# Patient Record
Sex: Female | Born: 1950
Health system: Southern US, Community
[De-identification: ages and names within clinical notes are randomized; demographics above are authoritative.]

## PROBLEM LIST (undated history)

## (undated) ENCOUNTER — Emergency Department (HOSPITAL_COMMUNITY): Discharge status: Absent without leave | Payer: Medicare Other

## (undated) DIAGNOSIS — F32A Depression, unspecified: Secondary | ICD-10-CM

## (undated) DIAGNOSIS — E119 Type 2 diabetes mellitus without complications: Secondary | ICD-10-CM

## (undated) DIAGNOSIS — F329 Major depressive disorder, single episode, unspecified: Secondary | ICD-10-CM

## (undated) DIAGNOSIS — R7989 Other specified abnormal findings of blood chemistry: Secondary | ICD-10-CM

## (undated) DIAGNOSIS — K589 Irritable bowel syndrome without diarrhea: Secondary | ICD-10-CM

## (undated) DIAGNOSIS — E785 Hyperlipidemia, unspecified: Secondary | ICD-10-CM

## (undated) DIAGNOSIS — O24919 Unspecified diabetes mellitus in pregnancy, unspecified trimester: Secondary | ICD-10-CM

## (undated) DIAGNOSIS — F319 Bipolar disorder, unspecified: Secondary | ICD-10-CM

## (undated) DIAGNOSIS — R945 Abnormal results of liver function studies: Secondary | ICD-10-CM

## (undated) DIAGNOSIS — I10 Essential (primary) hypertension: Secondary | ICD-10-CM

## (undated) DIAGNOSIS — F419 Anxiety disorder, unspecified: Secondary | ICD-10-CM

## (undated) DIAGNOSIS — D649 Anemia, unspecified: Secondary | ICD-10-CM

## (undated) DIAGNOSIS — K219 Gastro-esophageal reflux disease without esophagitis: Secondary | ICD-10-CM

## (undated) HISTORY — DX: Unspecified diabetes mellitus in pregnancy, unspecified trimester: O24.919

## (undated) HISTORY — DX: Hyperlipidemia, unspecified: E78.5

## (undated) HISTORY — DX: Gastro-esophageal reflux disease without esophagitis: K21.9

## (undated) HISTORY — DX: Bipolar disorder, unspecified: F31.9

## (undated) HISTORY — DX: Major depressive disorder, single episode, unspecified: F32.9

## (undated) HISTORY — DX: Anxiety disorder, unspecified: F41.9

## (undated) HISTORY — DX: Abnormal results of liver function studies: R94.5

## (undated) HISTORY — DX: Anemia, unspecified: D64.9

## (undated) HISTORY — PX: CHOLECYSTECTOMY: SHX55

## (undated) HISTORY — DX: Other specified abnormal findings of blood chemistry: R79.89

## (undated) HISTORY — DX: Irritable bowel syndrome, unspecified: K58.9

## (undated) HISTORY — DX: Depression, unspecified: F32.A

## (undated) HISTORY — DX: Essential (primary) hypertension: I10

---

## 1999-10-28 ENCOUNTER — Ambulatory Visit (HOSPITAL_COMMUNITY): Admission: RE | Admit: 1999-10-28 | Discharge: 1999-10-28 | Payer: Self-pay | Admitting: *Deleted

## 1999-12-05 ENCOUNTER — Encounter: Payer: Self-pay | Admitting: Family Medicine

## 1999-12-05 ENCOUNTER — Ambulatory Visit (HOSPITAL_COMMUNITY): Admission: RE | Admit: 1999-12-05 | Discharge: 1999-12-05 | Payer: Self-pay | Admitting: Family Medicine

## 2001-08-05 ENCOUNTER — Encounter: Admission: RE | Admit: 2001-08-05 | Discharge: 2001-11-03 | Payer: Self-pay | Admitting: Family Medicine

## 2001-09-06 ENCOUNTER — Encounter: Payer: Self-pay | Admitting: Family Medicine

## 2001-09-06 ENCOUNTER — Ambulatory Visit (HOSPITAL_COMMUNITY): Admission: RE | Admit: 2001-09-06 | Discharge: 2001-09-06 | Payer: Self-pay | Admitting: Family Medicine

## 2001-09-13 ENCOUNTER — Ambulatory Visit (HOSPITAL_COMMUNITY): Admission: RE | Admit: 2001-09-13 | Discharge: 2001-09-13 | Payer: Self-pay | Admitting: Internal Medicine

## 2001-09-13 ENCOUNTER — Encounter: Payer: Self-pay | Admitting: Internal Medicine

## 2001-09-23 ENCOUNTER — Encounter: Payer: Self-pay | Admitting: Internal Medicine

## 2001-09-23 ENCOUNTER — Ambulatory Visit (HOSPITAL_COMMUNITY): Admission: RE | Admit: 2001-09-23 | Discharge: 2001-09-23 | Payer: Self-pay | Admitting: Internal Medicine

## 2001-11-14 ENCOUNTER — Encounter: Payer: Self-pay | Admitting: Surgery

## 2001-11-18 ENCOUNTER — Encounter (INDEPENDENT_AMBULATORY_CARE_PROVIDER_SITE_OTHER): Payer: Self-pay | Admitting: *Deleted

## 2001-11-18 ENCOUNTER — Ambulatory Visit (HOSPITAL_COMMUNITY): Admission: RE | Admit: 2001-11-18 | Discharge: 2001-11-19 | Payer: Self-pay | Admitting: Surgery

## 2001-11-18 ENCOUNTER — Encounter: Payer: Self-pay | Admitting: Surgery

## 2002-03-14 ENCOUNTER — Ambulatory Visit (HOSPITAL_COMMUNITY): Admission: RE | Admit: 2002-03-14 | Discharge: 2002-03-14 | Payer: Self-pay | Admitting: *Deleted

## 2002-03-29 ENCOUNTER — Encounter: Payer: Self-pay | Admitting: Cardiology

## 2002-03-29 ENCOUNTER — Encounter: Admission: RE | Admit: 2002-03-29 | Discharge: 2002-03-29 | Payer: Self-pay | Admitting: Cardiology

## 2002-04-18 ENCOUNTER — Ambulatory Visit (HOSPITAL_BASED_OUTPATIENT_CLINIC_OR_DEPARTMENT_OTHER): Admission: RE | Admit: 2002-04-18 | Discharge: 2002-04-18 | Payer: Self-pay | Admitting: *Deleted

## 2002-04-19 ENCOUNTER — Ambulatory Visit (HOSPITAL_COMMUNITY): Admission: RE | Admit: 2002-04-19 | Discharge: 2002-04-19 | Payer: Self-pay | Admitting: *Deleted

## 2002-12-20 ENCOUNTER — Other Ambulatory Visit: Admission: RE | Admit: 2002-12-20 | Discharge: 2002-12-20 | Payer: Self-pay | Admitting: Obstetrics and Gynecology

## 2003-02-16 ENCOUNTER — Encounter: Payer: Self-pay | Admitting: Internal Medicine

## 2003-04-06 ENCOUNTER — Encounter (INDEPENDENT_AMBULATORY_CARE_PROVIDER_SITE_OTHER): Payer: Self-pay | Admitting: Specialist

## 2003-04-06 ENCOUNTER — Ambulatory Visit (HOSPITAL_COMMUNITY): Admission: RE | Admit: 2003-04-06 | Discharge: 2003-04-06 | Payer: Self-pay | Admitting: Internal Medicine

## 2003-04-06 ENCOUNTER — Encounter: Payer: Self-pay | Admitting: Internal Medicine

## 2003-08-27 ENCOUNTER — Encounter: Payer: Self-pay | Admitting: Emergency Medicine

## 2003-08-27 ENCOUNTER — Inpatient Hospital Stay (HOSPITAL_COMMUNITY): Admission: EM | Admit: 2003-08-27 | Discharge: 2003-08-30 | Payer: Self-pay | Admitting: Emergency Medicine

## 2003-08-27 ENCOUNTER — Encounter: Payer: Self-pay | Admitting: Internal Medicine

## 2003-11-17 HISTORY — PX: GASTRIC BYPASS: SHX52

## 2004-05-03 ENCOUNTER — Ambulatory Visit (HOSPITAL_COMMUNITY): Admission: RE | Admit: 2004-05-03 | Discharge: 2004-05-03 | Payer: Self-pay | Admitting: Hematology & Oncology

## 2004-06-04 ENCOUNTER — Ambulatory Visit (HOSPITAL_COMMUNITY): Admission: RE | Admit: 2004-06-04 | Discharge: 2004-06-04 | Payer: Self-pay | Admitting: Neurology

## 2004-10-23 ENCOUNTER — Ambulatory Visit (HOSPITAL_COMMUNITY): Admission: RE | Admit: 2004-10-23 | Discharge: 2004-10-23 | Payer: Self-pay | Admitting: Family Medicine

## 2004-12-16 ENCOUNTER — Other Ambulatory Visit: Admission: RE | Admit: 2004-12-16 | Discharge: 2004-12-16 | Payer: Self-pay | Admitting: *Deleted

## 2005-11-27 ENCOUNTER — Ambulatory Visit: Payer: Self-pay | Admitting: Cardiology

## 2006-01-01 ENCOUNTER — Ambulatory Visit (HOSPITAL_COMMUNITY): Admission: RE | Admit: 2006-01-01 | Discharge: 2006-01-01 | Payer: Self-pay | Admitting: Neurology

## 2006-03-16 ENCOUNTER — Ambulatory Visit: Payer: Self-pay | Admitting: Hematology & Oncology

## 2006-04-09 LAB — CBC & DIFF AND RETIC
BASO%: 0.8 % (ref 0.0–2.0)
Basophils Absolute: 0.1 10*3/uL (ref 0.0–0.1)
EOS%: 2.1 % (ref 0.0–7.0)
Eosinophils Absolute: 0.2 10*3/uL (ref 0.0–0.5)
HCT: 33.6 % — ABNORMAL LOW (ref 34.8–46.6)
HGB: 10.8 g/dL — ABNORMAL LOW (ref 11.6–15.9)
IRF: 0.38 — ABNORMAL HIGH (ref 0.130–0.330)
LYMPH%: 22.2 % (ref 14.0–48.0)
MCH: 25.7 pg — ABNORMAL LOW (ref 26.0–34.0)
MCHC: 32.2 g/dL (ref 32.0–36.0)
MCV: 79.9 fL — ABNORMAL LOW (ref 81.0–101.0)
MONO#: 0.5 10*3/uL (ref 0.1–0.9)
MONO%: 4.8 % (ref 0.0–13.0)
NEUT#: 7 10*3/uL — ABNORMAL HIGH (ref 1.5–6.5)
NEUT%: 70.1 % (ref 39.6–76.8)
Platelets: 454 10*3/uL — ABNORMAL HIGH (ref 145–400)
RBC: 4.21 10*6/uL (ref 3.70–5.32)
RDW: 23.5 % — ABNORMAL HIGH (ref 11.3–14.5)
RETIC #: 73.3 10*3/uL (ref 19.7–115.1)
Retic %: 1.7 % (ref 0.4–2.3)
WBC: 10 10*3/uL (ref 3.9–10.0)
lymph#: 2.2 10*3/uL (ref 0.9–3.3)

## 2006-04-09 LAB — CHCC SMEAR

## 2006-04-12 LAB — TRANSFERRIN RECEPTOR, SOLUABLE: Transferrin Receptor, Soluble: 9.3 mg/L — ABNORMAL HIGH (ref 1.9–4.4)

## 2006-04-12 LAB — FERRITIN: Ferritin: 259 ng/mL (ref 10–291)

## 2006-05-13 ENCOUNTER — Ambulatory Visit: Payer: Self-pay | Admitting: Hematology & Oncology

## 2006-05-17 ENCOUNTER — Ambulatory Visit (HOSPITAL_COMMUNITY): Admission: RE | Admit: 2006-05-17 | Discharge: 2006-05-17 | Payer: Self-pay | Admitting: Neurology

## 2006-05-21 LAB — CBC WITH DIFFERENTIAL/PLATELET
BASO%: 0.5 % (ref 0.0–2.0)
Basophils Absolute: 0.1 10*3/uL (ref 0.0–0.1)
EOS%: 2.3 % (ref 0.0–7.0)
Eosinophils Absolute: 0.2 10*3/uL (ref 0.0–0.5)
HCT: 35.5 % (ref 34.8–46.6)
HGB: 11.7 g/dL (ref 11.6–15.9)
LYMPH%: 17.3 % (ref 14.0–48.0)
MCH: 27.3 pg (ref 26.0–34.0)
MCHC: 32.9 g/dL (ref 32.0–36.0)
MCV: 82.8 fL (ref 81.0–101.0)
MONO#: 0.6 10*3/uL (ref 0.1–0.9)
MONO%: 5.3 % (ref 0.0–13.0)
NEUT#: 7.9 10*3/uL — ABNORMAL HIGH (ref 1.5–6.5)
NEUT%: 74.6 % (ref 39.6–76.8)
Platelets: 352 10*3/uL (ref 145–400)
RBC: 4.29 10*6/uL (ref 3.70–5.32)
RDW: 19.3 % — ABNORMAL HIGH (ref 11.3–14.5)
WBC: 10.6 10*3/uL — ABNORMAL HIGH (ref 3.9–10.0)
lymph#: 1.8 10*3/uL (ref 0.9–3.3)

## 2006-05-21 LAB — CHCC SMEAR

## 2006-05-24 LAB — FERRITIN: Ferritin: 166 ng/mL (ref 10–291)

## 2006-05-24 LAB — TRANSFERRIN RECEPTOR, SOLUABLE: Transferrin Receptor, Soluble: 4.8 mg/L — ABNORMAL HIGH (ref 1.9–4.4)

## 2006-06-21 LAB — CBC WITH DIFFERENTIAL/PLATELET
BASO%: 0.4 % (ref 0.0–2.0)
Basophils Absolute: 0 10*3/uL (ref 0.0–0.1)
EOS%: 3.6 % (ref 0.0–7.0)
Eosinophils Absolute: 0.3 10*3/uL (ref 0.0–0.5)
HCT: 35.7 % (ref 34.8–46.6)
HGB: 11.8 g/dL (ref 11.6–15.9)
LYMPH%: 24.1 % (ref 14.0–48.0)
MCH: 27.9 pg (ref 26.0–34.0)
MCHC: 33.1 g/dL (ref 32.0–36.0)
MCV: 84.4 fL (ref 81.0–101.0)
MONO#: 0.5 10*3/uL (ref 0.1–0.9)
MONO%: 5.6 % (ref 0.0–13.0)
NEUT#: 6.4 10*3/uL (ref 1.5–6.5)
NEUT%: 66.3 % (ref 39.6–76.8)
Platelets: 353 10*3/uL (ref 145–400)
RBC: 4.23 10*6/uL (ref 3.70–5.32)
RDW: 14.6 % — ABNORMAL HIGH (ref 11.3–14.5)
WBC: 9.6 10*3/uL (ref 3.9–10.0)
lymph#: 2.3 10*3/uL (ref 0.9–3.3)

## 2006-06-21 LAB — CHCC SMEAR

## 2006-06-23 LAB — FERRITIN: Ferritin: 89 ng/mL (ref 10–291)

## 2006-06-23 LAB — TRANSFERRIN RECEPTOR, SOLUABLE: Transferrin Receptor, Soluble: 5.1 mg/L — ABNORMAL HIGH (ref 1.9–4.4)

## 2006-06-30 LAB — UIFE/LIGHT CHAINS/TP QN, 24-HR UR
Albumin, U: DETECTED
Free Kappa Lt Chains,Ur: 2.7 mg/dL — ABNORMAL HIGH (ref 0.04–1.51)
Free Kappa/Lambda Ratio: 10.8 ratio — ABNORMAL HIGH (ref 0.46–4.00)
Free Lambda Excretion/Day: 6.25 mg/d
Free Lambda Lt Chains,Ur: 0.25 mg/dL (ref 0.08–1.01)
Free Lt Chn Excr Rate: 67.5 mg/d
Time: 24 hours
Total Protein, Urine-Ur/day: 80 mg/d (ref 10–140)
Total Protein, Urine: 3.2 mg/dL
Volume, Urine: 2500 mL

## 2006-06-30 LAB — PORPHYRINS, FRACTIONATED URINE (TIMED COLLECTION)
Coproporphyrin, U: 3 (ref 0–22)
Creatinine, Urine mg/day-PORPF: 1150 mg/d (ref 500–1400)
Creatinine, Urine-mg/dL-PORPF: 46 mg/dL
Heptacarboxyl Porphyrin, 24H Ur: 0 (ref 0–2)
Porphyrins Interpretation: NORMAL
Time-PORPF: 24 hr
Total Volume, Urine: 2500 mL
Uroporphyrin, Urine: 1 (ref 0–4)

## 2006-07-20 ENCOUNTER — Ambulatory Visit: Payer: Self-pay | Admitting: Hematology & Oncology

## 2006-07-30 ENCOUNTER — Ambulatory Visit (HOSPITAL_COMMUNITY): Admission: RE | Admit: 2006-07-30 | Discharge: 2006-07-30 | Payer: Self-pay | Admitting: Hematology & Oncology

## 2006-07-30 ENCOUNTER — Ambulatory Visit: Payer: Self-pay | Admitting: Hematology & Oncology

## 2006-07-30 ENCOUNTER — Encounter (INDEPENDENT_AMBULATORY_CARE_PROVIDER_SITE_OTHER): Payer: Self-pay | Admitting: Specialist

## 2006-08-30 ENCOUNTER — Emergency Department (HOSPITAL_COMMUNITY): Admission: EM | Admit: 2006-08-30 | Discharge: 2006-08-30 | Payer: Self-pay | Admitting: Emergency Medicine

## 2006-09-16 ENCOUNTER — Ambulatory Visit: Payer: Self-pay | Admitting: Hematology & Oncology

## 2006-09-24 LAB — CBC & DIFF AND RETIC
BASO%: 0.4 % (ref 0.0–2.0)
Basophils Absolute: 0 10*3/uL (ref 0.0–0.1)
EOS%: 2.5 % (ref 0.0–7.0)
Eosinophils Absolute: 0.2 10*3/uL (ref 0.0–0.5)
HCT: 36.1 % (ref 34.8–46.6)
HGB: 11.9 g/dL (ref 11.6–15.9)
IRF: 0.32 (ref 0.130–0.330)
LYMPH%: 14.1 % (ref 14.0–48.0)
MCH: 28.6 pg (ref 26.0–34.0)
MCHC: 33 g/dL (ref 32.0–36.0)
MCV: 87 fL (ref 81.0–101.0)
MONO#: 0.5 10*3/uL (ref 0.1–0.9)
MONO%: 5.6 % (ref 0.0–13.0)
NEUT#: 6.4 10*3/uL (ref 1.5–6.5)
NEUT%: 77.4 % — ABNORMAL HIGH (ref 39.6–76.8)
Platelets: 292 10*3/uL (ref 145–400)
RBC: 4.15 10*6/uL (ref 3.70–5.32)
RDW: 12.8 % (ref 11.3–14.5)
RETIC #: 39.4 10*3/uL (ref 19.7–115.1)
Retic %: 1 % (ref 0.4–2.3)
WBC: 8.2 10*3/uL (ref 3.9–10.0)
lymph#: 1.2 10*3/uL (ref 0.9–3.3)

## 2006-09-24 LAB — CHCC SMEAR

## 2006-09-27 LAB — FERRITIN: Ferritin: 27 ng/mL (ref 10–291)

## 2006-09-27 LAB — TRANSFERRIN RECEPTOR, SOLUABLE: Transferrin Receptor, Soluble: 5.6 mg/L — ABNORMAL HIGH (ref 1.9–4.4)

## 2006-11-15 ENCOUNTER — Ambulatory Visit: Payer: Self-pay | Admitting: Hematology & Oncology

## 2007-04-21 ENCOUNTER — Ambulatory Visit: Payer: Self-pay | Admitting: Hematology & Oncology

## 2007-05-30 ENCOUNTER — Emergency Department (HOSPITAL_COMMUNITY): Admission: EM | Admit: 2007-05-30 | Discharge: 2007-05-30 | Payer: Self-pay | Admitting: Emergency Medicine

## 2007-06-16 ENCOUNTER — Encounter: Admission: RE | Admit: 2007-06-16 | Discharge: 2007-06-16 | Payer: Self-pay | Admitting: Family Medicine

## 2007-08-03 ENCOUNTER — Ambulatory Visit: Payer: Self-pay | Admitting: Hematology & Oncology

## 2007-09-21 ENCOUNTER — Ambulatory Visit: Payer: Self-pay | Admitting: Hematology & Oncology

## 2007-09-26 LAB — CBC WITH DIFFERENTIAL/PLATELET
BASO%: 0.2 % (ref 0.0–2.0)
Basophils Absolute: 0 10*3/uL (ref 0.0–0.1)
EOS%: 1.3 % (ref 0.0–7.0)
Eosinophils Absolute: 0.1 10*3/uL (ref 0.0–0.5)
HCT: 24.8 % — ABNORMAL LOW (ref 34.8–46.6)
HGB: 7.8 g/dL — ABNORMAL LOW (ref 11.6–15.9)
LYMPH%: 16.4 % (ref 14.0–48.0)
MCH: 20.1 pg — ABNORMAL LOW (ref 26.0–34.0)
MCHC: 31.2 g/dL — ABNORMAL LOW (ref 32.0–36.0)
MCV: 64.4 fL — ABNORMAL LOW (ref 81.0–101.0)
MONO#: 0.5 10*3/uL (ref 0.1–0.9)
MONO%: 5.4 % (ref 0.0–13.0)
NEUT#: 7.6 10*3/uL — ABNORMAL HIGH (ref 1.5–6.5)
NEUT%: 76.7 % (ref 39.6–76.8)
Platelets: 511 10*3/uL — ABNORMAL HIGH (ref 145–400)
RBC: 3.86 10*6/uL (ref 3.70–5.32)
RDW: 17.3 % — ABNORMAL HIGH (ref 11.3–14.5)
WBC: 9.9 10*3/uL (ref 3.9–10.0)
lymph#: 1.6 10*3/uL (ref 0.9–3.3)

## 2007-09-26 LAB — CHCC SMEAR

## 2007-09-28 LAB — TRANSFERRIN RECEPTOR, SOLUABLE: Transferrin Receptor, Soluble: 111.5 nmol/L

## 2007-09-28 LAB — FERRITIN: Ferritin: 1 ng/mL — ABNORMAL LOW (ref 10–291)

## 2007-10-05 LAB — KAPPA/LAMBDA LIGHT CHAINS
Kappa free light chain: 0.91 mg/dL (ref 0.33–1.94)
Kappa:Lambda Ratio: 0.59 (ref 0.26–1.65)
Lambda Free Lght Chn: 1.54 mg/dL (ref 0.57–2.63)

## 2007-10-17 LAB — VON WILLEBRAND PANEL
Factor-VIII Activity: 127 % (ref 75–150)
Ristocetin-Cofactor: 137 % (ref 50–150)
Von Willebrand Ag: 131 % normal (ref 61–164)

## 2007-11-08 ENCOUNTER — Ambulatory Visit: Payer: Self-pay | Admitting: Hematology & Oncology

## 2008-02-23 ENCOUNTER — Ambulatory Visit (HOSPITAL_COMMUNITY): Admission: RE | Admit: 2008-02-23 | Discharge: 2008-02-23 | Payer: Self-pay | Admitting: Family Medicine

## 2008-04-16 ENCOUNTER — Emergency Department (HOSPITAL_COMMUNITY): Admission: EM | Admit: 2008-04-16 | Discharge: 2008-04-16 | Payer: Self-pay | Admitting: Emergency Medicine

## 2008-08-02 ENCOUNTER — Ambulatory Visit: Payer: Self-pay | Admitting: Hematology & Oncology

## 2008-08-03 LAB — CBC WITH DIFFERENTIAL (CANCER CENTER ONLY)
BASO#: 0.1 10*3/uL (ref 0.0–0.2)
BASO%: 0.8 % (ref 0.0–2.0)
EOS%: 2.3 % (ref 0.0–7.0)
Eosinophils Absolute: 0.2 10*3/uL (ref 0.0–0.5)
HCT: 40.9 % (ref 34.8–46.6)
HGB: 13.6 g/dL (ref 11.6–15.9)
LYMPH#: 1.7 10*3/uL (ref 0.9–3.3)
LYMPH%: 15.7 % (ref 14.0–48.0)
MCH: 28.6 pg (ref 26.0–34.0)
MCHC: 33.1 g/dL (ref 32.0–36.0)
MCV: 86 fL (ref 81–101)
MONO#: 0.6 10*3/uL (ref 0.1–0.9)
MONO%: 5.5 % (ref 0.0–13.0)
NEUT#: 8 10*3/uL — ABNORMAL HIGH (ref 1.5–6.5)
NEUT%: 75.7 % (ref 39.6–80.0)
Platelets: 301 10*3/uL (ref 145–400)
RBC: 4.74 10*6/uL (ref 3.70–5.32)
RDW: 12.6 % (ref 10.5–14.6)
WBC: 10.6 10*3/uL — ABNORMAL HIGH (ref 3.9–10.0)

## 2008-08-03 LAB — CHCC SATELLITE - SMEAR

## 2008-08-07 LAB — FERRITIN: Ferritin: 118 ng/mL (ref 10–291)

## 2008-08-07 LAB — RETICULOCYTES (CHCC)
ABS Retic: 55.1 10*3/uL (ref 19.0–186.0)
RBC.: 4.59 MIL/uL (ref 3.87–5.11)
Retic Ct Pct: 1.2 % (ref 0.4–3.1)

## 2008-08-07 LAB — TRANSFERRIN RECEPTOR, SOLUABLE: Transferrin Receptor, Soluble: 34 nmol/L

## 2008-09-26 ENCOUNTER — Ambulatory Visit: Payer: Self-pay

## 2008-09-26 ENCOUNTER — Ambulatory Visit: Payer: Self-pay | Admitting: Cardiology

## 2009-04-01 ENCOUNTER — Emergency Department (HOSPITAL_COMMUNITY): Admission: EM | Admit: 2009-04-01 | Discharge: 2009-04-01 | Payer: Self-pay | Admitting: Emergency Medicine

## 2009-08-13 ENCOUNTER — Telehealth: Payer: Self-pay | Admitting: Internal Medicine

## 2009-08-15 DIAGNOSIS — E785 Hyperlipidemia, unspecified: Secondary | ICD-10-CM | POA: Insufficient documentation

## 2009-08-15 DIAGNOSIS — K5900 Constipation, unspecified: Secondary | ICD-10-CM | POA: Insufficient documentation

## 2009-08-15 DIAGNOSIS — E1169 Type 2 diabetes mellitus with other specified complication: Secondary | ICD-10-CM | POA: Insufficient documentation

## 2009-08-15 DIAGNOSIS — K589 Irritable bowel syndrome without diarrhea: Secondary | ICD-10-CM | POA: Insufficient documentation

## 2009-08-15 DIAGNOSIS — K219 Gastro-esophageal reflux disease without esophagitis: Secondary | ICD-10-CM | POA: Insufficient documentation

## 2009-08-15 DIAGNOSIS — I10 Essential (primary) hypertension: Secondary | ICD-10-CM | POA: Insufficient documentation

## 2009-08-26 ENCOUNTER — Ambulatory Visit: Payer: Self-pay | Admitting: Internal Medicine

## 2009-08-26 DIAGNOSIS — F3170 Bipolar disorder, currently in remission, most recent episode unspecified: Secondary | ICD-10-CM | POA: Insufficient documentation

## 2009-08-26 DIAGNOSIS — F411 Generalized anxiety disorder: Secondary | ICD-10-CM | POA: Insufficient documentation

## 2009-08-26 DIAGNOSIS — G43909 Migraine, unspecified, not intractable, without status migrainosus: Secondary | ICD-10-CM | POA: Insufficient documentation

## 2009-08-30 ENCOUNTER — Encounter: Payer: Self-pay | Admitting: Internal Medicine

## 2009-08-30 ENCOUNTER — Ambulatory Visit: Payer: Self-pay | Admitting: Internal Medicine

## 2009-09-05 ENCOUNTER — Encounter: Payer: Self-pay | Admitting: Internal Medicine

## 2010-03-31 ENCOUNTER — Emergency Department (HOSPITAL_COMMUNITY): Admission: EM | Admit: 2010-03-31 | Discharge: 2010-03-31 | Payer: Self-pay | Admitting: Emergency Medicine

## 2010-09-11 ENCOUNTER — Encounter: Admission: RE | Admit: 2010-09-11 | Discharge: 2010-09-11 | Payer: Self-pay | Admitting: Family Medicine

## 2010-12-07 ENCOUNTER — Encounter: Payer: Self-pay | Admitting: Family Medicine

## 2010-12-07 ENCOUNTER — Encounter: Payer: Self-pay | Admitting: Neurology

## 2010-12-18 NOTE — Miscellaneous (Signed)
Summary: Reglan and Nexium Rx  Clinical Lists Changes  Medications: Added new medication of NEXIUM 40 MG  CPDR (ESOMEPRAZOLE MAGNESIUM) 1 capsule each day 30 minutes before meal - Signed Added new medication of REGLAN 5 MG  TABS (METOCLOPRAMIDE HCL) take 1 tablet before each meal - Signed Rx of NEXIUM 40 MG  CPDR (ESOMEPRAZOLE MAGNESIUM) 1 capsule each day 30 minutes before meal;  #30 x 5;  Signed;  Entered by: Burman Foster RN;  Authorized by: Hart Carwin MD;  Method used: Electron[facility] to [facility] [phone]*, [address] [place], [place], [place]  [address], Ph: [phone] or [phone], Fax: [phone] Rx of REGLAN 5 MG  TABS (METOCLOPRAMIDE HCL) take 1 tablet before each meal;  #90 x 1;  Signed;  Entered by: Burman Foster RN;  Authorized by: Hart Carwin MD;  Method used: Electronically to [facility] [phone]*, [address], [place], [place], [place]  [address], Ph: [phone] or [phone], Fax: [phone]    Prescriptions: REGLAN 5 MG  TABS (METOCLOPRAMIDE HCL) take 1 tablet before each meal  #90 x 1   Entered by:   Burman Foster RN   Authorized by:   Hart Carwin MD   Signed by:   Burman Foster RN on 08/30/2009   Method used:   Electronically to        [facility] [phone]* (retail)       [address]       [place], [place]  [address]       Ph: [phone] or [phone]       Fax: [phone]   RxID:   [id number] NEXIUM 40 MG  CPDR (ESOMEPRAZOLE MAGNESIUM) 1 capsule each day 30 minutes before meal  #30 x 5   Entered by:   Burman Foster RN   Authorized by:   Hart Carwin MD   Signed by:   Burman Foster RN on 08/30/2009   Method used:   Electronically to        [facility] [phone]* (retail)       [address]       [place], [place]  [address]       Ph: [phone] or [phone]       Fax: [phone]   RxID:   [id number]

## 2010-12-18 NOTE — Progress Notes (Signed)
Summary: TRIAGE  Phone Note Call from Patient Call back at Home Phone [phone]   Caller: Patient Call For: Dr. Juanda Chance Reason for Call: Talk to Nurse Summary of Call: pt was last seen by Dr. Juanda Chance in 2004 per MedMgr... pt reporting dysphagia and regurgitation... would like to be seen asap Initial call taken by: Vallarie Mare,  August 13, 2009 3:21 PM  Follow-up for Phone Call        Pt. c/o immediate regurgitation after some meals, at least 4X weekly, also bloating and increased belching. Pt. takes MOM daily for constipation. Denies dysphagia, pain, bleeding, black stools. Had a Gastric Bypass in 2005.  Pt. will see Dr.Cristle Jared on 08-21-09 at 9am. Pt. instructed to call back as needed.  Follow-up by: Laureen Ochs LPN,  August 13, 2009 3:39 PM

## 2010-12-18 NOTE — Assessment & Plan Note (Signed)
Summary: REGURGITATION AFTER MEALS, CONSTIPATION         Erin Donaldson   History of Present Illness Visit Type: Initial Consult Primary GI MD: Erin Sar MD Primary Provider: Rudi Heap, MD Chief Complaint: Patient complains of mid abdominal pain that started about a year ago that comes and goes. Nausea and vomiting started around the same time patient states that it happens when she eats too much, if she eats more than a cup and a half of food.  History of Present Illness:   The patient is a 60 year old white female whom we have seen in the past for post-cholecystectomy diarrhea. We saw her prior to that for symptoms of upper abdominal pain and iron-deficiency anemia. An endoscopy was done in 2002 for evaluation of the anemia. That showed bile reflux. Patient also had a colonoscopy in May 2004 for evaluation of her diarrhea. The colonoscopy was normal to the cecum. Random biopsies were completed and benign colonic mucosa. There was no significant inflammation or other abnormalifies identified.she had a gastric stapling procedure in 2005 and did well oozing about 130 pounds. She has gained about 20 pounds back. Recently she was put on psychotropic medications for bipolar disorder. She has developed regurgitation of the food. Prilosec didn't seem to help   GI Review of Systems    Reports abdominal pain, bloating, nausea, and  vomiting.     Location of  Abdominal pain: generalized.     Reports change in bowel habits and  constipation.    Preventive Screening-Counseling & Management  Alcohol-Tobacco     Smoking Status: never      Drug Use:  no.      Current Medications (verified): 1)  Exelon 4.6 Mg/24hr Pt24 (Rivastigmine) .... Change Patch Daily 2)  Emsam 9 Mg/24hr Pt24 (Selegiline) .... Change Daily 3)  Lamotrigine 200 Mg Tabs (Lamotrigine) .... Take One By Mouth Once Daily 4)  Benztropine Mesylate 1 Mg Tabs (Benztropine Mesylate) .... Take One By Mouth Two Times A Day 5)  Topamax 50 Mg  Tabs (Topiramate) .... Take One By Mouth Two Times A Day 6)  Furosemide 20 Mg Tabs (Furosemide) .... Take One By Mouth Once Daily 7)  Hydroxyzine Hcl 10 Mg Tabs (Hydroxyzine Hcl) .... Take 1-2 By Mouth Three Times A Day 8)  Chlordiazepoxide Hcl 10 Mg Caps (Chlordiazepoxide Hcl) .... Take One By Mouth Three Times A Day 9)  Seroquel 200 Mg Tabs (Quetiapine Fumarate) .... Taje 2 Tabs By Mouth At Bedtime 10)  Gabapentin 300 Mg Caps (Gabapentin) .... Take 3 Caps By Mouth Three Times A Day 11)  Namenda 10 Mg Tabs (Memantine Hcl) .... Take One By Mouth Two Times A Day 12)  Maxalt-Mlt 10 Mg Tbdp (Rizatriptan Benzoate) .... Take One By Mouth Then Take One 30 Min Later If Headaches Are No Better 13)  Lithium Carbonate 150 Mg Caps (Lithium Carbonate) .... Take One By Mouth Once Daily  Allergies (verified): No Known Drug Allergies  Past History:  Past Medical History: Current Problems:  MIGRAINE HEADACHE (ICD-346.90) BIPOLAR AFFECTIVE DISORDER (ICD-296.80) ANXIETY (ICD-300.00) HYPERLIPIDEMIA (ICD-272.4) HYPERTENSION (ICD-401.9) GERD (ICD-530.81) DIABETES MELLITUS, TYPE II (ICD-250.00) IRRITABLE BOWEL SYNDROME (ICD-564.1) CONSTIPATION (ICD-564.00)  Past Surgical History: Cholecystectomy Gastric Bypass 2005  Family History: Family History of Kidney Disease: Father Family History of Heart Disease: Father, Mother, Maternal Grandfather, Brother, maternal auns and uncles, paternal aunts and uncles. Family History of Breast Cancer: Mother lymphnode under arm No FH of Colon Cancer: Family History of Diabetes: father, maternal aunts and uncles,  paternal aunts and uncles Family istory of Bladder Cancer: Mother Died  Social History: Alcohol Use - yes-rare Illicit Drug Use - no Daily Caffeine Use Patient has never smoked.  Smoking Status:  never  Review of Systems       The patient complains of anxiety-new, confusion, depression-new, headaches-new, and sleeping problems.  The patient denies  allergy/sinus, anemia, arthritis/joint pain, back pain, blood in urine, breast changes/lumps, change in vision, cough, coughing up blood, fainting, fatigue, fever, hearing problems, heart murmur, heart rhythm changes, itching, menstrual pain, muscle pains/cramps, night sweats, nosebleeds, pregnancy symptoms, shortness of breath, skin rash, sore throat, swelling of feet/legs, swollen lymph glands, thirst - excessive , urination - excessive , urination changes/pain, urine leakage, vision changes, and voice change.         Pertinent positive and negative review of systems were noted in the above HPI. All other ROS was otherwise negative.   Vital Signs:  Patient profile:   60 year old female Height:      64 inches Weight:      208 pounds BMI:     35.83 Pulse rate:   104 / minute Pulse rhythm:   regular BP sitting:   120 / 70  (left arm) Cuff size:   regular  Vitals Entered By: Erin Donaldson CMA Duncan Dull) (August 26, 2009 2:11 PM)  Physical Exam  General:  obese alert and oriented Eyes:  PERRLA, no icterus. Mouth:  dry small stone with cheilosis Neck:  Supple; no masses or thyromegaly. Lungs:  Clear throughout to auscultation. Heart:  Regular rate and rhythm; no murmurs, rubs,  or bruits. Abdomen:  large protuberant abdomen tender in epigastrium. Well-healed surgical scar. We'll abdomen unremarkable. No ascites. Bowel sounds normal active Neurologic:  Alert and  oriented x4;  grossly normal neurologically. Skin:  extensive ecchymosis of the forearms Psych:  Alert and cooperative. Normal mood and affect.   Impression & Recommendations:  Problem # 1:  GERD (ICD-530.81) status post gastric stapling procedure 5 years ago now with regurgitation of the food throughout gastroparesis. Rule out gastric outlet obstruction. Psychotropic medication may affect gastric emptying in an adverse way. We will start Nexium 40 mg daily and schedule patient for upper endoscopy. I would consider Reglan in the  future  Problem # 2:  BIPOLAR AFFECTIVE DISORDER (ICD-296.80) patient is some Namenda 10 mg twice a day headache medications, Exelon and Emsam  Patient Instructions: 1)  Nexium 40 mg daily 2)  Upper endoscopy 3)  Small frequent feedings and antireflux measures 4)  Consider adding Reglan 5)  Copy sent to : Dr Vernon Prey  Appended Document: REGURGITATION AFTER MEALS, CONSTIPATION         [facility]    Clinical Lists Changes  Orders: Added new Test order of EGD (EGD) - Signed

## 2010-12-18 NOTE — Procedures (Signed)
Summary: Upper Endoscopy  Patient: [name] Note: All result statuses are Final unless otherwise noted.  Tests: (1) Upper Endoscopy (EGD)   EGD Upper Endoscopy       DONE     [facility]     [address]     [place], [place]  [address]           OPERATIVE PROCEDURE REPORT           PATIENT:  Erin Donaldson, Erin Donaldson  MR#:  [id number]     BIRTHDATE:  04-28-1951  GENDER:  female           ENDOSCOPIST:  Hedwig Morton. Juanda Chance, MD     ASSISTANT:           PROCEDURE DATE:  08/30/2009     PRE-PROCEDURE PREPERATION:     PRE-PROCEDURE PHYSICAL:  Rudi Heap, MD     PROCEDURE:  EGD with biopsy     ASA CLASS:  Class II     INDICATIONS:  1) nausea and vomiting  2) abdominal pain s/p     gastric bypass 5 years ago, lost 136lbs, now unable to keep food     down, pt has been on several psychotropic medications for bipolar     disorder           MEDICATIONS:  Versed 10 mg, Fentanyl 100 mcg     TOPICAL ANESTHETIC:  Exactacain Spray           DESCRIPTION OF PROCEDURE:   After the risks benefits and     alternatives of the procedure were thoroughly explained, informed     consent was obtained.  The LB GIF-H180 [id number] endoscope was     introduced through the mouth and advanced to the proximal jejunum,     without limitations.  The instrument was slowly withdrawn as the     mucosa was fully examined.     <<PROCEDUREIMAGES>>           Post-operative change was noted (see image9, image7, and image4).     gasrojejunal bypass, gastric remnant 38 - 46 cm, 8 cm long, widely     open gastrojejunal anastomosis  An ulcer was found at the     anastomosis. shallow 5 mm linear ulcer at the anastomosis With     standard forceps, a biopsy was obtained and sent to pathology (see     image7, image8, and image9).  Otherwise the examination was     normal. jejunal biopsies to r/o villous atrophy With standard     forceps, a biopsy was obtained and sent to pathology (see image11,     image6, image5, image2,  and image1).    Retroflexed views revealed     no abnormalities.    The scope was then withdrawn from the patient     and the procedure terminated.           COMPLICATIONS:  None           IMPRESSION:  1) Post-operative change     2) Ulcer at the anastomosis     3) Otherwise normal examination     s/p gasrojejunal bypass, 8 cm gastric remnant, wide open     anastomosis     shallow anastomotic ulcer     RECOMMENDATIONS:  1) await pathology results     Nexiem 40 mg po qd     Reglan 5 mg po ac, #90, 1 refill  REPEAT EXAM:  In 0 year(s) for.     DISCHARGE INSTRUCTIONS:           ______________________________     Hedwig Morton. Juanda Chance, MD           CPT CODES:           DIAGNOSIS CODES:           CC:           n.     eSIGNED:   Daksha Koone M. Noela Brothers at 08/30/2009 10:55 AM           Page 2 of 3   Erin Donaldson, Erin Donaldson, [id number]  Note: An exclamation mark (!) indicates a result that was not dispersed into the flowsheet. Document Creation Date: 08/30/2009 10:55 AM _______________________________________________________________________  (1) Order result status: Final Collection or observation date-time: 08/30/2009 10:41 Requested date-time:  Receipt date-time:  Reported date-time:  Referring Physician:   Ordering Physician: Lina Sar [phone]) Specimen Source:  Source: Launa Grill Order Number: [id number] Lab site:

## 2010-12-18 NOTE — Op Note (Signed)
Summary: EGD                         [facility]  Patient:    Erin Donaldson, Erin Donaldson Visit Number: [id number] MRN: [id number]          Service Type: END Location: ENDO Attending Physician:  Mervin Hack Proc. Date: 09/13/01 Admit Date:  09/13/2001   CC:         Monica Becton, M.D.   Procedure Report  PROCEDURE PERFORMED:  Upper endoscopy.  ENDOSCOPIST:  Hedwig Morton. Juanda Chance, M.D. [facility]  INDICATIONS:  This 60 year old female has a history of iron deficiency anemia mostly attributed to menorrhagia.  She has been on ibuprofen and BCs for headaches and has complained of epigastric pain.  Her ultrasound was positive for a solitary stone.  Her epigastric tenderness has persisted on physical exam on 08/30/01.  She is scheduled for upper endoscopy for evaluation of epigastric pain to rule out end-stage related gastropathy.  ENDOSCOPE:  Olympus video endoscope.  SEDATION:  Versed 10 mg IV and IV Demerol 75 mg.  FINDINGS:  The Olympus video endoscope was passed into the esophagus.  The patient was monitored and oxygen saturation were normal.  The proximal and distal esophageal mucosa was unremarkable.  There was normal columnar junction, no evidence of esophagitis, and no hiatal hernia.  The stomach was insufflated with air and showed normal rugal folds.  There was a small amount of bile along the greater curvature of the stomach.  There was minimal erythema in the prepyloric antrum.  Biopsies were taken for CLOtest. The pyloric outlet was normal.  Retroflexion of the endoscope revealed normal sinus and cardia.  The descending duodenum is normal.  IMPRESSION: 1. Bile reflux. 2. Status post CLOtest.  PLAN:  The upper endoscopy findings do not account for patients abdominal pain and dyspepsia, which may be related to poorly functioning gallbladder. We will discuss with patient the possibility of doing a cholecystectomy.  We may need to do a hepatobiliary scan prior to  cholecystectomy to determine whether her gallbladder is functioning. Attending Physician:  Mervin Hack DD:  09/13/01 TD:  09/14/01 Job: [id number]  unable to locate clo test results at this time. Hortense Ramal CMA Duncan Dull)  August 15, 2009 4:58 PM

## 2010-12-18 NOTE — Op Note (Signed)
Summary: COLON    NAME:  Erin Donaldson, Erin Donaldson                         ACCOUNT NO.:  [id number]   MEDICAL RECORD NO.:  [id number]                   PATIENT TYPE:  AMB   LOCATION:  ENDO                                 [facility]   PHYSICIAN:  Lina Sar, M.D. [facility]               DATE OF BIRTH:  1951/10/26   DATE OF PROCEDURE:  DATE OF DISCHARGE:                                 OPERATIVE REPORT   NAME OF PROCEDURE:  Colonoscopy.   INDICATIONS:  This 60 year old white female has a history of post  cholecystectomy diarrhea.  She has been obese and has not lost any weight.  Her stool was hemoccult-negative on physical examination.  She has been also  complaining of diffuse abdominal pain.  The patient has been on Questran 4  mg daily with some improvement of her symptoms.  The diarrhea is persistent  intermittently.  She is undergoing colonoscopy to rule out microscopic  colitis or inflammatory bowel disease.   ENDOSCOPE:  Olympus single channel video endoscope.   SEDATION:  Versed 12.5 mg IV, Demerol 100 mg IV.   FINDINGS:  Olympus single channel video endoscope passed under direct vision  into rectum to sigmoid colon.  The patient was monitored by pulse oximeter.  Her oxygen saturations were normal.  Rectal tone and rectal ampullae were  normal.  There was no perianal disease.  Sigmoid colon mucosa showed normal  submucosal blood vessels.  There were no erosions and nothing to suggest  colitis.  Colonoscope passed easily through the descending colon, splenic  flexure, transverse colon, hepatic flexure to ascending colon to the cecum.  Cecal pouch, ileocecal valve were viewed thoroughly and showed normal valve  and appendiceal opening.  Multiple biopsies were taken from the right colon,  transverse, and left colon as the endoscope was retracted.  Colon was  decompressed.  There were no diverticula or polyps.   IMPRESSION:  Normal colonoscopy to the cecum status post random biopsies  to  rule out microscopic colitis.   PLAN:  Await results of the biopsies.  If they are normal then we will  continue to treat for irritable bowel syndrome and post cholecystectomy  diarrhea with Questran 4 mg daily and Lomotil one at bedtime.  Lomotil could  be increased to two tablets daily depending on severity of her symptoms.                                               Lina Sar, M.D. [facility]    DB/MEDQ  D:  04/06/2003  T:  04/06/2003  Job:  [id number]   cc:   Ernestina Penna, M.D.  [address] [place]  [place] [address]  Fax: [phone]   FINAL DIAGNOSIS    ***MICROSCOPIC EXAMINATION  AND DIAGNOSIS***    COLON, BIOPSY: BENIGN COLONIC MUCOSA. NO SIGNIFICANT   INFLAMMATION OR OTHER ABNORMALITIES IDENTIFIED.    COMMENT   There is colonic mucosa with normal crypt architecture and no   objective increase in inflammation. No active inflammation,   microscopic colitis, collagenous colitis or significant chronic   changes identified. No hyperplastic or adenomatous changes are   seen, and there is no evidence of malignancy.    kv   Date Reported: 04/09/2003 Beulah Gandy. Luisa Hart, MD   *** Electronically Signed Out By JDP ***    Clinical information   R/o microscopic colitis. History of diarrhea. Normal colon.   (ac)    specimen(s) obtained   Colon, biopsy, random    Gross Description   Received in formalin are tan, soft tissue fragments that are   submitted in toto. Number: Multiple   Size: 0.2 to 0.3 cm (GP:caf 04/06/03)    cf/

## 2010-12-18 NOTE — Assessment & Plan Note (Signed)
Summary: Gastroenterology  Erin Donaldson Erin Donaldson Donaldson MR#:  [id number] Page #  [facility]   GASTROENTEROLOGY OFFICE NOTE  NAME:  Erin Donaldson, Erin Donaldson Donaldson  OFFICE NO:  [id number]  DATE:  02/16/03  DOB:  22-Dec-2050  HISTORY OF PRESENT ILLNESS:  The patient is a 60 year old white female who is here for evaluation of diarrhea, which started after a laparoscopic cholecystectomy in January 2003.  We saw her prior to that for symptoms of upper abdominal pain and iron-deficiency anemia.  She had a positive ultrasound of the gallbladder showing cholelithiasis.  Following cholecystectomy she developed diarrhea having 4 to 6 rude bowel movements a day.  She was started on Questran for some time with some improvement.  Overall the diarrhea is somewhat better than initially after the cholecystectomy, but she still has urgent bowel movements which occur during the day but usually not at night.  She has also described passing stools with undigested food.  Despite that she has remained overweight with no evidence of failure to thrive.    PHYSICAL EXAMINATION:  Blood pressure 140/76, pulse 60, and weight 260 pounds.  She was overweight in no distress.  Skin showed no palmar erythema or Dupuytren's contractions.  Lungs were clear to auscultation.  COR with normal S1 and normal S2.  She has large breasts.  Protuberant abdomen, which was tender mostly in the right upper quadrant but also diffusely.   Bowel sounds were active.  Post laparoscopic cholecystectomy scars were well healed.  Exam was limited because of the large size of the abdomen.  Rectal exam showed normal tone.  Stool was soft and Hemoccult negative.       IMPRESSION:  A 60 year old white female with post cholecystectomy diarrhea and irritable bowel syndrome.  Etiology of diarrhea is related to choleretic causes.  Possibly it could also be contributed by irritable bowel syndrome.  Because of her weight status I suspect she has some dietary indiscretion.  She is a diabetic,  and although she does not have any signs of peripheral neuropathy, a possibility of visceral autonomic neuropathy should be considered.  Although she has no hematochezia, inflammatory bowel disease also is a possibility.    PLAN:   1.  Begin Lomotil one at bedtime.        2.  High-fiber diet.   3.  Try to lose weight.   4.  Colonoscopy scheduled.  This will have to be done in the hospital under heavy sedation since the patient has been on high doses of psychotropic agents for some time and she has complete recall of the last endoscopy, which was done on September 13, 2001.  She in the meantime will continue on her other medications, which include Glucophage, Avandia, Lipitor, Lexapro, Actos, and trazodone.  She is also on Lasix 40 mg a day.              Hedwig Morton. Juanda Chance, M.D.  [id number] cc:  Dr. Vernon Prey D:  02/16/03; T:  ; Job [id number]

## 2010-12-18 NOTE — Letter (Signed)
Summary: Patient [name] Biopsy Results  [facility]  [address] [place], [place] [address]   Phone: [phone]  Fax: [phone]        September 05, 2009 MRN: [id number]    Erin Donaldson [address] [place], [place]  [address]    Dear Ms. Vantuyl,  I am pleased to inform you that the biopsies taken during your recent endoscopic examination did not show any evidence of cancer upon pathologic examination.The small ulcer at the anastomosis is benogn.  Additional information/recommendations:  __No further action is needed at this time.  Please follow-up with      your primary care physician for your other healthcare needs.  __ Please call [phone] to schedule a return visit to review      your condition.  _x_ Continue with the treatment plan as outlined on the day of your      exam.  __.   Please call us if you are having persistent problems or have questions about your condition that have not been fully answered at this time.  Sincerely,  Hart Carwin MD  This letter has been electronically signed by your physician.  Appended Document: Patient Notice-Endo Biopsy Results  Letter mailed 10.28.10

## 2011-01-16 ENCOUNTER — Emergency Department (HOSPITAL_COMMUNITY): Payer: Medicare Other

## 2011-01-16 ENCOUNTER — Emergency Department (HOSPITAL_COMMUNITY)
Admission: EM | Admit: 2011-01-16 | Discharge: 2011-01-16 | Discharge status: Against Medical Advice | Payer: Medicare Other | Attending: Emergency Medicine | Admitting: Emergency Medicine

## 2011-01-16 DIAGNOSIS — G309 Alzheimer's disease, unspecified: Secondary | ICD-10-CM | POA: Insufficient documentation

## 2011-01-16 DIAGNOSIS — R4789 Other speech disturbances: Secondary | ICD-10-CM | POA: Insufficient documentation

## 2011-01-16 DIAGNOSIS — R5381 Other malaise: Secondary | ICD-10-CM | POA: Insufficient documentation

## 2011-01-16 DIAGNOSIS — R5383 Other fatigue: Secondary | ICD-10-CM | POA: Insufficient documentation

## 2011-01-16 DIAGNOSIS — R29898 Other symptoms and signs involving the musculoskeletal system: Secondary | ICD-10-CM | POA: Insufficient documentation

## 2011-01-16 DIAGNOSIS — F341 Dysthymic disorder: Secondary | ICD-10-CM | POA: Insufficient documentation

## 2011-01-16 DIAGNOSIS — F29 Unspecified psychosis not due to a substance or known physiological condition: Secondary | ICD-10-CM | POA: Insufficient documentation

## 2011-01-16 DIAGNOSIS — Z79899 Other long term (current) drug therapy: Secondary | ICD-10-CM | POA: Insufficient documentation

## 2011-01-16 DIAGNOSIS — E119 Type 2 diabetes mellitus without complications: Secondary | ICD-10-CM | POA: Insufficient documentation

## 2011-01-16 DIAGNOSIS — I6350 Cerebral infarction due to unspecified occlusion or stenosis of unspecified cerebral artery: Secondary | ICD-10-CM | POA: Insufficient documentation

## 2011-01-16 DIAGNOSIS — F0280 Dementia in other diseases classified elsewhere without behavioral disturbance: Secondary | ICD-10-CM | POA: Insufficient documentation

## 2011-01-16 LAB — DIFFERENTIAL
Basophils Absolute: 0.1 10*3/uL (ref 0.0–0.1)
Basophils Relative: 1 % (ref 0–1)
Eosinophils Absolute: 0.3 10*3/uL (ref 0.0–0.7)
Eosinophils Relative: 4 % (ref 0–5)
Lymphocytes Relative: 19 % (ref 12–46)
Lymphs Abs: 1.5 10*3/uL (ref 0.7–4.0)
Monocytes Absolute: 0.8 10*3/uL (ref 0.1–1.0)
Monocytes Relative: 10 % (ref 3–12)
Neutro Abs: 5.2 10*3/uL (ref 1.7–7.7)
Neutrophils Relative %: 66 % (ref 43–77)

## 2011-01-16 LAB — POCT I-STAT, CHEM 8
BUN: 24 mg/dL — ABNORMAL HIGH (ref 6–23)
Calcium, Ion: 1.01 mmol/L — ABNORMAL LOW (ref 1.12–1.32)
Chloride: 107 mEq/L (ref 96–112)
Creatinine, Ser: 1.3 mg/dL — ABNORMAL HIGH (ref 0.4–1.2)
Glucose, Bld: 89 mg/dL (ref 70–99)
HCT: 33 % — ABNORMAL LOW (ref 36.0–46.0)
Hemoglobin: 11.2 g/dL — ABNORMAL LOW (ref 12.0–15.0)
Potassium: 4.2 mEq/L (ref 3.5–5.1)
Sodium: 136 mEq/L (ref 135–145)
TCO2: 25 mmol/L (ref 0–100)

## 2011-01-16 LAB — CBC
HCT: 36 % (ref 36.0–46.0)
Hemoglobin: 11.4 g/dL — ABNORMAL LOW (ref 12.0–15.0)
MCH: 27.1 pg (ref 26.0–34.0)
MCHC: 31.7 g/dL (ref 30.0–36.0)
MCV: 85.7 fL (ref 78.0–100.0)
Platelets: ADEQUATE 10*3/uL (ref 150–400)
RBC: 4.2 MIL/uL (ref 3.87–5.11)
RDW: 14.3 % (ref 11.5–15.5)
WBC: 7.9 10*3/uL (ref 4.0–10.5)

## 2011-01-16 LAB — GLUCOSE, CAPILLARY: Glucose-Capillary: 91 mg/dL (ref 70–99)

## 2011-01-16 LAB — APTT: aPTT: 23 seconds — ABNORMAL LOW (ref 24–37)

## 2011-01-16 LAB — PROTIME-INR
INR: 0.97 (ref 0.00–1.49)
Prothrombin Time: 13.1 seconds (ref 11.6–15.2)

## 2011-01-21 LAB — GLUCOSE, CAPILLARY: Glucose-Capillary: 104 mg/dL — ABNORMAL HIGH (ref 70–99)

## 2011-01-22 ENCOUNTER — Other Ambulatory Visit: Payer: Self-pay | Admitting: Family Medicine

## 2011-01-22 DIAGNOSIS — R4182 Altered mental status, unspecified: Secondary | ICD-10-CM

## 2011-01-27 ENCOUNTER — Other Ambulatory Visit (HOSPITAL_COMMUNITY): Payer: Medicare Other

## 2011-02-02 LAB — CBC
HCT: 41.8 % (ref 36.0–46.0)
Hemoglobin: 13.8 g/dL (ref 12.0–15.0)
MCHC: 33.1 g/dL (ref 30.0–36.0)
MCV: 83.9 fL (ref 78.0–100.0)
Platelets: 298 10*3/uL (ref 150–400)
RBC: 4.98 MIL/uL (ref 3.87–5.11)
RDW: 13.8 % (ref 11.5–15.5)
WBC: 11.5 10*3/uL — ABNORMAL HIGH (ref 4.0–10.5)

## 2011-02-02 LAB — DIFFERENTIAL
Basophils Absolute: 0.1 10*3/uL (ref 0.0–0.1)
Basophils Relative: 1 % (ref 0–1)
Eosinophils Absolute: 0.3 10*3/uL (ref 0.0–0.7)
Eosinophils Relative: 3 % (ref 0–5)
Lymphocytes Relative: 18 % (ref 12–46)
Lymphs Abs: 2.1 10*3/uL (ref 0.7–4.0)
Monocytes Absolute: 0.8 10*3/uL (ref 0.1–1.0)
Monocytes Relative: 7 % (ref 3–12)
Neutro Abs: 8.3 10*3/uL — ABNORMAL HIGH (ref 1.7–7.7)
Neutrophils Relative %: 72 % (ref 43–77)

## 2011-02-02 LAB — URINALYSIS, ROUTINE W REFLEX MICROSCOPIC
Glucose, UA: NEGATIVE mg/dL
Hgb urine dipstick: NEGATIVE
Ketones, ur: NEGATIVE mg/dL
Nitrite: NEGATIVE
Protein, ur: 30 mg/dL — AB
Specific Gravity, Urine: 1.019 (ref 1.005–1.030)
Urobilinogen, UA: 1 mg/dL (ref 0.0–1.0)
pH: 5.5 (ref 5.0–8.0)

## 2011-02-02 LAB — POCT I-STAT, CHEM 8
BUN: 15 mg/dL (ref 6–23)
Calcium, Ion: 1.17 mmol/L (ref 1.12–1.32)
Chloride: 109 mEq/L (ref 96–112)
Creatinine, Ser: 1.4 mg/dL — ABNORMAL HIGH (ref 0.4–1.2)
Glucose, Bld: 122 mg/dL — ABNORMAL HIGH (ref 70–99)
HCT: 43 % (ref 36.0–46.0)
Hemoglobin: 14.6 g/dL (ref 12.0–15.0)
Potassium: 3.6 mEq/L (ref 3.5–5.1)
Sodium: 140 mEq/L (ref 135–145)
TCO2: 26 mmol/L (ref 0–100)

## 2011-02-02 LAB — URINE MICROSCOPIC-ADD ON

## 2011-02-02 LAB — GLUCOSE, CAPILLARY: Glucose-Capillary: 149 mg/dL — ABNORMAL HIGH (ref 70–99)

## 2011-02-02 LAB — LITHIUM LEVEL: Lithium Lvl: 0.27 mEq/L — ABNORMAL LOW (ref 0.80–1.40)

## 2011-02-03 ENCOUNTER — Inpatient Hospital Stay (HOSPITAL_COMMUNITY): Admission: RE | Admit: 2011-02-03 | Payer: Medicare Other | Source: Ambulatory Visit

## 2011-02-04 ENCOUNTER — Other Ambulatory Visit: Payer: Self-pay | Admitting: Family Medicine

## 2011-02-04 DIAGNOSIS — N6321 Unspecified lump in the left breast, upper outer quadrant: Secondary | ICD-10-CM

## 2011-02-06 ENCOUNTER — Inpatient Hospital Stay (HOSPITAL_COMMUNITY): Admission: RE | Admit: 2011-02-06 | Payer: Medicare Other | Source: Ambulatory Visit

## 2011-02-19 ENCOUNTER — Inpatient Hospital Stay: Admission: RE | Admit: 2011-02-19 | Payer: Medicare Other | Source: Ambulatory Visit

## 2011-02-24 LAB — CHOLESTEROL, TOTAL: Cholesterol: 148 mg/dL (ref 0–200)

## 2011-02-24 LAB — URINALYSIS, ROUTINE W REFLEX MICROSCOPIC
Bilirubin Urine: NEGATIVE
Glucose, UA: NEGATIVE mg/dL
Hgb urine dipstick: NEGATIVE
Ketones, ur: NEGATIVE mg/dL
Nitrite: NEGATIVE
Protein, ur: NEGATIVE mg/dL
Specific Gravity, Urine: 1.011 (ref 1.005–1.030)
Urobilinogen, UA: 0.2 mg/dL (ref 0.0–1.0)
pH: 7.5 (ref 5.0–8.0)

## 2011-02-24 LAB — POCT I-STAT, CHEM 8
BUN: 6 mg/dL (ref 6–23)
Calcium, Ion: 1.14 mmol/L (ref 1.12–1.32)
Chloride: 109 mEq/L (ref 96–112)
Creatinine, Ser: 1.4 mg/dL — ABNORMAL HIGH (ref 0.4–1.2)
Glucose, Bld: 133 mg/dL — ABNORMAL HIGH (ref 70–99)
HCT: 43 % (ref 36.0–46.0)
Hemoglobin: 14.6 g/dL (ref 12.0–15.0)
Potassium: 3.2 mEq/L — ABNORMAL LOW (ref 3.5–5.1)
Sodium: 140 mEq/L (ref 135–145)
TCO2: 21 mmol/L (ref 0–100)

## 2011-02-24 LAB — DIFFERENTIAL
Basophils Absolute: 0 10*3/uL (ref 0.0–0.1)
Basophils Relative: 0 % (ref 0–1)
Eosinophils Absolute: 0.3 10*3/uL (ref 0.0–0.7)
Eosinophils Relative: 2 % (ref 0–5)
Lymphocytes Relative: 18 % (ref 12–46)
Lymphs Abs: 2.1 10*3/uL (ref 0.7–4.0)
Monocytes Absolute: 0.7 10*3/uL (ref 0.1–1.0)
Monocytes Relative: 5 % (ref 3–12)
Neutro Abs: 8.9 10*3/uL — ABNORMAL HIGH (ref 1.7–7.7)
Neutrophils Relative %: 74 % (ref 43–77)

## 2011-02-24 LAB — CBC
HCT: 41.5 % (ref 36.0–46.0)
Hemoglobin: 13.8 g/dL (ref 12.0–15.0)
MCHC: 33.4 g/dL (ref 30.0–36.0)
MCV: 88.4 fL (ref 78.0–100.0)
Platelets: 299 10*3/uL (ref 150–400)
RBC: 4.69 MIL/uL (ref 3.87–5.11)
RDW: 14.3 % (ref 11.5–15.5)
WBC: 12 10*3/uL — ABNORMAL HIGH (ref 4.0–10.5)

## 2011-02-24 LAB — HEPATIC FUNCTION PANEL
ALT: 16 U/L (ref 0–35)
AST: 20 U/L (ref 0–37)
Albumin: 4 g/dL (ref 3.5–5.2)
Alkaline Phosphatase: 143 U/L — ABNORMAL HIGH (ref 39–117)
Bilirubin, Direct: <0.1 mg/dL (ref 0.0–0.3)
Total Bilirubin: 0.4 mg/dL (ref 0.3–1.2)
Total Protein: 6.7 g/dL (ref 6.0–8.3)

## 2011-02-24 LAB — RAPID URINE DRUG SCREEN, HOSP PERFORMED
Amphetamines: NOT DETECTED
Barbiturates: NOT DETECTED
Benzodiazepines: POSITIVE — AB
Cocaine: NOT DETECTED
Opiates: POSITIVE — AB
Tetrahydrocannabinol: NOT DETECTED

## 2011-02-24 LAB — T3: T3, Total: 61.1 ng/dl — ABNORMAL LOW (ref 80.0–204.0)

## 2011-02-24 LAB — TSH: TSH: 0.313 u[IU]/mL — ABNORMAL LOW (ref 0.350–4.500)

## 2011-02-24 LAB — LITHIUM LEVEL: Lithium Lvl: 1.04 mEq/L (ref 0.80–1.40)

## 2011-02-24 LAB — BRAIN NATRIURETIC PEPTIDE: Pro B Natriuretic peptide (BNP): <30 pg/mL (ref 0.0–100.0)

## 2011-02-24 LAB — T4: T4, Total: 4.2 ug/dL — ABNORMAL LOW (ref 5.0–12.5)

## 2011-03-02 ENCOUNTER — Ambulatory Visit
Admission: RE | Admit: 2011-03-02 | Discharge: 2011-03-02 | Disposition: A | Discharge status: Home / Self Care | Payer: Medicare Other | Source: Ambulatory Visit | Attending: Family Medicine | Admitting: Family Medicine

## 2011-03-02 ENCOUNTER — Other Ambulatory Visit: Payer: Self-pay | Admitting: Family Medicine

## 2011-03-02 DIAGNOSIS — N6321 Unspecified lump in the left breast, upper outer quadrant: Secondary | ICD-10-CM

## 2011-03-30 ENCOUNTER — Ambulatory Visit: Payer: Medicare Other | Attending: Physical Therapy | Admitting: Physical Therapy

## 2011-03-31 NOTE — Assessment & Plan Note (Signed)
[facility]                            CARDIOLOGY OFFICE NOTE   NAME:Erin Donaldson, [name]                      MRN:          [id number]  DATE:09/26/2008                            DOB:          05-20-1951    Erin Donaldson comes in today because of syncope.  Her husband is with  her.  He provides a lot of history.   I saw her several years ago for preoperative clearance for gastric  bypass.  She initially lost 136 pounds.  She was able to get off of all  her lipid-lowering drugs as well as her diabetic drugs.   The last time I saw her was on November 27, 2005.   She has had a previous cardiac catheterization, which was normal in  2003.  Her last 2-D echocardiogram was in 2003 was essentially normal.   She has been diagnosed with Alzheimer.  She is followed by Dr. Jeanie Sewer  in [facility].   She has been having basically what sounds like drop attacks.  There is  no premonition, no warning symptoms whatsoever.  She specifically denies  any palpitations, shortness of breath, chest pain, diaphoresis, nausea,  abdominal pain, etc.  These have been witnessed by her husband on  several occasions.   She usually comes to within a couple of minutes.  She is usually clear,  when she comes to.  One time EMS picked her up and she was not awake or  alert, until she got to [facility].  Apparently, at that time,  her blood sugar was checked and was normal.  Her blood pressure is  always running 120-130 systolic, even when she is still out.  Her heart  rate has not been measured by her husband.   CURRENT MEDICATIONS:  1. Multivitamin daily.  2. Citracal with mag daily.  3. Prilosec 20 mg p.o. b.i.d.  4. Gabapentin 900 mg q.i.d.  5. Benztropine 1 mg b.i.d.  6. Topamax 100 mg b.i.d.  7. Vitamin B12.  8. Emasam patch.  9. Lamictal 200 mg a day.  10.Seroquel 200 mg nightly.  11.Osteo Bi-Flex daily.  12.Galantamine 8 mg a day.   PHYSICAL EXAMINATION:   VITAL SIGNS:  Her blood pressure is 122/85, her  pulse is 100 and regular.  EKG shows a heart rate of 94, it was  rightward axis, low voltage which is really shows no major changes than  before.  The low-voltage may be new.  Her weight is up to 203 from 164.  HEENT:  Essentially normal.  Her speech is understandable, but she is a  little bit dysarthric.  NECK:  Carotid upstrokes were equal bilateral without bruits.  No JVD.  Thyroid is not enlarged.  Trachea is midline.  LUNGS:  Clear to auscultation and percussion.  HEART:  Regular rate and rhythm.  No murmur, rub, or gallop.  PMI is  nondisplaced.  ABDOMEN:  Soft.  Good bowel sounds.  No midline bruits.  EXTREMITIES:  No cyanosis, clubbing, or edema.  SKIN:  Multiple areas of ecchymoses and scratches of her forearms.  This  is being followed by Dr. Myna Hidalgo and actually got better with some  doxycycline per her husband.   I had a long talk with Erin Donaldson and her husband.  It does not sound  like these are cardiac, though Dr. Jeanie Sewer would like [place] to check a  cardiac monitor to rule out any arrhythmia.  I will also check a 2-D  echocardiogram at this low voltage.  I suspect a cardiac evaluation and  will be negative.  I suspect most of this is neurological.     Maisie Fus C. Daleen Squibb, MD, [facility]  Electronically Signed    TCW/MedQ  DD: 09/26/2008  DT: 09/27/2008  Job #: [id number]   cc:   [place] Dr. Jeanie Sewer, [facility]

## 2011-04-03 NOTE — Cardiovascular Report (Signed)
[place]  Patient:    Erin Donaldson, Erin Donaldson Visit Number: [id number] MRN: [id number]          Service Type: CAT Location: [facility] 2860 01 Attending Physician:  Daisey Must Dictated by:   Daisey Must, M.D. [facility] Proc. Date: 03/14/02 Admit Date:  03/14/2002 Discharge Date: 03/14/2002   CC:         Ruben Gottron, M.D.  Thomas C. Wall, M.D. [facility]  Cardiac Catheterization Lab   Cardiac Catheterization  PROCEDURES PERFORMED: 1. Left heart catheterization. 2. Coronary angiography. 3. Left ventriculography.  INDICATIONS:  Ms. Carreras is a 60 year old woman with multiple cardiac risk factors, including diabetes mellitus and metabolic syndrome.  She has been experiencing progressive exertional dyspnea and chest tightness.  She was referred for cardiac catheterization by Dr. Daleen Squibb to rule out significant coronary artery disease.  CATHETERIZATION PROCEDURAL NOTE:  A 6-French sheath was placed in the right femoral artery.  Standard Judkins 6-French catheters were utilized.  Contrast was Omnipaque.  There were no complications.  RESULTS:  HEMODYNAMICS: 1. Left ventricular pressure:  128/38. 2. Aortic pressure:  116/76. There is no aortic valve gradient.  LEFT VENTRICULOGRAM:  Wall motion is normal.  Ejection fraction estimated at greater than or equal to 60%.  There is no mitral regurgitation.  CORONARY ANGIOGRAPHY: (Right dominant) 1. LEFT MAIN CORONARY:  Has a lesion in the ostium, which appears to    be approximately 30% in severity.  In the cranial views the disease    appears to be more significant; however, in the caudal views it does not    appear to be significant.  This may represent an eccentric plaque versus    angulation of the left main arising from the aorta. 2. LEFT ANTERIOR DESCENDING ARTERY:  Gives rise to a small first diagonal    branch, an enormous second diagonal branch.  The LAD is free of significant    disease. 3. LEFT  CIRCUMFLEX ARTERY:  Gives rise to a small OM1, normal sized OM2 and    normal sized OM3.  There is a 20% stenosis in the mid circumflex after    OM2. 4. RIGHT CORONARY ARTERY:  A dominant vessel.  It gives rise to a large    posterior descending artery and a small posterolateral branch.  There is    a 30% stenosis in the mid right coronary artery.  IMPRESSION: 1. Normal left ventricular systolic function, with significantly elevated    left ventricular end-diastolic pressure. 2. No clearly significant coronary artery disease was identified.  There    is a lesion in the ostium of the left main, of questionable significance.    Of note, there was some mild damping of pressure wave form and    ________ with catheter engagement.  PLAN: 1. An adenosine Cardiolite will be obtained to rule out ischemia in the    distribution of the left main coronary. 2. A 2-D echocardiogram will be obtained to further assess the left    ventricular systolic and diastolic function.  In particular, we need to    rule out diastolic dysfunction based on the significantly elevated left    ventricular end-diastolic pressure. Dictated by:   Daisey Must, M.D. [facility] Attending Physician:  Daisey Must DD:  03/14/02 TD:  03/14/02 Job: [id number]

## 2011-04-03 NOTE — H&P (Signed)
NAME:  Erin Donaldson, Erin Donaldson                         ACCOUNT NO.:  [id number]   MEDICAL RECORD NO.:  [id number]                   PATIENT TYPE:  INP   LOCATION:  0155                                 FACILITY:  [facility]   PHYSICIAN:  Sherin Quarry, MD                   DATE OF BIRTH:  02/23/1951   DATE OF ADMISSION:  08/27/2003  DATE OF DISCHARGE:                                HISTORY & PHYSICAL   PRIMARY CARE PHYSICIAN:  Ernestina Penna, M.D.   REASON FOR ADMISSION:  Erin Donaldson is a 60 year old lady who presents  to [facility] Emergency Room with a three day histor of cough associated  with intermittent chills and fever. The cough was initially productive of  greenish phlegm but has been nonproductive in the last 24 hours. Tonight,  she has been unable to sleep secondary to coughing and progressive dyspnea.  She also noted associated headache, anorexia and nausea. There has been mild  diarrhea.   On presentation to the emergency room, the patient was noted to be in  definite respiratory distress with her respiratory rate initially being  about 40. She was quite hypoxic with an initial blood gas with a PO2 of  41.0, pCO2 of 48.0, pH of 7.45. The chest x-ray revealed a dense right lower  lobe lobar pneumonia. She is admitted for treatment of this problem. She  indicates that she does not smoke.   ALLERGIES:  No known drug allergies.   CURRENT MEDICATIONS:  1. Lipitor 10 mg daily.  2. Lexapro 10 mg daily.  3. Avandia 4 mg b.i.d.  4. Glucophage 1 g b.i.d.  5. Lasix - uncertain dose.  6. Ambien p.r.n.  7. Trazodone 100 mg daily.   ILLNESSES:  1. The patient has a five year history of diabetes. She does not check her     blood sugars at home and does not really know too much about how well her     diabetes is doing. She has no history of cataracts, retinopathy or     nephropathy as far as she knows.  2. Hyperlipidemia. The patient is treated with Lipitor. She does not recall    how recently her lipid profile has been checked.  3. Depression. The patient is currently receiving treatment for chronic     depression.   OPERATIONS:  1. The patient had a cholecystectomy in January of 2003.  2. She indicates that she was having intermittent chest pains and therefore,     last year Dr. Daleen Squibb did a cardiac catheterization which showed only a 30%     lesion of a single vessel and a normal ejection fraction, thus, the     catheterization was essentially normal.  3. She has also had a colonoscopy which was normal.   FAMILY HISTORY:  Her mother is status post CABG. Her mother had multiple  stent procedures. She  has also had breast cancer and bladder cancer. Her  father is status post CABG x5. He has also had diabetes and renal failure.  She has two brothers who have had cardiac catheterizations and have severe  heart problems.   SOCIAL HISTORY:  The patient lives with her husband. She has never smoked.  She denies use of alcohol or drug abuse. She is very anxious about being in  the hospital.   REVIEW OF SYMPTOMS:  HEENT:  The head - See above. Ear, nose and throat -  She denies earaches, sinus pain or sore throat. CHEST:  See above.  CARDIOVASCULAR:  She denies orthopnea, PND or ankle edema. There has been no  exertional chest pain. GI:  She has been anorectic. There has been no  vomiting and no abdominal pain, no diarrhea. GU:  She denies dysuria,  urinary frequency, hesitancy or nocturia. RHEUMATOLOGIC:  Denies back pain  or joint pain. HEMATOLOGIC:  Denies easy bleeding or bruising. NEUROLOGIC:  Denies history of seizure or stroke.   PHYSICAL EXAMINATION:  VITAL SIGNS:  On physical exam her blood pressure is  162/80, pulse is 91, respirations are initially 40, now with oxygen her  respirations are probably about 24.  GENERAL APPEARANCE:  She is an obese lady who was obviously very anxious and  is in mild distress.  HEENT:  Within normal limits.  LUNGS:   Examination of the chest reveals diminished breath sounds at the  bases; a few course rhonchi are audible on the right. There is wheezing  audible with coughing.  CARDIOVASCULAR:  Exam reveals normal S1and S2; there are no rubs, murmurs or  gallops.  ABDOMEN:  The abdomen is benign, interval bowel sounds without masses,  tenderness or organomegaly.  NEUROLOGIC:  Testing and examination of the extremities are normal.   LABORATORY AND ACCESSORY DATA:  Chest x-ray as previously noted reveals a  lobar pneumonia. Her electrolytes are notable for a potassium of 2.5.   IMPRESSION:  1. Lobar pneumonia.  2. Marked hypoxia secondary to #1.  3. Hypokalemia.  4. Diabetes.  5. Hyperlipidemia.  6. Status post cholecystectomy.  7. History of normal cardiac catheterization.  8. Strong family history of heart disease.  9. Depression.   PLAN:  The patient will be admitted to the hospital for treatment. I  emphasized to her that this was extremely important. We will administer  oxygen to correct her hypoxia. She will be given nebulizer treatments and  will be begun on Zithromax 500 mg daily and Rocephin 2 g IV daily. Her blood  sugars will be carefully monitored. We will check her renal function, liver  profile and lipid profile closely. Ultimately, she will probably need ten to  fourteen days of antibiotic therapy, but, we will switch to an oral regimen  as soon as there is definite improvement in overall status and in her  hypoxia.                                              Sherin Quarry, MD   SY/MEDQ  D:  08/27/2003  T:  08/27/2003  Job:  [id number]

## 2011-04-03 NOTE — Procedures (Signed)
[facility]  Patient:    Erin Donaldson, Erin Donaldson Visit Number: [id number] MRN: [id number]          Service Type: END Location: ENDO Attending Physician:  Mervin Hack Proc. Date: 09/13/01 Admit Date:  09/13/2001   CC:         Monica Becton, M.D.   Procedure Report  PROCEDURE PERFORMED:  Upper endoscopy.  ENDOSCOPIST:  Hedwig Morton. Juanda Chance, M.D. [facility]  INDICATIONS:  This 60 year old female has a history of iron deficiency anemia mostly attributed to menorrhagia.  She has been on ibuprofen and BCs for headaches and has complained of epigastric pain.  Her ultrasound was positive for a solitary stone.  Her epigastric tenderness has persisted on physical exam on 08/30/01.  She is scheduled for upper endoscopy for evaluation of epigastric pain to rule out end-stage related gastropathy.  ENDOSCOPE:  Olympus video endoscope.  SEDATION:  Versed 10 mg IV and IV Demerol 75 mg.  FINDINGS:  The Olympus video endoscope was passed into the esophagus.  The patient was monitored and oxygen saturation were normal.  The proximal and distal esophageal mucosa was unremarkable.  There was normal columnar junction, no evidence of esophagitis, and no hiatal hernia.  The stomach was insufflated with air and showed normal rugal folds.  There was a small amount of bile along the greater curvature of the stomach.  There was minimal erythema in the prepyloric antrum.  Biopsies were taken for CLOtest. The pyloric outlet was normal.  Retroflexion of the endoscope revealed normal sinus and cardia.  The descending duodenum is normal.  IMPRESSION: 1. Bile reflux. 2. Status post CLOtest.  PLAN:  The upper endoscopy findings do not account for patients abdominal pain and dyspepsia, which may be related to poorly functioning gallbladder. We will discuss with patient the possibility of doing a cholecystectomy.  We may need to do a hepatobiliary scan prior to cholecystectomy to  determine whether her gallbladder is functioning. Attending Physician:  Mervin Hack DD:  09/13/01 TD:  09/14/01 Job: [id number]

## 2011-04-03 NOTE — Op Note (Signed)
   NAME:  Erin Donaldson, Erin Donaldson                         ACCOUNT NO.:  [id number]   MEDICAL RECORD NO.:  [id number]                   PATIENT TYPE:  AMB   LOCATION:  ENDO                                 FACILITY:  [facility]   PHYSICIAN:  Lina Sar, M.D. [facility]               DATE OF BIRTH:  February 11, 1951   DATE OF PROCEDURE:  DATE OF DISCHARGE:                                 OPERATIVE REPORT   NAME OF PROCEDURE:  Colonoscopy.   INDICATIONS:  This 60 year old white female has a history of post  cholecystectomy diarrhea.  She has been obese and has not lost any weight.  Her stool was hemoccult-negative on physical examination.  She has been also  complaining of diffuse abdominal pain.  The patient has been on Questran 4  mg daily with some improvement of her symptoms.  The diarrhea is persistent  intermittently.  She is undergoing colonoscopy to rule out microscopic  colitis or inflammatory bowel disease.   ENDOSCOPE:  Olympus single channel video endoscope.   SEDATION:  Versed 12.5 mg IV, Demerol 100 mg IV.   FINDINGS:  Olympus single channel video endoscope passed under direct vision  into rectum to sigmoid colon.  The patient was monitored by pulse oximeter.  Her oxygen saturations were normal.  Rectal tone and rectal ampullae were  normal.  There was no perianal disease.  Sigmoid colon mucosa showed normal  submucosal blood vessels.  There were no erosions and nothing to suggest  colitis.  Colonoscope passed easily through the descending colon, splenic  flexure, transverse colon, hepatic flexure to ascending colon to the cecum.  Cecal pouch, ileocecal valve were viewed thoroughly and showed normal valve  and appendiceal opening.  Multiple biopsies were taken from the right colon,  transverse, and left colon as the endoscope was retracted.  Colon was  decompressed.  There were no diverticula or polyps.   IMPRESSION:  Normal colonoscopy to the cecum status post random biopsies to  rule out  microscopic colitis.   PLAN:  Await results of the biopsies.  If they are normal then we will  continue to treat for irritable bowel syndrome and post cholecystectomy  diarrhea with Questran 4 mg daily and Lomotil one at bedtime.  Lomotil could  be increased to two tablets daily depending on severity of her symptoms.                                               Lina Sar, M.D. [facility]    DB/MEDQ  D:  04/06/2003  T:  04/06/2003  Job:  [id number]   cc:   Ernestina Penna, M.D.  [address] [place]  [place] [address]  Fax: [phone]

## 2011-04-03 NOTE — Discharge Summary (Signed)
[name], Donaldson                         ACCOUNT NO.:  [id number]   MEDICAL RECORD NO.:  [id number]                   PATIENT TYPE:  INP   LOCATION:  0155                                 FACILITY:  [facility]   PHYSICIAN:  Jackie Plum, M.D.             DATE OF BIRTH:  12-05-1950   DATE OF ADMISSION:  08/27/2003  DATE OF DISCHARGE:  08/30/2003                                 DISCHARGE SUMMARY   DISCHARGE DIAGNOSES:  1. Multilobar pneumonia with hypoxia improved.  2. Recurrent hypokalemia.  3. Diabetes mellitus.  4. Dyslipidemia.  5. Depression.   DISCHARGE MEDICATIONS:  The patient is going to resume all her preadmission  medications which include:  1. Lipitor 10 daily.  2. Lexapro 10 daily.  3. Avandia 4 b.i.d.  4. Glucophage b.i.d.  5. Lasix.  6. Ambien.  7. Trazodone.   In addition the following new medications have been prescribed for the  patient:  1. Combivent inhaler, 2 puffs q.i.d.  2. Ceftin 500 mg p.o. t.i.d.  3. Humibid LA 600 mg 2 tabs b.i.d.  4. K-Dur 14 mEq daily.  5. Oxygen 1-2 liters per minute by nasal cannula with activity.   ACTIVITY:  As tolerated.   DIET:  Low fat diet.   SPECIAL INSTRUCTIONS:  The patient had been consulted about the use of  oxygen, that oxygen is inflammable and therefore should not smoke cigarettes  around the oxygen tank and neither should anyone be allowed to smoke  cigarettes around the oxygen tank. She is to report to M.D. if there are any  problems including fever, chills, shortness of breath. Followup will be with  [name] on Tuesday, September 04, 2003, at 2:00 p.m.   DISCHARGE LABORATORY DATA:  WBC count 4.5, hemoglobin 10.8, hematocrit 32.2,  MCV 85.5, platelet count 246. Potassium 3.8, sodium 135, chloride 96, CO2  34, glucose 147. BUN 1, creatinine 0.6, calcium 8.6. Cholesterol 83, HDL 28,  LDL 32.   CONSULTANTS:  Not applicable.   PROCEDURE:  Not applicable.   CONDITION ON DISCHARGE:  Improved and  stable.   REASON FOR HOSPITALIZATION:  MULTILOBAR PNEUMONIA.  The patient presented  with cough productive of greenish sputum, intermittent chills and fever of  three days duration. She had some progressive dyspnea. At the ED, she was  found to be in respiratory distress with initial respiratory rate of about  40 per minute. Her ABG initially was notable for PO2 of 41 with pCO2 of 48,  and pH of 7.5 in the ED. X-ray revealed dense right lower lobe pneumonia and  she was subsequently admitted to the hospital service for further  management. Please see the H&P dictated by Dr. Sherin Quarry dictated  August 27, 2003 for further information regarding the patient's full  presentation including history and physical.   HOSPITAL COURSE:  HYPOXIC RESPIRATORY FAILURE SECONDARY TO LOBAR PNEUMONIA.  The patient was admitted to a  step down bed. She was started on oxygen  therapy with antibiotics i.e. Zithromax and Rocephin.  She received  nebulized treatment with albuterol and Pulmicort. Her medications were  continued except for her antidiabetic medications--these were subsequently  restarted when patient started getting better and had __________ increased  optimized. She had borderline tachycardia; however, when she mentioned that  she was on Lopressor and this medication started the tachycardia resolved  spontaneously. Blood cultures were drawn on admission; however, at the time  of discharge there was no report of any growth and this will need to be  followed up on her visit to her primary care doctor.   The patient had episodes of recurrent hypokalemia and had to receive  supplemental K-Dur in hospital. She was being discharged home on a standing  order of K-Dur and she would need recheck of her potassium once she sees a  doctor as mentioned above.   The patient really wanted to go home. She felt very much __________ in the  hospital and a bit anxious to be in the hospital. After further  discussions  over the last two days, we agree that it is okay for her to go home today  with oxygen 1-2 liters per minute by nasal cannula with activity. In fact  her O2 saturation on 1.5 liters this morning was 98%, on room air 95.4% at  rest and with activity her saturations dropped down to 82% in ICU. The  patient's husband has been very supportive during her hospitalization and  was at bedside every day during my evaluation of this patient. I have spent  time with them talking about the use of oxygen at home and the need to  prevent any cigarette smoking or any excessive heat around the tank, and  then just related to the tank __________ for these activities,  and they  expressed understanding.                                               Jackie Plum, M.D.    GO/MEDQ  D:  08/30/2003  T:  08/30/2003  Job:  [id number]   cc:   Ernestina Penna, M.D.  [address] [place]  [place] [address]  Fax: [phone]

## 2011-04-03 NOTE — Op Note (Signed)
NAMEROXIE, KREEGER                           ACCOUNT NO.:  [id number]   MEDICAL RECORD NO.:  [id number]                   PATIENT TYPE:   LOCATION:                                       FACILITY:   PHYSICIAN:  Sandria Bales. Ezzard Standing, M.D.               DATE OF BIRTH:   DATE OF PROCEDURE:  11/18/2001  DATE OF DISCHARGE:                                 OPERATIVE REPORT   REDICTATION:  Second dictation.   PREOPERATIVE DIAGNOSIS:  Chronic cholecystitis with cholelithiasis.   POSTOPERATIVE DIAGNOSIS:  Chronic cholecystitis with cholelithiasis.   PROCEDURE:  Laparoscopic cholecystectomy with interoperative cholangiogram.   SURGEON:  Sandria Bales. Ezzard Standing, M.D.   FIRST ASSISTANT:  Michael __________   ANESTHESIA:  General endotracheal.   ESTIMATED BLOOD LOSS:  Minimal.   INDICATIONS FOR PROCEDURE:  The patient is a 60 year old white female who  has had at least one attack in the past where she thought she was having a  heart attack. An ultrasound showed a 1.9 cm gallstone and this was in 2000.  Repeat ultrasound, as recently as September 23, 2001, showed a gallstone that  was 2.1 cm in size with no other significant symptoms. Obviously, because  the patient has had some symptoms in the past, that she is diabetic, she has  a gallstone greater than 2 cm, and she is obese she would best served by  having elective cholecystectomy than emergency cholecystectomy. I discussed  both the potential indications and complications with the patient.   DESCRIPTION OF PROCEDURE:  The patient was placed in the supine position  with her arms out to her side. She had orogastric tube and PAF stockings in  place. Her abdomen was prepped with Betadine solution and sterilely draped.   An infraumbilical incision was made with sharp dissection and carried down  to the abdominal cavity. A 0-degree laparoscope was inserted through a 12-mm  Hasson trocar, secured with a 0 Vicryl suture. Abdominal exploration with a  laparoscope was unremarkable. Additional trocars were placed, a 10-mm  Ethicon trocar in the subxiphoid location, a 5-mm right mid subcostal trocar  was placed, and then a 5-mm lateral subcostal trocar was placed. The  gallbladder was grasped, located cephalad and dissected. Dissection was  carried out primarily with blunt dissection with a right angle and hook  Bovie coagulation.   The cystic duct gallbladder junction was identified, quickly placed on the  side of the cystic duct.  A Taut catheter was inserted into the abdominal  cavity through a Jelco and Taut catheter had been cutoff at the end and  inserted into the side of the cut cystic duct. The Taut catheter was secured  with an Endo Clip and cholangiogram obtained under direct fluoroscopy with  about 8 cc. This showed free flow of dye down to the common bile duct into  the duodenum with no obstruction mass or lesion and free reflux  up the  hepatic radical. This was felt to be a normal intraoperative cholangiogram.   The Taut catheter was then removed. The cystic duct triply endoclipped and  divided. The gallbladder was then divided and the cystic artery was  identified, triply endoclipped and divided. The gallbladder was then sharply  and bluntly dissected from the gallbladder bed. __________ mid division from  the gallbladder from the gallbladder bed. The gallbladder bed was  visualized. There was no bile leak, no bleeding. There was no bile leak or  bleeding from the triangle of Calot. The gallbladder was then delivered  through the umbilicus and sent to pathology. The wounds were each irrigated,  the umbilical trocar closed with a 0 Vicryl suture. Each trocar was removed.  There was no bleeding at trocar site and the skin at each site was closed  with a 5-0 Vicryl suture.   The wounds were then painted with tincture of Benzoin and Steri-Strip and  sterilely dressed. The patient tolerated the procedure well, was transported   to the recovery room with sponge and needle counts correct at the end of the  case.                                               Sandria Bales. Ezzard Standing, M.D.    DHN/MEDQ  D:  06/15/2002  T:  06/21/2002  Job:  [id number]

## 2011-04-15 ENCOUNTER — Ambulatory Visit: Payer: Medicare Other | Admitting: Physical Therapy

## 2011-04-16 ENCOUNTER — Other Ambulatory Visit: Payer: Self-pay | Admitting: Neurology

## 2011-04-16 DIAGNOSIS — R1314 Dysphagia, pharyngoesophageal phase: Secondary | ICD-10-CM

## 2011-04-16 DIAGNOSIS — R269 Unspecified abnormalities of gait and mobility: Secondary | ICD-10-CM

## 2011-04-16 DIAGNOSIS — F068 Other specified mental disorders due to known physiological condition: Secondary | ICD-10-CM

## 2011-04-20 ENCOUNTER — Other Ambulatory Visit: Payer: Medicare Other

## 2011-04-30 ENCOUNTER — Ambulatory Visit
Admission: RE | Admit: 2011-04-30 | Discharge: 2011-04-30 | Disposition: A | Discharge status: Home / Self Care | Payer: Medicare Other | Source: Ambulatory Visit | Attending: Neurology | Admitting: Neurology

## 2011-04-30 DIAGNOSIS — R269 Unspecified abnormalities of gait and mobility: Secondary | ICD-10-CM

## 2011-04-30 DIAGNOSIS — R1314 Dysphagia, pharyngoesophageal phase: Secondary | ICD-10-CM

## 2011-04-30 DIAGNOSIS — F068 Other specified mental disorders due to known physiological condition: Secondary | ICD-10-CM

## 2011-05-27 ENCOUNTER — Other Ambulatory Visit: Payer: Self-pay | Admitting: Internal Medicine

## 2011-05-28 ENCOUNTER — Other Ambulatory Visit: Payer: Self-pay | Admitting: Hematology & Oncology

## 2011-05-28 ENCOUNTER — Encounter (HOSPITAL_BASED_OUTPATIENT_CLINIC_OR_DEPARTMENT_OTHER): Payer: Medicare Other | Admitting: Hematology & Oncology

## 2011-05-28 DIAGNOSIS — D649 Anemia, unspecified: Secondary | ICD-10-CM

## 2011-05-28 DIAGNOSIS — R29818 Other symptoms and signs involving the nervous system: Secondary | ICD-10-CM

## 2011-05-28 LAB — CBC WITH DIFFERENTIAL (CANCER CENTER ONLY)
BASO#: 0 10*3/uL (ref 0.0–0.2)
BASO%: 0.6 % (ref 0.0–2.0)
EOS%: 5.6 % (ref 0.0–7.0)
Eosinophils Absolute: 0.3 10*3/uL (ref 0.0–0.5)
HCT: 31.4 % — ABNORMAL LOW (ref 34.8–46.6)
HGB: 10.2 g/dL — ABNORMAL LOW (ref 11.6–15.9)
LYMPH#: 1.1 10*3/uL (ref 0.9–3.3)
LYMPH%: 22.5 % (ref 14.0–48.0)
MCH: 28.3 pg (ref 26.0–34.0)
MCHC: 32.5 g/dL (ref 32.0–36.0)
MCV: 87 fL (ref 81–101)
MONO#: 0.5 10*3/uL (ref 0.1–0.9)
MONO%: 9.9 % (ref 0.0–13.0)
NEUT#: 2.9 10*3/uL (ref 1.5–6.5)
NEUT%: 61.4 % (ref 39.6–80.0)
Platelets: 253 10*3/uL (ref 145–400)
RBC: 3.6 10*6/uL — ABNORMAL LOW (ref 3.70–5.32)
RDW: 17.2 % — ABNORMAL HIGH (ref 11.1–15.7)
WBC: 4.7 10*3/uL (ref 3.9–10.0)

## 2011-05-28 LAB — CHCC SATELLITE - SMEAR

## 2011-06-01 LAB — PROTEIN ELECTROPHORESIS, SERUM
Albumin ELP: 53.7 % — ABNORMAL LOW (ref 55.8–66.1)
Alpha-1-Globulin: 7.3 % — ABNORMAL HIGH (ref 2.9–4.9)
Alpha-2-Globulin: 12.7 % — ABNORMAL HIGH (ref 7.1–11.8)
Beta 2: 5.1 % (ref 3.2–6.5)
Beta Globulin: 5.4 % (ref 4.7–7.2)
Gamma Globulin: 15.8 % (ref 11.1–18.8)
Total Protein, Serum Electrophoresis: 5.7 g/dL — ABNORMAL LOW (ref 6.0–8.3)

## 2011-06-01 LAB — IRON AND TIBC
%SAT: 24 % (ref 20–55)
Iron: 52 ug/dL (ref 42–145)
TIBC: 216 ug/dL — ABNORMAL LOW (ref 250–470)
UIBC: 164 ug/dL

## 2011-06-01 LAB — FERRITIN: Ferritin: 70 ng/mL (ref 10–291)

## 2011-06-01 LAB — COMPREHENSIVE METABOLIC PANEL
ALT: 114 U/L — ABNORMAL HIGH (ref 0–35)
AST: 332 U/L — ABNORMAL HIGH (ref 0–37)
Albumin: 3.2 g/dL — ABNORMAL LOW (ref 3.5–5.2)
Alkaline Phosphatase: 338 U/L — ABNORMAL HIGH (ref 39–117)
BUN: 15 mg/dL (ref 6–23)
CO2: 27 mEq/L (ref 19–32)
Calcium: 8.8 mg/dL (ref 8.4–10.5)
Chloride: 103 mEq/L (ref 96–112)
Creatinine, Ser: 1.12 mg/dL — ABNORMAL HIGH (ref 0.50–1.10)
Glucose, Bld: 61 mg/dL — ABNORMAL LOW (ref 70–99)
Potassium: 4.4 mEq/L (ref 3.5–5.3)
Sodium: 140 mEq/L (ref 135–145)
Total Bilirubin: 0.4 mg/dL (ref 0.3–1.2)
Total Protein: 5.7 g/dL — ABNORMAL LOW (ref 6.0–8.3)

## 2011-06-01 LAB — ERYTHROPOIETIN: Erythropoietin: 18.8 m[IU]/mL (ref 2.6–34.0)

## 2011-06-01 LAB — RETICULOCYTES (CHCC)
ABS Retic: 40.4 10*3/uL (ref 19.0–186.0)
RBC.: 3.67 MIL/uL — ABNORMAL LOW (ref 3.87–5.11)
Retic Ct Pct: 1.1 % (ref 0.4–2.3)

## 2011-06-01 LAB — VITAMIN B12: Vitamin B-12: 825 pg/mL (ref 211–911)

## 2011-06-01 LAB — LACTATE DEHYDROGENASE: LDH: 357 U/L — ABNORMAL HIGH (ref 94–250)

## 2011-06-03 ENCOUNTER — Other Ambulatory Visit: Payer: Self-pay | Admitting: Hematology & Oncology

## 2011-06-03 DIAGNOSIS — R7989 Other specified abnormal findings of blood chemistry: Secondary | ICD-10-CM

## 2011-06-04 ENCOUNTER — Encounter: Payer: Medicare Other | Admitting: Hematology & Oncology

## 2011-06-04 ENCOUNTER — Ambulatory Visit (HOSPITAL_BASED_OUTPATIENT_CLINIC_OR_DEPARTMENT_OTHER)
Admission: RE | Admit: 2011-06-04 | Discharge: 2011-06-04 | Disposition: A | Discharge status: Home / Self Care | Payer: Medicare Other | Source: Ambulatory Visit | Attending: Hematology & Oncology | Admitting: Hematology & Oncology

## 2011-06-04 DIAGNOSIS — R945 Abnormal results of liver function studies: Secondary | ICD-10-CM | POA: Insufficient documentation

## 2011-06-04 DIAGNOSIS — Z9089 Acquired absence of other organs: Secondary | ICD-10-CM

## 2011-06-04 DIAGNOSIS — Z9884 Bariatric surgery status: Secondary | ICD-10-CM

## 2011-06-04 DIAGNOSIS — D509 Iron deficiency anemia, unspecified: Secondary | ICD-10-CM

## 2011-06-04 DIAGNOSIS — R748 Abnormal levels of other serum enzymes: Secondary | ICD-10-CM

## 2011-06-04 DIAGNOSIS — R7989 Other specified abnormal findings of blood chemistry: Secondary | ICD-10-CM

## 2011-06-24 ENCOUNTER — Emergency Department (HOSPITAL_COMMUNITY): Payer: Medicare Other

## 2011-06-24 ENCOUNTER — Emergency Department (HOSPITAL_COMMUNITY)
Admission: EM | Admit: 2011-06-24 | Discharge: 2011-06-24 | Disposition: A | Discharge status: Home / Self Care | Payer: Medicare Other | Attending: Emergency Medicine | Admitting: Emergency Medicine

## 2011-06-24 DIAGNOSIS — R6884 Jaw pain: Secondary | ICD-10-CM | POA: Insufficient documentation

## 2011-06-24 DIAGNOSIS — S1093XA Contusion of unspecified part of neck, initial encounter: Secondary | ICD-10-CM | POA: Insufficient documentation

## 2011-06-24 DIAGNOSIS — F341 Dysthymic disorder: Secondary | ICD-10-CM | POA: Insufficient documentation

## 2011-06-24 DIAGNOSIS — Y9229 Other specified public building as the place of occurrence of the external cause: Secondary | ICD-10-CM | POA: Insufficient documentation

## 2011-06-24 DIAGNOSIS — Z8673 Personal history of transient ischemic attack (TIA), and cerebral infarction without residual deficits: Secondary | ICD-10-CM | POA: Insufficient documentation

## 2011-06-24 DIAGNOSIS — F0280 Dementia in other diseases classified elsewhere without behavioral disturbance: Secondary | ICD-10-CM | POA: Insufficient documentation

## 2011-06-24 DIAGNOSIS — W1809XA Striking against other object with subsequent fall, initial encounter: Secondary | ICD-10-CM | POA: Insufficient documentation

## 2011-06-24 DIAGNOSIS — S0083XA Contusion of other part of head, initial encounter: Secondary | ICD-10-CM | POA: Insufficient documentation

## 2011-06-24 DIAGNOSIS — M79609 Pain in unspecified limb: Secondary | ICD-10-CM | POA: Insufficient documentation

## 2011-06-24 DIAGNOSIS — G309 Alzheimer's disease, unspecified: Secondary | ICD-10-CM | POA: Insufficient documentation

## 2011-06-24 DIAGNOSIS — M25539 Pain in unspecified wrist: Secondary | ICD-10-CM | POA: Insufficient documentation

## 2011-06-24 DIAGNOSIS — E119 Type 2 diabetes mellitus without complications: Secondary | ICD-10-CM | POA: Insufficient documentation

## 2011-06-24 DIAGNOSIS — S02609A Fracture of mandible, unspecified, initial encounter for closed fracture: Secondary | ICD-10-CM | POA: Insufficient documentation

## 2011-06-24 DIAGNOSIS — S0180XA Unspecified open wound of other part of head, initial encounter: Secondary | ICD-10-CM | POA: Insufficient documentation

## 2011-06-24 DIAGNOSIS — S0003XA Contusion of scalp, initial encounter: Secondary | ICD-10-CM | POA: Insufficient documentation

## 2011-06-24 DIAGNOSIS — S52599A Other fractures of lower end of unspecified radius, initial encounter for closed fracture: Secondary | ICD-10-CM | POA: Insufficient documentation

## 2011-06-24 LAB — GLUCOSE, CAPILLARY: Glucose-Capillary: 79 mg/dL (ref 70–99)

## 2011-07-15 ENCOUNTER — Other Ambulatory Visit: Payer: Self-pay | Admitting: Hematology & Oncology

## 2011-07-15 ENCOUNTER — Encounter (HOSPITAL_BASED_OUTPATIENT_CLINIC_OR_DEPARTMENT_OTHER): Payer: Medicare Other | Admitting: Hematology & Oncology

## 2011-07-15 DIAGNOSIS — R29818 Other symptoms and signs involving the nervous system: Secondary | ICD-10-CM

## 2011-07-15 DIAGNOSIS — D509 Iron deficiency anemia, unspecified: Secondary | ICD-10-CM

## 2011-07-15 LAB — CBC WITH DIFFERENTIAL (CANCER CENTER ONLY)
BASO#: 0 10*3/uL (ref 0.0–0.2)
BASO%: 0.5 % (ref 0.0–2.0)
EOS%: 4.2 % (ref 0.0–7.0)
Eosinophils Absolute: 0.3 10*3/uL (ref 0.0–0.5)
HCT: 36.7 % (ref 34.8–46.6)
HGB: 12.2 g/dL (ref 11.6–15.9)
LYMPH#: 1.6 10*3/uL (ref 0.9–3.3)
LYMPH%: 20.8 % (ref 14.0–48.0)
MCH: 29.3 pg (ref 26.0–34.0)
MCHC: 33.2 g/dL (ref 32.0–36.0)
MCV: 88 fL (ref 81–101)
MONO#: 0.6 10*3/uL (ref 0.1–0.9)
MONO%: 7.8 % (ref 0.0–13.0)
NEUT#: 5 10*3/uL (ref 1.5–6.5)
NEUT%: 66.7 % (ref 39.6–80.0)
Platelets: 253 10*3/uL (ref 145–400)
RBC: 4.16 10*6/uL (ref 3.70–5.32)
RDW: 13.3 % (ref 11.1–15.7)
WBC: 7.4 10*3/uL (ref 3.9–10.0)

## 2011-07-15 LAB — IRON AND TIBC
%SAT: 18 % — ABNORMAL LOW (ref 20–55)
Iron: 33 ug/dL — ABNORMAL LOW (ref 42–145)
TIBC: 180 ug/dL — ABNORMAL LOW (ref 250–470)
UIBC: 147 ug/dL (ref 125–400)

## 2011-07-15 LAB — CMP (CANCER CENTER ONLY)
ALT(SGPT): 22 U/L (ref 10–47)
AST: 48 U/L — ABNORMAL HIGH (ref 11–38)
Albumin: 2.9 g/dL — ABNORMAL LOW (ref 3.3–5.5)
Alkaline Phosphatase: 124 U/L — ABNORMAL HIGH (ref 26–84)
BUN, Bld: 16 mg/dL (ref 7–22)
CO2: 32 mEq/L (ref 18–33)
Calcium: 9 mg/dL (ref 8.0–10.3)
Chloride: 97 mEq/L — ABNORMAL LOW (ref 98–108)
Creat: 1.2 mg/dl (ref 0.6–1.2)
Glucose, Bld: 86 mg/dL (ref 73–118)
Potassium: 4.4 mEq/L (ref 3.3–4.7)
Sodium: 135 mEq/L (ref 128–145)
Total Bilirubin: 0.5 mg/dl (ref 0.20–1.60)
Total Protein: 6.6 g/dL (ref 6.4–8.1)

## 2011-07-15 LAB — FERRITIN: Ferritin: 136 ng/mL (ref 10–291)

## 2011-07-15 LAB — LACTATE DEHYDROGENASE: LDH: 201 U/L (ref 94–250)

## 2011-07-21 NOTE — Consult Note (Signed)
  NAMERAVNEET, SPILKER NO.:  [id number]  MEDICAL RECORD NO.:  [id number]  LOCATION:  [facility]                         FACILITY:  [facility]  PHYSICIAN:  Zola Button T. Lazarus Salines, M.D. DATE OF BIRTH:  24-Jul-1951  DATE OF CONSULTATION:  06/24/2011 DATE OF DISCHARGE:  06/24/2011                                CONSULTATION   CHIEF COMPLAINT:  Mandible fracture.  HISTORY OF PRESENT ILLNESS:  A 60 year old white female, fell earlier today and struck her chin on concrete.  She sustained a laceration of the chin.  ENT was called in consultation for evaluation of a mandible fracture.  I reviewed the CT scan.  She has a tiny chip fracture of the mentum, probably a continuation of the submental laceration.  There was no significant fracture of the integrity of the arch of the mandible or of the teeth.  Minimal pain.  No trismus.  No other facial injuries except swelling and bruising of the left cheek.  PHYSICAL EXAMINATION:  This is an alert middle-aged white female.  She has some ecchymotic discoloration and swelling of the left cheek.  She has a closed 3-cm laceration of the submental soft tissues.  She has teeth in poor repair, but no obvious acute trauma.  IMPRESSION:  Chin laceration with a chip fracture of the mentum, but no significant fractures of the integrity of the mandible.  PLAN:  Routine wound hygiene.  Return visit in my office in 2 weeks.  I anticipate that this will heal without any therapy.     Erin Donaldson. Lazarus Salines, M.D.     KTW/MEDQ  D:  06/24/2011  T:  06/25/2011  Job:  [id number]  Electronically Signed by Flo Shanks M.D. on 07/21/2011 09:55:25 AM

## 2011-08-05 ENCOUNTER — Other Ambulatory Visit: Payer: Self-pay | Admitting: Internal Medicine

## 2011-08-11 LAB — CREATININE, SERUM
Creatinine, Ser: 1.02
GFR calc Af Amer: >60
GFR calc non Af Amer: 56 — ABNORMAL LOW

## 2011-08-13 LAB — RAPID URINE DRUG SCREEN, HOSP PERFORMED
Amphetamines: POSITIVE — AB
Barbiturates: NOT DETECTED
Benzodiazepines: POSITIVE — AB
Cocaine: NOT DETECTED
Opiates: POSITIVE — AB
Tetrahydrocannabinol: NOT DETECTED

## 2011-08-13 LAB — COMPREHENSIVE METABOLIC PANEL
ALT: 60 — ABNORMAL HIGH
AST: 120 — ABNORMAL HIGH
Albumin: 3.8
Alkaline Phosphatase: 163 — ABNORMAL HIGH
BUN: 16
CO2: 27
Calcium: 9.2
Chloride: 105
Creatinine, Ser: 1.13
GFR calc Af Amer: >60
GFR calc non Af Amer: 50 — ABNORMAL LOW
Glucose, Bld: 152 — ABNORMAL HIGH
Potassium: 4
Sodium: 141
Total Bilirubin: 0.4
Total Protein: 6.7

## 2011-08-13 LAB — CBC
HCT: 42.6
Hemoglobin: 14.4
MCHC: 33.8
MCV: 86.5
Platelets: 245
RBC: 4.92
RDW: 13.2
WBC: 15.1 — ABNORMAL HIGH

## 2011-08-13 LAB — URINALYSIS, ROUTINE W REFLEX MICROSCOPIC
Bilirubin Urine: NEGATIVE
Glucose, UA: NEGATIVE
Ketones, ur: NEGATIVE
Leukocytes, UA: NEGATIVE
Nitrite: NEGATIVE
Protein, ur: 100 — AB
Specific Gravity, Urine: 1.041 — ABNORMAL HIGH
Urobilinogen, UA: 1
pH: 5.5

## 2011-08-13 LAB — ETHANOL: Alcohol, Ethyl (B): <5

## 2011-08-13 LAB — DIFFERENTIAL
Basophils Absolute: 0
Basophils Relative: 0
Eosinophils Absolute: 0
Eosinophils Relative: 0
Lymphocytes Relative: 5 — ABNORMAL LOW
Lymphs Abs: 0.8
Monocytes Absolute: 0.5
Monocytes Relative: 3
Neutro Abs: 13.8 — ABNORMAL HIGH
Neutrophils Relative %: 92 — ABNORMAL HIGH

## 2011-08-13 LAB — URINE MICROSCOPIC-ADD ON

## 2011-08-13 LAB — ACETAMINOPHEN LEVEL: Acetaminophen (Tylenol), Serum: <10 — ABNORMAL LOW

## 2011-08-13 LAB — SALICYLATE LEVEL: Salicylate Lvl: <4

## 2011-08-29 ENCOUNTER — Other Ambulatory Visit: Payer: Self-pay | Admitting: Internal Medicine

## 2011-09-04 ENCOUNTER — Other Ambulatory Visit: Payer: Self-pay | Admitting: Internal Medicine

## 2011-09-14 ENCOUNTER — Ambulatory Visit: Payer: Medicare Other | Admitting: Internal Medicine

## 2011-09-28 ENCOUNTER — Other Ambulatory Visit: Payer: Self-pay | Admitting: Family Medicine

## 2011-09-28 DIAGNOSIS — M5412 Radiculopathy, cervical region: Secondary | ICD-10-CM

## 2011-10-02 ENCOUNTER — Ambulatory Visit
Admission: RE | Admit: 2011-10-02 | Discharge: 2011-10-02 | Disposition: A | Discharge status: Home / Self Care | Payer: Medicare Other | Source: Ambulatory Visit | Attending: Family Medicine | Admitting: Family Medicine

## 2011-10-02 DIAGNOSIS — M5412 Radiculopathy, cervical region: Secondary | ICD-10-CM

## 2011-10-07 ENCOUNTER — Other Ambulatory Visit: Payer: Self-pay | Admitting: Neurosurgery

## 2011-10-07 DIAGNOSIS — G548 Other nerve root and plexus disorders: Secondary | ICD-10-CM

## 2011-10-12 ENCOUNTER — Ambulatory Visit: Payer: Medicare Other | Admitting: Internal Medicine

## 2011-10-13 ENCOUNTER — Ambulatory Visit
Admission: RE | Admit: 2011-10-13 | Discharge: 2011-10-13 | Disposition: A | Discharge status: Home / Self Care | Payer: No Typology Code available for payment source | Source: Ambulatory Visit | Attending: Neurosurgery | Admitting: Neurosurgery

## 2011-10-13 DIAGNOSIS — G548 Other nerve root and plexus disorders: Secondary | ICD-10-CM

## 2011-10-23 ENCOUNTER — Ambulatory Visit: Payer: Medicare Other | Admitting: Internal Medicine

## 2011-12-01 ENCOUNTER — Ambulatory Visit: Payer: Medicare Other | Admitting: Internal Medicine

## 2011-12-15 ENCOUNTER — Ambulatory Visit: Payer: Medicare Other | Admitting: Internal Medicine

## 2012-02-24 ENCOUNTER — Emergency Department (HOSPITAL_COMMUNITY)
Admission: EM | Admit: 2012-02-24 | Discharge: 2012-02-26 | Disposition: A | Discharge status: Other Acute Inpatient Hospital | Payer: Medicare Other | Attending: Emergency Medicine | Admitting: Emergency Medicine

## 2012-02-24 ENCOUNTER — Emergency Department (HOSPITAL_COMMUNITY): Payer: Medicare Other

## 2012-02-24 ENCOUNTER — Encounter (HOSPITAL_COMMUNITY): Payer: Self-pay

## 2012-02-24 DIAGNOSIS — F29 Unspecified psychosis not due to a substance or known physiological condition: Secondary | ICD-10-CM

## 2012-02-24 DIAGNOSIS — I10 Essential (primary) hypertension: Secondary | ICD-10-CM | POA: Insufficient documentation

## 2012-02-24 DIAGNOSIS — IMO0002 Reserved for concepts with insufficient information to code with codable children: Secondary | ICD-10-CM | POA: Insufficient documentation

## 2012-02-24 DIAGNOSIS — E785 Hyperlipidemia, unspecified: Secondary | ICD-10-CM | POA: Insufficient documentation

## 2012-02-24 DIAGNOSIS — F411 Generalized anxiety disorder: Secondary | ICD-10-CM | POA: Insufficient documentation

## 2012-02-24 DIAGNOSIS — E119 Type 2 diabetes mellitus without complications: Secondary | ICD-10-CM | POA: Insufficient documentation

## 2012-02-24 LAB — CBC
HCT: 42.3 % (ref 36.0–46.0)
Hemoglobin: 14.2 g/dL (ref 12.0–15.0)
MCH: 28.7 pg (ref 26.0–34.0)
MCHC: 33.6 g/dL (ref 30.0–36.0)
MCV: 85.5 fL (ref 78.0–100.0)
Platelets: 279 10*3/uL (ref 150–400)
RBC: 4.95 MIL/uL (ref 3.87–5.11)
RDW: 12.3 % (ref 11.5–15.5)
WBC: 13.7 10*3/uL — ABNORMAL HIGH (ref 4.0–10.5)

## 2012-02-24 LAB — URINALYSIS, ROUTINE W REFLEX MICROSCOPIC
Bilirubin Urine: NEGATIVE
Glucose, UA: NEGATIVE mg/dL
Ketones, ur: 40 mg/dL — AB
Leukocytes, UA: NEGATIVE
Nitrite: NEGATIVE
Protein, ur: 30 mg/dL — AB
Specific Gravity, Urine: 1.021 (ref 1.005–1.030)
Urobilinogen, UA: 1 mg/dL (ref 0.0–1.0)
pH: 6.5 (ref 5.0–8.0)

## 2012-02-24 LAB — COMPREHENSIVE METABOLIC PANEL
ALT: 28 U/L (ref 0–35)
AST: 28 U/L (ref 0–37)
Albumin: 4.1 g/dL (ref 3.5–5.2)
Alkaline Phosphatase: 171 U/L — ABNORMAL HIGH (ref 39–117)
BUN: 16 mg/dL (ref 6–23)
CO2: 23 mEq/L (ref 19–32)
Calcium: 9.6 mg/dL (ref 8.4–10.5)
Chloride: 101 mEq/L (ref 96–112)
Creatinine, Ser: 0.82 mg/dL (ref 0.50–1.10)
GFR calc Af Amer: 90 mL/min — ABNORMAL LOW (ref >90)
GFR calc non Af Amer: 77 mL/min — ABNORMAL LOW (ref >90)
Glucose, Bld: 102 mg/dL — ABNORMAL HIGH (ref 70–99)
Potassium: 2.7 mEq/L — CL (ref 3.5–5.1)
Sodium: 138 mEq/L (ref 135–145)
Total Bilirubin: 0.4 mg/dL (ref 0.3–1.2)
Total Protein: 7.3 g/dL (ref 6.0–8.3)

## 2012-02-24 LAB — URINE MICROSCOPIC-ADD ON

## 2012-02-24 LAB — RAPID URINE DRUG SCREEN, HOSP PERFORMED
Amphetamines: NOT DETECTED
Barbiturates: NOT DETECTED
Benzodiazepines: POSITIVE — AB
Cocaine: NOT DETECTED
Opiates: POSITIVE — AB
Tetrahydrocannabinol: NOT DETECTED

## 2012-02-24 LAB — ETHANOL: Alcohol, Ethyl (B): <11 mg/dL (ref 0–11)

## 2012-02-24 MED ORDER — ACETAMINOPHEN 325 MG PO TABS: 650.0000 mg | ORAL_TABLET | ORAL | Status: DC | PRN

## 2012-02-24 MED ORDER — LORAZEPAM 1 MG PO TABS
1.0000 mg | ORAL_TABLET | Freq: Three times a day (TID) | ORAL | Status: DC | PRN
  Filled 2012-02-24: qty 1

## 2012-02-24 MED ORDER — POTASSIUM CHLORIDE CRYS ER 20 MEQ PO TBCR
40.0000 meq | EXTENDED_RELEASE_TABLET | Freq: Once | ORAL | Status: AC
  Administered 2012-02-24: 40 meq via ORAL
  Filled 2012-02-24: qty 2

## 2012-02-24 NOTE — ED Notes (Signed)
All belongings given to husband.

## 2012-02-24 NOTE — ED Notes (Signed)
Per EMS- Patient's family called EMS stating that the patient has gotten progressively confused and combative since Sunday. Patient also has not been eating. Patient denies SI/Hi.

## 2012-02-24 NOTE — ED Notes (Signed)
Bed:WA01<BR> Expected date:<BR> Expected time:<BR> Means of arrival:<BR> Comments:<BR> ems

## 2012-02-24 NOTE — ED Notes (Signed)
Patient  wanded by security and placed in blue scrubs by Real Cons, NT

## 2012-02-24 NOTE — ED Notes (Signed)
[facility] attempting placement for pt, CXR and EKG performed for same. Pt is lying in her bed and makes no attempt to get up. Makes random verbal comments telling staff to "get out get out get out" or holds her arms up in the air in random positions, appearing to be waving at something. Once while tech was placing EKG leads, she held her breath and refused to respond to verbal stimuli. VSS, EKG shows NSR.

## 2012-02-24 NOTE — ED Notes (Signed)
Pt. In blue scrubs and red socks. -- Have both been wanded by secuirty

## 2012-02-24 NOTE — BH Assessment (Signed)
Assessment Note   Erin Donaldson is an 61 y.o. female. Pt presented to the [facility] with a chief complaint of altered mental status. Pt presents as irritable and confused during the assessment with incoherent and tangential speech. Pt has a previous mental health diagnosis of bipolar disorder and was diagnosed with dementia 4 years ago.  Due to pt's condition, she is a poor historian. Pt was unable to recall the year she was born and was not oriented to place, time or situation. Pt began stating that "everyone was dying because she was holding a mouse in her hand." Pt stated that she was having thoughts of suicide with a plan to "throw herself to the floor so that her body would break and bleed out." Pt also stated that she was "having thoughts to kill everyone" though there was no plan. Pt endorses AVH and appears preoccupied during assessment. Pt was unable to elaborate on the AVH. Pt became unresponsive at times during the assessment and therefore family was contacted for further information.   The following collateral information was provided by the pt's husband, Kelita Wallis [phone]). Steward Ros states that the pt has been acting bizarre for the past 2 days.  He stated that on Sunday the pt watched a movie on television about a woman who died and came back to life and since that time "the pt has been playing out the movie". Earnest added that the pt has been "talking about death and stating she can not walk in certain areas of the house". He shared that the pt has been "talking to things that are not there and seeing objects that are not in the home." Per Steward Ros, the pt has not displayed any behaviors similar to this in the past, stating there has been no confusion, agitation delusional behaviors or AVH in the past. He states that the pt has been seeing Dr. Jennelle Human for the past 11 years and there is no history of an inpatient hospitalization. Per Steward Ros, the pt has been taking her medication as  prescribed.   Steward Ros does not have HCPOA and would be unable to sign the pt in for treatment. Pt requires inpatient treatment at this time for safety and stabilization.   Axis I: Bipolar Disorder, Most Recent Episode Manic with Psychotic Features; Dementia NOS Axis II: Deferred Axis III:  Past Medical History  Diagnosis Date  . Migraine   . Bipolar affective   . Anxiety   . Hyperlipidemia   . Hypertension   . GERD (gastroesophageal reflux disease)   . Diabetes in pregnancy   . Diabetes mellitus   . IBS (irritable bowel syndrome)   . Anemia   . Elevated LFTs    Axis IV: problems related to social environment Axis V: 21-30 behavior considerably influenced by delusions or hallucinations OR serious impairment in judgment, communication OR inability to function in almost all areas  Past Medical History:  Past Medical History  Diagnosis Date  . Migraine   . Bipolar affective   . Anxiety   . Hyperlipidemia   . Hypertension   . GERD (gastroesophageal reflux disease)   . Diabetes in pregnancy   . Diabetes mellitus   . IBS (irritable bowel syndrome)   . Anemia   . Elevated LFTs     Past Surgical History  Procedure Date  . Cholecystectomy   . Gastric bypass 2005    Family History:  Family History  Problem Relation Age of Onset  . Kidney disease Father   .  Heart disease Father   . Heart disease Mother   . Heart disease Maternal Grandfather   . Heart disease Brother   . Heart disease Maternal Aunt   . Heart disease Maternal Uncle   . Heart disease Paternal Aunt   . Heart disease Paternal Uncle   . Breast cancer Mother   . Colon cancer Neg Hx   . Diabetes Father   . Diabetes Maternal Aunt   . Diabetes Maternal Uncle   . Diabetes Paternal Aunt   . Diabetes Paternal Uncle   . Cancer Mother     bladder    Social History:  reports that she has never smoked. She does not have any smokeless tobacco history on file. She reports that she does not drink alcohol or use  illicit drugs.  Additional Social History:    Allergies: No Known Allergies  Home Medications:  Medications Prior to Admission  Medication Dose Route Frequency Provider Last Rate Last Dose  . potassium chloride SA (K-DUR,KLOR-CON) CR tablet 40 mEq  40 mEq Oral Once Gerhard Munch, MD   40 mEq at 02/24/12 1827   Medications Prior to Admission  Medication Sig Dispense Refill  . gabapentin (NEURONTIN) 600 MG tablet Take 600 mg by mouth 3 (three) times daily.      Marland Kitchen lamoTRIgine (LAMICTAL) 200 MG tablet Take 200 mg by mouth daily.      Marland Kitchen omeprazole (PRILOSEC) 40 MG capsule Take 80 mg by mouth daily.      . QUEtiapine (SEROQUEL) 400 MG tablet Take 400 mg by mouth at bedtime.      . rivastigmine (EXELON) 9.5 mg/24hr Place 1 patch onto the skin daily.      . rizatriptan (MAXALT) 10 MG tablet Take 10 mg by mouth as needed. May repeat in 2 hours if needed      . selegiline (EMSAM) 9 MG/24HR Place 1 patch onto the skin daily.      Marland Kitchen torsemide (DEMADEX) 20 MG tablet Take 20 mg by mouth daily.        OB/GYN Status:  No LMP recorded. Patient is postmenopausal.  General Assessment Data Location of Assessment: [facility] Living Arrangements: Spouse/significant other Can pt return to current living arrangement?: Yes Admission Status: Voluntary Is patient capable of signing voluntary admission?: No Transfer from: Acute Hospital Referral Source: Self/Family/Friend (Pt's husband Kathie Posa)  Education Status Is patient currently in school?: No  Risk to self Suicidal Ideation: Yes-Currently Present Suicidal Intent: No-Not Currently/Within Last 6 Months Is patient at risk for suicide?: No Suicidal Plan?: Yes-Currently Present Specify Current Suicidal Plan: "I will throw myself in the floor to break my body" Access to Means: Yes Specify Access to Suicidal Means: pt can make herself fall What has been your use of drugs/alcohol within the last 12 months?: pt denies Previous Attempts/Gestures:  No How many times?: 0  Other Self Harm Risks: none reported Triggers for Past Attempts: None known Intentional Self Injurious Behavior: None (none reported) Family Suicide History: Unable to assess Recent stressful life event(s): Other (Comment) (none noted by pt or husband) Persecutory voices/beliefs?: No Depression: Yes Depression Symptoms: Insomnia;Feeling angry/irritable Substance abuse history and/or treatment for substance abuse?: No Suicide prevention information given to non-admitted patients: Not applicable  Risk to Others Homicidal Ideation: Yes-Currently Present Thoughts of Harm to Others: Yes-Currently Present Comment - Thoughts of Harm to Others: pt states she has "thoughts to kill everyone" Current Homicidal Intent: No-Not Currently/Within Last 6 Months Current Homicidal Plan: No-Not Currently/Within Last  6 Months Access to Homicidal Means: No Identified Victim: "everyone" History of harm to others?: No Assessment of Violence: None Noted Violent Behavior Description: pt is calm during assessment Does patient have access to weapons?: No Criminal Charges Pending?: No Does patient have a court date: No  Psychosis Hallucinations: Auditory;Visual (per husband-pt has been responding to internal stimuli) Delusions: Unspecified (per husband pt has been playing out scenes from a movie")  Mental Status Report Appear/Hygiene: Disheveled;Bizarre Eye Contact: Poor Motor Activity: Rigidity Speech: Incoherent;Tangential Level of Consciousness: Quiet/awake Mood: Labile;Preoccupied Affect: Labile;Preoccupied Anxiety Level: None Thought Processes: Irrelevant;Tangential Judgement: Impaired Orientation: Person Obsessive Compulsive Thoughts/Behaviors: None  Cognitive Functioning Concentration: Decreased Memory: Recent Impaired;Remote Impaired IQ: Average Insight: Poor Impulse Control: Poor Appetite: Poor Weight Loss: 0  Weight Gain: 0  Sleep: Decreased Total Hours of  Sleep: 2  (2 hours nightly for past week) Vegetative Symptoms: None  Prior Inpatient Therapy Prior Inpatient Therapy: No Prior Therapy Dates: none reported Prior Therapy Facilty/Provider(s): none reported Reason for Treatment: n/a  Prior Outpatient Therapy Prior Outpatient Therapy: Yes Prior Therapy Dates: 11+ yrs  Prior Therapy Facilty/Provider(s): Dr. Jennelle Human - [facility] Reason for Treatment: bipolar disorder/dementia            Values / Beliefs Cultural Requests During Hospitalization: None Spiritual Requests During Hospitalization: None        Additional Information 1:1 In Past 12 Months?: No CIRT Risk: No Elopement Risk: No Does patient have medical clearance?: Yes     Disposition:  Disposition Disposition of Patient: Inpatient treatment program;Referred to [facility]) Type of inpatient treatment program: Adult Patient referred to: Other (Comment) [facility])  On Site Evaluation by:   Reviewed with Physician:     [place] Crane F 02/24/2012 11:05 PM

## 2012-02-24 NOTE — ED Notes (Signed)
Pt. Inventory: 1 pair of shoes, 1 pair of shorts, 1 pair of underwear, 2 shirts. 3 yellow bracelets and 1 yellow necklace. All belongings are in 1 bag.

## 2012-02-24 NOTE — ED Notes (Signed)
Pt pending [facility] for possible disposition.

## 2012-02-24 NOTE — ED Notes (Signed)
Pt. went to restroom with 1 assist. Unable to void. RN Velna Hatchet notified.

## 2012-02-24 NOTE — ED Notes (Signed)
Patient's belongings bag x 1  Locked in cabinet in the room.

## 2012-02-24 NOTE — ED Notes (Signed)
MD at bedside. 

## 2012-02-24 NOTE — ED Provider Notes (Signed)
History     CSN: [id number]  Arrival date & time 02/24/12  1408   First MD Initiated Contact with Patient 02/24/12 1516      No chief complaint on file.   (Consider location/radiation/quality/duration/timing/severity/associated sxs/prior treatment) HPI The patient presents with her husband and concerns over new changes in behavior.  She has a history of bipolar disease as well as dementia.  She has no history of similar episodes of altered mental status as today's complaints.  Relatively acutely, 2 days ago, the patient started to have a flat affect, speech is tangential he and physically, and soft interacting appropriately.  This history of present illness is per the patient's husband.  Patient cannot give an accurate counting of her condition.  Per the husband the patient continues to take her medication as directed, and has had no fevers, cough, overt evidence of infection. Past Medical History  Diagnosis Date  . Migraine   . Bipolar affective   . Anxiety   . Hyperlipidemia   . Hypertension   . GERD (gastroesophageal reflux disease)   . Diabetes in pregnancy   . Diabetes mellitus   . IBS (irritable bowel syndrome)   . Anemia   . Elevated LFTs     Past Surgical History  Procedure Date  . Cholecystectomy   . Gastric bypass 2005    Family History  Problem Relation Age of Onset  . Kidney disease Father   . Heart disease Father   . Heart disease Mother   . Heart disease Maternal Grandfather   . Heart disease Brother   . Heart disease Maternal Aunt   . Heart disease Maternal Uncle   . Heart disease Paternal Aunt   . Heart disease Paternal Uncle   . Breast cancer Mother   . Colon cancer Neg Hx   . Diabetes Father   . Diabetes Maternal Aunt   . Diabetes Maternal Uncle   . Diabetes Paternal Aunt   . Diabetes Paternal Uncle   . Cancer Mother     bladder    History  Substance Use Topics  . Smoking status: Never Smoker   . Smokeless tobacco: Not on file  . Alcohol  Use: No     rare    OB History    Grav Para Term Preterm Abortions TAB SAB Ect Mult Living                  Review of Systems  Unable to perform ROS: Mental status change    Allergies  Review of patient's allergies indicates no known allergies.  Home Medications   Current Outpatient Rx  Name Route Sig Dispense Refill  . GABAPENTIN 600 MG PO TABS Oral Take 600 mg by mouth 3 (three) times daily.    Marland Kitchen LAMOTRIGINE 200 MG PO TABS Oral Take 200 mg by mouth daily.    Marland Kitchen OMEPRAZOLE 40 MG PO CPDR Oral Take 80 mg by mouth daily.    . QUETIAPINE FUMARATE 400 MG PO TABS Oral Take 400 mg by mouth at bedtime.    Marland Kitchen RIVASTIGMINE 9.5 MG/24HR TD PT24 Transdermal Place 1 patch onto the skin daily.    Marland Kitchen RIZATRIPTAN BENZOATE 10 MG PO TABS Oral Take 10 mg by mouth as needed. May repeat in 2 hours if needed    . SELEGILINE 9 MG/24HR TD PT24 Transdermal Place 1 patch onto the skin daily.    . TORSEMIDE 20 MG PO TABS Oral Take 20 mg by mouth daily.  BP 97/65  Pulse 101  Temp(Src) 98.6 F (37 C) (Oral)  Resp 16  SpO2 94%  Physical Exam  Nursing note and vitals reviewed. Constitutional: She is oriented to person, place, and time. She appears well-developed and well-nourished. No distress.  HENT:  Head: Normocephalic and atraumatic.  Eyes: Conjunctivae and EOM are normal.  Cardiovascular: Normal rate and regular rhythm.   Pulmonary/Chest: Effort normal and breath sounds normal. No stridor. No respiratory distress.  Abdominal: She exhibits no distension.  Musculoskeletal: She exhibits no edema.  Neurological: She is alert and oriented to person, place, and time. No cranial nerve deficit.  Skin: Skin is warm and dry.       Upper extremity ecchymotic marks, which the patient's husband states have been there for ages. There are two new areas of ecchymotic skin tears on the L forearm.  No active bleeding.  Psychiatric: Her mood appears anxious. Her affect is blunt. Her speech is rapid and/or  pressured, delayed and tangential. She is agitated, is hyperactive and slowed. Thought content is delusional. Cognition and memory are impaired. She is inattentive.    ED Course  Procedures (including critical care time)   Labs Reviewed  URINE RAPID DRUG SCREEN (HOSP PERFORMED)  CBC  COMPREHENSIVE METABOLIC PANEL  ETHANOL  URINALYSIS, ROUTINE W REFLEX MICROSCOPIC   No results found.   No diagnosis found.    MDM  This elderly female with a history of bipolar disease now presents with new atypical behavior.  On exam the patient is in no distress, though she has a very flat affect, is lying relatively still, is speaking tangentially.  The patient's vital signs, labs are largely reassuring.  The patient was medically cleared for further evaluation by the behavioral health team.       Gerhard Munch, MD 02/24/12 2307

## 2012-02-24 NOTE — ED Notes (Signed)
Pt.'s husband in waiting room. RN Velna Hatchet approved he could go back to to visit pt. -- Security wanded pt. and they also escorted husband back to pt's room.

## 2012-02-24 NOTE — ED Notes (Signed)
Patient called for nurse and upon arrival stated "There are white boxes and black boxes and people get in the middle and do not know where to go."

## 2012-02-25 DIAGNOSIS — F411 Generalized anxiety disorder: Secondary | ICD-10-CM

## 2012-02-25 DIAGNOSIS — F29 Unspecified psychosis not due to a substance or known physiological condition: Secondary | ICD-10-CM

## 2012-02-25 MED ORDER — LORAZEPAM 2 MG/ML IJ SOLN
2.0000 mg | Freq: Once | INTRAMUSCULAR | Status: AC
  Administered 2012-02-25: 2 mg via INTRAMUSCULAR
  Filled 2012-02-25: qty 1

## 2012-02-25 NOTE — ED Notes (Signed)
Pt.'s family called to speak with pt.  Pt. Responded "no".  Family informed.  Family requested info on picking pt. Up and taking her home.  Per [facility]/EDP, if they have guardianship of pt. They could take her home AMA.  [facility] psych Dr. Elsie Saas spoke with pt. And pt. Informed him that she did not want to go home and felt depressed and wanted help.  Pt.'s family became upset and stated that they spoke with a lawyer about getting medical health care power of attorney for their mother and wanted to know if [facility] had a notary to sign their paper, informed family that we do not have a notary that is able to assist the public.  Daughter became very upset and stated that we have "doped her all up" and hung up on nurse.

## 2012-02-25 NOTE — ED Provider Notes (Signed)
BP 163/94  Pulse 82  Temp(Src) 98.3 F (36.8 C) (Oral)  Resp 18  SpO2 97% Resting comfortably. No issues overnight. Pending placement  Loren Racer, MD 02/25/12 [phone]

## 2012-02-25 NOTE — ED Notes (Signed)
Pt attemping to climb out of head of bed head first, pt. Was 1/2 out of bed with her head dangling toward flr.  With assistance able to place pt. Back in bed.  Pt. Continues to be monitored, has L/R bumper guards of sides of bed, and has sitter in room.  Attempted to give pt.  PO medication to calm her down, pt. Closed mouth and refused to take pill.  EDP notified, order given for IM Ativan.  Medication given.

## 2012-02-25 NOTE — ED Notes (Signed)
Pt. In bed hitting L/R arms against bed rails, pt. Torn a bruise on L. Forearm open, wound cleaned with 0.9% Sodium Chloride, dried and covered with Tegaderm.  Charge RN notifed, bumper pads placed on L/R sides of pt.'s bed.

## 2012-02-25 NOTE — Consult Note (Signed)
Reason for Consult: Depression and suicidal ideation Referring Physician: Ranae Palms, MD  Erin Donaldson is an 61 y.o. female.  HPI: Patient has been suffering with the chronic the depression and the dementia not otherwise specified. Patient was brought in today [facility] emergency department for the symptoms of for and depression and suicidal thoughts. Patient reportedly has been increase the depression recently with the low energy isolated, withdrawn and unhappy. Patient has been less less functional at home. Patient was refusing to talk to the family members and the events staff members in the unit. Patient has the refuse to talk but she was there able to open her eyes and not or had reporting she has been depressed and suicidal she want to get help from the hospital she does not want to be discharged home even though her family members are requesting. Patient was found by the staff she has been hitting her right hand with the rails and required skin treatment including Band-Aid.  Mental status examination: Patient was lying down on her bed to turn her head on the other side she was poorly reactive to the evaluation and of frequent earaches as she was able to town and does squeeze the hand she was awake and alert but not showing the interest for the interactions. She nodded her head during this evaluation she could not answer most of the questions so difficult to complete these mental status examination at this time.  Past Medical History  Diagnosis Date  . Migraine   . Bipolar affective   . Anxiety   . Hyperlipidemia   . Hypertension   . GERD (gastroesophageal reflux disease)   . Diabetes in pregnancy   . Diabetes mellitus   . IBS (irritable bowel syndrome)   . Anemia   . Elevated LFTs     Past Surgical History  Procedure Date  . Cholecystectomy   . Gastric bypass 2005    Family History  Problem Relation Age of Onset  . Kidney disease Father   . Heart disease Father     . Heart disease Mother   . Heart disease Maternal Grandfather   . Heart disease Brother   . Heart disease Maternal Aunt   . Heart disease Maternal Uncle   . Heart disease Paternal Aunt   . Heart disease Paternal Uncle   . Breast cancer Mother   . Colon cancer Neg Hx   . Diabetes Father   . Diabetes Maternal Aunt   . Diabetes Maternal Uncle   . Diabetes Paternal Aunt   . Diabetes Paternal Uncle   . Cancer Mother     bladder    Social History:  reports that she has never smoked. She does not have any smokeless tobacco history on file. She reports that she does not drink alcohol or use illicit drugs.  Allergies: No Known Allergies  Medications: I have reviewed the patient's current medications.  Results for orders placed during the hospital encounter of 02/24/12 (from the past 48 hour(s))  CBC     Status: Abnormal   Collection Time   02/24/12  4:20 PM      Component Value Range Comment   WBC 13.7 (*) 4.0 - 10.5 (K/uL)    RBC 4.95  3.87 - 5.11 (MIL/uL)    Hemoglobin 14.2  12.0 - 15.0 (g/dL)    HCT 16.1  09.6 - 04.5 (%)    MCV 85.5  78.0 - 100.0 (fL)    MCH 28.7  26.0 -  34.0 (pg)    MCHC 33.6  30.0 - 36.0 (g/dL)    RDW 40.9  81.1 - 91.4 (%)    Platelets 279  150 - 400 (K/uL)   ETHANOL     Status: Normal   Collection Time   02/24/12  5:15 PM      Component Value Range Comment   Alcohol, Ethyl (B) <11  0 - 11 (mg/dL)   COMPREHENSIVE METABOLIC PANEL     Status: Abnormal   Collection Time   02/24/12  5:15 PM      Component Value Range Comment   Sodium 138  135 - 145 (mEq/L)    Potassium 2.7 (*) 3.5 - 5.1 (mEq/L)    Chloride 101  96 - 112 (mEq/L)    CO2 23  19 - 32 (mEq/L)    Glucose, Bld 102 (*) 70 - 99 (mg/dL)    BUN 16  6 - 23 (mg/dL)    Creatinine, Ser 7.82  0.50 - 1.10 (mg/dL)    Calcium 9.6  8.4 - 10.5 (mg/dL)    Total Protein 7.3  6.0 - 8.3 (g/dL)    Albumin 4.1  3.5 - 5.2 (g/dL)    AST 28  0 - 37 (U/L)    ALT 28  0 - 35 (U/L)    Alkaline Phosphatase 171 (*) 39  - 117 (U/L)    Total Bilirubin 0.4  0.3 - 1.2 (mg/dL)    GFR calc non Af Amer 77 (*) >90 (mL/min)    GFR calc Af Amer 90 (*) >90 (mL/min)   URINE RAPID DRUG SCREEN (HOSP PERFORMED)     Status: Abnormal   Collection Time   02/24/12  6:21 PM      Component Value Range Comment   Opiates POSITIVE (*) NONE DETECTED     Cocaine NONE DETECTED  NONE DETECTED     Benzodiazepines POSITIVE (*) NONE DETECTED     Amphetamines NONE DETECTED  NONE DETECTED     Tetrahydrocannabinol NONE DETECTED  NONE DETECTED     Barbiturates NONE DETECTED  NONE DETECTED    URINALYSIS, ROUTINE W REFLEX MICROSCOPIC     Status: Abnormal   Collection Time   02/24/12  6:21 PM      Component Value Range Comment   Color, Urine YELLOW  YELLOW     APPearance CLEAR  CLEAR     Specific Gravity, Urine 1.021  1.005 - 1.030     pH 6.5  5.0 - 8.0     Glucose, UA NEGATIVE  NEGATIVE (mg/dL)    Hgb urine dipstick TRACE (*) NEGATIVE     Bilirubin Urine NEGATIVE  NEGATIVE     Ketones, ur 40 (*) NEGATIVE (mg/dL)    Protein, ur 30 (*) NEGATIVE (mg/dL)    Urobilinogen, UA 1.0  0.0 - 1.0 (mg/dL)    Nitrite NEGATIVE  NEGATIVE     Leukocytes, UA NEGATIVE  NEGATIVE    URINE MICROSCOPIC-ADD ON     Status: Normal   Collection Time   02/24/12  6:21 PM      Component Value Range Comment   WBC, UA 3-6  <3 (WBC/hpf)    Bacteria, UA RARE  RARE      Ct Head Wo Contrast  02/24/2012  *RADIOLOGY REPORT*  Clinical Data: Altered mental status.  CT HEAD WITHOUT CONTRAST  Technique:  Contiguous axial images were obtained from the base of the skull through the vertex without contrast.  Comparison: 06/24/2011  Findings: No acute  intracranial abnormality.  Specifically, no hemorrhage, hydrocephalus, mass lesion, acute infarction, or significant intracranial injury.  No acute calvarial abnormality. Visualized paranasal sinuses and mastoids clear.  Orbital soft tissues unremarkable.  IMPRESSION: Normal study.  Original Report Authenticated By: Cyndie Chime, M.D.   Dg Chest Portable 1 View  02/24/2012  *RADIOLOGY REPORT*  Clinical Data: Altered mental status.  PORTABLE CHEST - 1 VIEW  Comparison: 03/31/2010.  Findings: Increased markings medial aspect right upper lung zone may represent confluence of shadows.  Follow-up two-view chest recommended for further delineation when the patient is able to exclude mass or infiltrate.  Mild cardiomegaly.  Pulmonary vascular prominence most notable centrally.  No pneumothorax.  IMPRESSION: Increased markings medial aspect right upper lung zone.  Attention to this on follow-up two-view chest.  Original Report Authenticated By: Fuller Canada, M.D.    No psychosis and Positive for anxiety, depression and dementia Blood pressure 133/84, pulse 83, temperature 97.9 F (36.6 C), temperature source Oral, resp. rate 18, SpO2 97.00%.   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   Nalina Yeatman,JANARDHAHA R. 02/25/2012, 6:15 PM

## 2012-02-25 NOTE — ED Notes (Signed)
Pt. Continues to attempt to hurt herself, hitting arm to break open bruises, scratching NT, undirectable for safety.  [employer] notified, Restraint order obtained, pt. Placed in restraints for her own safety.

## 2012-02-26 MED ORDER — LORAZEPAM 2 MG/ML IJ SOLN
INTRAMUSCULAR | Status: AC
  Administered 2012-02-26: 2 mg via INTRAMUSCULAR
  Filled 2012-02-26: qty 1

## 2012-02-26 MED ORDER — LORAZEPAM 2 MG/ML IJ SOLN
2.0000 mg | Freq: Once | INTRAMUSCULAR | Status: AC
  Administered 2012-02-26: 2 mg via INTRAMUSCULAR

## 2012-02-26 NOTE — BH Assessment (Signed)
Assessment Note  Pt has been accepted at [facility] by Dr. Yetta Barre, per [facility].    Erin Donaldson A 02/26/2012 3:43 AM

## 2012-02-26 NOTE — ED Notes (Signed)
Pt remains confused and disoriented, yelling out at times. Staff offers liquids and pt takes a sip or two and then refuses. Staff attempted to remove restraints, but as soon as 2 of the 4 were removed, pt began kicking and banging legs on the side of the bed. Extremeties released one at time as ROM performed, as pt would allow. Refuses need for bathroom, refuses to allow staff to help her OOB, just starts trying to bang extrem against bed. Restraints secure and padding in place to rails. Pt's husband has talked to staff and SW and is aware of pt condition. Pt continues to refuse phone calls. 1:1 sitter present at bedside for pt safety.

## 2012-02-26 NOTE — Consult Note (Signed)
Reason for Consult: Depression and suicidal thoughts Referring Physician: [name], MD  Erin Donaldson is an 61 y.o. female.  HPI: Patient continued to be depressed and suicidal. She has been emotional the disturbed patient does not revealed the suicidal plans at this time. She is willing to be admitted to the hospital for further psychiatric care.  Past Medical History  Diagnosis Date  . Migraine   . Bipolar affective   . Anxiety   . Hyperlipidemia   . Hypertension   . GERD (gastroesophageal reflux disease)   . Diabetes in pregnancy   . Diabetes mellitus   . IBS (irritable bowel syndrome)   . Anemia   . Elevated LFTs     Past Surgical History  Procedure Date  . Cholecystectomy   . Gastric bypass 2005    Family History  Problem Relation Age of Onset  . Kidney disease Father   . Heart disease Father   . Heart disease Mother   . Heart disease Maternal Grandfather   . Heart disease Brother   . Heart disease Maternal Aunt   . Heart disease Maternal Uncle   . Heart disease Paternal Aunt   . Heart disease Paternal Uncle   . Breast cancer Mother   . Colon cancer Neg Hx   . Diabetes Father   . Diabetes Maternal Aunt   . Diabetes Maternal Uncle   . Diabetes Paternal Aunt   . Diabetes Paternal Uncle   . Cancer Mother     bladder    Social History:  reports that she has never smoked. She does not have any smokeless tobacco history on file. She reports that she does not drink alcohol or use illicit drugs.  Allergies: No Known Allergies  Medications: I have reviewed the patient's current medications.  Results for orders placed during the hospital encounter of 02/24/12 (from the past 48 hour(s))  CBC     Status: Abnormal   Collection Time   02/24/12  4:20 PM      Component Value Range Comment   WBC 13.7 (*) 4.0 - 10.5 (K/uL)    RBC 4.95  3.87 - 5.11 (MIL/uL)    Hemoglobin 14.2  12.0 - 15.0 (g/dL)    HCT 04.5  40.9 - 81.1 (%)    MCV 85.5  78.0 - 100.0 (fL)    MCH 28.7   26.0 - 34.0 (pg)    MCHC 33.6  30.0 - 36.0 (g/dL)    RDW 91.4  78.2 - 95.6 (%)    Platelets 279  150 - 400 (K/uL)   ETHANOL     Status: Normal   Collection Time   02/24/12  5:15 PM      Component Value Range Comment   Alcohol, Ethyl (B) <11  0 - 11 (mg/dL)   COMPREHENSIVE METABOLIC PANEL     Status: Abnormal   Collection Time   02/24/12  5:15 PM      Component Value Range Comment   Sodium 138  135 - 145 (mEq/L)    Potassium 2.7 (*) 3.5 - 5.1 (mEq/L)    Chloride 101  96 - 112 (mEq/L)    CO2 23  19 - 32 (mEq/L)    Glucose, Bld 102 (*) 70 - 99 (mg/dL)    BUN 16  6 - 23 (mg/dL)    Creatinine, Ser 2.13  0.50 - 1.10 (mg/dL)    Calcium 9.6  8.4 - 10.5 (mg/dL)    Total Protein 7.3  6.0 - 8.3 (  g/dL)    Albumin 4.1  3.5 - 5.2 (g/dL)    AST 28  0 - 37 (U/L)    ALT 28  0 - 35 (U/L)    Alkaline Phosphatase 171 (*) 39 - 117 (U/L)    Total Bilirubin 0.4  0.3 - 1.2 (mg/dL)    GFR calc non Af Amer 77 (*) >90 (mL/min)    GFR calc Af Amer 90 (*) >90 (mL/min)   URINE RAPID DRUG SCREEN (HOSP PERFORMED)     Status: Abnormal   Collection Time   02/24/12  6:21 PM      Component Value Range Comment   Opiates POSITIVE (*) NONE DETECTED     Cocaine NONE DETECTED  NONE DETECTED     Benzodiazepines POSITIVE (*) NONE DETECTED     Amphetamines NONE DETECTED  NONE DETECTED     Tetrahydrocannabinol NONE DETECTED  NONE DETECTED     Barbiturates NONE DETECTED  NONE DETECTED    URINALYSIS, ROUTINE W REFLEX MICROSCOPIC     Status: Abnormal   Collection Time   02/24/12  6:21 PM      Component Value Range Comment   Color, Urine YELLOW  YELLOW     APPearance CLEAR  CLEAR     Specific Gravity, Urine 1.021  1.005 - 1.030     pH 6.5  5.0 - 8.0     Glucose, UA NEGATIVE  NEGATIVE (mg/dL)    Hgb urine dipstick TRACE (*) NEGATIVE     Bilirubin Urine NEGATIVE  NEGATIVE     Ketones, ur 40 (*) NEGATIVE (mg/dL)    Protein, ur 30 (*) NEGATIVE (mg/dL)    Urobilinogen, UA 1.0  0.0 - 1.0 (mg/dL)    Nitrite NEGATIVE   NEGATIVE     Leukocytes, UA NEGATIVE  NEGATIVE    URINE MICROSCOPIC-ADD ON     Status: Normal   Collection Time   02/24/12  6:21 PM      Component Value Range Comment   WBC, UA 3-6  <3 (WBC/hpf)    Bacteria, UA RARE  RARE      Ct Head Wo Contrast  02/24/2012  *RADIOLOGY REPORT*  Clinical Data: Altered mental status.  CT HEAD WITHOUT CONTRAST  Technique:  Contiguous axial images were obtained from the base of the skull through the vertex without contrast.  Comparison: 06/24/2011  Findings: No acute intracranial abnormality.  Specifically, no hemorrhage, hydrocephalus, mass lesion, acute infarction, or significant intracranial injury.  No acute calvarial abnormality. Visualized paranasal sinuses and mastoids clear.  Orbital soft tissues unremarkable.  IMPRESSION: Normal study.  Original Report Authenticated By: Cyndie Chime, M.D.   Dg Chest Portable 1 View  02/24/2012  *RADIOLOGY REPORT*  Clinical Data: Altered mental status.  PORTABLE CHEST - 1 VIEW  Comparison: 03/31/2010.  Findings: Increased markings medial aspect right upper lung zone may represent confluence of shadows.  Follow-up two-view chest recommended for further delineation when the patient is able to exclude mass or infiltrate.  Mild cardiomegaly.  Pulmonary vascular prominence most notable centrally.  No pneumothorax.  IMPRESSION: Increased markings medial aspect right upper lung zone.  Attention to this on follow-up two-view chest.  Original Report Authenticated By: Fuller Canada, M.D.    Positive for bad mood, depression and mood swings Blood pressure 155/92, pulse 104, temperature 97.9 F (36.6 C), temperature source Oral, resp. rate 95, SpO2 95.00%.   Assessment/Plan:  Maj. depressive disorder with a melancholic features, recurrent severe without psychotic features  Dementia not  otherwise specified  Recommended admission to the acute psychiatric hospitalization for stabilization of the mood and the suicidal  thoughts. Patient was accepted at geropsych unit at [place], [place] and will be transferred.   Tamarcus Condie,JANARDHAHA R. 02/26/2012, 11:16 AM

## 2012-02-26 NOTE — ED Notes (Signed)
Sitter remains at bedside for safety. Pt continues to receive ROM to extrem as she will allow it. Continues to yell out at random intervals. Tries to bite self at times.

## 2012-02-26 NOTE — ED Notes (Signed)
Pt having increased agitation, yelling out, cursing and trying to cause self injury by banging extremities, trying to bite self and flailing her head back against the padded stretcher. Restraints remain in place and Ativan 2 mg given per order to prevent self harm. Sitter remains at bedside.

## 2012-02-26 NOTE — ED Notes (Signed)
Pt remains in restraints when this nurse comes on duty. Pt is restless, fidgeting and trying to bite her fingers. Pt refuses PO meds, refuses to get OOB for trip to the bathroom. Multiple dark bruising and skin tears noted to bilat arms as they were on admission to psych ED, new tegaderm in place from previous shift.

## 2012-02-26 NOTE — ED Notes (Signed)
Report  Called to [facility] at [place], [place] and sheriff here to transport pt. To [place].

## 2012-02-26 NOTE — ED Notes (Signed)
Pt continues to refuse food but drinks another water. Pt tried to scratch herself when restraint on left arm was removed for ROM.

## 2012-02-26 NOTE — ED Notes (Signed)
Pt remains in restraints. Continues to be restless, confused, delusional. Talks to herself, yells out at random intervals. Refuses all food offered, only takes small sips of fluids. Restraints released one at a time for ROM as pt will allow. Unable to leave restraints off, as pt immediately begins flailing extremeties. 1:1 sitter remains at bedside.

## 2012-02-26 NOTE — ED Notes (Signed)
Attempted to call report to [place], spoke with Kathie Rhodes, RN will call me for report, informed them that the pt. Will be picked up at [facility] at 1015.  Gave them my contact info, they will call me back for report.

## 2012-02-26 NOTE — ED Notes (Signed)
Pt's mouth is dry and staff convinced her to drink of water. Vaseline applied to lips. Continues to deny need to go to bathroom, absorbant pads placed under pt for precaution. Refuses offers for food.

## 2012-02-26 NOTE — ED Notes (Signed)
Pt.'s husband states that he took all pt. Belongings home.  Notified [place] of this development.

## 2012-02-26 NOTE — ED Notes (Signed)
Attempted to release pt arms to try and discontinue restraints. Pt immediately began banging arm on siderail and trying to bite her fingers. Restraints replaced. Pt continues to deny need to go to bathroom.

## 2012-02-26 NOTE — ED Provider Notes (Signed)
7:04 AM Accepted to [facility] by Dr Evelena Peat Vitals:   02/26/12 0600  BP: 155/94  Pulse: 100  Temp:   Resp: 18     Lyanne Co, MD 02/26/12 [phone]

## 2012-02-26 NOTE — ED Notes (Signed)
Pt's husband appeared at [facility] ED without any prior phone calls.  Family was asked to please call prior to driving to [facility] r/t pt. Stating that she did not want to see them or speak with them on the phone.  Husband was informed that pt. Is not a pt. At [facility] at 1200 by pt's RN.  Due to hippa laws explained this and gave husband a list of possible hospitals his wife may have been taken to.  Husband took the list and stated that he would make a few calls.  Husband also stated that he was told when they brought his wife in by the ACT team that they would let him know the status of his wife.  Pt's daughter called the psych ed yelling about when her mother left, informed daughter that due to hippa laws, I could only let her know if her mother was here or not.  Advised daughter to speak with her father about where her mother was.  Daughter continued to yell that we have not heard the last of her and she would have our jobs.  Asked daughter to please stop yelling, daughter hung up.

## 2012-11-22 IMAGING — US US ABDOMEN COMPLETE
1 series · 14 of 25 positions shown · non-contrast
Comparison: None available

CLINICAL DATA: Elevated LFTs.  Previous cholecystectomy, gastric
bypass

COMPLETE ABDOMINAL ULTRASOUND

[Series 1: us abdomen complete · 0.32mm/px · 14 of 58 slices shown]
[im 1/58]
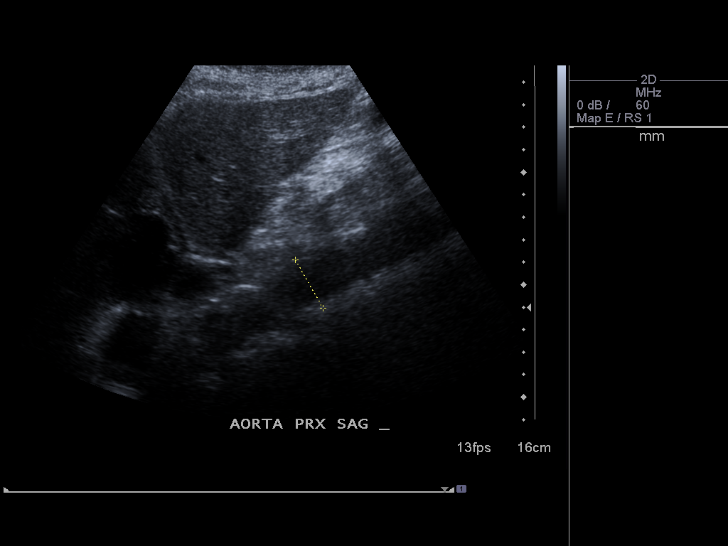
[im 5/58]
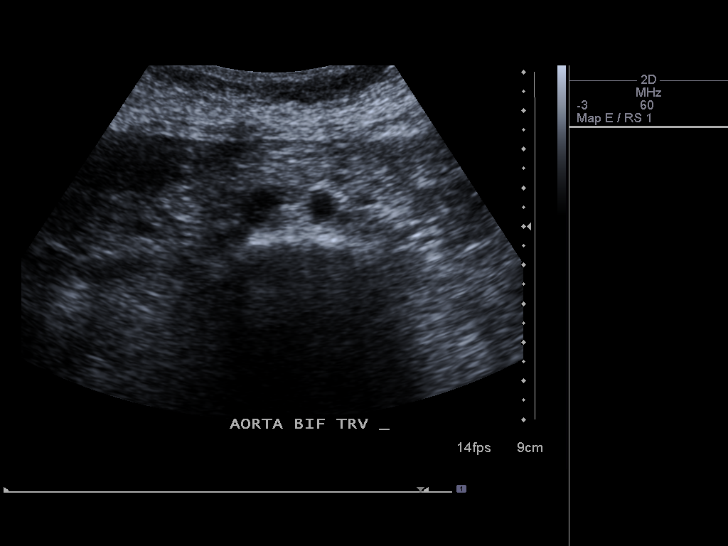
[im 10/58]
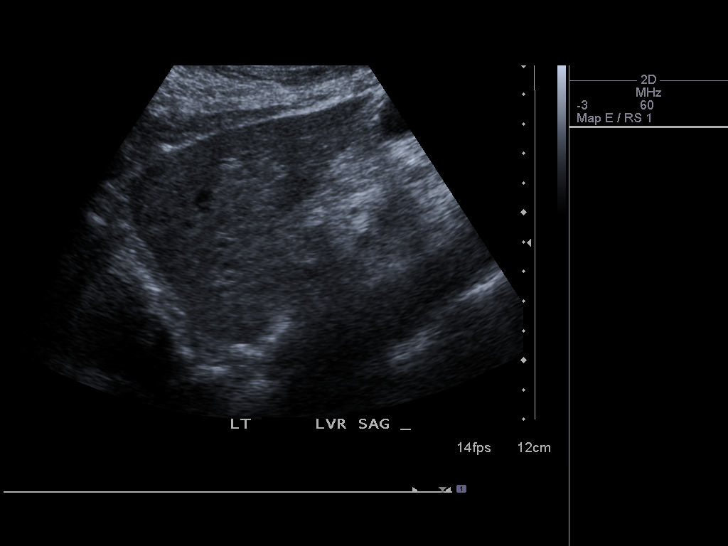
[im 15/58]
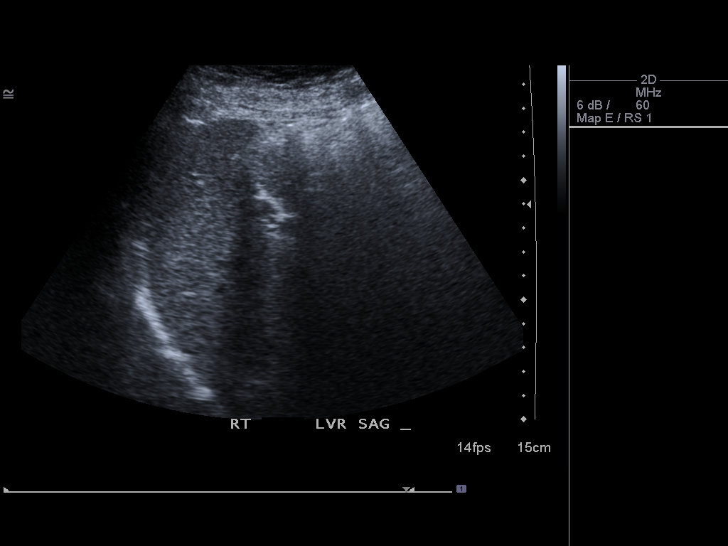
[im 20/58]
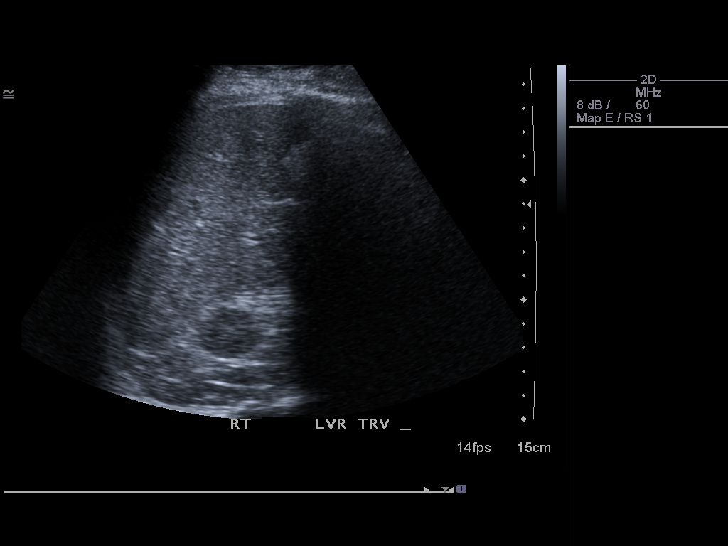
[im 22/58]
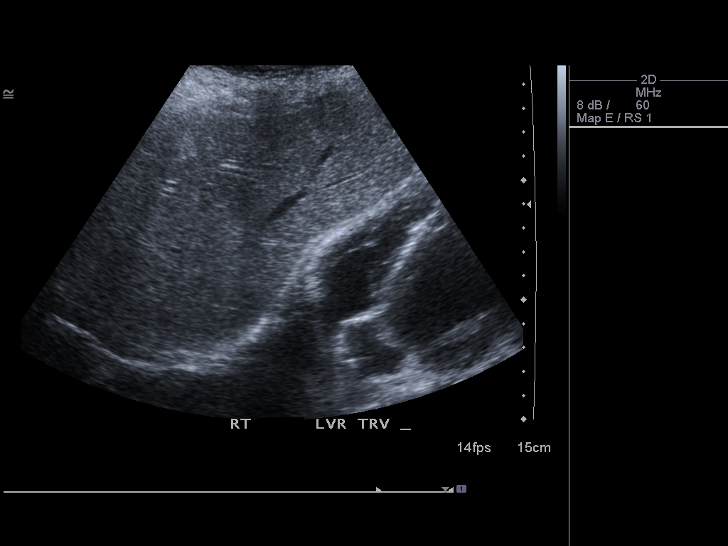
[im 27/58]
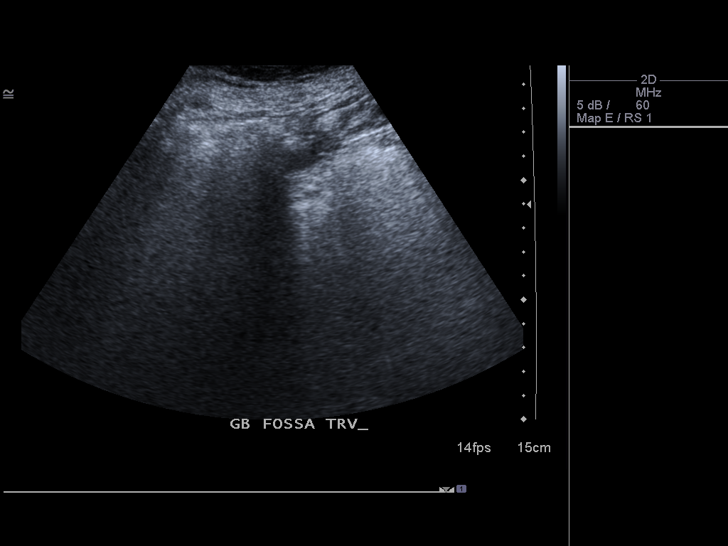
[im 31/58]
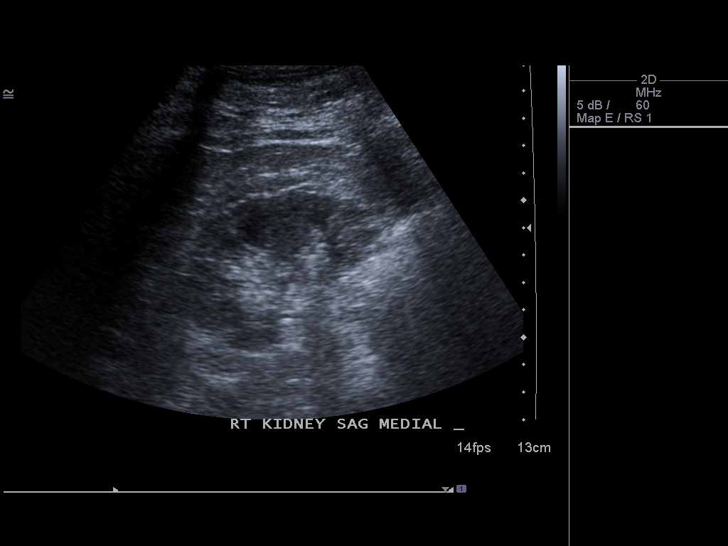
[im 36/58]
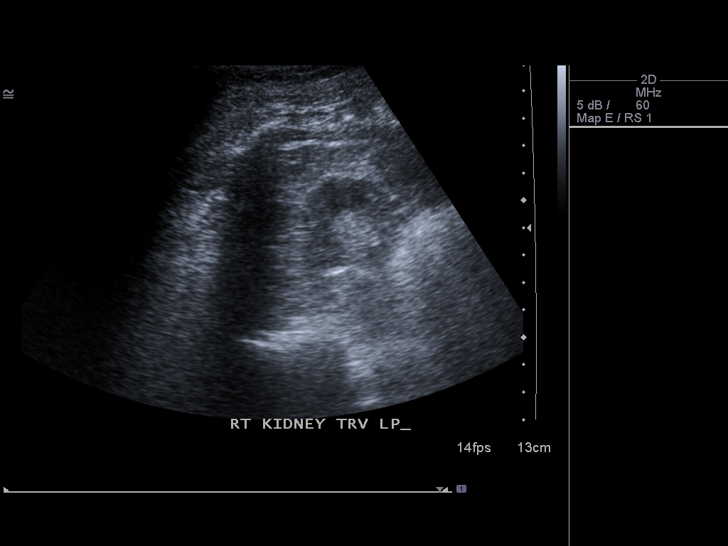
[im 39/58]
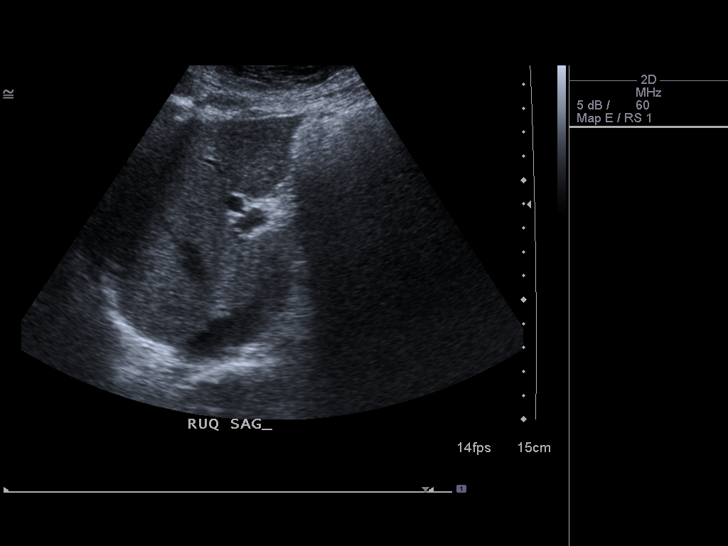
[im 43/58]
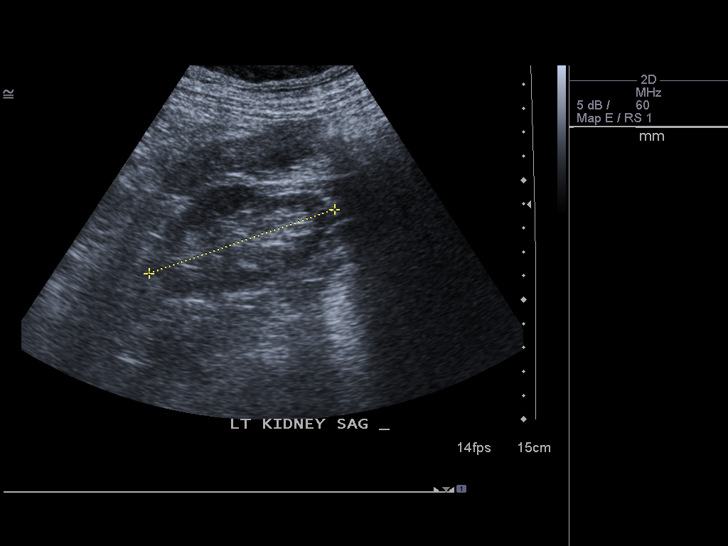
[im 48/58]
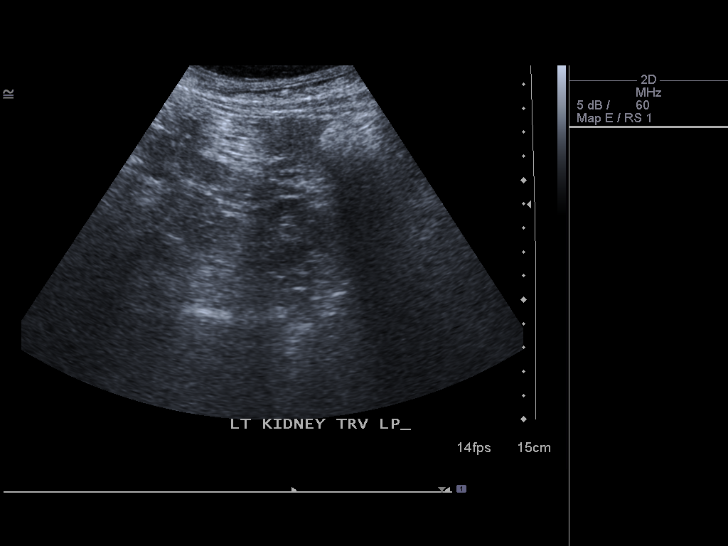
[im 53/58]
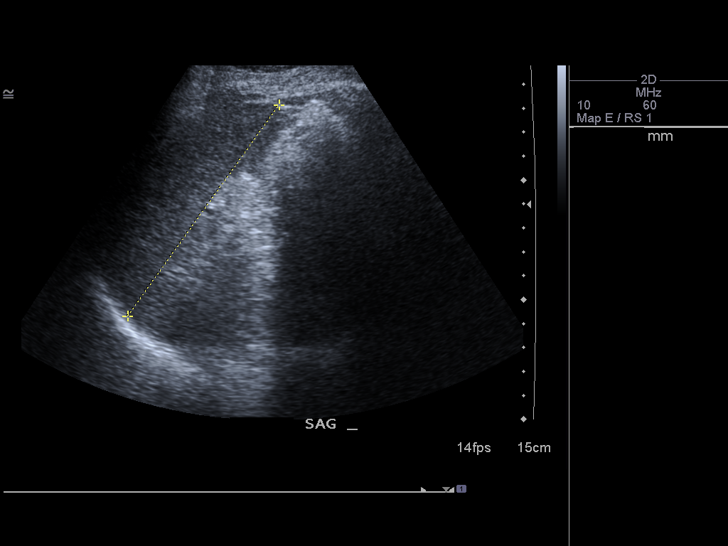
[im 58/58]
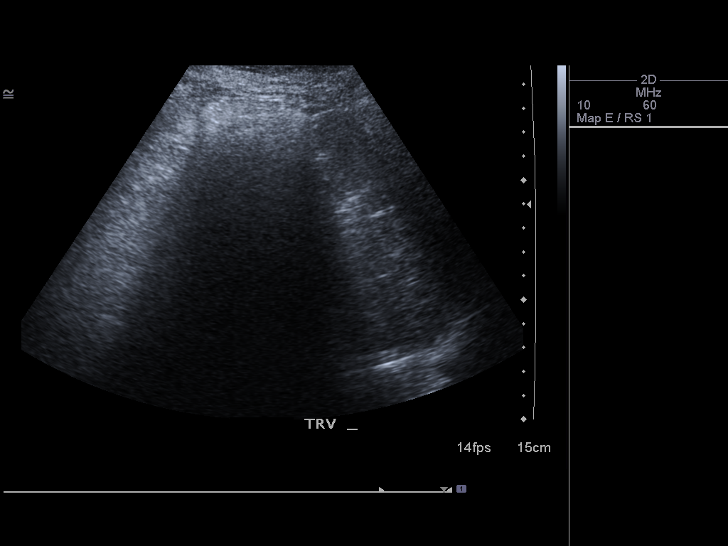

[14 of 25 positions shown; findings below may reference images not displayed]

FINDINGS: Gallbladder:    Surgically absent

Common bile duct:  Normal in caliber, 6mm diameter.

Liver:  Homogeneous in echotexture without focal lesion or
intrahepatic bile duct dilatation.

IVC:  Negative through the visualized portions

Pancreas:  Negative throughout the visualized portions

Spleen:  No focal lesion, 10.9 cm craniocaudal  length.

Right Kidney:  No mass or hydronephrosis, 8.8cm in length.

Left Kidney:  No lesion or hydronephrosis, 8.2cm in length.

Abdominal aorta:  Negative

Exam was degraded secondary to extensive overlying bowel gas.
IMPRESSION: 1.  No acute abnormality post cholecystectomy.

## 2012-12-12 IMAGING — CR DG WRIST COMPLETE 3+V*L*
4 series · 4 of 4 positions shown · non-contrast
Comparison: None.

CLINICAL DATA: Fall, left wrist and forearm pain

LEFT WRIST - COMPLETE 3+ VIEW

[x wrist pa left]
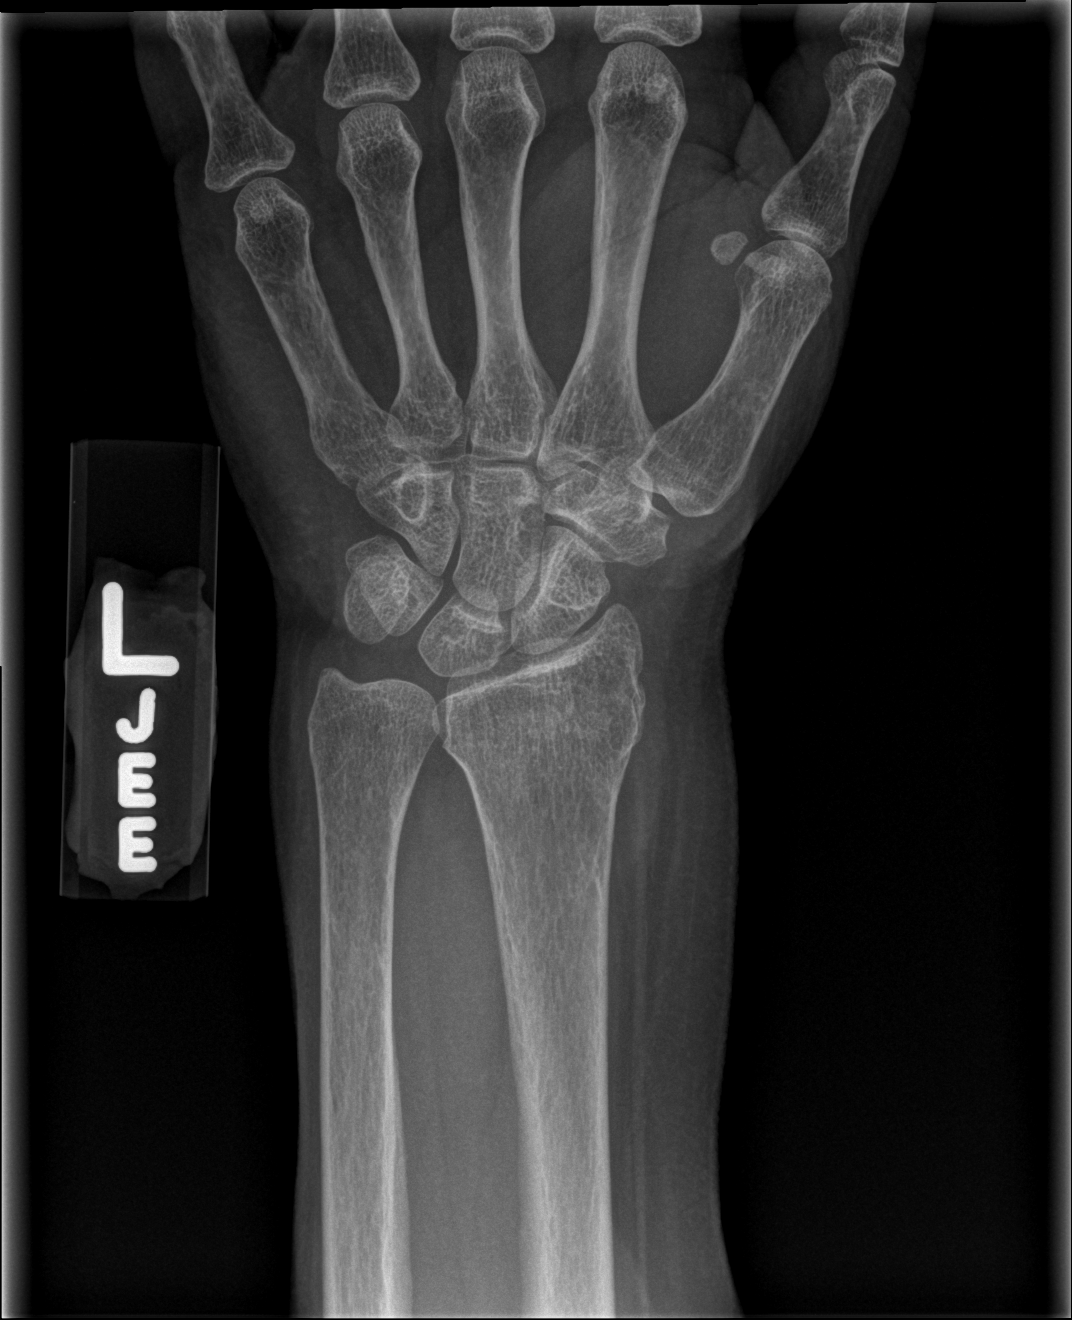

[x wrist obl left]
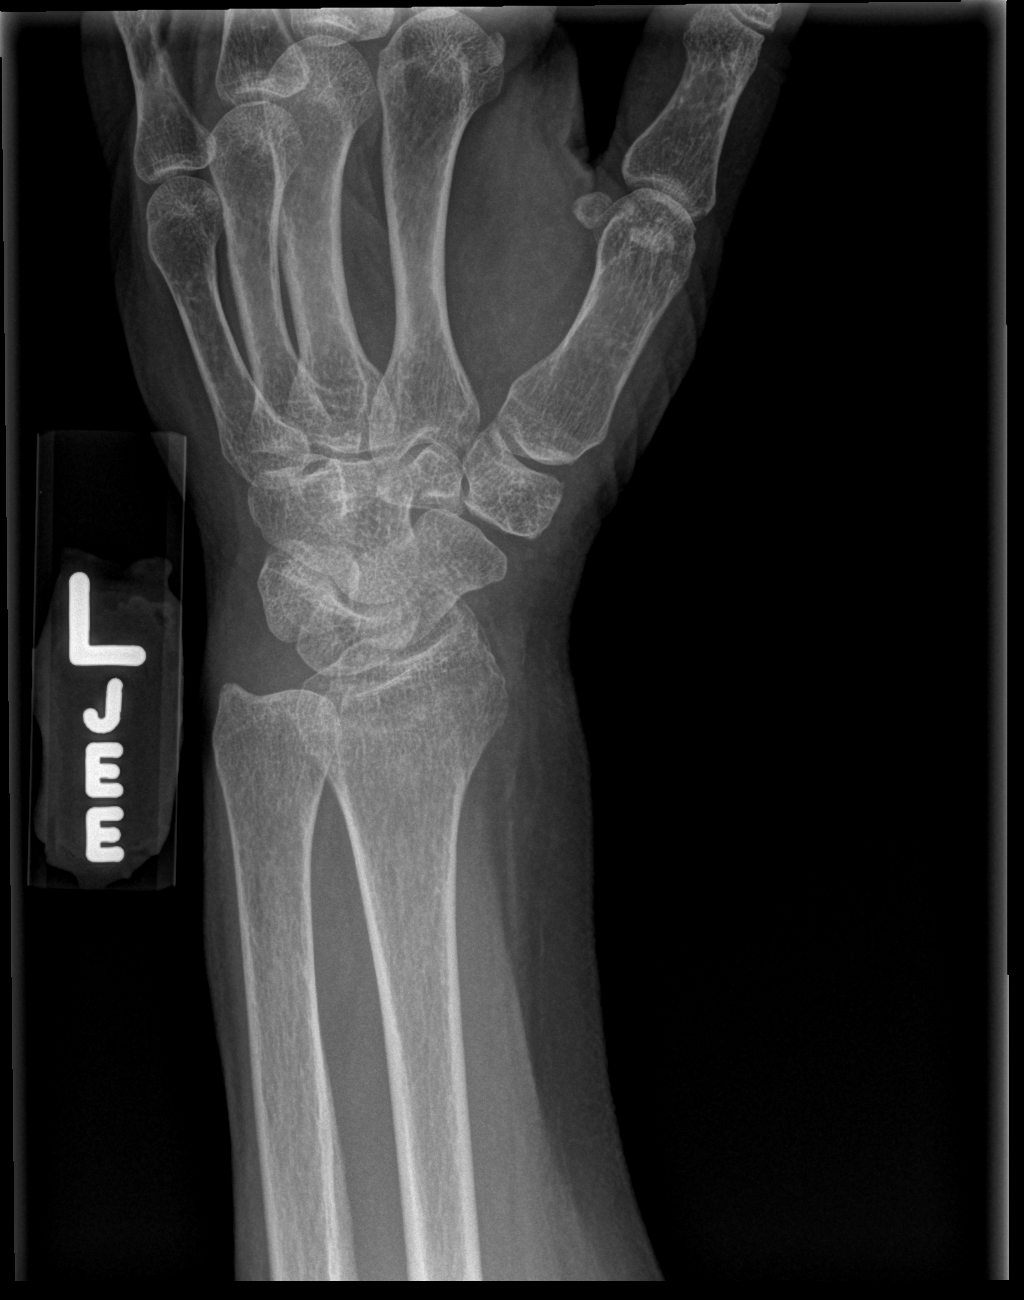

[x wrist lat left]
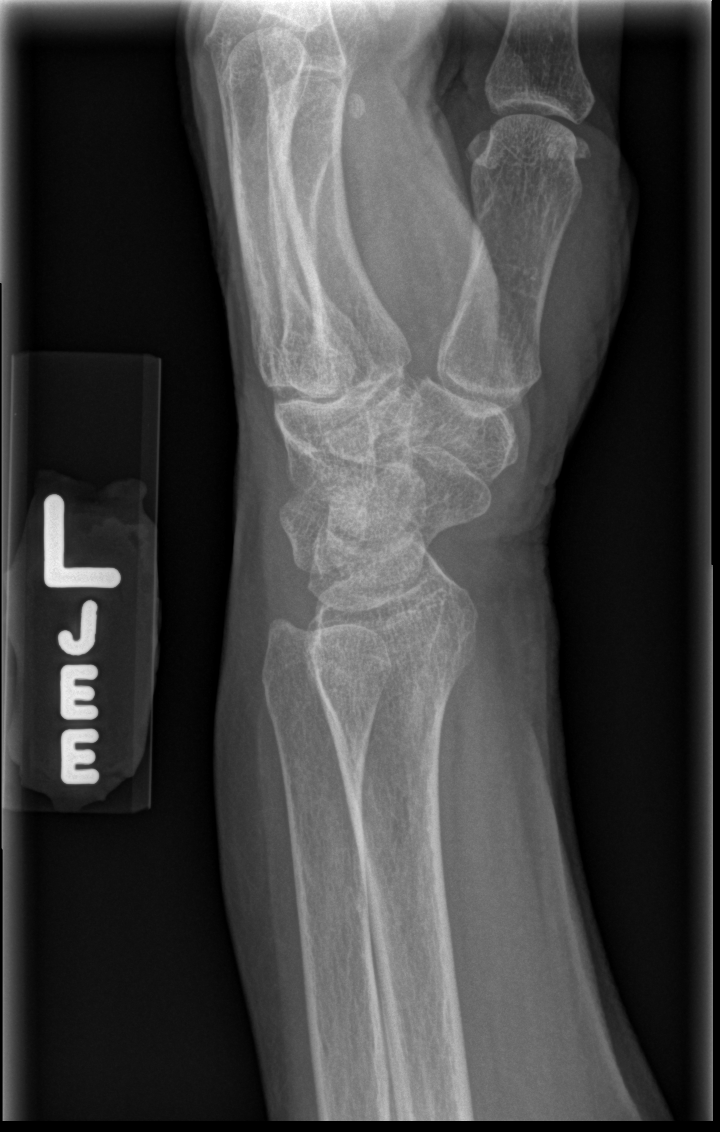

[x wrist navicular view left]
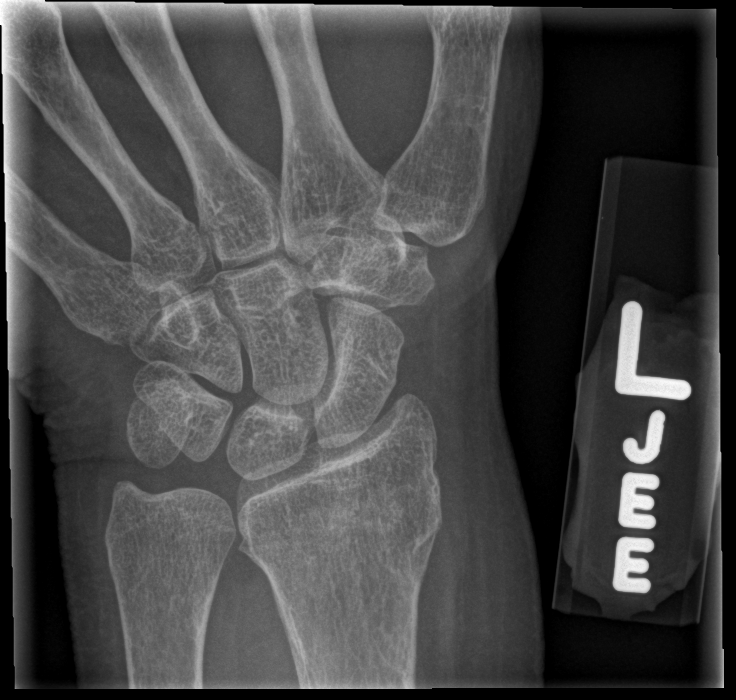

[4 of 4 positions shown; findings below may reference images not displayed]

FINDINGS: No fracture or dislocation is seen.  Please refer to
report from forearm radiographs for distal radial fracture, which
is not visualized on these images.

The joint spaces are preserved.

Mild soft tissue swelling along the radial aspect of the wrist.
IMPRESSION: No fracture or dislocation is seen.  Please refer to report from
forearm radiographs for distal radial fracture, which is not
visualized on these images.

Mild soft tissue swelling along the radial aspect of the wrist.

## 2013-01-30 ENCOUNTER — Other Ambulatory Visit: Payer: Self-pay | Admitting: *Deleted

## 2013-01-30 DIAGNOSIS — Z780 Asymptomatic menopausal state: Secondary | ICD-10-CM

## 2013-02-23 ENCOUNTER — Encounter: Payer: Self-pay | Admitting: *Deleted

## 2013-03-19 ENCOUNTER — Other Ambulatory Visit: Payer: Self-pay | Admitting: Neurology

## 2013-03-20 ENCOUNTER — Ambulatory Visit (INDEPENDENT_AMBULATORY_CARE_PROVIDER_SITE_OTHER): Payer: Medicare Other | Admitting: Family Medicine

## 2013-03-20 ENCOUNTER — Telehealth: Payer: Self-pay | Admitting: Family Medicine

## 2013-03-20 ENCOUNTER — Encounter: Payer: Self-pay | Admitting: Family Medicine

## 2013-03-20 VITALS — BP 135/86 | HR 92 | Temp 98.3°F | Ht 63.5 in | Wt 156.0 lb

## 2013-03-20 DIAGNOSIS — B86 Scabies: Secondary | ICD-10-CM

## 2013-03-20 MED ORDER — PERMETHRIN 5 % EX CREA: TOPICAL_CREAM | CUTANEOUS | Status: DC

## 2013-03-20 NOTE — Progress Notes (Signed)
  Subjective:    Patient ID: Erin Donaldson, female    DOB: Jun 29, 1951, 62 y.o.   MRN: [id number]  HPI First noticed rash on right arm and shoulder. She had just changed fabric softeners and went back to using no fabric softener. Despite re- washing the clothes without fabric softener, her a rash and itching have progressed and now from the waist up. There no new medications. She has continued to use Tide detergent and dove soap. As of today the patient's granddaughter has had scabies in the past 4-6 weeks.   Review of Systems  Constitutional: Negative.   Eyes: Negative.   Respiratory: Negative.   Cardiovascular: Negative.   Gastrointestinal: Negative.   Endocrine: Positive for heat intolerance (sun makes it worse).  Genitourinary: Negative.   Musculoskeletal: Negative.   Skin: Positive for color change (reddness) and rash.  Allergic/Immunologic: Negative.   Neurological: Positive for headaches (pt thinks this is allergy related).  Hematological: Negative.   Psychiatric/Behavioral: Negative.        Objective:   Physical Exam Sparse areas on arms back and abdomen that are pinhead in size there appear red and irritated.       Assessment & Plan:  1. Scabies - permethrin (ELIMITE) 5 % cream; Apply a first treatment and reapply from the neck down in 8-10 days  Dispense: 60 g; Refill: 0 Patient Instructions   use medication as directed Re\re wash bed linen after first application of medication Repeat medication treatment and 8-10 day

## 2013-03-20 NOTE — Telephone Encounter (Signed)
APPT MADE

## 2013-03-20 NOTE — Patient Instructions (Addendum)
use medication as directed Re\re wash bed linen after first application of medication Repeat medication treatment and 8-10 day

## 2013-03-23 ENCOUNTER — Telehealth: Payer: Self-pay | Admitting: Family Medicine

## 2013-03-23 MED ORDER — PREDNISONE 10 MG PO TABS: ORAL_TABLET | ORAL | Status: DC

## 2013-03-23 NOTE — Telephone Encounter (Signed)
Left information on patient's home voicemail.

## 2013-03-23 NOTE — Telephone Encounter (Signed)
Call and a short course of prednisone 10 mg #24 times daily for 2 days, 3 times daily for 2 days, 2 times daily for 2 days, 1 tab daily for 2 days

## 2013-04-12 ENCOUNTER — Telehealth: Payer: Self-pay | Admitting: Family Medicine

## 2013-04-12 ENCOUNTER — Ambulatory Visit (INDEPENDENT_AMBULATORY_CARE_PROVIDER_SITE_OTHER): Payer: Medicare Other | Admitting: General Practice

## 2013-04-12 ENCOUNTER — Ambulatory Visit: Payer: Self-pay

## 2013-04-12 ENCOUNTER — Other Ambulatory Visit: Payer: Self-pay

## 2013-04-12 ENCOUNTER — Encounter: Payer: Self-pay | Admitting: General Practice

## 2013-04-12 VITALS — BP 119/80 | HR 100 | Temp 97.4°F | Ht 63.5 in | Wt 164.0 lb

## 2013-04-12 DIAGNOSIS — L299 Pruritus, unspecified: Secondary | ICD-10-CM

## 2013-04-12 DIAGNOSIS — K130 Diseases of lips: Secondary | ICD-10-CM

## 2013-04-12 DIAGNOSIS — B351 Tinea unguium: Secondary | ICD-10-CM

## 2013-04-12 DIAGNOSIS — R21 Rash and other nonspecific skin eruption: Secondary | ICD-10-CM

## 2013-04-12 DIAGNOSIS — J309 Allergic rhinitis, unspecified: Secondary | ICD-10-CM

## 2013-04-12 MED ORDER — FLUTICASONE PROPIONATE 50 MCG/ACT NA SUSP: 1.0000 | Freq: Every day | NASAL | Status: DC

## 2013-04-12 MED ORDER — METHYLPREDNISOLONE ACETATE 40 MG/ML IJ SUSP
40.0000 mg | Freq: Once | INTRAMUSCULAR | Status: AC
  Administered 2013-04-12: 40 mg via INTRAMUSCULAR

## 2013-04-12 NOTE — Progress Notes (Signed)
Tolerated well

## 2013-04-12 NOTE — Progress Notes (Signed)
  Subjective:    Patient ID: Erin Donaldson, female    DOB: 1951/03/24, 62 y.o.   MRN: [id number]  HPI Presents today complaining of rash and itching. Reports she was treated for scabies 03/20/13. She reports the itching or rash hasn't resolved. She was also called in a prednisone dose pack. She reports that she used elimite cream a total of three times, without relief. Reports small pinpoint area to bilateral arms, abdomen, left leg, and back. Reports having runny nose and sneezing, especially when out walking her dogs. Reports taking OTC allergy pills, with minimal effectiveness.     Review of Systems  Constitutional: Negative for fever and chills.  HENT: Positive for rhinorrhea.        Clear nasal drainage  Respiratory: Negative for chest tightness and shortness of breath.   Cardiovascular: Negative for chest pain and palpitations.  Skin:       Bruises noted to bilateral arms, denies medications (blood thinners).   Neurological: Negative for dizziness and headaches.  Psychiatric/Behavioral: Negative.        Objective:   Physical Exam  Constitutional: She appears well-developed and well-nourished.  HENT:  Head: Normocephalic and atraumatic.  Nose: Mucosal edema and rhinorrhea present.  Mouth/Throat: Posterior oropharyngeal erythema present.  Cardiovascular: Normal rate, regular rhythm and normal heart sounds.   Pulmonary/Chest: Effort normal and breath sounds normal.  Neurological: She is alert. She has normal reflexes.  Skin: Skin is warm and dry.  Multiple bruising noted to bilateral arms.  A few Small single pin point red areas noted to bilateral arms. Negative burrows noted on body (between fingers or toes). Thick toenails, with right second toe nail loose  Psychiatric: She has a normal mood and affect.          Assessment & Plan:  1. Rash and nonspecific skin eruption - Ambulatory referral to Dermatology - methylPREDNISolone acetate (DEPO-MEDROL) injection 40 mg;  Inject 1 mL (40 mg total) into the muscle once.  2. Allergic rhinitis - fluticasone (FLONASE) 50 MCG/ACT nasal spray; Place 1 spray into the nose daily. One spray in each nostril daily.  Dispense: 16 g; Refill: 6  3. Cheilitis -keep lips clean and dry -apply lip balm  4. Toenail fungus - Ambulatory referral to Podiatry  5. Itching  - methylPREDNISolone acetate (DEPO-MEDROL) injection 40 mg; Inject 1 mL (40 mg total) into the muscle once. -RTO if symptoms worsen Patient verbalized understanding Coralie Keens, FNP-C

## 2013-04-12 NOTE — Telephone Encounter (Signed)
Pt given appt with mae 

## 2013-04-12 NOTE — Patient Instructions (Addendum)
Allergic Rhinitis Allergic rhinitis is when the mucous membranes in the nose respond to allergens. Allergens are particles in the air that cause your body to have an allergic reaction. This causes you to release allergic antibodies. Through a chain of events, these eventually cause you to release histamine into the blood stream (hence the use of antihistamines). Although meant to be protective to the body, it is this release that causes your discomfort, such as frequent sneezing, congestion and an itchy runny nose.  CAUSES  The pollen allergens may come from grasses, trees, and weeds. This is seasonal allergic rhinitis, or "hay fever." Other allergens cause year-round allergic rhinitis (perennial allergic rhinitis) such as house dust mite allergen, pet dander and mold spores.  SYMPTOMS   Nasal stuffiness (congestion).  Runny, itchy nose with sneezing and tearing of the eyes.  There is often an itching of the mouth, eyes and ears. It cannot be cured, but it can be controlled with medications. DIAGNOSIS  If you are unable to determine the offending allergen, skin or blood testing may find it. TREATMENT   Avoid the allergen.  Medications and allergy shots (immunotherapy) can help.  Hay fever may often be treated with antihistamines in pill or nasal spray forms. Antihistamines block the effects of histamine. There are over-the-counter medicines that may help with nasal congestion and swelling around the eyes. Check with your caregiver before taking or giving this medicine. If the treatment above does not work, there are many new medications your caregiver can prescribe. Stronger medications may be used if initial measures are ineffective. Desensitizing injections can be used if medications and avoidance fails. Desensitization is when a patient is given ongoing shots until the body becomes less sensitive to the allergen. Make sure you follow up with your caregiver if problems continue. SEEK MEDICAL  CARE IF:   You develop fever (more than 100.5 F (38.1 C).  You develop a cough that does not stop easily (persistent).  You have shortness of breath.  You start wheezing.  Symptoms interfere with normal daily activities. Document Released: 07/28/2001 Document Revised: 01/25/2012 Document Reviewed: 02/06/2009 [employer] Patient Information 2014 [employer].  

## 2013-04-14 ENCOUNTER — Telehealth: Payer: Self-pay | Admitting: Family Medicine

## 2013-04-14 NOTE — Telephone Encounter (Signed)
Appt given

## 2013-04-17 ENCOUNTER — Ambulatory Visit (INDEPENDENT_AMBULATORY_CARE_PROVIDER_SITE_OTHER): Payer: Medicare Other | Admitting: Family Medicine

## 2013-04-17 ENCOUNTER — Encounter: Payer: Self-pay | Admitting: Family Medicine

## 2013-04-17 VITALS — BP 150/69 | HR 92 | Temp 97.5°F | Ht 63.75 in | Wt 165.6 lb

## 2013-04-17 DIAGNOSIS — R5381 Other malaise: Secondary | ICD-10-CM

## 2013-04-17 DIAGNOSIS — I10 Essential (primary) hypertension: Secondary | ICD-10-CM

## 2013-04-17 DIAGNOSIS — R5383 Other fatigue: Secondary | ICD-10-CM

## 2013-04-17 DIAGNOSIS — F319 Bipolar disorder, unspecified: Secondary | ICD-10-CM

## 2013-04-17 DIAGNOSIS — E119 Type 2 diabetes mellitus without complications: Secondary | ICD-10-CM

## 2013-04-17 DIAGNOSIS — E559 Vitamin D deficiency, unspecified: Secondary | ICD-10-CM

## 2013-04-17 DIAGNOSIS — E785 Hyperlipidemia, unspecified: Secondary | ICD-10-CM

## 2013-04-17 LAB — POCT CBC
Granulocyte percent: 79.2 %G (ref 37–80)
HCT, POC: 36.8 % — AB (ref 37.7–47.9)
Hemoglobin: 12.5 g/dL (ref 12.2–16.2)
Lymph, poc: 1.1 (ref 0.6–3.4)
MCH, POC: 28.4 pg (ref 27–31.2)
MCHC: 34 g/dL (ref 31.8–35.4)
MCV: 83.6 fL (ref 80–97)
MPV: 7.6 fL (ref 0–99.8)
POC Granulocyte: 5.2 (ref 2–6.9)
POC LYMPH PERCENT: 16.4 %L (ref 10–50)
Platelet Count, POC: 322 10*3/uL (ref 142–424)
RBC: 4.4 M/uL (ref 4.04–5.48)
RDW, POC: 12.6 %
WBC: 6.6 10*3/uL (ref 4.6–10.2)

## 2013-04-17 LAB — BASIC METABOLIC PANEL WITH GFR
BUN: 9 mg/dL (ref 6–23)
CO2: 29 mEq/L (ref 19–32)
Calcium: 9.5 mg/dL (ref 8.4–10.5)
Chloride: 98 mEq/L (ref 96–112)
Creat: 0.93 mg/dL (ref 0.50–1.10)
GFR, Est African American: 78 mL/min
GFR, Est Non African American: 67 mL/min
Glucose, Bld: 118 mg/dL — ABNORMAL HIGH (ref 70–99)
Potassium: 4.8 mEq/L (ref 3.5–5.3)
Sodium: 138 mEq/L (ref 135–145)

## 2013-04-17 LAB — POCT UA - MICROALBUMIN: Microalbumin Ur, POC: NEGATIVE mg/dL

## 2013-04-17 LAB — THYROID PANEL WITH TSH
Free Thyroxine Index: 1.4 (ref 1.0–3.9)
T3 Uptake: 33.3 % (ref 22.5–37.0)
T4, Total: 4.3 ug/dL — ABNORMAL LOW (ref 5.0–12.5)
TSH: 0.805 u[IU]/mL (ref 0.350–4.500)

## 2013-04-17 LAB — HEPATIC FUNCTION PANEL
ALT: 11 U/L (ref 0–35)
AST: 15 U/L (ref 0–37)
Albumin: 4.2 g/dL (ref 3.5–5.2)
Alkaline Phosphatase: 158 U/L — ABNORMAL HIGH (ref 39–117)
Bilirubin, Direct: 0.1 mg/dL (ref 0.0–0.3)
Indirect Bilirubin: 0.3 mg/dL (ref 0.0–0.9)
Total Bilirubin: 0.4 mg/dL (ref 0.3–1.2)
Total Protein: 6.9 g/dL (ref 6.0–8.3)

## 2013-04-17 LAB — POCT GLYCOSYLATED HEMOGLOBIN (HGB A1C): Hemoglobin A1C: 5.7

## 2013-04-17 NOTE — Progress Notes (Signed)
Subjective:    Patient ID: Erin Donaldson, female    DOB: Jul 03, 1951, 62 y.o.   MRN: [id number]  HPI This patient presents for recheck of multiple medical problems. No one accompanies the patient today.  Patient Active Problem List   Diagnosis Date Noted  . BIPOLAR AFFECTIVE DISORDER 08/26/2009  . ANXIETY 08/26/2009  . MIGRAINE HEADACHE 08/26/2009  . DIABETES MELLITUS, TYPE II 08/15/2009  . HYPERLIPIDEMIA 08/15/2009  . HYPERTENSION 08/15/2009  . GERD 08/15/2009  . CONSTIPATION 08/15/2009  . IRRITABLE BOWEL SYNDROME 08/15/2009    In addition, she is having issues with allergic rhinitis, still dealing with the loss of her husband, and subsequent insomnia.. She is still seeing Dr. Jennelle Human. She is using Flonase prescription for her allergies.  The allergies, current medications, past medical history, surgical history, family and social history are reviewed.  Immunizations reviewed.  Health maintenance reviewed.  The following items are outstanding: Mammogram, colonoscopy, and tetanus.      Review of Systems  Constitutional: Positive for activity change and appetite change (due to loss of husband).  HENT: Positive for sneezing (some) and postnasal drip (due to allergies). Negative for ear pain.   Eyes: Negative.   Respiratory: Negative.   Cardiovascular: Negative.   Gastrointestinal: Positive for constipation. Negative for abdominal pain.  Genitourinary: Negative.   Musculoskeletal: Positive for back pain (upper) and arthralgias (L shoulder).  Skin: Positive for rash (lower legs).  Allergic/Immunologic: Positive for environmental allergies (seasonal).  Neurological: Positive for tremors (bilateral arms) and headaches (2-3 x per week). Negative for dizziness.  Psychiatric/Behavioral: Positive for sleep disturbance (nightly). Negative for suicidal ideas. The patient is nervous/anxious.    She has not checked any blood sugars at home.    Objective:   Physical Exam BP 150/69   Pulse 92  Temp(Src) 97.5 F (36.4 C) (Oral)  Ht 5' 3.75" (1.619 m)  Wt 165 lb 9.6 oz (75.116 kg)  BMI 28.66 kg/m2  The patient appeared well nourished and normally developed, alert and oriented to time and place. No issues with memory. Speech, behavior and judgement appear normal. She is still somewhat depressed due to the loss of her husband. Vital signs as documented.  Head exam is unremarkable. No scleral icterus or pallor noted.  Neck is without jugular venous distension, thyromegally, or carotid bruits. Carotid upstrokes are brisk bilaterally. No cervical adenopathy. Lungs are clear anteriorly and posteriorly to auscultation. Normal respiratory effort. Cardiac exam reveals regular rate and rhythm at 84 per minute.. First and second heart sounds normal.  No murmurs, rubs or gallops.  Abdominal exam reveals normal bowl sounds, no masses, no organomegaly and no aortic enlargement. No inguinal adenopathy. She still has some obesity. Extremities are nonedematous and both femoral and pedal pulses are normal. Skin without pallor or jaundice.  Warm and dry, without rash. She does have a lot of bruises on her arms which have been typical for her. She has this punctated rash on her lower extremities which is pruritic. Neurologic exam reveals normal deep tendon reflexes and normal sensation. Her affect is somewhat depressed.          Assessment & Plan:  1. HYPERLIPIDEMIA - NMR Lipoprofile with Lipids; Standing - Hepatic function panel; Standing  2. HYPERTENSION - BASIC METABOLIC PANEL WITH GFR; Standing  3. DIABETES MELLITUS, TYPE II - BASIC METABOLIC PANEL WITH GFR; Standing - POCT glycosylated hemoglobin (Hb A1C); Standing - POCT UA - Microalbumin; Standing  4. BIPOLAR AFFECTIVE DISORDER -Continue to follow with Dr. Jennelle Human.  5. Fatigue - POCT CBC; Standing - Thyroid Panel With TSH; Standing - Vitamin D 25 hydroxy; Standing  6. Vitamin D deficiency - Vitamin D 25 hydroxy;  Standing   Patient Instructions  Fall prevention. Be careful not to climb and put yourself at risk for falling. Continue current medications and aggressive therapeutic lifestyle changes. Stay active. Stay involved in outside activities. Try Zyrtec again over-the-counter one nightly Remember detergents, fabric softeners, soaps. Use scent-free fabric softener

## 2013-04-17 NOTE — Patient Instructions (Addendum)
Fall prevention. Be careful not to climb and put yourself at risk for falling. Continue current medications and aggressive therapeutic lifestyle changes. Stay active. Stay involved in outside activities. Try Zyrtec again over-the-counter one nightly Remember detergents, fabric softeners, soaps. Use scent-free fabric softener

## 2013-04-17 NOTE — Addendum Note (Signed)
Addended by: Orma Render F on: 04/17/2013 11:32 AM   Modules accepted: Orders

## 2013-04-18 LAB — NMR LIPOPROFILE WITH LIPIDS
Cholesterol, Total: 195 mg/dL (ref <200)
HDL Particle Number: 44.5 umol/L (ref >=30.5)
HDL Size: 10 nm (ref >=9.2)
HDL-C: 88 mg/dL (ref >=40)
LDL (calc): 95 mg/dL (ref <100)
LDL Particle Number: 909 nmol/L (ref <1000)
LDL Size: 21.5 nm (ref >20.5)
LP-IR Score: <25 (ref <=45)
Large HDL-P: 13.8 umol/L (ref >=4.8)
Large VLDL-P: <0.8 nmol/L (ref <=2.7)
Small LDL Particle Number: <90 nmol/L (ref <=527)
Triglycerides: 62 mg/dL (ref <150)
VLDL Size: 43.3 nm (ref <=46.6)

## 2013-04-18 LAB — VITAMIN D 25 HYDROXY (VIT D DEFICIENCY, FRACTURES): Vit D, 25-Hydroxy: 106 ng/mL — ABNORMAL HIGH (ref 30–89)

## 2013-04-20 ENCOUNTER — Telehealth: Payer: Self-pay | Admitting: *Deleted

## 2013-04-20 NOTE — Telephone Encounter (Signed)
Message copied by Bearl Mulberry on Thu Apr 20, 2013  6:14 PM ------      Message from: Ernestina Penna      Created: Tue Apr 18, 2013  8:27 PM       Electrolytes were normal. Blood sugar was elevated at 118. Kidney function test was normal.      Thyroid tests were normal.      Vitamin D level was actually too high. Confirm current vitamin D treatment regimen----------- hold vitamin D treatment for one week and restart at a lower level. Ie, reduce by at least 1000 daily      Advanced lipid testing revealed a total LDL particle number to be at goal of less than 1000 at 909. Triglycerides were good. HDL P. was excellent      One liver function test was elevated all others were good, recheck liver function test nonfasting in 6 weeks ------

## 2013-04-20 NOTE — Telephone Encounter (Signed)
Pt notified of results

## 2013-04-24 ENCOUNTER — Telehealth: Payer: Self-pay | Admitting: Family Medicine

## 2013-04-24 NOTE — Telephone Encounter (Signed)
Pt notified of lab results Not currently taking any Vit D She is having symptoms of sinus pressure, headache, green drainage and would like RX for a sinus infection Please call to advise

## 2013-04-24 NOTE — Telephone Encounter (Signed)
Message copied by Bearl Mulberry on Mon Apr 24, 2013  7:11 PM ------      Message from: Ernestina Penna      Created: Tue Apr 18, 2013  8:27 PM       Electrolytes were normal. Blood sugar was elevated at 118. Kidney function test was normal.      Thyroid tests were normal.      Vitamin D level was actually too high. Confirm current vitamin D treatment regimen----------- hold vitamin D treatment for one week and restart at a lower level. Ie, reduce by at least 1000 daily      Advanced lipid testing revealed a total LDL particle number to be at goal of less than 1000 at 909. Triglycerides were good. HDL P. was excellent      One liver function test was elevated all others were good, recheck liver function test nonfasting in 6 weeks ------

## 2013-04-24 NOTE — Telephone Encounter (Signed)
LMOM

## 2013-04-25 NOTE — Telephone Encounter (Signed)
Z-Pak take as directed

## 2013-04-28 ENCOUNTER — Telehealth: Payer: Self-pay | Admitting: Family Medicine

## 2013-05-02 MED ORDER — OMEPRAZOLE 40 MG PO CPDR: 80.0000 mg | DELAYED_RELEASE_CAPSULE | Freq: Every day | ORAL | Status: DC

## 2013-05-02 NOTE — Telephone Encounter (Signed)
done

## 2013-05-03 ENCOUNTER — Other Ambulatory Visit: Payer: Self-pay | Admitting: Nurse Practitioner

## 2013-05-10 ENCOUNTER — Other Ambulatory Visit: Payer: Self-pay

## 2013-05-10 ENCOUNTER — Ambulatory Visit: Payer: Self-pay

## 2013-06-16 ENCOUNTER — Other Ambulatory Visit: Payer: Self-pay

## 2013-06-16 MED ORDER — DONEPEZIL HCL 10 MG PO TABS: 10.0000 mg | ORAL_TABLET | Freq: Every day | ORAL | Status: DC

## 2013-07-07 ENCOUNTER — Ambulatory Visit: Payer: Medicare Other | Admitting: Nurse Practitioner

## 2013-07-18 ENCOUNTER — Telehealth: Payer: Self-pay | Admitting: Family Medicine

## 2013-07-18 NOTE — Telephone Encounter (Signed)
Hypotension x 3 weeks.  She is not antihypertensives. Diarrhea from 8/29 until 9/1.  She took Immodium and is drinking Sprite. Continues to have weakness.  Asked her to continue increased fluid intake and to include water.  Eat a bland diet. Offered appt today but patient is unable to make it today. Appt schedule for tomorrow.  Instructed her to call 911 if necessary.   Patient stated understanding and agreement to plan.

## 2013-07-19 ENCOUNTER — Encounter: Payer: Self-pay | Admitting: Family Medicine

## 2013-07-19 ENCOUNTER — Ambulatory Visit (INDEPENDENT_AMBULATORY_CARE_PROVIDER_SITE_OTHER): Payer: Medicare Other | Admitting: Family Medicine

## 2013-07-19 VITALS — BP 96/50 | HR 100 | Temp 98.1°F | Ht 67.35 in | Wt 167.0 lb

## 2013-07-19 DIAGNOSIS — R197 Diarrhea, unspecified: Secondary | ICD-10-CM | POA: Insufficient documentation

## 2013-07-19 LAB — POCT CBC
Granulocyte percent: 69.2 %G (ref 37–80)
HCT, POC: 33.6 % — AB (ref 37.7–47.9)
Hemoglobin: 11.1 g/dL — AB (ref 12.2–16.2)
Lymph, poc: 1.7 (ref 0.6–3.4)
MCH, POC: 24.8 pg — AB (ref 27–31.2)
MCHC: 32.9 g/dL (ref 31.8–35.4)
MCV: 75.5 fL — AB (ref 80–97)
MPV: 6.3 fL (ref 0–99.8)
POC Granulocyte: 6.2 (ref 2–6.9)
POC LYMPH PERCENT: 19.3 %L (ref 10–50)
Platelet Count, POC: 345 10*3/uL (ref 142–424)
RBC: 4.5 M/uL (ref 4.04–5.48)
RDW, POC: 13.2 %
WBC: 8.9 10*3/uL (ref 4.6–10.2)

## 2013-07-19 MED ORDER — CIPROFLOXACIN HCL 500 MG PO TABS: 500.0000 mg | ORAL_TABLET | Freq: Two times a day (BID) | ORAL | Status: DC

## 2013-07-19 NOTE — Progress Notes (Signed)
Patient ID: Erin Donaldson, female   DOB: 01-Jul-1951, 62 y.o.   MRN: [id number] SUBJECTIVE: CC: Chief Complaint  Patient presents with  . Diarrhea    HPI: Usually has constipation. Started 5 nights ago.as diarrhea, has some  Sunday.and the last 2 days persisting. Had 3-4 chicken livers and taco.and some baked potato. Had a little fever low grade. No blood in the stools. Using Imodium. Up to today. Used 1 dose today and had 2 diarrheal stools today. No mucus. No h/o colitis.  Past Medical History  Diagnosis Date  . Migraine   . Bipolar affective   . Anxiety   . Hyperlipidemia   . Hypertension   . GERD (gastroesophageal reflux disease)   . Diabetes in pregnancy   . Diabetes mellitus   . IBS (irritable bowel syndrome)   . Anemia   . Elevated LFTs   . CVA (cerebral infarction)   . Depression    Past Surgical History  Procedure Laterality Date  . Cholecystectomy    . Gastric bypass  2005   History   Social History  . Marital Status: Widowed    Spouse Name: [name]    Number of Children: [name]  . Years of Education: [name]   Occupational History  . Not on file.   Social History Main Topics  . Smoking status: Never Smoker   . Smokeless tobacco: Not on file  . Alcohol Use: No     Comment: rare  . Drug Use: No  . Sexual Activity: Not on file   Other Topics Concern  . Not on file   Social History Narrative  . No narrative on file   Family History  Problem Relation Age of Onset  . Kidney disease Father   . Heart disease Father   . Heart disease Mother   . Heart disease Maternal Grandfather   . Heart disease Brother   . Heart disease Maternal Aunt   . Heart disease Maternal Uncle   . Heart disease Paternal Aunt   . Heart disease Paternal Uncle   . Breast cancer Mother   . Colon cancer Neg Hx   . Diabetes Father   . Diabetes Maternal Aunt   . Diabetes Maternal Uncle   . Diabetes Paternal Aunt   . Diabetes Paternal Uncle   . Cancer Mother     bladder    Current Outpatient Prescriptions on File Prior to Visit  Medication Sig Dispense Refill  . donepezil (ARICEPT) 10 MG tablet Take 1 tablet (10 mg total) by mouth daily.  30 tablet  0  . fluticasone (FLONASE) 50 MCG/ACT nasal spray Place 1 spray into the nose daily. One spray in each nostril daily.  16 g  6  . gabapentin (NEURONTIN) 600 MG tablet Take 600 mg by mouth 3 (three) times daily.      Marland Kitchen lamoTRIgine (LAMICTAL) 200 MG tablet Take 200 mg by mouth daily.      Marland Kitchen omeprazole (PRILOSEC) 40 MG capsule TAKE 1 CAPSULE BY MOUTH ONCE DAILY  30 capsule  3  . QUEtiapine (SEROQUEL) 200 MG tablet Take 200 mg by mouth daily.      . QUEtiapine (SEROQUEL) 400 MG tablet Take 400 mg by mouth at bedtime.      . rizatriptan (MAXALT) 10 MG tablet Take 10 mg by mouth as needed. May repeat in 2 hours if needed      . torsemide (DEMADEX) 20 MG tablet TAKE 1 TABLET BY MOUTH ONCE DAILY  30 tablet  2   No current facility-administered medications on file prior to visit.   Allergies  Allergen Reactions  . Amoxicillin   . Darvocet [Propoxyphene-Acetaminophen]   . Decongestant [Oxymetazoline]   . Elavil [Amitriptyline]   . Risperidone And Related   . Ultram [Tramadol]     There is no immunization history on file for this patient. Prior to Admission medications   Medication Sig Start Date End Date Taking? Authorizing Provider  donepezil (ARICEPT) 10 MG tablet Take 1 tablet (10 mg total) by mouth daily. 06/16/13  Yes Carmen Dohmeier, MD  fluticasone (FLONASE) 50 MCG/ACT nasal spray Place 1 spray into the nose daily. One spray in each nostril daily. 04/12/13  Yes Mae Shelda Altes, FNP  gabapentin (NEURONTIN) 600 MG tablet Take 600 mg by mouth 3 (three) times daily.   Yes Historical Provider, MD  lamoTRIgine (LAMICTAL) 200 MG tablet Take 200 mg by mouth daily.   Yes Historical Provider, MD  omeprazole (PRILOSEC) 40 MG capsule TAKE 1 CAPSULE BY MOUTH ONCE DAILY 05/03/13  Yes Ernestina Penna, MD  QUEtiapine  (SEROQUEL) 200 MG tablet Take 200 mg by mouth daily.   Yes Historical Provider, MD  QUEtiapine (SEROQUEL) 400 MG tablet Take 400 mg by mouth at bedtime.   Yes Historical Provider, MD  rizatriptan (MAXALT) 10 MG tablet Take 10 mg by mouth as needed. May repeat in 2 hours if needed   Yes Historical Provider, MD  torsemide (DEMADEX) 20 MG tablet TAKE 1 TABLET BY MOUTH ONCE DAILY 05/03/13  Yes Ernestina Penna, MD     ROS: As above in the HPI. All other systems are stable or negative.  OBJECTIVE: APPEARANCE:  Patient in no acute distress.The patient appeared well nourished and normally developed. Acyanotic. Waist: VITAL SIGNS:BP 96/50  Pulse 100  Temp(Src) 98.1 F (36.7 C) (Oral)  Ht 5' 7.35" (1.711 m)  Wt 167 lb (75.751 kg)  BMI 25.88 kg/m2  WF SKIN: warm and  Dry without overt rashes, tattoos and scars  HEAD and Neck: without JVD, Head and scalp: normal Eyes:No scleral icterus. Fundi normal, eye movements normal. Ears: Auricle normal, canal normal, Tympanic membranes normal, insufflation normal. Nose: normal Throat: normal Neck & thyroid: normal  CHEST & LUNGS: Chest wall: normal Lungs: Clear  CVS: Reveals the PMI to be normally located. Regular rhythm, First and Second Heart sounds are normal,  absence of murmurs, rubs or gallops. Peripheral vasculature: Radial pulses: normal Dorsal pedis pulses: normal Posterior pulses: normal  ABDOMEN:  Appearance: normal Benign, no organomegaly, no masses, no Abdominal Aortic enlargement. No Guarding , no rebound. No Bruits. Bowel sounds: normal  RECTAL: [name] GU: [name]  EXTREMETIES: nonedematous.  MUSCULOSKELETAL:  Spine: normal Joints: intact  NEUROLOGIC: oriented to time,place and person; nonfocal. Strength is normal Sensory is normal Reflexes are normal Cranial Nerves are normal.  ASSESSMENT: Diarrhea - Plan: POCT CBC, Stool culture, Clostridium difficile EIA, Ova and parasite examination, ciprofloxacin (CIPRO) 500  MG tablet   PLAN: Orders Placed This Encounter  Procedures  . Stool culture  . Clostridium difficile EIA  . Ova and parasite examination  . POCT CBC    Meds ordered this encounter  Medications  . ciprofloxacin (CIPRO) 500 MG tablet    Sig: Take 1 tablet (500 mg total) by mouth 2 (two) times daily.    Dispense:  6 tablet    Refill:  0    BRAT diet and  Advance to regular bland diet.  Results for orders placed in visit  on 07/19/13  POCT CBC      Result Value Range   WBC 8.9  4.6 - 10.2 K/uL   Lymph, poc 1.7  0.6 - 3.4   POC LYMPH PERCENT 19.3  10 - 50 %L   POC Granulocyte 6.2  2 - 6.9   Granulocyte percent 69.2  37 - 80 %G   RBC 4.5  4.04 - 5.48 M/uL   Hemoglobin 11.1 (*) 12.2 - 16.2 g/dL   HCT, POC 16.1 (*) 09.6 - 47.9 %   MCV 75.5 (*) 80 - 97 fL   MCH, POC 24.8 (*) 27 - 31.2 pg   MCHC 32.9  31.8 - 35.4 g/dL   RDW, POC 04.5     Platelet Count, POC 345.0  142 - 424 K/uL   MPV 6.3  0 - 99.8 fL   Return if symptoms worsen or fail to improve.  Korbin Notaro P. [place] Charon, M.D.

## 2013-07-22 ENCOUNTER — Other Ambulatory Visit: Payer: Self-pay | Admitting: Family Medicine

## 2013-07-26 ENCOUNTER — Other Ambulatory Visit: Payer: Self-pay

## 2013-07-26 MED ORDER — DONEPEZIL HCL 10 MG PO TABS: 10.0000 mg | ORAL_TABLET | Freq: Every day | ORAL | Status: DC

## 2013-08-08 ENCOUNTER — Other Ambulatory Visit: Payer: Self-pay

## 2013-08-08 MED ORDER — DONEPEZIL HCL 10 MG PO TABS: 10.0000 mg | ORAL_TABLET | Freq: Every day | ORAL | Status: DC

## 2013-08-21 ENCOUNTER — Ambulatory Visit: Payer: Medicare Other | Admitting: Family Medicine

## 2013-08-23 ENCOUNTER — Encounter: Payer: Self-pay | Admitting: Neurology

## 2013-08-23 ENCOUNTER — Ambulatory Visit (INDEPENDENT_AMBULATORY_CARE_PROVIDER_SITE_OTHER): Payer: Medicare Other | Admitting: Neurology

## 2013-08-23 VITALS — BP 117/71 | HR 92 | Resp 16 | Ht 66.0 in | Wt 166.0 lb

## 2013-08-23 DIAGNOSIS — G2401 Drug induced subacute dyskinesia: Secondary | ICD-10-CM

## 2013-08-23 DIAGNOSIS — G43909 Migraine, unspecified, not intractable, without status migrainosus: Secondary | ICD-10-CM

## 2013-08-23 DIAGNOSIS — R279 Unspecified lack of coordination: Secondary | ICD-10-CM

## 2013-08-23 DIAGNOSIS — F04 Amnestic disorder due to known physiological condition: Secondary | ICD-10-CM | POA: Insufficient documentation

## 2013-08-23 MED ORDER — PROPRANOLOL HCL ER 60 MG PO CP24: 60.0000 mg | ORAL_CAPSULE | Freq: Every day | ORAL | Status: DC

## 2013-08-23 MED ORDER — DONEPEZIL HCL 10 MG PO TABS: 10.0000 mg | ORAL_TABLET | Freq: Every day | ORAL | Status: DC

## 2013-08-23 MED ORDER — RIZATRIPTAN BENZOATE 10 MG PO TABS: 10.0000 mg | ORAL_TABLET | ORAL | Status: DC | PRN

## 2013-08-23 NOTE — Patient Instructions (Signed)
Affective Disorder Affective disorder is a condition with a combination of features of  a mood disorder. There are symptoms of a psychotic disorder such as distorted thinking, hallucinations or delusions (schizophrenia feature) and depression or mania (mood disorder feature). There are 2 subtypes based on the mood component:   Bipolar Type.  Depressive Type. CAUSES  Schizoaffective disorder, like schizophrenia, appears to have distinct genetic links. It is unknown as to what exactly causes the disorder. Some experts believe it involves brain chemistry such as an imbalance of serotonin and dopamine in the brain. These naturally occurring chemicals help relay electronic signals in the brain and help regulate mood. Other experts have speculated whether fetal exposure to toxins or viral illness, or even birth complications may play a role. However, there is no proof of this relationship. SYMPTOMS  Symptoms are those of both schizophrenia and mood disorders. These may include:  Hearing, seeing, or feeling things that are not there (hallucinations).  False beliefs (delusions).  Not taking care of oneself (for example, not bathing or grooming).  Speaking in a way that makes no sense to others.  Withdrawing or feeling isolated from other people.  Thoughts that race from one idea to the next.  Feelings of sadness, guilt, hopelessness, and anxiety.  Feelings of being excessively happy, powerful, or energetic.  Feeling drained of energy.  Losing or gaining weight.  Being unable to concentrate.  Sleeping more or less than normal. DIAGNOSIS  The diagnosis is made when the patient has features of both illnesses. The patient does not strictly have schizophrenia or a mood disorder alone. Unfortunately, determining if a patient has 2 separate illnesses (schizophrenia or a mood disorder), a combination of illnesses (schizophrenia and a mood disorder), or perhaps even a separate illness apart from  schizophrenia or a mood disorder is difficult. An accurate diagnosis has 3 key parts:  The patient clearly has a major depressive disorder or mania.  They meet the criteria for schizophrenia (distorted thinking, hallucinations or delusions).  The patient must have psychosis for at least 2 weeks without a mood disorder. The diagnosis of this disorder frequently requires several evaluations. Caregivers will evaluate the patient's symptoms over a length of time. The diagnosis can be made when the following are observed:  An uninterrupted period of mental illness (possibly lasting weeks or months) occurs during which a major depressive episode, a manic episode (both are mood disorder components), or a mixed episode occurs with symptoms of schizophrenia. A major depressive episode must include a depressed mood. Also:  This uninterrupted period of illness includes a minimum 2 week episode of active distorted thinking, hallucinations or delusions (schizophrenia feature).  During this same 2 week (or greater) period, the patient has no mood changes of depression or mania.  Outside of the specific 2 week period noted above, mood episodes are present for a big part of the total active and lingering periods of illness.  The disturbance is not due to the direct effects of a substance (such as street drugs or prescription medications) or a general medical condition.  The bipolar type is diagnosed if the disturbance includes a period of mania, a major depressive episode or a mixed episode.  The depressive type is diagnosed if the problem includes only major depressive episodes. TREATMENT  Treatment usually includes a combination of medications and counseling. The exact regimen varies depending on the type and severity of symptoms. It also depends on whether the disorder is a depressive-type or bipolar-type. Medications are prescribed  to help with psychotic symptoms, stabilize mood and treat depression.  Psychotherapy can help curb distorted thoughts, teach appropriate social skills and decrease social isolation. Medications may include:  Antipsychotics. These are also called neuroleptics. These are prescribed to help with psychotic symptoms such as delusions, paranoia and hallucinations. Common medications in this category include clozapine (Clozaril), risperidone (Risperdal) and olanzapine (Zyprexa).  Mood-stabilizing medications. When the schizoaffective disorder is bipolar-type, mood stabilizers can level out the highs and lows of bipolar disorder, also known as manic depression. People with bipolar disorder have episodes of mania and depressed mood. Examples of mood stabilizers include lithium (Eskalith, Lithobid) and divalproex (Depakote).  Antidepressants. When depression is the underlying mood disorder, antidepressants can help with feelings of sadness, hopelessness, or difficulty with sleep and concentration. Common medications include citalopram (Celexa), fluoxetine and escitalopram (Lexapro). Non-medication treatment may include:  Psychotherapy and counseling. Building a trusting relationship in therapy can help people with schizoaffective disorder better understand their condition and feel hopeful about their future. Effective sessions focus on real-life plans, problems, and relationships. Skills and behaviors specific to the home or workplace may be discussed.  Family or group therapy. Treatment can be more effective when people with schizoaffective disorder can discuss their problems with others. Supportive group settings can help decrease social isolation and provide a reality check during periods of psychosis. In general, people with schizoaffective disorder have a better prognosis than people with schizophrenia, but not as good as people with mood disorders only. Long-term treatment is necessary The prognosis varies from person to person. HOME CARE INSTRUCTIONS   Take your medicine  as prescribed, even when you are feeling and thinking well.  Participate in individual and group counseling as recommended by your caregiver. SEEK MEDICAL CARE IF:   You feel that you are experiencing side effects from prescription medications.  Contact your caregiver if you feel that symptoms are worsening despite using medication as prescribed and participating in counseling/group therapy sessions. SEEK IMMEDIATE MEDICAL CARE IF:  You feel an urge to hurt yourself or are thinking about committing suicide. This is an emergency and you should be evaluated immediately. MAKE SURE YOU:   Understand these instructions.  Will watch your condition.  Will get help right away if you are not doing well or get worse. Document Released: 03/15/2007 Document Revised: 01/25/2012 Document Reviewed: 05/16/2007 [facility] Patient Information 2014 [place], [place].

## 2013-08-23 NOTE — Progress Notes (Signed)
[name]  Provider:  Melvyn Novas, M D  Referring Provider: Ernestina Penna, MD Primary Care Physician:  Rudi Heap, MD  Chief Complaint  Patient presents with  . Medication Refill    Pt is in need of refills  at this visit    HPI:  Erin Donaldson is a 62 y.o. female  Is seen here as a referral/ revisit  from [name].   [name] had been seen through the 1990s at my clinic for changing symptoms of headaches and spells, and developed significant mood changes. She  transferred  her care to [name], per Dr. [name] diagnosed with  a dementia.   The patient then returned her care and is a 14 she has more and more folds at the time and lapses of awareness she developed a shuffling gait. At the time she had been diagnosed with bipolar depression and depression medications were prescribed by Dr. Pricilla Riffle. The shuffling gait was attributed to the use of lipid medication.  She was treated with an EMSAM .400 mg  milligrams at night of Seroquel.   The patient has a history of white matter changes , documented by MRI - my last films are  from 2007 ,she underwent a PET scan of the brain in June 2011 at [facility]. This certifeid  the diagnosis of dementia.  In my last visit on 04/07/2012 the patient presented with a Mini-Mental score of 30 out of 30 and I have doubts about the dementia diagnosis. Husband and daughter have the time reported that she has had fluctuations between days which he functions very well and others that he functions very poorly sometimes he cognitive poor days of performance also swells when she has more gait problems and stability problems.    Her  PCP primary care physician is still [name].   Review of Systems: Out of a complete 14 system review, the patient complains of only the following symptoms, and all other reviewed systems are negative. The patient reports easy bruising, increased thirst  units, constipation poverty of speech. Since our last visit her husband has passed, he was diagnosed with an MI. There is no dysarthria reported, no sleep concerns. She is grieving .     the patient's current medications still include 800 milligrams of Seroquel at bedtime and 400 mg in AM,  Klonopin once in the morning, gabapentin 600 mg 3 times a day, Aricept 10 mg daily, Flonase when necessary ciprofloxacin was given for a urinary tract infection. Maxalt when necessary , torsemide  20 mg daily as a diuretic.  History   Social History  . Marital Status: Widowed    Spouse Name: [name]    Number of Children: 1  . Years of Education: college   Occupational History  . Disabled    Social History Main Topics  . Smoking status: Never Smoker   . Smokeless tobacco: Not on file  . Alcohol Use: No     Comment: rare  . Drug Use: No  . Sexual Activity: Not on file   Other Topics Concern  . Not on file   Social History Narrative  . No narrative on file    Family History  Problem Relation Age of Onset  . Kidney disease Father   . Heart disease Father   . Heart disease Mother   . Heart disease Maternal Grandfather   . Heart disease Brother   . Heart disease Maternal Aunt   .  Heart disease Maternal Uncle   . Heart disease Paternal Aunt   . Heart disease Paternal Uncle   . Breast cancer Mother   . Colon cancer Neg Hx   . Diabetes Father   . Diabetes Maternal Aunt   . Diabetes Maternal Uncle   . Diabetes Paternal Aunt   . Diabetes Paternal Uncle   . Cancer Mother     bladder    Past Medical History  Diagnosis Date  . Migraine   . Bipolar affective   . Anxiety   . Hyperlipidemia   . Hypertension   . GERD (gastroesophageal reflux disease)   . Diabetes in pregnancy   . Diabetes mellitus   . IBS (irritable bowel syndrome)   . Anemia   . Elevated LFTs   . CVA (cerebral infarction)   . Depression   . Memory loss     Past Surgical History  Procedure Laterality Date  .  Cholecystectomy    . Gastric bypass  2005    Current Outpatient Prescriptions  Medication Sig Dispense Refill  . donepezil (ARICEPT) 10 MG tablet Take 1 tablet (10 mg total) by mouth daily.  30 tablet  0  . fluticasone (FLONASE) 50 MCG/ACT nasal spray Place 1 spray into the nose daily. One spray in each nostril daily.  16 g  6  . gabapentin (NEURONTIN) 600 MG tablet Take 600 mg by mouth 3 (three) times daily.      Marland Kitchen lamoTRIgine (LAMICTAL) 200 MG tablet Take 200 mg by mouth daily.      Marland Kitchen omeprazole (PRILOSEC) 40 MG capsule TAKE 1 CAPSULE BY MOUTH ONCE DAILY  30 capsule  3  . QUEtiapine (SEROQUEL) 200 MG tablet Take 200 mg by mouth daily.      . QUEtiapine (SEROQUEL) 400 MG tablet Take 800 mg by mouth at bedtime.       . torsemide (DEMADEX) 20 MG tablet TAKE 1 TABLET BY MOUTH ONCE DAILY  30 tablet  2  . rizatriptan (MAXALT) 10 MG tablet Take 10 mg by mouth as needed. May repeat in 2 hours if needed       No current facility-administered medications for this visit.    Allergies as of 08/23/2013 - Review Complete 08/23/2013  Allergen Reaction Noted  . Amoxicillin  03/20/2013  . Darvocet [propoxyphene-acetaminophen]  03/20/2013  . Decongestant [oxymetazoline]  03/20/2013  . Elavil [amitriptyline]  03/20/2013  . Risperidone and related  03/20/2013  . Ultram [tramadol]  03/20/2013    Vitals: BP 117/71  Pulse 92  Resp 16  Ht 5\' 6"  (1.676 m)  Wt 166 lb (75.297 kg)  BMI 26.81 kg/m2 Last Weight:  Wt Readings from Last 1 Encounters:  08/23/13 166 lb (75.297 kg)   Last Height:   Ht Readings from Last 1 Encounters:  08/23/13 5\' 6"  (1.676 m)    Physical exam:  General: The patient is awake, alert and appears not in acute distress. The patient is well groomed. Head: Normocephalic, atraumatic. Neck is supple. Mallampati 3 , neck circumference: 14, nasal breathing unrestricted.  Cardiovascular:  Regular rate and rhythm, without  murmurs or carotid bruit, and without distended neck  veins. Respiratory: Lungs are clear to auscultation. Skin:  Without evidence of edema, or rash Trunk: BMI is elevated.  Neurologic exam : The patient is awake and alert, oriented to place and time.  Memory subjective   described as intact. There is a normal attention span & concentration ability.  Speech is fluent without  dysarthria,  dysphonia or aphasia. Mood and affect are appropriate.  Cranial nerves: Pupils are equal and briskly reactive to light. Funduscopic exam without   evidence of pallor or edema. Extraocular movements  in vertical and horizontal planes intact and without nystagmus. Visual fields by finger perimetry are intact. Hearing to finger rub intact.  Facial sensation intact to fine touch. Facial motor strength is symmetric and tongue and uvula move midline.  Motor exam:  The patient has some hip abductor weakness but not associated cogwheeling. Her grip strength was well preserved but she has occasionally a twitching in her upper extremities. Hip flexion and hip adduction were not as weak as the duction. She is able to for perform a finger-to-nose exams monthly. She can ambulate without a cane or assistive device. Normal tone ,normal strength in upper extremities.  Sensory:  Fine touch, pinprick and vibration were tested in all extremities. Proprioception is normal.  Coordination: Rapid alternating movements in the fingers/hands is tested and normal. Finger-to-nose maneuver tested and normal without evidence of ataxia, dysmetria or tremor.  Gait and station: Patient walks without assistive device. Stance is stable and normal. Tandem gait is unfragmented. Romberg testing is  normal.  Deep tendon reflexes: in the  upper and lower extremities are symmetric and intact. Babinski maneuver response is  downgoing.   Assessment:  After physical and neurologic examination, review of laboratory studies, imaging, neurophysiology testing and pre-existing records, assessment is that of a  patient with affect relate changes in cognition. Amnestic spells under stress.  MMSE 30-30, clock face 4-4 , and able to abstract . This patient is not demented.   Plan:  Treatment plan and additional workup : I am happy to refill maxalt and  Schedule a RV with NP in 12 month.  we discussed continuation versus discontinuation of Aricept , she opted for cont. Today 35 minute visit. Cont.  grief counseling.

## 2013-08-23 NOTE — Addendum Note (Signed)
Addended by: Melvyn Novas on: 08/23/2013 02:35 PM   Modules accepted: Orders

## 2013-09-07 ENCOUNTER — Other Ambulatory Visit: Payer: Self-pay | Admitting: Neurology

## 2013-09-13 ENCOUNTER — Other Ambulatory Visit: Payer: Self-pay | Admitting: Family Medicine

## 2013-09-21 ENCOUNTER — Other Ambulatory Visit: Payer: Self-pay

## 2013-10-24 ENCOUNTER — Other Ambulatory Visit: Payer: Self-pay | Admitting: Family Medicine

## 2013-11-10 ENCOUNTER — Ambulatory Visit: Payer: Medicare Other | Admitting: Nurse Practitioner

## 2013-11-13 ENCOUNTER — Ambulatory Visit (INDEPENDENT_AMBULATORY_CARE_PROVIDER_SITE_OTHER): Payer: Medicare Other

## 2013-11-13 ENCOUNTER — Encounter: Payer: Self-pay | Admitting: Family Medicine

## 2013-11-13 ENCOUNTER — Other Ambulatory Visit: Payer: Self-pay | Admitting: Family Medicine

## 2013-11-13 ENCOUNTER — Telehealth: Payer: Self-pay | Admitting: Family Medicine

## 2013-11-13 ENCOUNTER — Ambulatory Visit (INDEPENDENT_AMBULATORY_CARE_PROVIDER_SITE_OTHER): Payer: Medicare Other | Admitting: Family Medicine

## 2013-11-13 VITALS — BP 159/90 | HR 103 | Temp 98.5°F | Ht 66.0 in | Wt 179.0 lb

## 2013-11-13 DIAGNOSIS — M25579 Pain in unspecified ankle and joints of unspecified foot: Secondary | ICD-10-CM

## 2013-11-13 DIAGNOSIS — K130 Diseases of lips: Secondary | ICD-10-CM

## 2013-11-13 DIAGNOSIS — R21 Rash and other nonspecific skin eruption: Secondary | ICD-10-CM

## 2013-11-13 DIAGNOSIS — M25571 Pain in right ankle and joints of right foot: Secondary | ICD-10-CM

## 2013-11-13 MED ORDER — NAPROXEN 500 MG PO TABS: 500.0000 mg | ORAL_TABLET | Freq: Two times a day (BID) | ORAL | Status: DC

## 2013-11-13 MED ORDER — NYSTATIN 100000 UNIT/ML MT SUSP: 5.0000 mL | Freq: Four times a day (QID) | OROMUCOSAL | Status: DC

## 2013-11-13 MED ORDER — CLOTRIMAZOLE-BETAMETHASONE 1-0.05 % EX CREA: 1.0000 "application " | TOPICAL_CREAM | Freq: Two times a day (BID) | CUTANEOUS | Status: DC

## 2013-11-13 MED ORDER — NYSTATIN 100000 UNIT/GM EX CREA: 1.0000 "application " | TOPICAL_CREAM | Freq: Two times a day (BID) | CUTANEOUS | Status: DC

## 2013-11-13 MED ORDER — HYDROCODONE-ACETAMINOPHEN 10-325 MG PO TABS: 1.0000 | ORAL_TABLET | Freq: Three times a day (TID) | ORAL | Status: DC | PRN

## 2013-11-13 NOTE — Telephone Encounter (Signed)
appt today at 3:00 with bill

## 2013-11-13 NOTE — Progress Notes (Signed)
   Subjective:    Patient ID: RAKEISHA NYCE, female    DOB: [birth year], 62 y.o.   MRN: [id number]  HPI This 62 y.o. female presents for evaluation of pain and swelling right ankle for 3 days. She has not injured her right ankle.  She states the swelling and discomfort came on  Slowly and have progressed to where the pain is constant and worse. She has a rash on her Mouth and her hands.   Review of Systems    No chest pain, SOB, HA, dizziness, vision change, N/V, diarrhea, constipation, dysuria, urinary urgency or frequency, myalgias, arthralgias or rash.  Objective:   Physical Exam  Vital signs noted  Well developed well nourished female.  HEENT - Head atraumatic Normocephalic                Eyes - PERRLA, Conjuctiva - clear Sclera- Clear EOMI                Ears - EAC's Wnl TM's Wnl Gross Hearing WNL                Throat - oropharanx wnl Respiratory - Lungs CTA bilateral Cardiac - RRR S1 and S2 without murmur GI - Abdomen soft Nontender and bowel sounds active x 4 MS - Right lateral malleolus swollen and tender Extremities - No edema. Neuro - Grossly intact. Skin - angular cheilitis bilateral and right palm with angular erythematous rash.     Assessment & Plan:  Right ankle pain - Plan: naproxen (NAPROSYN) 500 MG tablet, HYDROcodone-acetaminophen (NORCO) 10-325 MG per tablet, Arthritis Panel, CANCELED: DG Foot Complete Right  Rash and nonspecific skin eruption - Plan: clotrimazole-betamethasone (LOTRISONE) cream, DISCONTINUED: nystatin (MYCOSTATIN) 100000 UNIT/ML suspension  Angular cheilitis - Plan: nystatin cream (MYCOSTATIN)  Deatra Canter FNP

## 2013-11-14 ENCOUNTER — Other Ambulatory Visit: Payer: Self-pay | Admitting: Family Medicine

## 2013-11-14 ENCOUNTER — Ambulatory Visit: Payer: Medicare Other | Admitting: Family Medicine

## 2013-11-14 DIAGNOSIS — E790 Hyperuricemia without signs of inflammatory arthritis and tophaceous disease: Secondary | ICD-10-CM

## 2013-11-14 LAB — ARTHRITIS PANEL
Anti Nuclear Antibody(ANA): NEGATIVE
Rhuematoid fact SerPl-aCnc: 7.8 IU/mL (ref 0.0–13.9)
Sed Rate: 18 mm/hr (ref 0–40)
Uric Acid: 8.3 mg/dL — ABNORMAL HIGH (ref 2.5–7.1)

## 2013-11-14 MED ORDER — ALLOPURINOL 100 MG PO TABS: 100.0000 mg | ORAL_TABLET | Freq: Every day | ORAL | Status: DC

## 2013-11-20 ENCOUNTER — Telehealth: Payer: Self-pay | Admitting: Family Medicine

## 2013-11-20 DIAGNOSIS — M25571 Pain in right ankle and joints of right foot: Secondary | ICD-10-CM

## 2013-11-20 NOTE — Telephone Encounter (Signed)
Needs another round of nystatin. Her mouth was healing but then she ran out of medication.   Continues to complain of ankle pain and would like a refill on pain medication.  I am going setup an ortho referral as well.

## 2013-11-21 ENCOUNTER — Other Ambulatory Visit: Payer: Self-pay | Admitting: Family Medicine

## 2013-11-21 ENCOUNTER — Telehealth: Payer: Self-pay | Admitting: Family Medicine

## 2013-11-21 DIAGNOSIS — K130 Diseases of lips: Secondary | ICD-10-CM

## 2013-11-21 MED ORDER — NYSTATIN 100000 UNIT/GM EX CREA: 1.0000 "application " | TOPICAL_CREAM | Freq: Two times a day (BID) | CUTANEOUS | Status: DC

## 2013-11-21 NOTE — Telephone Encounter (Signed)
Patient wasn't aware of the gout diagnosis.  I explained this to patient and that she should have a prescription for allopurinol waiting at the pharmacy. If symptoms don't improve over the next few days she needs to f/u. Pt agreeable to this.

## 2013-11-21 NOTE — Telephone Encounter (Signed)
I think she is having gout attack and she can follow up to discuss, I will send in some more nystatin

## 2013-11-29 ENCOUNTER — Other Ambulatory Visit: Payer: Self-pay | Admitting: Family Medicine

## 2013-11-29 DIAGNOSIS — E790 Hyperuricemia without signs of inflammatory arthritis and tophaceous disease: Secondary | ICD-10-CM

## 2013-11-29 MED ORDER — ALLOPURINOL 100 MG PO TABS: 100.0000 mg | ORAL_TABLET | Freq: Every day | ORAL | Status: DC

## 2013-12-04 ENCOUNTER — Encounter: Payer: Self-pay | Admitting: Family Medicine

## 2013-12-04 ENCOUNTER — Other Ambulatory Visit: Payer: Self-pay | Admitting: Family Medicine

## 2013-12-04 ENCOUNTER — Ambulatory Visit (INDEPENDENT_AMBULATORY_CARE_PROVIDER_SITE_OTHER): Payer: Medicare Other | Admitting: Family Medicine

## 2013-12-04 ENCOUNTER — Telehealth: Payer: Self-pay | Admitting: Family Medicine

## 2013-12-04 ENCOUNTER — Ambulatory Visit: Payer: Medicare Other | Admitting: Family Medicine

## 2013-12-04 VITALS — BP 124/75 | HR 104 | Temp 98.6°F | Ht 66.0 in | Wt 182.0 lb

## 2013-12-04 DIAGNOSIS — M25579 Pain in unspecified ankle and joints of unspecified foot: Secondary | ICD-10-CM

## 2013-12-04 DIAGNOSIS — M25571 Pain in right ankle and joints of right foot: Secondary | ICD-10-CM

## 2013-12-04 DIAGNOSIS — R21 Rash and other nonspecific skin eruption: Secondary | ICD-10-CM

## 2013-12-04 DIAGNOSIS — L0291 Cutaneous abscess, unspecified: Secondary | ICD-10-CM

## 2013-12-04 DIAGNOSIS — L0390 Cellulitis, unspecified: Secondary | ICD-10-CM

## 2013-12-04 DIAGNOSIS — L299 Pruritus, unspecified: Secondary | ICD-10-CM

## 2013-12-04 MED ORDER — METHYLPREDNISOLONE ACETATE 40 MG/ML IJ SUSP: 40.0000 mg | Freq: Once | INTRAMUSCULAR | Status: DC

## 2013-12-04 MED ORDER — FIRST-DUKES MOUTHWASH MT SUSP: 5.0000 mL | Freq: Four times a day (QID) | OROMUCOSAL | Status: DC | PRN

## 2013-12-04 MED ORDER — SULFAMETHOXAZOLE-TMP DS 800-160 MG PO TABS: 1.0000 | ORAL_TABLET | Freq: Two times a day (BID) | ORAL | Status: DC

## 2013-12-04 MED ORDER — NAPROXEN 500 MG PO TABS: 500.0000 mg | ORAL_TABLET | Freq: Two times a day (BID) | ORAL | Status: DC

## 2013-12-04 MED ORDER — HYDROCODONE-ACETAMINOPHEN 10-325 MG PO TABS: 1.0000 | ORAL_TABLET | Freq: Three times a day (TID) | ORAL | Status: DC | PRN

## 2013-12-04 NOTE — Patient Instructions (Signed)

## 2013-12-04 NOTE — Progress Notes (Signed)
   Subjective:    Patient ID: Erin MayhewLynn D Donaldson, female    DOB: Oct 02, 1951, 63 y.o.   MRN: [id number]  HPI This 63 y.o. female presents for evaluation of pain in her right ankle and foot.  She was seen a month ago and she had redness swelling and discomfort in her lateral malleolus and she had elevated Uric acid so she was empirically tx'd with allopurinol and naprosyn.  The pain in her right foot has not Resolved and she has been having difficulty with walking.  Her initial xrays were negative for fx.  She Has been having right finger cellulitis along her right 3rd finger nail.  She has been applying H202 And neosporin ointment.   Review of Systems  No chest pain, SOB, HA, dizziness, vision change, N/V, diarrhea, constipation, dysuria, urinary urgency or frequency, myalgias, arthralgias or rash.     Objective:   Physical Exam  Vital signs noted  Well developed well nourished female.  HEENT - Head atraumatic Normocephalic                Eyes - PERRLA, Conjuctiva - clear Sclera- Clear EOMI                Ears - EAC's Wnl TM's Wnl Gross Hearing WNL                Nose - Nares patent                 Throat - oropharanx wnl Respiratory - Lungs CTA bilateral Cardiac - RRR S1 and S2 without murmur GI - Abdomen soft Nontender and bowel sounds active x 4 Extremities - No edema. Neuro - Grossly intact. MS - TTP right ankle at lateral malleolus.  No deformity      Assessment & Plan:  Right ankle pain - Plan: naproxen (NAPROSYN) 500 MG tablet, Ambulatory referral to Orthopedic Surgery  Cellulitis - Plan: sulfamethoxazole-trimethoprim (BACTRIM DS) 800-160 MG per tablet  Rash and nonspecific skin eruption - Plan: methylPREDNISolone acetate (DEPO-MEDROL) injection 40 mg  Itching - Plan: methylPREDNISolone acetate (DEPO-MEDROL) injection 40 mg  Deatra CanterWilliam J Oxford FNP

## 2013-12-07 ENCOUNTER — Telehealth: Payer: Self-pay | Admitting: Family Medicine

## 2013-12-07 ENCOUNTER — Other Ambulatory Visit: Payer: Self-pay | Admitting: Family Medicine

## 2013-12-07 NOTE — Telephone Encounter (Signed)
Left message on home voicemail that she would need to be seen and that she can call our office to schedule an appointment.

## 2013-12-07 NOTE — Telephone Encounter (Signed)
Don't feel comfortable rx these meds and need to follow up if she wants them.

## 2013-12-08 NOTE — Telephone Encounter (Signed)
She needs to take tylenol and motrin and give the referral process a little time

## 2013-12-13 ENCOUNTER — Other Ambulatory Visit: Payer: Self-pay

## 2013-12-13 DIAGNOSIS — M25571 Pain in right ankle and joints of right foot: Secondary | ICD-10-CM

## 2013-12-18 ENCOUNTER — Other Ambulatory Visit: Payer: Self-pay | Admitting: Family Medicine

## 2013-12-19 ENCOUNTER — Other Ambulatory Visit: Payer: Self-pay | Admitting: Family Medicine

## 2013-12-20 ENCOUNTER — Telehealth: Payer: Self-pay | Admitting: Family Medicine

## 2013-12-20 NOTE — Telephone Encounter (Signed)
See note from pharmacy attached to refill

## 2013-12-22 ENCOUNTER — Encounter: Payer: Self-pay | Admitting: Physician Assistant

## 2013-12-22 ENCOUNTER — Ambulatory Visit: Payer: Medicare Other | Admitting: Physician Assistant

## 2013-12-22 ENCOUNTER — Ambulatory Visit (INDEPENDENT_AMBULATORY_CARE_PROVIDER_SITE_OTHER): Payer: Medicare Other | Admitting: Physician Assistant

## 2013-12-22 VITALS — BP 145/79 | HR 92 | Temp 97.0°F | Ht 66.0 in | Wt 188.0 lb

## 2013-12-22 DIAGNOSIS — L0291 Cutaneous abscess, unspecified: Secondary | ICD-10-CM

## 2013-12-22 DIAGNOSIS — L0390 Cellulitis, unspecified: Secondary | ICD-10-CM

## 2013-12-22 DIAGNOSIS — M25579 Pain in unspecified ankle and joints of unspecified foot: Secondary | ICD-10-CM

## 2013-12-22 MED ORDER — COLCHICINE 0.6 MG PO CAPS: 0.6000 mg | ORAL_CAPSULE | Freq: Once | ORAL | Status: DC

## 2013-12-22 MED ORDER — MELOXICAM 7.5 MG PO TABS: ORAL_TABLET | ORAL | Status: DC

## 2013-12-22 MED ORDER — HYDROCODONE-ACETAMINOPHEN 5-325 MG PO TABS: 1.0000 | ORAL_TABLET | Freq: Four times a day (QID) | ORAL | Status: DC | PRN

## 2013-12-22 MED ORDER — METHYLPREDNISOLONE ACETATE 80 MG/ML IJ SUSP
40.0000 mg | Freq: Once | INTRAMUSCULAR | Status: AC
  Administered 2013-12-22: 40 mg via INTRAMUSCULAR

## 2013-12-22 NOTE — Progress Notes (Signed)
   Subjective:    Patient ID: Erin Donaldson, female    DOB: 08/07/1951, 63 y.o.   MRN: [id number]  HPI 63 y/o female presents with ankle pain x 2-3 wks. That began when she woke up one morning. Denies trauma. She has an appt with Ortho on 12/28/13 but the pain is worsening. She was evaluated and treated with allopurinol and naproxen for Acute Gout on 11/29/13 due to elevated Uric acid levels with no relief. xrays at that time were negative for fracture or dislocation.     Review of Systems  Constitutional: Negative for fever, chills, diaphoresis and fatigue.  Musculoskeletal: Positive for arthralgias and joint swelling (ankle).       Objective:   Physical Exam  Nursing note and vitals reviewed. Constitutional: She is oriented to person, place, and time. She appears well-developed and well-nourished.  Pulmonary/Chest: Effort normal. No respiratory distress.  Neurological: She is alert and oriented to person, place, and time.  Skin: There is erythema.  Erythematous finger with mild drainage. Currently undergoing treatment with Bactrim DS for cellulitis          Assessment & Plan:  1. Acute gout flare: Rx Colchicine .6mg  x 3 doses for acute flare. Patient should take 2 pills initially, followed by additional dose in 1 hour. Follow with mobic 7.5 mg daily for anti-inflammatory. Patient is scheduled to f/u with ortho this week. Due to her intense pain, I prescribed her Norco for pain until she is seen in clinic. No refills will be provided. I have also advised her to elevate, and use ice. Stop taking allopurinol until directed otherwise on f/u visit, as this should not be used in an acute flare.   2. Cellulitis of finger: Bacterial culture was taken to determine efficacy of antibiotic based on culture results. Culture was + for MRSA that is sensitive to current tx plan with Bactrim DS. If patient needs further abx treatment at a future visit, I suggest Doxycycline 100mg  BID x 10 days.    Patient should RTC if s/s worsen or do not improve. She should f/u with ortho this week as planned for further evaluation.

## 2013-12-22 NOTE — Patient Instructions (Signed)
Rest, Ice, compression, elevation. F/u with ortho on Thursday.

## 2013-12-24 LAB — AEROBIC CULTURE

## 2014-01-04 ENCOUNTER — Telehealth: Payer: Self-pay | Admitting: Family Medicine

## 2014-01-04 NOTE — Telephone Encounter (Signed)
appt scheduled

## 2014-01-11 ENCOUNTER — Ambulatory Visit: Payer: Medicare Other | Admitting: Family Medicine

## 2014-01-15 ENCOUNTER — Ambulatory Visit (INDEPENDENT_AMBULATORY_CARE_PROVIDER_SITE_OTHER): Payer: Medicare Other

## 2014-01-15 ENCOUNTER — Encounter: Payer: Self-pay | Admitting: Family Medicine

## 2014-01-15 ENCOUNTER — Ambulatory Visit (INDEPENDENT_AMBULATORY_CARE_PROVIDER_SITE_OTHER): Payer: Medicare Other | Admitting: Family Medicine

## 2014-01-15 VITALS — BP 145/86 | HR 100 | Temp 97.4°F | Ht 66.0 in | Wt 185.0 lb

## 2014-01-15 DIAGNOSIS — M25571 Pain in right ankle and joints of right foot: Secondary | ICD-10-CM

## 2014-01-15 DIAGNOSIS — E119 Type 2 diabetes mellitus without complications: Secondary | ICD-10-CM

## 2014-01-15 DIAGNOSIS — M25579 Pain in unspecified ankle and joints of unspecified foot: Secondary | ICD-10-CM

## 2014-01-15 DIAGNOSIS — I10 Essential (primary) hypertension: Secondary | ICD-10-CM

## 2014-01-15 DIAGNOSIS — R0602 Shortness of breath: Secondary | ICD-10-CM

## 2014-01-15 DIAGNOSIS — IMO0002 Reserved for concepts with insufficient information to code with codable children: Secondary | ICD-10-CM

## 2014-01-15 DIAGNOSIS — Z9884 Bariatric surgery status: Secondary | ICD-10-CM

## 2014-01-15 DIAGNOSIS — E559 Vitamin D deficiency, unspecified: Secondary | ICD-10-CM

## 2014-01-15 MED ORDER — DOXYCYCLINE HYCLATE 100 MG PO TABS: 100.0000 mg | ORAL_TABLET | Freq: Two times a day (BID) | ORAL | Status: DC

## 2014-01-15 MED ORDER — COLCHICINE 0.6 MG PO CAPS: 0.6000 mg | ORAL_CAPSULE | Freq: Once | ORAL | Status: DC

## 2014-01-15 MED ORDER — NYSTATIN 100000 UNIT/ML MT SUSP: 5.0000 mL | Freq: Four times a day (QID) | OROMUCOSAL | Status: DC | PRN

## 2014-01-15 MED ORDER — FUROSEMIDE 20 MG PO TABS: 20.0000 mg | ORAL_TABLET | Freq: Every day | ORAL | Status: DC

## 2014-01-15 NOTE — Progress Notes (Signed)
Subjective:    Patient ID: Erin Donaldson, female    DOB: 01-04-1951, 63 y.o.   MRN: [id number]  HPI Patient here today for edema and continuing right ankle pain. The patient also complains of some shortness of breath. The x-ray was done the end of December and that is about 2 months ago. She also has a history of a MRSA infection of the nail of the thumb. An antibiotic it was recommended that was never received by the patient. The MRSA infection is sensitive to tetracycline.      Patient Active Problem List   Diagnosis Date Noted  . Amnestic disorder due to medical condition 08/23/2013  . Diarrhea 07/19/2013  . BIPOLAR AFFECTIVE DISORDER 08/26/2009  . ANXIETY 08/26/2009  . MIGRAINE HEADACHE 08/26/2009  . DIABETES MELLITUS, TYPE II 08/15/2009  . HYPERLIPIDEMIA 08/15/2009  . HYPERTENSION 08/15/2009  . GERD 08/15/2009  . CONSTIPATION 08/15/2009  . IRRITABLE BOWEL SYNDROME 08/15/2009   Outpatient Encounter Prescriptions as of 01/15/2014  Medication Sig  . allopurinol (ZYLOPRIM) 100 MG tablet Take 1 tablet (100 mg total) by mouth daily.  Marland Kitchen. donepezil (ARICEPT) 10 MG tablet TAKE 1 TABLET (10 MG TOTAL) BY MOUTH DAILY.  . fluticasone (FLONASE) 50 MCG/ACT nasal spray Place 1 spray into the nose daily. One spray in each nostril daily.  Marland Kitchen. gabapentin (NEURONTIN) 300 MG capsule TAKE 3 CAPSULE BY MOUTH THREE TIMES A DAY  . HYDROcodone-acetaminophen (NORCO/VICODIN) 5-325 MG per tablet Take 1 tablet by mouth every 6 (six) hours as needed for moderate pain.  Marland Kitchen. lamoTRIgine (LAMICTAL) 200 MG tablet Take 200 mg by mouth daily.  Marland Kitchen. omeprazole (PRILOSEC) 40 MG capsule TAKE 2 CAPSULES (80 MG TOTAL) BY MOUTH DAILY.  Marland Kitchen. propranolol ER (INDERAL LA) 60 MG 24 hr capsule Take 1 capsule (60 mg total) by mouth daily.  . QUEtiapine (SEROQUEL) 200 MG tablet Take 200 mg by mouth daily.  . QUEtiapine (SEROQUEL) 400 MG tablet Take 800 mg by mouth at bedtime.   . rizatriptan (MAXALT) 10 MG tablet Take 1 tablet (10 mg  total) by mouth as needed. May repeat in 2 hours if needed  . [DISCONTINUED] omeprazole (PRILOSEC) 40 MG capsule TAKE 1 CAPSULE BY MOUTH ONCE DAILY  . Colchicine 0.6 MG CAPS Take 0.6 mg by mouth once.  . meloxicam (MOBIC) 7.5 MG tablet Take 2 pills initially, then take 3rd pill 1 hour later.  . naproxen (NAPROSYN) 500 MG tablet TAKE 1 TABLET (500 MG TOTAL) BY MOUTH 2 (TWO) TIMES DAILY WITH A MEAL.  Marland Kitchen. nystatin cream (MYCOSTATIN) Apply 1 application topically 2 (two) times daily.  Marland Kitchen. torsemide (DEMADEX) 20 MG tablet TAKE 1 TABLET BY MOUTH ONCE DAILY  . [DISCONTINUED] sulfamethoxazole-trimethoprim (BACTRIM DS) 800-160 MG per tablet Take 1 tablet by mouth 2 (two) times daily.    Review of Systems  Constitutional: Negative.   HENT: Negative.   Eyes: Negative.   Respiratory: Positive for shortness of breath (little).   Cardiovascular: Positive for leg swelling.  Gastrointestinal: Negative.   Endocrine: Negative.   Genitourinary: Negative.   Musculoskeletal: Negative.        Right ankle pain- continues  Skin: Negative.   Allergic/Immunologic: Negative.   Neurological: Negative.   Hematological: Negative.   Psychiatric/Behavioral: Negative.        Objective:   Physical Exam  Nursing note and vitals reviewed. Constitutional: She is oriented to person, place, and time. She appears well-developed and well-nourished. She appears distressed.  Patient is alert but distressed when  all of the multiple things going on in her life with her family and the loss of her husband.  HENT:  Head: Normocephalic and atraumatic.  Eyes: Conjunctivae and EOM are normal. Pupils are equal, round, and reactive to light. Right eye exhibits no discharge. Left eye exhibits no discharge. No scleral icterus.  Neck: Normal range of motion. Neck supple.  Cardiovascular: Normal rate, regular rhythm, normal heart sounds and intact distal pulses.  Exam reveals no gallop and no friction rub.   No murmur  heard. Pulmonary/Chest: Effort normal and breath sounds normal. No respiratory distress. She has no wheezes. She has no rales. She exhibits no tenderness.  Abdominal: Soft. Bowel sounds are normal. She exhibits no mass. There is no tenderness. There is no rebound and no guarding.  Musculoskeletal: Normal range of motion. She exhibits no edema and no tenderness.  There is tenderness anterior to the right lateral malleolar  Neurological: She is alert and oriented to person, place, and time. She has normal reflexes. No cranial nerve deficit.  Skin: Skin is warm and dry. There is erythema (involving the nail bed of the thumb ).  Psychiatric: She has a normal mood and affect. Her behavior is normal. Judgment and thought content normal.  Depressed affect   BP 145/86  Pulse 100  Temp(Src) 97.4 F (36.3 C) (Oral)  Ht 5\' 6"  (1.676 m)  Wt 185 lb (83.915 kg)  BMI 29.87 kg/m2  SpO2 98%  [facility] reading (PRIMARY) by  Dr.Shondrika Hoque-right ankle  --- there is a healing fracture of the right lateral malleolus with good callus formation.                                    Assessment & Plan:  1. History of gastric bypass  2. HYPERTENSION  3. Right ankle pain - DG Ankle Complete Right; Future  4. SOB (shortness of breath) - DG Chest 2 View; Future  5. Nail bed infection -Start doxycycline 100 twice daily  Meds ordered this encounter  Medications  . doxycycline (VIBRA-TABS) 100 MG tablet    Sig: Take 1 tablet (100 mg total) by mouth 2 (two) times daily.    Dispense:  30 tablet    Refill:  0    Patient Instructions  Take antibiotic as directed Do not take the Naprosyn Return to clinic for lab work We will call you with the results of the ankle x-rays once those are available Return to clinic in 2 weeks to review the ankle pain and the nail infection and lab work appeared   take extra strength Tylenol as needed for pain Erin Capes MD

## 2014-01-15 NOTE — Patient Instructions (Signed)
Take antibiotic as directed Do not take the Naprosyn Return to clinic for lab work We will call you with the results of the ankle x-rays once those are available Return to clinic in 2 weeks to review the ankle pain and the nail infection and lab work appeared

## 2014-01-17 ENCOUNTER — Other Ambulatory Visit: Payer: Self-pay | Admitting: Family Medicine

## 2014-01-17 ENCOUNTER — Other Ambulatory Visit (INDEPENDENT_AMBULATORY_CARE_PROVIDER_SITE_OTHER): Payer: Medicare Other

## 2014-01-17 DIAGNOSIS — E119 Type 2 diabetes mellitus without complications: Secondary | ICD-10-CM

## 2014-01-17 DIAGNOSIS — E559 Vitamin D deficiency, unspecified: Secondary | ICD-10-CM

## 2014-01-17 DIAGNOSIS — I10 Essential (primary) hypertension: Secondary | ICD-10-CM

## 2014-01-17 DIAGNOSIS — R0602 Shortness of breath: Secondary | ICD-10-CM

## 2014-01-17 DIAGNOSIS — M25571 Pain in right ankle and joints of right foot: Secondary | ICD-10-CM

## 2014-01-17 DIAGNOSIS — IMO0002 Reserved for concepts with insufficient information to code with codable children: Secondary | ICD-10-CM

## 2014-01-17 DIAGNOSIS — M25579 Pain in unspecified ankle and joints of unspecified foot: Secondary | ICD-10-CM

## 2014-01-17 DIAGNOSIS — Z9884 Bariatric surgery status: Secondary | ICD-10-CM

## 2014-01-17 LAB — POCT CBC
Granulocyte percent: 81.1 %G — AB (ref 37–80)
HCT, POC: 30.8 % — AB (ref 37.7–47.9)
Hemoglobin: 10 g/dL — AB (ref 12.2–16.2)
Lymph, poc: 1.8 (ref 0.6–3.4)
MCH, POC: 24.8 pg — AB (ref 27–31.2)
MCHC: 32.4 g/dL (ref 31.8–35.4)
MCV: 76.7 fL — AB (ref 80–97)
MPV: 7.5 fL (ref 0–99.8)
POC Granulocyte: 9.3 — AB (ref 2–6.9)
POC LYMPH PERCENT: 15.6 %L (ref 10–50)
Platelet Count, POC: 285 10*3/uL (ref 142–424)
RBC: 4 M/uL — AB (ref 4.04–5.48)
RDW, POC: 17.7 %
WBC: 11.5 10*3/uL — AB (ref 4.6–10.2)

## 2014-01-17 LAB — POCT GLYCOSYLATED HEMOGLOBIN (HGB A1C): Hemoglobin A1C: 5.4

## 2014-01-17 NOTE — Progress Notes (Signed)
Pt came in for labs only 

## 2014-01-19 LAB — BMP8+EGFR
BUN/Creatinine Ratio: 7 — ABNORMAL LOW (ref 11–26)
BUN: 8 mg/dL (ref 8–27)
CO2: 20 mmol/L (ref 18–29)
Calcium: 9.2 mg/dL (ref 8.7–10.3)
Chloride: 104 mmol/L (ref 97–108)
Creatinine, Ser: 1.08 mg/dL — ABNORMAL HIGH (ref 0.57–1.00)
GFR calc Af Amer: 64 mL/min/{1.73_m2} (ref >59)
GFR calc non Af Amer: 56 mL/min/{1.73_m2} — ABNORMAL LOW (ref >59)
Glucose: 90 mg/dL (ref 65–99)
Potassium: 5.2 mmol/L (ref 3.5–5.2)
Sodium: 142 mmol/L (ref 134–144)

## 2014-01-19 LAB — HEPATIC FUNCTION PANEL
ALT: 10 IU/L (ref 0–32)
AST: 15 IU/L (ref 0–40)
Albumin: 4.1 g/dL (ref 3.6–4.8)
Alkaline Phosphatase: 170 IU/L — ABNORMAL HIGH (ref 39–117)
Bilirubin, Direct: 0.05 mg/dL (ref 0.00–0.40)
Total Bilirubin: <0.2 mg/dL (ref 0.0–1.2)
Total Protein: 6.1 g/dL (ref 6.0–8.5)

## 2014-01-19 LAB — NMR, LIPOPROFILE
Cholesterol: 176 mg/dL (ref <200)
HDL Cholesterol by NMR: 85 mg/dL (ref >=40)
HDL Particle Number: 41.8 umol/L (ref >=30.5)
LDL Particle Number: 838 nmol/L (ref <1000)
LDL Size: 21.4 nm (ref >20.5)
LDLC SERPL CALC-MCNC: 78 mg/dL (ref <100)
LP-IR Score: 30 (ref <=45)
Small LDL Particle Number: 226 nmol/L (ref <=527)
Triglycerides by NMR: 66 mg/dL (ref <150)

## 2014-01-19 LAB — THYROID PANEL WITH TSH
Free Thyroxine Index: 0.4 — ABNORMAL LOW (ref 1.2–4.9)
T3 Uptake Ratio: 29 % (ref 24–39)
T4, Total: 1.4 ug/dL — CL (ref 4.5–12.0)
TSH: 1.78 u[IU]/mL (ref 0.450–4.500)

## 2014-01-19 LAB — VITAMIN D 25 HYDROXY (VIT D DEFICIENCY, FRACTURES): Vit D, 25-Hydroxy: 4.2 ng/mL — ABNORMAL LOW (ref 30.0–100.0)

## 2014-01-29 ENCOUNTER — Ambulatory Visit (INDEPENDENT_AMBULATORY_CARE_PROVIDER_SITE_OTHER): Payer: Medicare Other | Admitting: Family Medicine

## 2014-01-29 ENCOUNTER — Encounter: Payer: Self-pay | Admitting: Family Medicine

## 2014-01-29 VITALS — BP 113/75 | HR 99 | Temp 98.9°F | Ht 66.0 in | Wt 181.0 lb

## 2014-01-29 DIAGNOSIS — D649 Anemia, unspecified: Secondary | ICD-10-CM

## 2014-01-29 DIAGNOSIS — E785 Hyperlipidemia, unspecified: Secondary | ICD-10-CM

## 2014-01-29 DIAGNOSIS — E039 Hypothyroidism, unspecified: Secondary | ICD-10-CM

## 2014-01-29 DIAGNOSIS — Z780 Asymptomatic menopausal state: Secondary | ICD-10-CM

## 2014-01-29 DIAGNOSIS — K219 Gastro-esophageal reflux disease without esophagitis: Secondary | ICD-10-CM

## 2014-01-29 DIAGNOSIS — E559 Vitamin D deficiency, unspecified: Secondary | ICD-10-CM

## 2014-01-29 DIAGNOSIS — F411 Generalized anxiety disorder: Secondary | ICD-10-CM

## 2014-01-29 DIAGNOSIS — E119 Type 2 diabetes mellitus without complications: Secondary | ICD-10-CM

## 2014-01-29 DIAGNOSIS — M542 Cervicalgia: Secondary | ICD-10-CM

## 2014-01-29 DIAGNOSIS — F319 Bipolar disorder, unspecified: Secondary | ICD-10-CM

## 2014-01-29 DIAGNOSIS — I10 Essential (primary) hypertension: Secondary | ICD-10-CM

## 2014-01-29 MED ORDER — CYCLOBENZAPRINE HCL 10 MG PO TABS: ORAL_TABLET | ORAL | Status: DC

## 2014-01-29 NOTE — Patient Instructions (Addendum)
Continue current medications. Continue good therapeutic lifestyle changes which include good diet and exercise. Fall precautions discussed with patient. If an FOBT was given today- please return it to our front desk. If you are over 61 years old - you may need Prevnar 13 or the adult Pneumonia vaccine.  Use warm wet compresses 20 minutes 3-4 times daily to posterior neck Take a muscle relaxer as needed once or twice a day We will call you with the results of the lab work once those results are available If the hemoglobin continues to run low or low we may need to go back and visit with the hematologist again. Return to clinic in 4 weeks and repeat the thyroid profile

## 2014-01-29 NOTE — Progress Notes (Signed)
Subjective:    Patient ID: Erin Donaldson, female    DOB: November 25, 1950, 63 y.o.   MRN: [id number]  HPI Pt here for follow up and management of chronic medical problems. The patient complains of neck and bilateral shoulder pain. She has a history of a fracture which when she was checked on the last visit appear to be healing according to the radiologist. This was a distal fibular fracture that was read by the radiologist as healing        Patient Active Problem List   Diagnosis Date Noted  . History of gastric bypass 01/15/2014  . Amnestic disorder due to medical condition 08/23/2013  . Diarrhea 07/19/2013  . BIPOLAR AFFECTIVE DISORDER 08/26/2009  . ANXIETY 08/26/2009  . MIGRAINE HEADACHE 08/26/2009  . DIABETES MELLITUS, TYPE II 08/15/2009  . HYPERLIPIDEMIA 08/15/2009  . HYPERTENSION 08/15/2009  . GERD 08/15/2009  . CONSTIPATION 08/15/2009  . IRRITABLE BOWEL SYNDROME 08/15/2009   Outpatient Encounter Prescriptions as of 01/29/2014  Medication Sig  . donepezil (ARICEPT) 10 MG tablet TAKE 1 TABLET (10 MG TOTAL) BY MOUTH DAILY.  Marland Kitchen. doxycycline (VIBRA-TABS) 100 MG tablet Take 1 tablet (100 mg total) by mouth 2 (two) times daily.  . fluticasone (FLONASE) 50 MCG/ACT nasal spray Place 1 spray into the nose daily. One spray in each nostril daily.  . furosemide (LASIX) 20 MG tablet Take 1 tablet (20 mg total) by mouth daily.  Marland Kitchen. gabapentin (NEURONTIN) 300 MG capsule TAKE 3 CAPSULE BY MOUTH THREE TIMES A DAY  . lamoTRIgine (LAMICTAL) 200 MG tablet Take 200 mg by mouth daily.  Marland Kitchen. nystatin (MYCOSTATIN) 100000 UNIT/ML suspension Take 5 mLs (500,000 Units total) by mouth 4 (four) times daily as needed.  Marland Kitchen. omeprazole (PRILOSEC) 40 MG capsule TAKE 2 CAPSULES (80 MG TOTAL) BY MOUTH DAILY.  Marland Kitchen. QUEtiapine (SEROQUEL) 200 MG tablet Take 200 mg by mouth daily.  . QUEtiapine (SEROQUEL) 400 MG tablet Take 800 mg by mouth at bedtime.   . rizatriptan (MAXALT) 10 MG tablet Take 1 tablet (10 mg total) by  mouth as needed. May repeat in 2 hours if needed  . [DISCONTINUED] HYDROcodone-acetaminophen (NORCO/VICODIN) 5-325 MG per tablet Take 1 tablet by mouth every 6 (six) hours as needed for moderate pain.  . [DISCONTINUED] allopurinol (ZYLOPRIM) 100 MG tablet Take 1 tablet (100 mg total) by mouth daily.  . [DISCONTINUED] Colchicine 0.6 MG CAPS Take 0.6 mg by mouth once.  . [DISCONTINUED] meloxicam (MOBIC) 7.5 MG tablet Take 2 pills initially, then take 3rd pill 1 hour later.  . [DISCONTINUED] naproxen (NAPROSYN) 500 MG tablet TAKE 1 TABLET (500 MG TOTAL) BY MOUTH 2 (TWO) TIMES DAILY WITH A MEAL.  . [DISCONTINUED] nystatin cream (MYCOSTATIN) Apply 1 application topically 2 (two) times daily.  . [DISCONTINUED] propranolol ER (INDERAL LA) 60 MG 24 hr capsule Take 1 capsule (60 mg total) by mouth daily.  . [DISCONTINUED] torsemide (DEMADEX) 20 MG tablet TAKE 1 TABLET BY MOUTH ONCE DAILY    Review of Systems  Constitutional: Negative.   HENT: Negative.   Eyes: Negative.   Respiratory: Negative.   Cardiovascular: Negative.   Gastrointestinal: Negative.   Endocrine: Negative.   Genitourinary: Negative.   Musculoskeletal: Positive for myalgias (pain from neck to shoulders). Arthralgias: old fracture- healed right ankle.  Skin: Negative.        Left thumb - rck infections  Allergic/Immunologic: Negative.   Neurological: Negative.   Hematological: Negative.   Psychiatric/Behavioral: Negative.  Objective:   Physical Exam  Nursing note and vitals reviewed. Constitutional: She is oriented to person, place, and time. She appears well-developed and well-nourished. No distress.  Pleasant And walking is within normal limit  HENT:  Head: Atraumatic.  Right Ear: External ear normal.  Left Ear: External ear normal.  Nose: Nose normal.  Mouth/Throat: Oropharynx is clear and moist. No oropharyngeal exudate.  Eyes: Conjunctivae and EOM are normal. Pupils are equal, round, and reactive to light.  Right eye exhibits no discharge. Left eye exhibits no discharge. No scleral icterus.  Neck: Normal range of motion. Neck supple. No thyromegaly present.  Cardiovascular: Normal rate, regular rhythm, normal heart sounds and intact distal pulses.  Exam reveals no gallop and no friction rub.   No murmur heard. At 96 per minute  Pulmonary/Chest: Effort normal and breath sounds normal. No respiratory distress. She has no wheezes. She has no rales. She exhibits no tenderness.  Abdominal: Soft. Bowel sounds are normal. She exhibits no mass. There is tenderness (epigastric tenderness with palpation). There is no rebound and no guarding.  History of gastric bypass  Musculoskeletal: Normal range of motion. She exhibits tenderness. She exhibits no edema.  Soreness in muscles of neck bilaterally  Lymphadenopathy:    She has no cervical adenopathy.  Neurological: She is alert and oriented to person, place, and time. She has normal reflexes. No cranial nerve deficit.  Skin: Skin is warm and dry.  Psychiatric: She has a normal mood and affect. Her behavior is normal. Judgment and thought content normal.  Affect somewhat flat   BP 113/75  Pulse 99  Temp(Src) 98.9 F (37.2 C) (Oral)  Ht 5\' 6"  (1.676 m)  Wt 181 lb (82.101 kg)  BMI 29.23 kg/m2        Assessment & Plan:  1. DIABETES MELLITUS, TYPE II  2. ANXIETY  3. BIPOLAR AFFECTIVE DISORDER  4. GERD -Continue omeprazole  5. HYPERLIPIDEMIA -Continue aggressive diet and exercise regimen  6. HYPERTENSION  7. Postmenopausal - DG Bone Density; Future  8. Vitamin D deficiency - Vit D  25 hydroxy (rtn osteoporosis monitoring)  9. Anemia - Anemia Profile B  10. Thyroid activity decreased - Thyroid Panel With TSH  11. Neck pain, bilateral -Flexeril 10 mg one half to one twice daily -Warm wet compresses as directed  Meds ordered this encounter  Medications  . cyclobenzaprine (FLEXERIL) 10 MG tablet    Sig: 1/2 to 1 tab twice a day  as directed    Dispense:  20 tablet    Refill:  0   Patient Instructions  Continue current medications. Continue good therapeutic lifestyle changes which include good diet and exercise. Fall precautions discussed with patient. If an FOBT was given today- please return it to our front desk. If you are over 9 years old - you may need Prevnar 13 or the adult Pneumonia vaccine.  Use warm wet compresses 20 minutes 3-4 times daily to posterior neck Take a muscle relaxer as needed once or twice a day We will call you with the results of the lab work once those results are available If the hemoglobin continues to run low or low we may need to go back and visit with the hematologist again. Return to clinic in 4 weeks and repeat the thyroid profile   Nyra Capes MD

## 2014-01-30 ENCOUNTER — Other Ambulatory Visit: Payer: Self-pay | Admitting: *Deleted

## 2014-01-30 ENCOUNTER — Telehealth: Payer: Self-pay | Admitting: Family Medicine

## 2014-01-30 LAB — ANEMIA PROFILE B
Basophils Absolute: 0 10*3/uL (ref 0.0–0.2)
Basos: 0 %
Eos: 1 %
Eosinophils Absolute: 0.1 10*3/uL (ref 0.0–0.4)
Ferritin: 11 ng/mL — ABNORMAL LOW (ref 15–150)
Folate: 5.4 ng/mL (ref >3.0)
HCT: 36 % (ref 34.0–46.6)
Hemoglobin: 11.2 g/dL (ref 11.1–15.9)
Immature Grans (Abs): 0 10*3/uL (ref 0.0–0.1)
Immature Granulocytes: 0 %
Iron Saturation: 12 % — ABNORMAL LOW (ref 15–55)
Iron: 36 ug/dL (ref 35–155)
Lymphocytes Absolute: 1.4 10*3/uL (ref 0.7–3.1)
Lymphs: 11 %
MCH: 24.2 pg — ABNORMAL LOW (ref 26.6–33.0)
MCHC: 31.1 g/dL — ABNORMAL LOW (ref 31.5–35.7)
MCV: 78 fL — ABNORMAL LOW (ref 79–97)
Monocytes Absolute: 0.9 10*3/uL (ref 0.1–0.9)
Monocytes: 7 %
Neutrophils Absolute: 10.5 10*3/uL — ABNORMAL HIGH (ref 1.4–7.0)
Neutrophils Relative %: 81 %
Platelets: 351 10*3/uL (ref 150–379)
RBC: 4.62 x10E6/uL (ref 3.77–5.28)
RDW: 17.4 % — ABNORMAL HIGH (ref 12.3–15.4)
Retic Ct Pct: 1 % (ref 0.6–2.6)
TIBC: 312 ug/dL (ref 250–450)
UIBC: 276 ug/dL (ref 150–375)
Vitamin B-12: 424 pg/mL (ref 211–946)
WBC: 12.9 10*3/uL — ABNORMAL HIGH (ref 3.4–10.8)

## 2014-01-30 LAB — THYROID PANEL WITH TSH
Free Thyroxine Index: 0.2 — ABNORMAL LOW (ref 1.2–4.9)
T3 Uptake Ratio: 37 % (ref 24–39)
T4, Total: 0.6 ug/dL — CL (ref 4.5–12.0)
TSH: 1.49 u[IU]/mL (ref 0.450–4.500)

## 2014-01-30 LAB — VITAMIN D 25 HYDROXY (VIT D DEFICIENCY, FRACTURES): Vit D, 25-Hydroxy: 4.7 ng/mL — ABNORMAL LOW (ref 30.0–100.0)

## 2014-01-30 MED ORDER — MECLIZINE HCL 12.5 MG PO TABS: ORAL_TABLET | ORAL | Status: DC

## 2014-01-30 NOTE — Telephone Encounter (Signed)
No fever. Pain in mid abdomen around naval area and upward. Stated stomach was swollen. Will try Mirilax and wants Meclizine called in to [facility]. Dizziness is with head movement and getting up and down.

## 2014-01-30 NOTE — Telephone Encounter (Signed)
Please make sure that she is not running a fever Find out where the pain is located  Try MiraLax for the constipation Please see if the dizziness is worse with head movement or getting up and getting down. If that is the case, call in meclizine 12.5 mg 3 times daily with food    #21

## 2014-01-30 NOTE — Telephone Encounter (Signed)
Woke up with dizziness this morning, still having abd pain, no BM X 4 days, no. Taking laxative not helping. Please advise

## 2014-02-02 ENCOUNTER — Telehealth: Payer: Self-pay | Admitting: Family Medicine

## 2014-02-02 NOTE — Telephone Encounter (Signed)
Pt aware of lab results.  Has question about taking iron.  She take prilosec and was told by hematologist prilosec will interfer with iron.  What is she to do??  Call her at [phone].  rs

## 2014-02-02 NOTE — Telephone Encounter (Signed)
Message copied by Azalee CourseFULP, Marsella Suman on Fri Feb 02, 2014 11:42 AM ------      Message from: Ernestina PennaMOORE, DONALD W      Created: Tue Jan 30, 2014  9:17 AM       The repeat vitamin D level remains very low.      Please start vitamin D 50,000 units weekly for 12 weeks with one refill. Please repeat vitamin D level in 12 weeks      Iron saturation is low. Ferritin level is low. The hemoglobin is 11.2.------ please make sure that patient is taken iron regularly. You can call and a prescription for iron, Integra plus, 1 daily. She should take this regularly until her next visit. She should have another CBC done when she gets her vitamin D level done ----3months      The T4 remains low the TSH is normal.----- also repeat thyroid profile in 3 mo             ------

## 2014-02-02 NOTE — Telephone Encounter (Signed)
Take one in the morning and one at night

## 2014-02-02 NOTE — Telephone Encounter (Signed)
Is this for the Prilosec or the iron?

## 2014-02-02 NOTE — Telephone Encounter (Signed)
Take prilosec in am and iron in pm

## 2014-02-05 NOTE — Telephone Encounter (Signed)
Patient aware.

## 2014-03-21 ENCOUNTER — Other Ambulatory Visit: Payer: Medicare Other

## 2014-03-21 ENCOUNTER — Ambulatory Visit: Payer: Medicare Other

## 2014-04-17 ENCOUNTER — Encounter: Payer: Self-pay | Admitting: *Deleted

## 2014-05-11 ENCOUNTER — Other Ambulatory Visit: Payer: Self-pay | Admitting: Family Medicine

## 2014-06-04 ENCOUNTER — Ambulatory Visit: Payer: Medicare Other | Admitting: Family Medicine

## 2014-06-08 ENCOUNTER — Ambulatory Visit (INDEPENDENT_AMBULATORY_CARE_PROVIDER_SITE_OTHER): Payer: Medicare Other | Admitting: Family Medicine

## 2014-06-08 ENCOUNTER — Encounter: Payer: Self-pay | Admitting: Family Medicine

## 2014-06-08 VITALS — BP 123/87 | HR 98 | Temp 99.6°F | Ht 66.0 in | Wt 172.0 lb

## 2014-06-08 DIAGNOSIS — E119 Type 2 diabetes mellitus without complications: Secondary | ICD-10-CM

## 2014-06-08 DIAGNOSIS — F411 Generalized anxiety disorder: Secondary | ICD-10-CM

## 2014-06-08 DIAGNOSIS — R300 Dysuria: Secondary | ICD-10-CM

## 2014-06-08 DIAGNOSIS — E785 Hyperlipidemia, unspecified: Secondary | ICD-10-CM

## 2014-06-08 DIAGNOSIS — G44209 Tension-type headache, unspecified, not intractable: Secondary | ICD-10-CM

## 2014-06-08 DIAGNOSIS — I10 Essential (primary) hypertension: Secondary | ICD-10-CM

## 2014-06-08 DIAGNOSIS — E559 Vitamin D deficiency, unspecified: Secondary | ICD-10-CM

## 2014-06-08 DIAGNOSIS — K219 Gastro-esophageal reflux disease without esophagitis: Secondary | ICD-10-CM

## 2014-06-08 LAB — POCT URINALYSIS DIPSTICK
Bilirubin, UA: NEGATIVE
Blood, UA: NEGATIVE
Glucose, UA: NEGATIVE
Ketones, UA: NEGATIVE
Nitrite, UA: POSITIVE
Protein, UA: NEGATIVE
Spec Grav, UA: 1.01
Urobilinogen, UA: NEGATIVE
pH, UA: 5

## 2014-06-08 LAB — POCT UA - MICROSCOPIC ONLY
Casts, Ur, LPF, POC: NEGATIVE
Crystals, Ur, HPF, POC: NEGATIVE
Mucus, UA: NEGATIVE
RBC, urine, microscopic: NEGATIVE
Yeast, UA: NEGATIVE

## 2014-06-08 LAB — POCT UA - MICROALBUMIN: Microalbumin Ur, POC: NEGATIVE mg/L

## 2014-06-08 MED ORDER — METHOCARBAMOL 750 MG PO TABS: 750.0000 mg | ORAL_TABLET | Freq: Four times a day (QID) | ORAL | Status: DC

## 2014-06-08 MED ORDER — CIPROFLOXACIN HCL 250 MG PO TABS: 250.0000 mg | ORAL_TABLET | Freq: Two times a day (BID) | ORAL | Status: DC

## 2014-06-08 NOTE — Patient Instructions (Addendum)
Continue current medications. Continue good therapeutic lifestyle changes which include good diet and exercise. Fall precautions discussed with patient. If an FOBT was given today- please return it to our front desk. If you are over 61 years old - you may need Prevnar 13 or the adult Pneumonia vaccine.  Take antibiotic as directed Recheck urinalysis in 10-14 day Drink plenty of fluid Try to exercise regularly We will call you with the lab work soon as those results are available, patient is to check the cost of the tetanus shot and the Prevnar vaccine

## 2014-06-08 NOTE — Progress Notes (Signed)
Subjective:    Patient ID: Erin Donaldson, female    DOB: [birth year], 63 y.o.   MRN: [id number]  HPI Pt here for follow up and management of chronic medical problems. The patient does complain of some urinary urgency and frequency and also of some headaches. The patient is dealing with a lot of family stress at home with her daughter and grandchildren.        Patient Active Problem List   Diagnosis Date Noted  . History of gastric bypass 01/15/2014  . Amnestic disorder due to medical condition 08/23/2013  . Diarrhea 07/19/2013  . BIPOLAR AFFECTIVE DISORDER 08/26/2009  . ANXIETY 08/26/2009  . MIGRAINE HEADACHE 08/26/2009  . DIABETES MELLITUS, TYPE II 08/15/2009  . HYPERLIPIDEMIA 08/15/2009  . HYPERTENSION 08/15/2009  . GERD 08/15/2009  . CONSTIPATION 08/15/2009  . IRRITABLE BOWEL SYNDROME 08/15/2009   Outpatient Encounter Prescriptions as of 06/08/2014  Medication Sig  . cyclobenzaprine (FLEXERIL) 10 MG tablet 1/2 to 1 tab twice a day as directed  . donepezil (ARICEPT) 10 MG tablet TAKE 1 TABLET (10 MG TOTAL) BY MOUTH DAILY.  . fluticasone (FLONASE) 50 MCG/ACT nasal spray Place 1 spray into the nose daily. One spray in each nostril daily.  . furosemide (LASIX) 20 MG tablet Take 1 tablet (20 mg total) by mouth daily.  Marland Kitchen gabapentin (NEURONTIN) 300 MG capsule TAKE 3 CAPSULE BY MOUTH THREE TIMES A DAY  . lamoTRIgine (LAMICTAL) 200 MG tablet Take 200 mg by mouth daily.  . meclizine (ANTIVERT) 12.5 MG tablet Take three times daily with food as needed  . omeprazole (PRILOSEC) 40 MG capsule TAKE 2 CAPSULES (80 MG TOTAL) BY MOUTH DAILY.  Marland Kitchen QUEtiapine (SEROQUEL) 200 MG tablet Take 200 mg by mouth daily.  . QUEtiapine (SEROQUEL) 400 MG tablet Take 800 mg by mouth at bedtime.   . rizatriptan (MAXALT) 10 MG tablet Take 1 tablet (10 mg total) by mouth as needed. May repeat in 2 hours if needed  . [DISCONTINUED] doxycycline (VIBRA-TABS) 100 MG tablet Take 1 tablet (100 mg total) by mouth 2  (two) times daily.  . [DISCONTINUED] nystatin (MYCOSTATIN) 100000 UNIT/ML suspension Take 5 mLs (500,000 Units total) by mouth 4 (four) times daily as needed.    Review of Systems  Constitutional: Negative.   Eyes: Negative.   Respiratory: Negative.   Cardiovascular: Negative.   Gastrointestinal: Negative.   Endocrine: Negative.   Genitourinary: Positive for urgency and frequency.  Musculoskeletal: Negative.   Skin: Negative.   Allergic/Immunologic: Negative.   Neurological: Positive for headaches (tension headaches).  Hematological: Negative.   Psychiatric/Behavioral: Negative.        Objective:   Physical Exam  Nursing note and vitals reviewed. Constitutional: She is oriented to person, place, and time. She appears well-developed and well-nourished. She appears distressed.  HENT:  Head: Normocephalic and atraumatic.  Right Ear: External ear normal.  Left Ear: External ear normal.  Nose: Nose normal.  Mouth/Throat: Oropharynx is clear and moist.  Eyes: Conjunctivae and EOM are normal. Pupils are equal, round, and reactive to light. Right eye exhibits no discharge. Left eye exhibits no discharge. No scleral icterus.  Neck: Normal range of motion. Neck supple. No thyromegaly present.  Cardiovascular: Normal rate, regular rhythm, normal heart sounds and intact distal pulses.   No murmur heard. At 96 per minute  Pulmonary/Chest: Effort normal and breath sounds normal. No respiratory distress. She has no wheezes. She has no rales. She exhibits no tenderness.  Abdominal: Soft. Bowel sounds are  normal. She exhibits no mass. There is no tenderness. There is no rebound and no guarding.  Musculoskeletal: Normal range of motion. She exhibits no edema and no tenderness.  Lymphadenopathy:    She has no cervical adenopathy.  Neurological: She is alert and oriented to person, place, and time. She has normal reflexes. No cranial nerve deficit.  Skin: Skin is warm and dry. No rash noted. No  erythema. No pallor.  Psychiatric: She has a normal mood and affect. Her behavior is normal. Judgment and thought content normal.  Depressed affect   BP 123/87  Pulse 98  Temp(Src) 99.6 F (37.6 C) (Oral)  Ht '5\' 6"'  (1.676 m)  Wt 172 lb (78.019 kg)  BMI 27.77 kg/m2 Results for orders placed in visit on 06/08/14  POCT URINALYSIS DIPSTICK      Result Value Ref Range   Color, UA yellow     Clarity, UA cloudy     Glucose, UA neg     Bilirubin, UA neg     Ketones, UA neg     Spec Grav, UA 1.010     Blood, UA neg     pH, UA 5.0     Protein, UA neg     Urobilinogen, UA negative     Nitrite, UA pos     Leukocytes, UA Trace    POCT UA - MICROSCOPIC ONLY      Result Value Ref Range   WBC, Ur, HPF, POC 10-15     RBC, urine, microscopic neg     Bacteria, U Microscopic many     Mucus, UA neg     Epithelial cells, urine per micros occ     Crystals, Ur, HPF, POC neg     Casts, Ur, LPF, POC neg     Yeast, UA neg    POCT UA - MICROALBUMIN      Result Value Ref Range   Microalbumin Ur, POC neg     It is important to note that this patient is not taking any diabetic medication especially since her gastric bypass several years ago. She does not check blood sugars at home anymore. She does have good pedal pulses. And she's not having any trouble with urinary incontinence. During the exam today she was alert responded appropriately and if anything is going on psychological issues the stress that she is dealing with in her home and with her family and having lost her husband.       Assessment & Plan:  1. ANXIETY - POCT CBC  2. DIABETES MELLITUS, TYPE II - POCT UA - Microalbumin - POCT CBC - POCT glycosylated hemoglobin (Hb A1C)  3. Gastroesophageal reflux disease, esophagitis presence not specified - POCT CBC  4. HYPERLIPIDEMIA - POCT CBC - Lipid panel  5. HYPERTENSION - POCT CBC - BMP8+EGFR - Hepatic function panel  6. Dysuria - POCT urinalysis dipstick - POCT UA -  Microscopic Only - POCT CBC -Cipro 250 twice daily for 7 day -Recheck urinalysis in 10-14 day  7. Vitamin D deficiency - Vit D  25 hydroxy (rtn osteoporosis monitoring)  8. Tension headache - methocarbamol (ROBAXIN-750) 750 MG tablet; Take 1 tablet (750 mg total) by mouth 4 (four) times daily.  Dispense: 40 tablet; Refill: 1  Patient Instructions  Continue current medications. Continue good therapeutic lifestyle changes which include good diet and exercise. Fall precautions discussed with patient. If an FOBT was given today- please return it to our front desk. If you are over 50 years  old - you may need Prevnar 13 or the adult Pneumonia vaccine.  Take antibiotic as directed Recheck urinalysis in 10-14 day Drink plenty of fluid Try to exercise regularly We will call you with the lab work soon as those results are available, patient is to check the cost of the tetanus shot and the Prevnar vaccine    Arrie Senate MD

## 2014-06-13 ENCOUNTER — Telehealth: Payer: Self-pay | Admitting: Family Medicine

## 2014-06-13 DIAGNOSIS — N390 Urinary tract infection, site not specified: Secondary | ICD-10-CM

## 2014-06-13 NOTE — Telephone Encounter (Signed)
Please advise 

## 2014-06-13 NOTE — Telephone Encounter (Signed)
Please check a.c. if there is a urine culture and sensitivity pending from this urinalysis. Have patient come by and give us another urine specimen clean catch midstream

## 2014-06-13 NOTE — Telephone Encounter (Signed)
Pt aware to come in today or tomorrow for UA and culture and that after seeing this - [facility] may change her medications. Labs are ordered

## 2014-06-14 ENCOUNTER — Other Ambulatory Visit (INDEPENDENT_AMBULATORY_CARE_PROVIDER_SITE_OTHER): Payer: Medicare Other

## 2014-06-14 DIAGNOSIS — N390 Urinary tract infection, site not specified: Secondary | ICD-10-CM

## 2014-06-15 ENCOUNTER — Telehealth: Payer: Self-pay | Admitting: Family Medicine

## 2014-06-17 ENCOUNTER — Other Ambulatory Visit: Payer: Self-pay | Admitting: Family Medicine

## 2014-06-18 ENCOUNTER — Other Ambulatory Visit: Payer: Self-pay | Admitting: *Deleted

## 2014-06-18 ENCOUNTER — Telehealth: Payer: Self-pay | Admitting: Family Medicine

## 2014-06-18 LAB — POCT UA - MICROSCOPIC ONLY
Casts, Ur, LPF, POC: NEGATIVE
Crystals, Ur, HPF, POC: NEGATIVE
Mucus, UA: NEGATIVE
RBC, urine, microscopic: NEGATIVE
Yeast, UA: NEGATIVE

## 2014-06-18 LAB — POCT URINALYSIS DIPSTICK
Bilirubin, UA: NEGATIVE
Blood, UA: NEGATIVE
Glucose, UA: NEGATIVE
Ketones, UA: NEGATIVE
Nitrite, UA: NEGATIVE
Protein, UA: NEGATIVE
Spec Grav, UA: 1.01
Urobilinogen, UA: NEGATIVE
pH, UA: 5

## 2014-06-18 MED ORDER — SULFAMETHOXAZOLE-TMP DS 800-160 MG PO TABS: 1.0000 | ORAL_TABLET | Freq: Two times a day (BID) | ORAL | Status: DC

## 2014-06-18 NOTE — Telephone Encounter (Signed)
Tried to return patient's call but was unable to speak with her. Original note states that she is still in pain. We do not have the urine culture results back yet. Do you have any suggestions?

## 2014-06-18 NOTE — Addendum Note (Signed)
Addended by: Tommas OlpHANDY, ASHLEY N on: 06/18/2014 02:19 PM   Modules accepted: Orders

## 2014-06-18 NOTE — Telephone Encounter (Signed)
I spoke with Erin Donaldson and she has called in a prescription for Septra DS for a 7 day treatment Please make sure this has been done

## 2014-06-18 NOTE — Telephone Encounter (Signed)
Per lab they have called patient and she is coming in today to leave another urine

## 2014-06-18 NOTE — Addendum Note (Signed)
Addended by: Tommas OlpHANDY, ASHLEY N on: 06/18/2014 02:17 PM   Modules accepted: Orders

## 2014-06-19 NOTE — Telephone Encounter (Signed)
lmtcb ac 8/4 

## 2014-06-19 NOTE — Telephone Encounter (Signed)
PAtient aware 

## 2014-06-21 LAB — URINE CULTURE

## 2014-06-25 ENCOUNTER — Other Ambulatory Visit: Payer: Self-pay | Admitting: *Deleted

## 2014-06-25 DIAGNOSIS — N390 Urinary tract infection, site not specified: Secondary | ICD-10-CM

## 2014-07-13 ENCOUNTER — Encounter: Payer: Self-pay | Admitting: Internal Medicine

## 2014-08-12 ENCOUNTER — Other Ambulatory Visit: Payer: Self-pay | Admitting: Family Medicine

## 2014-08-19 ENCOUNTER — Other Ambulatory Visit: Payer: Self-pay | Admitting: Family Medicine

## 2014-08-23 ENCOUNTER — Ambulatory Visit: Payer: Medicare Other | Admitting: Neurology

## 2014-08-29 ENCOUNTER — Other Ambulatory Visit: Payer: Self-pay | Admitting: Family Medicine

## 2014-09-07 ENCOUNTER — Other Ambulatory Visit: Payer: Self-pay

## 2014-09-07 ENCOUNTER — Other Ambulatory Visit: Payer: Self-pay | Admitting: Family Medicine

## 2014-09-07 MED ORDER — DONEPEZIL HCL 10 MG PO TABS: ORAL_TABLET | ORAL | Status: DC

## 2014-09-13 ENCOUNTER — Other Ambulatory Visit: Payer: Self-pay | Admitting: Family Medicine

## 2014-09-20 ENCOUNTER — Other Ambulatory Visit: Payer: Self-pay | Admitting: Family Medicine

## 2014-10-03 ENCOUNTER — Ambulatory Visit (INDEPENDENT_AMBULATORY_CARE_PROVIDER_SITE_OTHER): Payer: Medicare Other | Admitting: Family Medicine

## 2014-10-03 ENCOUNTER — Encounter: Payer: Self-pay | Admitting: Family Medicine

## 2014-10-03 VITALS — BP 124/85 | HR 98 | Temp 98.1°F | Ht 66.0 in | Wt 167.0 lb

## 2014-10-03 DIAGNOSIS — N3289 Other specified disorders of bladder: Secondary | ICD-10-CM

## 2014-10-03 DIAGNOSIS — B351 Tinea unguium: Secondary | ICD-10-CM

## 2014-10-03 DIAGNOSIS — K219 Gastro-esophageal reflux disease without esophagitis: Secondary | ICD-10-CM

## 2014-10-03 DIAGNOSIS — E118 Type 2 diabetes mellitus with unspecified complications: Secondary | ICD-10-CM

## 2014-10-03 DIAGNOSIS — E559 Vitamin D deficiency, unspecified: Secondary | ICD-10-CM

## 2014-10-03 DIAGNOSIS — N390 Urinary tract infection, site not specified: Secondary | ICD-10-CM

## 2014-10-03 DIAGNOSIS — R3989 Other symptoms and signs involving the genitourinary system: Secondary | ICD-10-CM

## 2014-10-03 LAB — POCT URINALYSIS DIPSTICK
Bilirubin, UA: NEGATIVE
Glucose, UA: NEGATIVE
Ketones, UA: NEGATIVE
Nitrite, UA: NEGATIVE
Protein, UA: NEGATIVE
Spec Grav, UA: 1.01
Urobilinogen, UA: NEGATIVE
pH, UA: 5

## 2014-10-03 LAB — POCT UA - MICROSCOPIC ONLY
Casts, Ur, LPF, POC: NEGATIVE
Crystals, Ur, HPF, POC: NEGATIVE
Yeast, UA: NEGATIVE

## 2014-10-03 MED ORDER — SULFAMETHOXAZOLE-TRIMETHOPRIM 800-160 MG PO TABS: 1.0000 | ORAL_TABLET | Freq: Two times a day (BID) | ORAL | Status: DC

## 2014-10-03 MED ORDER — FUROSEMIDE 20 MG PO TABS: ORAL_TABLET | ORAL | Status: DC

## 2014-10-03 NOTE — Patient Instructions (Addendum)
Continue current medications. Continue good therapeutic lifestyle changes which include good diet and exercise. Fall precautions discussed with patient. If an FOBT was given today- please return it to our front desk. If you are over 61 years old - you may need Prevnar 13 or the adult Pneumonia vaccine.  Flu Shots will be available at our office starting mid- September. Please call and schedule a FLU CLINIC APPOINTMENT.   Drink plenty of fluids Take antibiotic as directed Recheck urinalysis several days after completing the antibiotic We will call you with the results of the urine culture and sensitivity when it becomes available We will make an appointment for you to see the dermatologist Please discuss with your psychiatrist the low blood pressure from the Seroquel

## 2014-10-03 NOTE — Progress Notes (Signed)
Subjective:    Patient ID: Erin Donaldson, female    DOB: 24-Oct-1951, 63 y.o.   MRN: [id number]  HPI Pt here for follow up and management of chronic medical problems. The patient complains of low blood pressure especially in the mornings.she also complains of suprapubic pressure. She also complains of nail fungus involving the nail of the left thumb. The patient has an FOBT at home which she promises to return. She will also come back for fasting lab work. The patient looks great and has been doing well.        Patient Active Problem List   Diagnosis Date Noted  . History of gastric bypass 01/15/2014  . Amnestic disorder due to medical condition 08/23/2013  . Diarrhea 07/19/2013  . BIPOLAR AFFECTIVE DISORDER 08/26/2009  . ANXIETY 08/26/2009  . MIGRAINE HEADACHE 08/26/2009  . DIABETES MELLITUS, TYPE II 08/15/2009  . HYPERLIPIDEMIA 08/15/2009  . HYPERTENSION 08/15/2009  . GERD 08/15/2009  . CONSTIPATION 08/15/2009  . IRRITABLE BOWEL SYNDROME 08/15/2009   Outpatient Encounter Prescriptions as of 10/03/2014  Medication Sig  . cyclobenzaprine (FLEXERIL) 10 MG tablet 1/2 to 1 tab twice a day as directed  . donepezil (ARICEPT) 10 MG tablet TAKE 1 TABLET (10 MG TOTAL) BY MOUTH DAILY.  . fluticasone (FLONASE) 50 MCG/ACT nasal spray Place 1 spray into the nose daily. One spray in each nostril daily.  . furosemide (LASIX) 20 MG tablet TAKE 1 TABLET (20 MG TOTAL) BY MOUTH DAILY.  Marland Kitchen. gabapentin (NEURONTIN) 300 MG capsule TAKE 3 CAPSULE BY MOUTH THREE TIMES A DAY  . lamoTRIgine (LAMICTAL) 200 MG tablet Take 200 mg by mouth daily.  Marland Kitchen. omeprazole (PRILOSEC) 40 MG capsule TAKE 2 CAPSULES (80 MG TOTAL) BY MOUTH DAILY.  Marland Kitchen. QUEtiapine (SEROQUEL) 200 MG tablet Take 200 mg by mouth daily.  . QUEtiapine (SEROQUEL) 400 MG tablet Take 800 mg by mouth at bedtime.   . rizatriptan (MAXALT) 10 MG tablet Take 1 tablet (10 mg total) by mouth as needed. May repeat in 2 hours if needed  . [DISCONTINUED]  ciprofloxacin (CIPRO) 250 MG tablet Take 1 tablet (250 mg total) by mouth 2 (two) times daily.  . [DISCONTINUED] meclizine (ANTIVERT) 12.5 MG tablet Take three times daily with food as needed  . [DISCONTINUED] methocarbamol (ROBAXIN-750) 750 MG tablet Take 1 tablet (750 mg total) by mouth 4 (four) times daily.  . [DISCONTINUED] sulfamethoxazole-trimethoprim (BACTRIM DS) 800-160 MG per tablet Take 1 tablet by mouth 2 (two) times daily.  . [DISCONTINUED] furosemide (LASIX) 20 MG tablet TAKE 1 TABLET (20 MG TOTAL) BY MOUTH DAILY.    Review of Systems  Constitutional: Negative.   HENT: Negative.   Eyes: Negative.   Respiratory: Negative.   Cardiovascular: Negative.        Low BP = early mornings  Gastrointestinal: Negative.   Endocrine: Negative.   Genitourinary: Negative.        Pressure of bladder  Musculoskeletal: Negative.   Skin: Negative.        Left thumb- nail fungus  Allergic/Immunologic: Negative.   Neurological: Negative.   Hematological: Negative.   Psychiatric/Behavioral: Negative.        Objective:   Physical Exam  Constitutional: She is oriented to person, place, and time. She appears well-developed and well-nourished. No distress.  HENT:  Head: Normocephalic and atraumatic.  Right Ear: External ear normal.  Left Ear: External ear normal.  Nose: Nose normal.  Mouth/Throat: Oropharynx is clear and moist.  Eyes: Conjunctivae and EOM are  normal. Pupils are equal, round, and reactive to light. Right eye exhibits no discharge. Left eye exhibits no discharge. No scleral icterus.  Neck: Normal range of motion. Neck supple. No thyromegaly present.  No carotid bruits or anterior cervical adenopathy  Cardiovascular: Normal rate, regular rhythm, normal heart sounds and intact distal pulses.  Exam reveals no gallop and no friction rub.   No murmur heard. At 96/m  Pulmonary/Chest: Effort normal and breath sounds normal. No respiratory distress. She has no wheezes. She has no  rales. She exhibits no tenderness.  Abdominal: Soft. Bowel sounds are normal. She exhibits no mass. There is no tenderness. There is no rebound and no guarding.  Musculoskeletal: Normal range of motion. She exhibits no edema.  Lymphadenopathy:    She has no cervical adenopathy.  Neurological: She is alert and oriented to person, place, and time. She has normal reflexes. No cranial nerve deficit.  Skin: Skin is warm and dry. No rash noted.  Psychiatric: She has a normal mood and affect. Her behavior is normal. Judgment and thought content normal.  Nursing note and vitals reviewed.  BP 124/85 mmHg  Pulse 98  Temp(Src) 98.1 F (36.7 C) (Oral)  Ht 5\' 6"  (1.676 m)  Wt 167 lb (75.751 kg)  BMI 26.97 kg/m2        Assessment & Plan:  1. Gastroesophageal reflux disease, esophagitis presence not specified  2. Vitamin D deficiency  3. Type 2 diabetes mellitus with complication  4. Sensation of pressure in bladder area - POCT UA - Microscopic Only - POCT urinalysis dipstick - Urine culture  5. UTI (lower urinary tract infection)  6. Nail fungus   Meds ordered this encounter  Medications  . furosemide (LASIX) 20 MG tablet    Sig: TAKE 1 TABLET (20 MG TOTAL) BY MOUTH DAILY.    Dispense:  30 tablet    Refill:  6   Septra DS 1 twice daily for 7 days  Patient Instructions  Continue current medications. Continue good therapeutic lifestyle changes which include good diet and exercise. Fall precautions discussed with patient. If an FOBT was given today- please return it to our front desk. If you are over 61 years old - you may need Prevnar 13 or the adult Pneumonia vaccine.  Flu Shots will be available at our office starting mid- September. Please call and schedule a FLU CLINIC APPOINTMENT.   Drink plenty of fluids Take antibiotic as directed Recheck urinalysis several days after completing the antibiotic We will call you with the results of the urine culture and sensitivity  when it becomes available We will make an appointment for you to see the dermatologist Please discuss with your psychiatrist the low blood pressure from the Seroquel   Nyra Capeson W. Zaylen Susman MD

## 2014-10-03 NOTE — Addendum Note (Signed)
Addended by: Magdalene RiverBULLINS, JAMIE H on: 10/03/2014 04:16 PM   Modules accepted: Orders

## 2014-10-05 ENCOUNTER — Telehealth: Payer: Self-pay | Admitting: *Deleted

## 2014-10-05 LAB — URINE CULTURE

## 2014-10-05 NOTE — Telephone Encounter (Signed)
Pt aware of lab result and will cont. Cipro.  rs

## 2014-10-05 NOTE — Telephone Encounter (Signed)
-----   Message from Ernestina Pennaonald W Moore, MD sent at 10/05/2014  4:23 PM EST ----- The urine culture indicates the bacteria that is causing the infection is sensitive to the sulfa that you have been prescribed. Continue to take this. It was also sensitive to ciprofloxacin.

## 2014-10-18 ENCOUNTER — Other Ambulatory Visit: Payer: Self-pay | Admitting: Family Medicine

## 2014-10-22 ENCOUNTER — Other Ambulatory Visit: Payer: Self-pay | Admitting: Family Medicine

## 2014-10-23 ENCOUNTER — Telehealth: Payer: Self-pay | Admitting: Family Medicine

## 2014-10-23 NOTE — Telephone Encounter (Signed)
Allopurinol was given to her over a year back, probably for gout or joint pain.  The refill was probably done electronically.  She could try to take back to pharmacy or keep for any joint pains.  Call us back for concerns.

## 2014-11-12 ENCOUNTER — Encounter: Payer: Self-pay | Admitting: Family Medicine

## 2014-11-12 ENCOUNTER — Ambulatory Visit (INDEPENDENT_AMBULATORY_CARE_PROVIDER_SITE_OTHER): Payer: Medicare Other | Admitting: Family Medicine

## 2014-11-12 VITALS — BP 129/73 | HR 110 | Temp 97.7°F | Ht 66.0 in | Wt 172.0 lb

## 2014-11-12 DIAGNOSIS — R51 Headache: Secondary | ICD-10-CM

## 2014-11-12 DIAGNOSIS — L03019 Cellulitis of unspecified finger: Secondary | ICD-10-CM

## 2014-11-12 DIAGNOSIS — R519 Headache, unspecified: Secondary | ICD-10-CM

## 2014-11-12 MED ORDER — LEVOFLOXACIN 500 MG PO TABS: 500.0000 mg | ORAL_TABLET | Freq: Every day | ORAL | Status: DC

## 2014-11-12 MED ORDER — HYDROCODONE-ACETAMINOPHEN 5-325 MG PO TABS: 1.0000 | ORAL_TABLET | Freq: Four times a day (QID) | ORAL | Status: DC | PRN

## 2014-11-12 NOTE — Progress Notes (Signed)
   Subjective:    Patient ID: Erin Donaldson, female    DOB: 10-08-1951, 63 y.o.   MRN: [id number]  HPI Patient has been having uri sx's and headache.  She has an infection on her right index finger. She has been putting triple abx cream on her finger but it is not helping.  Review of Systems  Constitutional: Negative for fever.  HENT: Negative for ear pain.   Eyes: Negative for discharge.  Respiratory: Negative for cough.   Cardiovascular: Negative for chest pain.  Gastrointestinal: Negative for abdominal distention.  Endocrine: Negative for polyuria.  Genitourinary: Negative for difficulty urinating.  Musculoskeletal: Negative for gait problem and neck pain.  Skin: Negative for color change and rash.  Neurological: Negative for speech difficulty and headaches.  Psychiatric/Behavioral: Negative for agitation.       Objective:    BP 129/73 mmHg  Pulse 110  Temp(Src) 97.7 F (36.5 C) (Oral)  Ht 5\' 6"  (1.676 m)  Wt 172 lb (78.019 kg)  BMI 27.77 kg/m2 Physical Exam  Constitutional: She is oriented to person, place, and time. She appears well-developed and well-nourished.  HENT:  Head: Normocephalic and atraumatic.  Mouth/Throat: Oropharynx is clear and moist.  Eyes: Pupils are equal, round, and reactive to light.  Neck: Normal range of motion. Neck supple.  Cardiovascular: Normal rate and regular rhythm.   No murmur heard. Pulmonary/Chest: Effort normal and breath sounds normal.  Abdominal: Soft. Bowel sounds are normal. There is no tenderness.  Neurological: She is alert and oriented to person, place, and time.  Skin: Skin is warm and dry.  Right index finger with paronchia and cellulitis.  Psychiatric: She has a normal mood and affect.          Assessment & Plan:     ICD-9-CM ICD-10-CM   1. Cellulitis of finger, unspecified laterality 681.00 L03.019 levofloxacin (LEVAQUIN) 500 MG tablet     HYDROcodone-acetaminophen (NORCO) 5-325 MG per tablet  2. Headache  around the eyes 784.0 R51 levofloxacin (LEVAQUIN) 500 MG tablet     HYDROcodone-acetaminophen (NORCO) 5-325 MG per tablet   Discussed I&D and wants to wait and follow up prn  No Follow-up on file.  Deatra CanterWilliam J Mont Jagoda FNP

## 2014-11-15 ENCOUNTER — Encounter: Payer: Self-pay | Admitting: *Deleted

## 2014-11-18 ENCOUNTER — Other Ambulatory Visit: Payer: Self-pay | Admitting: Family Medicine

## 2014-11-22 ENCOUNTER — Ambulatory Visit: Payer: Medicare Other | Admitting: Neurology

## 2014-11-22 ENCOUNTER — Ambulatory Visit (INDEPENDENT_AMBULATORY_CARE_PROVIDER_SITE_OTHER): Payer: Medicare Other | Admitting: Family Medicine

## 2014-11-22 ENCOUNTER — Encounter: Payer: Self-pay | Admitting: Family Medicine

## 2014-11-22 VITALS — BP 126/79 | HR 106 | Temp 97.5°F | Ht 66.0 in | Wt 175.6 lb

## 2014-11-22 DIAGNOSIS — J206 Acute bronchitis due to rhinovirus: Secondary | ICD-10-CM

## 2014-11-22 MED ORDER — AMOXICILLIN 875 MG PO TABS: 875.0000 mg | ORAL_TABLET | Freq: Two times a day (BID) | ORAL | Status: DC

## 2014-11-22 MED ORDER — HYDROCODONE-HOMATROPINE 5-1.5 MG/5ML PO SYRP: 5.0000 mL | ORAL_SOLUTION | Freq: Three times a day (TID) | ORAL | Status: DC | PRN

## 2014-11-22 MED ORDER — METHYLPREDNISOLONE (PAK) 4 MG PO TABS: ORAL_TABLET | ORAL | Status: DC

## 2014-11-22 NOTE — Progress Notes (Signed)
   Subjective:    Patient ID: Erin Donaldson, female    DOB: [birth year], 64 y.o.   MRN: [id number]  HPI Patient is here with c/o severe cough and uri sx's.  Review of Systems  Constitutional: Negative for fever.  HENT: Negative for ear pain.   Eyes: Negative for discharge.  Respiratory: Negative for cough.   Cardiovascular: Negative for chest pain.  Gastrointestinal: Negative for abdominal distention.  Endocrine: Negative for polyuria.  Genitourinary: Negative for difficulty urinating.  Musculoskeletal: Negative for gait problem and neck pain.  Skin: Negative for color change and rash.  Neurological: Negative for speech difficulty and headaches.  Psychiatric/Behavioral: Negative for agitation.       Objective:    BP 126/79 mmHg  Pulse 106  Temp(Src) 97.5 F (36.4 C) (Oral)  Ht 5\' 6"  (1.676 m)  Wt 175 lb 9.6 oz (79.652 kg)  BMI 28.36 kg/m2 Physical Exam  Constitutional: She is oriented to person, place, and time. She appears well-developed and well-nourished.  HENT:  Head: Normocephalic and atraumatic.  Mouth/Throat: Oropharynx is clear and moist.  Eyes: Pupils are equal, round, and reactive to light.  Neck: Normal range of motion. Neck supple.  Cardiovascular: Normal rate and regular rhythm.   No murmur heard. Pulmonary/Chest: Effort normal and breath sounds normal.  Abdominal: Soft. Bowel sounds are normal. There is no tenderness.  Neurological: She is alert and oriented to person, place, and time.  Skin: Skin is warm and dry.  Psychiatric: She has a normal mood and affect.          Assessment & Plan:     ICD-9-CM ICD-10-CM   1. Acute bronchitis due to Rhinovirus 466.0 J20.6 HYDROcodone-homatropine (HYCODAN) 5-1.5 MG/5ML syrup   079.3  methylPREDNIsolone (MEDROL DOSPACK) 4 MG tablet     amoxicillin (AMOXIL) 875 MG tablet  Push po fluids, rest, tylenol and motrin otc prn as directed for fever, arthralgias, and myalgias.  Follow up prn if sx's continue or  persist.   No Follow-up on file.  Deatra CanterWilliam J Oxford FNP

## 2014-11-26 ENCOUNTER — Other Ambulatory Visit: Payer: Self-pay | Admitting: Family Medicine

## 2014-11-27 NOTE — Telephone Encounter (Signed)
Last seen 11/22/14 B Oxford  This med not on EPIC list 

## 2014-11-30 ENCOUNTER — Other Ambulatory Visit: Payer: Self-pay | Admitting: Family Medicine

## 2014-11-30 ENCOUNTER — Telehealth: Payer: Self-pay | Admitting: Family Medicine

## 2014-11-30 MED ORDER — FLUCONAZOLE 150 MG PO TABS: 150.0000 mg | ORAL_TABLET | Freq: Once | ORAL | Status: DC

## 2015-01-16 ENCOUNTER — Other Ambulatory Visit: Payer: Self-pay | Admitting: Neurology

## 2015-01-19 ENCOUNTER — Other Ambulatory Visit: Payer: Self-pay | Admitting: Family Medicine

## 2015-01-31 ENCOUNTER — Other Ambulatory Visit: Payer: Self-pay | Admitting: Neurology

## 2015-01-31 NOTE — Telephone Encounter (Signed)
Patient has not been seen since 2014, cancelled last 2 scheduled appts.

## 2015-02-11 ENCOUNTER — Ambulatory Visit: Payer: Medicare Other | Admitting: Family Medicine

## 2015-02-14 ENCOUNTER — Ambulatory Visit: Payer: Medicare Other | Admitting: Family Medicine

## 2015-02-24 ENCOUNTER — Other Ambulatory Visit: Payer: Self-pay | Admitting: Family Medicine

## 2015-02-25 ENCOUNTER — Encounter: Payer: Self-pay | Admitting: Family Medicine

## 2015-02-25 ENCOUNTER — Ambulatory Visit (INDEPENDENT_AMBULATORY_CARE_PROVIDER_SITE_OTHER): Payer: Medicare Other | Admitting: Family Medicine

## 2015-02-25 VITALS — BP 100/68 | HR 98 | Temp 97.9°F | Ht 66.0 in | Wt 167.0 lb

## 2015-02-25 DIAGNOSIS — E559 Vitamin D deficiency, unspecified: Secondary | ICD-10-CM | POA: Diagnosis not present

## 2015-02-25 DIAGNOSIS — R748 Abnormal levels of other serum enzymes: Secondary | ICD-10-CM | POA: Diagnosis not present

## 2015-02-25 DIAGNOSIS — E118 Type 2 diabetes mellitus with unspecified complications: Secondary | ICD-10-CM | POA: Diagnosis not present

## 2015-02-25 DIAGNOSIS — K219 Gastro-esophageal reflux disease without esophagitis: Secondary | ICD-10-CM

## 2015-02-25 DIAGNOSIS — R531 Weakness: Secondary | ICD-10-CM

## 2015-02-25 DIAGNOSIS — I959 Hypotension, unspecified: Secondary | ICD-10-CM

## 2015-02-25 LAB — POCT CBC
Granulocyte percent: 74.2 %G (ref 37–80)
HCT, POC: 30.8 % — AB (ref 37.7–47.9)
Hemoglobin: 9.1 g/dL — AB (ref 12.2–16.2)
Lymph, poc: 1.7 (ref 0.6–3.4)
MCH, POC: 20 pg — AB (ref 27–31.2)
MCHC: 29.5 g/dL — AB (ref 31.8–35.4)
MCV: 67.6 fL — AB (ref 80–97)
MPV: 7.9 fL (ref 0–99.8)
POC Granulocyte: 7 — AB (ref 2–6.9)
POC LYMPH PERCENT: 17.8 %L (ref 10–50)
Platelet Count, POC: 348 10*3/uL (ref 142–424)
RBC: 4.56 M/uL (ref 4.04–5.48)
RDW, POC: 18.2 %
WBC: 9.5 10*3/uL (ref 4.6–10.2)

## 2015-02-25 LAB — POCT GLYCOSYLATED HEMOGLOBIN (HGB A1C): Hemoglobin A1C: 5.8

## 2015-02-25 MED ORDER — DONEPEZIL HCL 10 MG PO TABS: ORAL_TABLET | ORAL | Status: DC

## 2015-02-25 NOTE — Patient Instructions (Addendum)
Continue current medications. Continue good therapeutic lifestyle changes which include good diet and exercise. Fall precautions discussed with patient. If an FOBT was given today- please return it to our front desk. If you are over 657 years old - you may need Prevnar 13 or the adult Pneumonia vaccine.  Flu Shots are still available at our office. If you still haven't had one please call to set up a nurse visit to get one.   After your visit with us today you will receive a survey in the mail or online from [employer] regarding your care with us. Please take a moment to fill this out. Your feedback is very important to us as you can help us better understand your patient needs as well as improve your experience and satisfaction. WE CARE ABOUT YOU!!!   Continue follow-up with Dr. Jennelle Humanottle We will arrange for you to see Dr. Antoine PocheHochrein  because of these hypotensive episodes in the morning and lightheadedness Try taking Allegra/Claritin/or Zyrtec daily in addition to continue to use the Flonase Also use saline nose spray during the day and drink plenty of liquids We will call you with your lab work results once those results become available Continue to monitor your blood pressures once or twice daily Return the FOBT We will refill your Aricept

## 2015-02-25 NOTE — Progress Notes (Signed)
Subjective:    Patient ID: Erin Donaldson, female    DOB: 1951/11/15, 64 y.o.   MRN: [id number]  HPI Pt here for follow up and management of chronic medical problems which includes GERD and diabetes. She is taking medications regularly. The patient complains of some sinus pressure and headaches. She also indicates her blood pressure seems to be running lower in the morning. She continues to see her psychiatrist, Dr. Clovis Pu. She states that she sees him about every 4-5 months. He is unaware that the Seroquel might cause some of her hypotensive episodes but she been taking this for 2 years and the hypotensive episodes have just occurred over the past 4-6 months. She will get a traditional lab panel today and will be given an FOBT to return.         Patient Active Problem List   Diagnosis Date Noted  . History of gastric bypass 01/15/2014  . Amnestic disorder due to medical condition 08/23/2013  . Diarrhea 07/19/2013  . BIPOLAR AFFECTIVE DISORDER 08/26/2009  . ANXIETY 08/26/2009  . MIGRAINE HEADACHE 08/26/2009  . DIABETES MELLITUS, TYPE II 08/15/2009  . HYPERLIPIDEMIA 08/15/2009  . HYPERTENSION 08/15/2009  . GERD 08/15/2009  . CONSTIPATION 08/15/2009  . IRRITABLE BOWEL SYNDROME 08/15/2009   Outpatient Encounter Prescriptions as of 02/25/2015  Medication Sig  . cyclobenzaprine (FLEXERIL) 10 MG tablet 1/2 to 1 tab twice a day as directed  . donepezil (ARICEPT) 10 MG tablet TAKE 1 TABLET (10 MG TOTAL) BY MOUTH DAILY.  . fluconazole (DIFLUCAN) 150 MG tablet Take 1 tablet (150 mg total) by mouth once.  . fluticasone (FLONASE) 50 MCG/ACT nasal spray USE 1 SPRAY IN EACH NOSTRIL DAILY  . furosemide (LASIX) 20 MG tablet TAKE 1 TABLET (20 MG TOTAL) BY MOUTH DAILY.  Marland Kitchen gabapentin (NEURONTIN) 300 MG capsule TAKE 3 CAPSULE BY MOUTH THREE TIMES A DAY  . gabapentin (NEURONTIN) 300 MG capsule TAKE 3 CAPSULE BY MOUTH THREE TIMES A DAY  . lamoTRIgine (LAMICTAL) 200 MG tablet Take 200 mg by mouth  daily.  . methocarbamol (ROBAXIN) 750 MG tablet TAKE 1 TABLET (750 MG TOTAL) BY MOUTH 4 (FOUR) TIMES DAILY.  . methylPREDNIsolone (MEDROL DOSPACK) 4 MG tablet follow package directions  . omeprazole (PRILOSEC) 40 MG capsule TAKE 2 CAPSULES (80 MG TOTAL) BY MOUTH DAILY.  Marland Kitchen QUEtiapine (SEROQUEL) 200 MG tablet Take 200 mg by mouth daily.  . QUEtiapine (SEROQUEL) 400 MG tablet Take 800 mg by mouth at bedtime.   . rizatriptan (MAXALT) 10 MG tablet Take 1 tablet (10 mg total) by mouth as needed. May repeat in 2 hours if needed  . [DISCONTINUED] amoxicillin (AMOXIL) 875 MG tablet Take 1 tablet (875 mg total) by mouth 2 (two) times daily.  . [DISCONTINUED] HYDROcodone-acetaminophen (NORCO) 5-325 MG per tablet Take 1 tablet by mouth every 6 (six) hours as needed for moderate pain.  . [DISCONTINUED] HYDROcodone-homatropine (HYCODAN) 5-1.5 MG/5ML syrup Take 5 mLs by mouth every 8 (eight) hours as needed for cough.  . [DISCONTINUED] levofloxacin (LEVAQUIN) 500 MG tablet Take 1 tablet (500 mg total) by mouth daily.    Review of Systems  Constitutional: Negative.   HENT: Positive for sinus pressure.   Eyes: Negative.   Respiratory: Negative.   Cardiovascular: Negative.        Low BP in the mornings - gets better throughout the day.   Gastrointestinal: Negative.   Endocrine: Negative.   Genitourinary: Negative.   Musculoskeletal: Negative.   Skin: Negative.   Allergic/Immunologic:  Negative.   Neurological: Positive for headaches.  Hematological: Negative.   Psychiatric/Behavioral: Negative.        Objective:   Physical Exam  Constitutional: She is oriented to person, place, and time. She appears well-developed and well-nourished.  The patient is alert and feeling well and just helped deliver her great-grandchild on an emergency basis at home.  HENT:  Head: Normocephalic and atraumatic.  Right Ear: External ear normal.  Left Ear: External ear normal.  Mouth/Throat: Oropharynx is clear and  moist.  There is nasal congestion bilaterally  Eyes: Conjunctivae and EOM are normal. Pupils are equal, round, and reactive to light. Right eye exhibits no discharge. Left eye exhibits no discharge. No scleral icterus.  She gets her eyes checked regularly about every 2 years  Neck: Normal range of motion. Neck supple. No thyromegaly present.  No bruits or anterior cervical adenopathy  Cardiovascular: Normal rate, regular rhythm, normal heart sounds and intact distal pulses.   No murmur heard. The pedal pulses were somewhat diminished especially on the right side but still palpable. The heart has a regular rate and rhythm at 72/m  Pulmonary/Chest: Effort normal and breath sounds normal. No respiratory distress. She has no wheezes. She has no rales. She exhibits no tenderness.  Lungs are clear anteriorly and posteriorly  Abdominal: Soft. Bowel sounds are normal. She exhibits no mass. There is no tenderness. There is no rebound and no guarding.  There is some slight epigastric tenderness most likely secondary to adhesions from her gastric bypass  Musculoskeletal: Normal range of motion. She exhibits no edema or tenderness.  Lymphadenopathy:    She has no cervical adenopathy.  Neurological: She is alert and oriented to person, place, and time. She has normal reflexes. No cranial nerve deficit.  Skin: Skin is warm and dry. No rash noted.  Psychiatric: She has a normal mood and affect. Her behavior is normal. Judgment and thought content normal.  Nursing note and vitals reviewed.  BP 100/68 mmHg  Pulse 98  Temp(Src) 97.9 F (36.6 C) (Oral)  Ht _0  (1.676 m)  Wt 167 lb (75.751 kg)  BMI 26.97 kg/m2        Assessment & Plan:  1. Gastroesophageal reflux disease, esophagitis presence not specified -The patient is doing well with her gastric bypass and having no reflux symptoms at this time. - POCT CBC - Hepatic function panel - Lipid panel  2. Vitamin D deficiency -She will continue  her current vitamin D depending upon results of lab work being done today. - POCT CBC - Vit D  25 hydroxy (rtn osteoporosis monitoring)  3. Type 2 diabetes mellitus with complication -She does not check her blood sugars at home because her blood sugars are always good, she is currently not taking any medication for this. - POCT CBC - POCT glycosylated hemoglobin (Hb A1C) - BMP8+EGFR - Lipid panel  4. Hypotension, unspecified hypotension type -We will have patient visit with cardiology to consider echocardiogram and Dopplers - Ambulatory referral to Cardiology  5. General weakness -No specific cause was found for her generalized weakness and we will review her lab work and make any further determinations from reviewing this. - Ambulatory referral to Cardiology  Meds ordered this encounter  Medications  . donepezil (ARICEPT) 10 MG tablet    Sig: TAKE 1 TABLET (10 MG TOTAL) BY MOUTH DAILY.    Dispense:  90 tablet    Refill:  1   Patient Instructions  Continue current medications. Continue good  therapeutic lifestyle changes which include good diet and exercise. Fall precautions discussed with patient. If an FOBT was given today- please return it to our front desk. If you are over 70 years old - you may need Prevnar 51 or the adult Pneumonia vaccine.  Flu Shots are still available at our office. If you still haven't had one please call to set up a nurse visit to get one.   After your visit with [place] today you will receive a survey in the mail or online from [employer] regarding your care with [place]. Please take a moment to fill this out. Your feedback is very important to [place] as you can help [place] better understand your patient needs as well as improve your experience and satisfaction. WE CARE ABOUT YOU!!!   Continue follow-up with Dr. Clovis Pu We will arrange for you to see Dr. Percival Spanish  because of these hypotensive episodes in the morning and lightheadedness Try taking Allegra/Claritin/or  Zyrtec daily in addition to continue to use the Flonase Also use saline nose spray during the day and drink plenty of liquids We will call you with your lab work results once those results become available Continue to monitor your blood pressures once or twice daily Return the FOBT We will refill your Aricept   Arrie Senate MD

## 2015-02-26 ENCOUNTER — Telehealth: Payer: Self-pay | Admitting: Family Medicine

## 2015-02-26 LAB — HEPATIC FUNCTION PANEL
ALT: 8 IU/L (ref 0–32)
AST: 16 IU/L (ref 0–40)
Albumin: 4 g/dL (ref 3.6–4.8)
Alkaline Phosphatase: 300 IU/L — ABNORMAL HIGH (ref 39–117)
Bilirubin Total: <0.2 mg/dL (ref 0.0–1.2)
Bilirubin, Direct: 0.05 mg/dL (ref 0.00–0.40)
Total Protein: 6.6 g/dL (ref 6.0–8.5)

## 2015-02-26 LAB — VITAMIN D 25 HYDROXY (VIT D DEFICIENCY, FRACTURES): Vit D, 25-Hydroxy: <4 ng/mL — ABNORMAL LOW (ref 30.0–100.0)

## 2015-02-26 LAB — LIPID PANEL
Chol/HDL Ratio: 2.7 ratio units (ref 0.0–4.4)
Cholesterol, Total: 171 mg/dL (ref 100–199)
HDL: 63 mg/dL (ref >39)
LDL Calculated: 82 mg/dL (ref 0–99)
Triglycerides: 130 mg/dL (ref 0–149)
VLDL Cholesterol Cal: 26 mg/dL (ref 5–40)

## 2015-02-26 LAB — BMP8+EGFR
BUN/Creatinine Ratio: 11 (ref 11–26)
BUN: 10 mg/dL (ref 8–27)
CO2: 22 mmol/L (ref 18–29)
Calcium: 8.7 mg/dL (ref 8.7–10.3)
Chloride: 102 mmol/L (ref 97–108)
Creatinine, Ser: 0.94 mg/dL (ref 0.57–1.00)
GFR calc Af Amer: 76 mL/min/{1.73_m2} (ref >59)
GFR calc non Af Amer: 66 mL/min/{1.73_m2} (ref >59)
Glucose: 85 mg/dL (ref 65–99)
Potassium: 4.1 mmol/L (ref 3.5–5.2)
Sodium: 142 mmol/L (ref 134–144)

## 2015-02-26 NOTE — Addendum Note (Signed)
Addended by: Tamera PuntWRAY, WENDY S on: 02/26/2015 03:57 PM   Modules accepted: Orders

## 2015-02-26 NOTE — Telephone Encounter (Signed)
Patient aware of results.

## 2015-02-27 LAB — FERRITIN: Ferritin: 5 ng/mL — ABNORMAL LOW (ref 15–150)

## 2015-02-27 LAB — IRON AND TIBC
Iron Saturation: 6 % — CL (ref 15–55)
Iron: 18 ug/dL — ABNORMAL LOW (ref 27–139)
Total Iron Binding Capacity: 317 ug/dL (ref 250–450)
UIBC: 299 ug/dL (ref 118–369)

## 2015-02-27 LAB — VITAMIN B12: Vitamin B-12: 240 pg/mL (ref 211–946)

## 2015-02-27 LAB — SPECIMEN STATUS REPORT

## 2015-02-28 ENCOUNTER — Telehealth: Payer: Self-pay | Admitting: Family Medicine

## 2015-02-28 DIAGNOSIS — R945 Abnormal results of liver function studies: Secondary | ICD-10-CM

## 2015-02-28 DIAGNOSIS — R7989 Other specified abnormal findings of blood chemistry: Secondary | ICD-10-CM | POA: Insufficient documentation

## 2015-02-28 NOTE — Telephone Encounter (Signed)
Pt aware of lab results 

## 2015-03-04 ENCOUNTER — Other Ambulatory Visit: Payer: Self-pay | Admitting: Pharmacist

## 2015-03-04 ENCOUNTER — Telehealth: Payer: Self-pay | Admitting: *Deleted

## 2015-03-04 DIAGNOSIS — R748 Abnormal levels of other serum enzymes: Secondary | ICD-10-CM

## 2015-03-04 MED ORDER — FAMOTIDINE 20 MG PO TABS
20.0000 mg | ORAL_TABLET | Freq: Two times a day (BID) | ORAL | Status: DC
Start: 2015-03-04 — End: 2015-04-07

## 2015-03-04 NOTE — Telephone Encounter (Signed)
Pt aware to stop diflucan (which was short term), omeprazole, and Aricept  She will come to lab in 2 weeks for repeat levels - orders are in She states she cant go without med for reflux - i spoke with Tammy (pharm) - she recommends Pepcid 20 mg BID - this was sent on to pharm

## 2015-03-04 NOTE — Telephone Encounter (Signed)
-----  Message from Chipper Herb, MD sent at 03/04/2015  8:55 AM EDT ----- Regarding: RE: patient's elevated alk phos I believe that the patient has already stopped the Aricept. If she has not we must have her discontinue that. Janie we'll call the patient and ask her to stop the omeprazole and the fluconazole. We will need to call her psychiatrist and see about stopping the Lamictal. Should repeat liver function tests in 2 weeks. ----- Message -----    From: Cherre Robins, PHARMD    Sent: 03/04/2015   8:14 AM      To: Chipper Herb, MD Subject: patient's elevated alk phos                    Dr Laurance Flatten,  There were several medications identified on Mrs. Advani's medication list that could potentially increase LFTs.  I think the most likely are fluconazole, lamotrigine, omeprazole and donepezil.   Also cyclobenzaprine, furosemide and quetiapine can also increase LFTs but reports are less than 1%.   I recommend start with stopping non essential medications - fluconazole and maybe donepezil and/or omprazole.  Also consider performing Alk Phos Isoenzymes to determine if elevated Alk Phos origin is liver, bone or intestinal.

## 2015-03-05 ENCOUNTER — Other Ambulatory Visit: Payer: Medicare Other

## 2015-03-05 DIAGNOSIS — Z1212 Encounter for screening for malignant neoplasm of rectum: Secondary | ICD-10-CM

## 2015-03-05 NOTE — Progress Notes (Signed)
Lab only 

## 2015-03-06 LAB — FECAL OCCULT BLOOD, IMMUNOCHEMICAL: Fecal Occult Bld: NEGATIVE

## 2015-03-07 ENCOUNTER — Other Ambulatory Visit (HOSPITAL_COMMUNITY): Payer: Self-pay

## 2015-03-12 ENCOUNTER — Telehealth: Payer: Self-pay | Admitting: Family Medicine

## 2015-03-12 NOTE — Telephone Encounter (Signed)
Meclizine 12.5 mg #30----one 3 times daily with food for dizziness of inner ear

## 2015-03-13 MED ORDER — MECLIZINE HCL 12.5 MG PO TABS: 12.5000 mg | ORAL_TABLET | Freq: Three times a day (TID) | ORAL | Status: DC | PRN

## 2015-03-13 NOTE — Telephone Encounter (Signed)
Left detailed message on voicemail stating rx sent to pharmacy as requested, to CB with any further questions or concerns.

## 2015-03-31 ENCOUNTER — Other Ambulatory Visit: Payer: Self-pay | Admitting: Family Medicine

## 2015-04-02 ENCOUNTER — Other Ambulatory Visit (INDEPENDENT_AMBULATORY_CARE_PROVIDER_SITE_OTHER): Payer: Medicare Other

## 2015-04-02 DIAGNOSIS — R748 Abnormal levels of other serum enzymes: Secondary | ICD-10-CM | POA: Diagnosis not present

## 2015-04-02 NOTE — Progress Notes (Signed)
Lab only 

## 2015-04-03 LAB — HEPATIC FUNCTION PANEL
ALT: 23 IU/L (ref 0–32)
AST: 20 IU/L (ref 0–40)
Albumin: 4.3 g/dL (ref 3.6–4.8)
Alkaline Phosphatase: 387 IU/L — ABNORMAL HIGH (ref 39–117)
Bilirubin Total: <0.2 mg/dL (ref 0.0–1.2)
Bilirubin, Direct: 0.04 mg/dL (ref 0.00–0.40)
Total Protein: 6.5 g/dL (ref 6.0–8.5)

## 2015-04-03 LAB — ALKALINE PHOSPHATASE, ISOENZYMES
BONE FRACTION: 53 % (ref 14–68)
INTESTINAL FRAC.: 0 % (ref 0–18)
LIVER FRACTION: 47 % (ref 18–85)

## 2015-04-04 ENCOUNTER — Telehealth: Payer: Self-pay | Admitting: *Deleted

## 2015-04-04 DIAGNOSIS — R7989 Other specified abnormal findings of blood chemistry: Secondary | ICD-10-CM

## 2015-04-04 DIAGNOSIS — R945 Abnormal results of liver function studies: Principal | ICD-10-CM

## 2015-04-04 NOTE — Telephone Encounter (Signed)
Pt notified of results Verbalizes understanding 

## 2015-04-04 NOTE — Telephone Encounter (Signed)
-----   Message from Ernestina Pennaonald W Moore, MD sent at 04/04/2015  7:15 AM EDT ----- The alkaline phosphatase has risen more than it was one month ago. The patient needs to have an ultrasound of the liver and abdomen. Please schedule this and have the patient repeat liver function test in 2 weeks Please have clinical pharmacy to review all her medicines to see if any of these could be playing a role with this rising liver function test

## 2015-04-04 NOTE — Telephone Encounter (Signed)
-----   Message from Donald W Moore, MD sent at 04/04/2015  7:15 AM EDT ----- The alkaline phosphatase has risen more than it was one month ago. The patient needs to have an ultrasound of the liver and abdomen. Please schedule this and have the patient repeat liver function test in 2 weeks Please have clinical pharmacy to review all her medicines to see if any of these could be playing a role with this rising liver function test 

## 2015-04-07 ENCOUNTER — Other Ambulatory Visit: Payer: Self-pay | Admitting: Family Medicine

## 2015-04-10 ENCOUNTER — Telehealth: Payer: Self-pay | Admitting: *Deleted

## 2015-04-10 NOTE — Telephone Encounter (Signed)
-----   Message from Magdalene RiverJamie H Bullins, LPN sent at 5/28/41325/23/2016  6:39 PM EDT -----   ----- Message -----    From: Ernestina Pennaonald W Moore, MD    Sent: 04/08/2015  10:38 AM      To: Magdalene RiverJamie H Bullins, LPN  Please schedule this patient for an appointment with the clinical pharmacists to review and discuss medications which could be causing elevated liver function tests

## 2015-04-10 NOTE — Telephone Encounter (Signed)
Please call. We need to schedule an appt with our pharmacist to discuss meds that may not be good for your liver.

## 2015-04-11 ENCOUNTER — Other Ambulatory Visit (HOSPITAL_COMMUNITY): Payer: Self-pay

## 2015-04-18 ENCOUNTER — Other Ambulatory Visit: Payer: Self-pay | Admitting: Neurology

## 2015-04-18 ENCOUNTER — Ambulatory Visit (HOSPITAL_COMMUNITY)
Admission: RE | Admit: 2015-04-18 | Discharge: 2015-04-18 | Disposition: A | Discharge status: Home / Self Care | Payer: Medicare Other | Source: Ambulatory Visit | Attending: Family Medicine | Admitting: Family Medicine

## 2015-04-18 DIAGNOSIS — R7989 Other specified abnormal findings of blood chemistry: Secondary | ICD-10-CM

## 2015-04-18 DIAGNOSIS — E119 Type 2 diabetes mellitus without complications: Secondary | ICD-10-CM | POA: Insufficient documentation

## 2015-04-18 DIAGNOSIS — K760 Fatty (change of) liver, not elsewhere classified: Secondary | ICD-10-CM | POA: Diagnosis not present

## 2015-04-18 DIAGNOSIS — R932 Abnormal findings on diagnostic imaging of liver and biliary tract: Secondary | ICD-10-CM | POA: Insufficient documentation

## 2015-04-18 DIAGNOSIS — R945 Abnormal results of liver function studies: Secondary | ICD-10-CM | POA: Insufficient documentation

## 2015-04-18 DIAGNOSIS — Z9049 Acquired absence of other specified parts of digestive tract: Secondary | ICD-10-CM | POA: Diagnosis not present

## 2015-04-23 ENCOUNTER — Telehealth: Payer: Self-pay | Admitting: Family Medicine

## 2015-04-23 NOTE — Telephone Encounter (Signed)
Per results appointment given with Christell ConstantMoore to review.

## 2015-04-24 ENCOUNTER — Ambulatory Visit (INDEPENDENT_AMBULATORY_CARE_PROVIDER_SITE_OTHER): Payer: Medicare Other | Admitting: Family Medicine

## 2015-04-24 ENCOUNTER — Encounter: Payer: Self-pay | Admitting: Family Medicine

## 2015-04-24 VITALS — BP 119/81 | HR 106 | Temp 97.7°F | Ht 66.0 in | Wt 162.0 lb

## 2015-04-24 DIAGNOSIS — R7989 Other specified abnormal findings of blood chemistry: Secondary | ICD-10-CM | POA: Diagnosis not present

## 2015-04-24 DIAGNOSIS — F4321 Adjustment disorder with depressed mood: Secondary | ICD-10-CM

## 2015-04-24 DIAGNOSIS — R300 Dysuria: Secondary | ICD-10-CM

## 2015-04-24 DIAGNOSIS — R945 Abnormal results of liver function studies: Secondary | ICD-10-CM

## 2015-04-24 DIAGNOSIS — N342 Other urethritis: Secondary | ICD-10-CM | POA: Diagnosis not present

## 2015-04-24 LAB — POCT URINALYSIS DIPSTICK
Bilirubin, UA: NEGATIVE
Blood, UA: NEGATIVE
Glucose, UA: NEGATIVE
Ketones, UA: NEGATIVE
Nitrite, UA: NEGATIVE
Protein, UA: NEGATIVE
Spec Grav, UA: 1.01
Urobilinogen, UA: NEGATIVE
pH, UA: 6

## 2015-04-24 LAB — POCT UA - MICROSCOPIC ONLY
Bacteria, U Microscopic: NEGATIVE
Casts, Ur, LPF, POC: NEGATIVE
Crystals, Ur, HPF, POC: NEGATIVE
Mucus, UA: NEGATIVE
RBC, urine, microscopic: NEGATIVE
Yeast, UA: NEGATIVE

## 2015-04-24 MED ORDER — CIPROFLOXACIN HCL 500 MG PO TABS: 500.0000 mg | ORAL_TABLET | Freq: Two times a day (BID) | ORAL | Status: DC

## 2015-04-24 NOTE — Patient Instructions (Addendum)
The patient should continue to try to reduce the Seroquel if possible She should avoid all medicines that may be metabolized in the liver as much as possible and this would include Tylenol and ibuprofen. She should continue to drink plenty of fluids We will check a hepatitis panel on her today We will most likely arrange for her to see the gastroenterologist if the liver function tests remain elevated for any further suggestions with this. We will wait to the lab work is returned. Urine culture result will be back in a few days.  Take antibiotic twice daily until completed Drink plenty of fluids, reemphasize

## 2015-04-24 NOTE — Progress Notes (Signed)
Subjective:    Patient ID: Erin Donaldson, female    DOB: [birth year], 64 y.o.   MRN: [id number]  HPI  Patient presents in office today to discuss [place] results of liver. The ultrasound report was reviewed with the patient and she was given a copy of this for her information. I'll clinical pharmacists reviewed her medication list and mention that Seroquel could play a role with elevated liver function tests. The patient was made aware of this process she has to have this for her depression and insomnia and has not changed the dose on this. We have asked her take a copy of the ultrasound report along with the lab work with her to see her psychiatrist. Patient is also having painful urination and when she wipes has blood on her toilet tissue. This is been going on for couple days. The patient is also dealing with some stress and anxiety with having to look after her 2 grandchildren at home because the patient's daughter has not been able to do this in a proper way.   Review of Systems  Constitutional: Negative.   HENT: Negative.   Eyes: Negative.   Respiratory: Negative.   Cardiovascular: Negative.   Gastrointestinal: Negative.   Endocrine: Negative.   Genitourinary: Positive for dysuria, urgency, frequency, hematuria and flank pain. Negative for decreased urine volume, vaginal bleeding, vaginal discharge, enuresis, difficulty urinating, genital sores, vaginal pain, menstrual problem, pelvic pain and dyspareunia.  Allergic/Immunologic: Negative.   Neurological: Negative.   Hematological: Negative.   Psychiatric/Behavioral: Negative.        Objective:   Physical Exam  Constitutional: She is oriented to person, place, and time. She appears well-developed and well-nourished. She appears distressed.  HENT:  Head: Normocephalic and atraumatic.  Eyes: Conjunctivae and EOM are normal. Pupils are equal, round, and reactive to light. Right eye exhibits no discharge. Left eye exhibits no discharge.  No scleral icterus.  Neck: Normal range of motion.  Cardiovascular: Normal rate and regular rhythm.   No murmur heard. Pulmonary/Chest: Effort normal and breath sounds normal. She has no wheezes. She has no rales.  Abdominal: Soft. Bowel sounds are normal. She exhibits no mass. There is tenderness. There is no rebound and no guarding.  There is suprapubic and epigastric tenderness  Musculoskeletal: Normal range of motion.  Neurological: She is alert and oriented to person, place, and time.  Skin: Skin is warm and dry. No rash noted.  Psychiatric: Her behavior is normal. Judgment and thought content normal.  The patient is somewhat tearful regarding the family circumstances at home with having to look after 2 teenage grandchildren.  Nursing note and vitals reviewed.   BP 119/81 mmHg  Pulse 106  Temp(Src) 97.7 F (36.5 C) (Oral)  Ht  (1.676 m)  Wt 162 lb (73.483 kg)  BMI 26.16 kg/m2  Results for orders placed or performed in visit on 04/24/15  POCT urinalysis dipstick  Result Value Ref Range   Color, UA yellow    Clarity, UA clear    Glucose, UA neg    Bilirubin, UA neg    Ketones, UA neg    Spec Grav, UA 1.010    Blood, UA neg    pH, UA 6.0    Protein, UA neg    Urobilinogen, UA negative    Nitrite, UA neg    Leukocytes, UA small (1+)   POCT UA - Microscopic Only  Result Value Ref Range   WBC, Ur, HPF, POC occ  RBC, urine, microscopic neg    Bacteria, U Microscopic neg    Mucus, UA neg    Epithelial cells, urine per micros occ    Crystals, Ur, HPF, POC neg    Casts, Ur, LPF, POC neg    Yeast, UA neg    The patient was informed of this result before she left the office.     Assessment & Plan:  1. Dysuria -There is minimal infection on the urinalysis and the patient was informed of this. She will continue to drink plenty of fluids and will take antibiotics for 3 days twice daily - POCT urinalysis dipstick - POCT UA - Microscopic Only - Urine culture -  ciprofloxacin (CIPRO) 500 MG tablet; Take 1 tablet (500 mg total) by mouth 2 (two) times daily.  Dispense: 6 tablet; Refill: 0  2. Elevated LFTs -Week we'll repeat the liver function tests today hoping that the numbers will be improved. If they continue to remain elevated and the hepatitis panel is negative we will most likely get an appointment for her to see the gastroenterologist for any further suggestions regarding the elevated liver function tests. - Hepatic function panel - Hepatitis panel, acute  3. Urethritis -Drink fluids and take antibiotic as directed - ciprofloxacin (CIPRO) 500 MG tablet; Take 1 tablet (500 mg total) by mouth 2 (two) times daily.  Dispense: 6 tablet; Refill: 0  4. Adjustment disorder with depressed mood -This is concerning her family and her concern about her daughter and having to look after her daughter's children or her grandchildren.  Meds ordered this encounter  Medications  . ciprofloxacin (CIPRO) 500 MG tablet    Sig: Take 1 tablet (500 mg total) by mouth 2 (two) times daily.    Dispense:  6 tablet    Refill:  0     Patient Instructions  The patient should continue to try to reduce the Seroquel if possible She should avoid all medicines that may be metabolized in the liver as much as possible and this would include Tylenol and ibuprofen. She should continue to drink plenty of fluids We will check a hepatitis panel on her today We will most likely arrange for her to see the gastroenterologist if the liver function tests remain elevated for any further suggestions with this. We will wait to the lab work is returned. Urine culture result will be back in a few days.  Take antibiotic twice daily until completed Drink plenty of fluids, reemphasize    Nyra Capeson W. Maryn Freelove MD

## 2015-04-25 ENCOUNTER — Ambulatory Visit: Payer: Medicare Other

## 2015-04-25 ENCOUNTER — Telehealth: Payer: Self-pay | Admitting: *Deleted

## 2015-04-25 LAB — HEPATIC FUNCTION PANEL
ALT: 9 IU/L (ref 0–32)
AST: 15 IU/L (ref 0–40)
Albumin: 4.2 g/dL (ref 3.6–4.8)
Alkaline Phosphatase: 309 IU/L — ABNORMAL HIGH (ref 39–117)
Bilirubin Total: <0.2 mg/dL (ref 0.0–1.2)
Bilirubin, Direct: 0.06 mg/dL (ref 0.00–0.40)
Total Protein: 6.5 g/dL (ref 6.0–8.5)

## 2015-04-25 LAB — URINE CULTURE

## 2015-04-25 LAB — HEPATITIS PANEL, ACUTE
Hep A IgM: NEGATIVE
Hep B C IgM: NEGATIVE
Hep C Virus Ab: <0.1 s/co ratio (ref 0.0–0.9)
Hepatitis B Surface Ag: NEGATIVE

## 2015-04-25 NOTE — Telephone Encounter (Signed)
Pt notified of results Verbalizes understanding 

## 2015-04-25 NOTE — Telephone Encounter (Signed)
-----   Message from Ernestina Penna, MD sent at 04/25/2015  7:22 AM EDT ----- Only 1 liver function test is elevated and it is lower than it was 3 weeks ago-----repeat liver function test in 4 weeks All tests for hepatitis a B and C were negative

## 2015-04-26 ENCOUNTER — Encounter: Payer: Self-pay | Admitting: *Deleted

## 2015-04-30 ENCOUNTER — Telehealth: Payer: Self-pay

## 2015-04-30 NOTE — Telephone Encounter (Signed)
Called patient to schedule her with Dr. Vickey Huger . Patient states she does not want to schedule at this time and she would call back to schedule.

## 2015-05-06 ENCOUNTER — Other Ambulatory Visit: Payer: Self-pay | Admitting: Family Medicine

## 2015-05-07 ENCOUNTER — Telehealth: Payer: Self-pay | Admitting: Family Medicine

## 2015-05-07 NOTE — Telephone Encounter (Signed)
This message is not regarding this pt.

## 2015-06-03 ENCOUNTER — Other Ambulatory Visit: Payer: Self-pay

## 2015-06-03 MED ORDER — METHOCARBAMOL 750 MG PO TABS: ORAL_TABLET | ORAL | Status: DC

## 2015-06-08 ENCOUNTER — Other Ambulatory Visit: Payer: Self-pay | Admitting: Family Medicine

## 2015-06-09 ENCOUNTER — Other Ambulatory Visit: Payer: Self-pay | Admitting: Family Medicine

## 2015-07-03 ENCOUNTER — Ambulatory Visit (INDEPENDENT_AMBULATORY_CARE_PROVIDER_SITE_OTHER): Payer: Medicare Other | Admitting: Family Medicine

## 2015-07-03 ENCOUNTER — Encounter: Payer: Self-pay | Admitting: Family Medicine

## 2015-07-03 VITALS — BP 124/85 | HR 95 | Temp 97.5°F | Ht 66.0 in | Wt 163.0 lb

## 2015-07-03 DIAGNOSIS — E559 Vitamin D deficiency, unspecified: Secondary | ICD-10-CM | POA: Diagnosis not present

## 2015-07-03 DIAGNOSIS — J060 Acute laryngopharyngitis: Secondary | ICD-10-CM | POA: Diagnosis not present

## 2015-07-03 DIAGNOSIS — K219 Gastro-esophageal reflux disease without esophagitis: Secondary | ICD-10-CM | POA: Diagnosis not present

## 2015-07-03 DIAGNOSIS — M25551 Pain in right hip: Secondary | ICD-10-CM

## 2015-07-03 DIAGNOSIS — E118 Type 2 diabetes mellitus with unspecified complications: Secondary | ICD-10-CM

## 2015-07-03 LAB — POCT GLYCOSYLATED HEMOGLOBIN (HGB A1C): Hemoglobin A1C: 6

## 2015-07-03 MED ORDER — CEPHALEXIN 500 MG PO CAPS: 500.0000 mg | ORAL_CAPSULE | Freq: Three times a day (TID) | ORAL | Status: DC

## 2015-07-03 MED ORDER — HYDROCODONE-ACETAMINOPHEN 5-500 MG PO TABS: 1.0000 | ORAL_TABLET | Freq: Every day | ORAL | Status: DC | PRN

## 2015-07-03 NOTE — Progress Notes (Signed)
Subjective:    Patient ID: Erin Donaldson, female    DOB: 10-15-1951, 64 y.o.   MRN: [id number]  HPI Pt here for follow up and management of chronic medical problems which includes diabetes. She is taking medications regularly. The patient today complains of sore throat and drainage and right leg pain to include right hip to the knee. She is due to get her mammogram and needs to get this as soon as possible. She is also due for lab work and we'll get a traditional panel today.  The patient is under a lot of stress having look after a 40-monthold with her ongoing health problems. She has had this sore throat and sinus drainage for about 2 weeks. She is hurting in the right lateral hip. There is no specific injury. She denies chest pain and shortness of breath. She has no trouble swallowing no heartburn indigestion vomiting diarrhea or blood in the stool. She does have some occasional nausea which she attributes to changing her eating habits because of looking after her 557-monthld child.      Patient Active Problem List   Diagnosis Date Noted  . Elevated LFTs   . History of gastric bypass 01/15/2014  . Amnestic disorder due to medical condition 08/23/2013  . Diarrhea 07/19/2013  . BIPOLAR AFFECTIVE DISORDER 08/26/2009  . ANXIETY 08/26/2009  . MIGRAINE HEADACHE 08/26/2009  . DIABETES MELLITUS, TYPE II 08/15/2009  . HYPERLIPIDEMIA 08/15/2009  . HYPERTENSION 08/15/2009  . GERD 08/15/2009  . CONSTIPATION 08/15/2009  . IRRITABLE BOWEL SYNDROME 08/15/2009   Outpatient Encounter Prescriptions as of 07/03/2015  Medication Sig  . cyclobenzaprine (FLEXERIL) 10 MG tablet TAKE 1/2 TO 1 TAB TWICE A DAY AS DIRECTED  . donepezil (ARICEPT) 10 MG tablet TAKE 1 TABLET (10 MG TOTAL) BY MOUTH DAILY.  . famotidine (PEPCID) 20 MG tablet TAKE 1 TABLET (20 MG TOTAL) BY MOUTH 2 (TWO) TIMES DAILY.  . fluticasone (FLONASE) 50 MCG/ACT nasal spray USE 1 SPRAY IN EACH NOSTRIL DAILY  . furosemide (LASIX) 20 MG  tablet TAKE 1 TABLET (20 MG TOTAL) BY MOUTH DAILY.  . Marland Kitchenabapentin (NEURONTIN) 300 MG capsule TAKE 3 CAPSULE BY MOUTH THREE TIMES A DAY  . gabapentin (NEURONTIN) 300 MG capsule TAKE 3 CAPSULE BY MOUTH THREE TIMES A DAY  . lamoTRIgine (LAMICTAL) 200 MG tablet Take 200 mg by mouth daily.  . meclizine (ANTIVERT) 12.5 MG tablet Take 1 tablet (12.5 mg total) by mouth 3 (three) times daily as needed for dizziness.  . methocarbamol (ROBAXIN) 750 MG tablet TAKE 1 TABLET (750 MG TOTAL) BY MOUTH 4 (FOUR) TIMES DAILY.  . Marland Kitchenmeprazole (PRILOSEC) 40 MG capsule TAKE 2 CAPSULES (80 MG TOTAL) BY MOUTH DAILY.  . Marland KitchenUEtiapine (SEROQUEL) 200 MG tablet Take 200 mg by mouth daily.  . QUEtiapine (SEROQUEL) 400 MG tablet Take 800 mg by mouth at bedtime.   . rizatriptan (MAXALT) 10 MG tablet Take 1 tablet (10 mg total) by mouth as needed. May repeat in 2 hours if needed  . [DISCONTINUED] ciprofloxacin (CIPRO) 500 MG tablet Take 1 tablet (500 mg total) by mouth 2 (two) times daily.   No facility-administered encounter medications on file as of 07/03/2015.      Review of Systems  Constitutional: Negative.   HENT: Positive for congestion and sore throat.   Eyes: Negative.   Respiratory: Negative.   Cardiovascular: Negative.   Gastrointestinal: Negative.   Endocrine: Negative.   Genitourinary: Negative.   Musculoskeletal: Positive for arthralgias (right hip to knee  pain).  Skin: Negative.   Allergic/Immunologic: Negative.   Neurological: Negative.   Hematological: Negative.   Psychiatric/Behavioral: Negative.        Objective:   Physical Exam  Constitutional: She is oriented to person, place, and time. She appears well-developed and well-nourished. No distress.  HENT:  Head: Normocephalic and atraumatic.  Right Ear: External ear normal.  Left Ear: External ear normal.  Mouth/Throat: Oropharynx is clear and moist.  Nasal congestion bilaterally and TMs are normal. No anterior cervical adenopathy.  Eyes:  Conjunctivae and EOM are normal. Pupils are equal, round, and reactive to light. Right eye exhibits no discharge. Left eye exhibits no discharge. No scleral icterus.  Neck: Normal range of motion. Neck supple. No thyromegaly present.  Cardiovascular: Normal rate, regular rhythm and normal heart sounds.  Exam reveals no friction rub.   No murmur heard. At 96/m  Pulmonary/Chest: Effort normal and breath sounds normal. No respiratory distress. She has no wheezes. She has no rales. She exhibits no tenderness.  Clear anteriorly and posteriorly  Abdominal: Soft. Bowel sounds are normal. She exhibits no mass. There is no tenderness. There is no rebound and no guarding.  No abdominal tenderness or bruits  Musculoskeletal: She exhibits no edema or tenderness.  There is some slight range of motion abnormalities with a right hip.  Lymphadenopathy:    She has no cervical adenopathy.  Neurological: She is alert and oriented to person, place, and time. She has normal reflexes. No cranial nerve deficit.  Skin: Skin is warm and dry. No rash noted.  The patient has some senile purpura on both arms.  Psychiatric: She has a normal mood and affect. Her behavior is normal. Judgment and thought content normal.  Nursing note and vitals reviewed.  BP 124/85 mmHg  Pulse 95  Temp(Src) 97.5 F (36.4 C) (Oral)  Ht '5\' 6"'  (1.676 m)  Wt 163 lb (73.936 kg)  BMI 26.32 kg/m2        Assessment & Plan:  1. Gastroesophageal reflux disease, esophagitis presence not specified -She has no complaints with this today. If she continues to take ibuprofen she must take this after eating and should only take it for short period of time - BMP8+EGFR - Hepatic function panel - Lipid panel - CBC with Differential/Platelet  2. Vitamin D deficiency -Continue current treatment pending results of lab work - Vit D  25 hydroxy (rtn osteoporosis monitoring) - CBC with Differential/Platelet  3. Type 2 diabetes mellitus with  complication -Continue aggressive therapeutic lifestyle changes pending results of lab work - BMP8+EGFR - POCT glycosylated hemoglobin (Hb A1C) - Lipid panel - CBC with Differential/Platelet  4. Sore throat and laryngitis -Take antibiotic until completed and use nasal saline frequently during the day - cephALEXin (KEFLEX) 500 MG capsule; Take 1 capsule (500 mg total) by mouth 3 (three) times daily.  Dispense: 30 capsule; Refill: 0  5. Right hip pain -Take NSAID for only a short period time and use warm wet compresses to right lateral hip  Meds ordered this encounter  Medications  . DISCONTD: cephALEXin (KEFLEX) 500 MG capsule    Sig: Take 1 capsule (500 mg total) by mouth 3 (three) times daily.    Dispense:  30 capsule    Refill:  0  . HYDROcodone-acetaminophen (VICODIN) 5-500 MG per tablet    Sig: Take 1 tablet by mouth daily as needed for pain.    Dispense:  14 tablet    Refill:  0  . cephALEXin (KEFLEX) 500  MG capsule    Sig: Take 1 capsule (500 mg total) by mouth 3 (three) times daily.    Dispense:  30 capsule    Refill:  0     Patient Instructions                       Medicare Annual Wellness Visit  [facility] and the medical providers at [facility] strive to bring you the best medical care.  In doing so we not only want to address your current medical conditions and concerns but also to detect new conditions early and prevent illness, disease and health-related problems.    Medicare offers a yearly Wellness Visit which allows our clinical staff to assess your need for preventative services including immunizations, lifestyle education, counseling to decrease risk of preventable diseases and screening for fall risk and other medical concerns.    This visit is provided free of charge (no copay) for all Medicare recipients. The clinical pharmacists at [facility] have begun to conduct these Wellness Visits which will also  include a thorough review of all your medications.    As you primary medical provider recommend that you make an appointment for your Annual Wellness Visit if you have not done so already this year.  You may set up this appointment before you leave today or you may call back ([phone]) and schedule an appointment.  Please make sure when you call that you mention that you are scheduling your Annual Wellness Visit with the clinical pharmacist so that the appointment may be made for the proper length of time.     Continue current medications. Continue good therapeutic lifestyle changes which include good diet and exercise. Fall precautions discussed with patient. If an FOBT was given today- please return it to our front desk. If you are over 32 years old - you may need Prevnar 28 or the adult Pneumonia vaccine.   After your visit with [place] today you will receive a survey in the mail or online from [employer] regarding your care with [place]. Please take a moment to fill this out. Your feedback is very important to [place] as you can help [place] better understand your patient needs as well as improve your experience and satisfaction. WE CARE ABOUT YOU!!!    the patient should use some warm wet compresses to the right lateral hip  She should take the antibiotic until it is completed 3 times daily  She should use nasal saline frequently through the day She should take the pain pill only if needed for severe pain for her hip She should continue to use ibuprofen twice daily after breakfast and supper She should use warm wet compresses to the right hip 20 minutes 3 or 4 times daily   Arrie Senate MD

## 2015-07-03 NOTE — Patient Instructions (Addendum)
Medicare Annual Wellness Visit  [place] and the medical providers at [facility] strive to bring you the best medical care.  In doing so we not only want to address your current medical conditions and concerns but also to detect new conditions early and prevent illness, disease and health-related problems.    Medicare offers a yearly Wellness Visit which allows our clinical staff to assess your need for preventative services including immunizations, lifestyle education, counseling to decrease risk of preventable diseases and screening for fall risk and other medical concerns.    This visit is provided free of charge (no copay) for all Medicare recipients. The clinical pharmacists at [facility] have begun to conduct these Wellness Visits which will also include a thorough review of all your medications.    As you primary medical provider recommend that you make an appointment for your Annual Wellness Visit if you have not done so already this year.  You may set up this appointment before you leave today or you may call back [phone]) and schedule an appointment.  Please make sure when you call that you mention that you are scheduling your Annual Wellness Visit with the clinical pharmacist so that the appointment may be made for the proper length of time.     Continue current medications. Continue good therapeutic lifestyle changes which include good diet and exercise. Fall precautions discussed with patient. If an FOBT was given today- please return it to our front desk. If you are over 47 years old - you may need Prevnar 13 or the adult Pneumonia vaccine.   After your visit with [place] today you will receive a survey in the mail or online from [employer] regarding your care with [place]. Please take a moment to fill this out. Your feedback is very important to [place] as you can help [place] better understand your patient needs as well as  improve your experience and satisfaction. WE CARE ABOUT YOU!!!    the patient should use some warm wet compresses to the right lateral hip  She should take the antibiotic until it is completed 3 times daily  She should use nasal saline frequently through the day She should take the pain pill only if needed for severe pain for her hip She should continue to use ibuprofen twice daily after breakfast and supper She should use warm wet compresses to the right hip 20 minutes 3 or 4 times daily

## 2015-07-04 ENCOUNTER — Ambulatory Visit: Payer: Medicare Other | Admitting: Family Medicine

## 2015-07-04 ENCOUNTER — Other Ambulatory Visit: Payer: Self-pay | Admitting: *Deleted

## 2015-07-04 ENCOUNTER — Other Ambulatory Visit (INDEPENDENT_AMBULATORY_CARE_PROVIDER_SITE_OTHER): Payer: Medicare Other

## 2015-07-04 DIAGNOSIS — D649 Anemia, unspecified: Secondary | ICD-10-CM

## 2015-07-04 DIAGNOSIS — R710 Precipitous drop in hematocrit: Secondary | ICD-10-CM

## 2015-07-04 LAB — CBC WITH DIFFERENTIAL/PLATELET
Basophils Absolute: 0 10*3/uL (ref 0.0–0.2)
Basos: 1 %
EOS (ABSOLUTE): 0.2 10*3/uL (ref 0.0–0.4)
Eos: 4 %
Hematocrit: 26 % — ABNORMAL LOW (ref 34.0–46.6)
Hemoglobin: 7.8 g/dL — CL (ref 11.1–15.9)
Immature Grans (Abs): 0 10*3/uL (ref 0.0–0.1)
Immature Granulocytes: 0 %
Lymphocytes Absolute: 1.4 10*3/uL (ref 0.7–3.1)
Lymphs: 21 %
MCH: 20.9 pg — ABNORMAL LOW (ref 26.6–33.0)
MCHC: 30 g/dL — ABNORMAL LOW (ref 31.5–35.7)
MCV: 70 fL — ABNORMAL LOW (ref 79–97)
Monocytes Absolute: 0.7 10*3/uL (ref 0.1–0.9)
Monocytes: 10 %
Neutrophils Absolute: 4.3 10*3/uL (ref 1.4–7.0)
Neutrophils: 64 %
Platelets: 303 10*3/uL (ref 150–379)
RBC: 3.74 x10E6/uL — ABNORMAL LOW (ref 3.77–5.28)
RDW: 19.3 % — ABNORMAL HIGH (ref 12.3–15.4)
WBC: 6.6 10*3/uL (ref 3.4–10.8)

## 2015-07-04 LAB — BMP8+EGFR
BUN/Creatinine Ratio: 11 (ref 11–26)
BUN: 11 mg/dL (ref 8–27)
CO2: 19 mmol/L (ref 18–29)
Calcium: 8.2 mg/dL — ABNORMAL LOW (ref 8.7–10.3)
Chloride: 106 mmol/L (ref 97–108)
Creatinine, Ser: 0.98 mg/dL (ref 0.57–1.00)
GFR calc Af Amer: 72 mL/min/{1.73_m2} (ref >59)
GFR calc non Af Amer: 62 mL/min/{1.73_m2} (ref >59)
Glucose: 69 mg/dL (ref 65–99)
Potassium: 3.7 mmol/L (ref 3.5–5.2)
Sodium: 144 mmol/L (ref 134–144)

## 2015-07-04 LAB — HEPATIC FUNCTION PANEL
ALT: 10 IU/L (ref 0–32)
AST: 11 IU/L (ref 0–40)
Albumin: 3.6 g/dL (ref 3.6–4.8)
Alkaline Phosphatase: 268 IU/L — ABNORMAL HIGH (ref 39–117)
Bilirubin Total: <0.2 mg/dL (ref 0.0–1.2)
Bilirubin, Direct: 0.05 mg/dL (ref 0.00–0.40)
Total Protein: 5.8 g/dL — ABNORMAL LOW (ref 6.0–8.5)

## 2015-07-04 LAB — LIPID PANEL
Chol/HDL Ratio: 2.4 ratio units (ref 0.0–4.4)
Cholesterol, Total: 146 mg/dL (ref 100–199)
HDL: 60 mg/dL (ref >39)
LDL Calculated: 64 mg/dL (ref 0–99)
Triglycerides: 108 mg/dL (ref 0–149)
VLDL Cholesterol Cal: 22 mg/dL (ref 5–40)

## 2015-07-04 LAB — VITAMIN D 25 HYDROXY (VIT D DEFICIENCY, FRACTURES): Vit D, 25-Hydroxy: 11.2 ng/mL — ABNORMAL LOW (ref 30.0–100.0)

## 2015-07-04 MED ORDER — VITAMIN D (ERGOCALCIFEROL) 1.25 MG (50000 UNIT) PO CAPS: 50000.0000 [IU] | ORAL_CAPSULE | ORAL | Status: DC

## 2015-07-04 NOTE — Progress Notes (Signed)
Lab only 

## 2015-07-04 NOTE — Addendum Note (Signed)
Addended by: Prescott Gum on: 07/04/2015 04:31 PM   Modules accepted: Orders

## 2015-07-04 NOTE — Addendum Note (Signed)
Addended by: Prescott Gum on: 07/04/2015 04:28 PM   Modules accepted: Orders

## 2015-07-05 ENCOUNTER — Telehealth: Payer: Self-pay | Admitting: Family Medicine

## 2015-07-05 LAB — CBC WITH DIFFERENTIAL/PLATELET
Basophils Absolute: 0 10*3/uL (ref 0.0–0.2)
Basos: 0 %
EOS (ABSOLUTE): 0.3 10*3/uL (ref 0.0–0.4)
Eos: 3 %
Hematocrit: 25.5 % — ABNORMAL LOW (ref 34.0–46.6)
Hemoglobin: 7.9 g/dL — CL (ref 11.1–15.9)
Immature Grans (Abs): 0 10*3/uL (ref 0.0–0.1)
Immature Granulocytes: 0 %
Lymphocytes Absolute: 1.3 10*3/uL (ref 0.7–3.1)
Lymphs: 17 %
MCH: 21.1 pg — ABNORMAL LOW (ref 26.6–33.0)
MCHC: 31 g/dL — ABNORMAL LOW (ref 31.5–35.7)
MCV: 68 fL — ABNORMAL LOW (ref 79–97)
Monocytes Absolute: 0.6 10*3/uL (ref 0.1–0.9)
Monocytes: 8 %
Neutrophils Absolute: 5.5 10*3/uL (ref 1.4–7.0)
Neutrophils: 72 %
Platelets: 304 10*3/uL (ref 150–379)
RBC: 3.75 x10E6/uL — ABNORMAL LOW (ref 3.77–5.28)
RDW: 19.1 % — ABNORMAL HIGH (ref 12.3–15.4)
WBC: 7.6 10*3/uL (ref 3.4–10.8)

## 2015-07-05 LAB — IRON AND TIBC
Iron Saturation: 4 % — CL (ref 15–55)
Iron: 12 ug/dL — ABNORMAL LOW (ref 27–139)
Total Iron Binding Capacity: 304 ug/dL (ref 250–450)
UIBC: 292 ug/dL (ref 118–369)

## 2015-07-05 LAB — FOLATE: Folate: 3.6 ng/mL (ref >3.0)

## 2015-07-05 LAB — FERRITIN: Ferritin: 6 ng/mL — ABNORMAL LOW (ref 15–150)

## 2015-07-06 NOTE — Telephone Encounter (Signed)
Patient aware of results.

## 2015-07-15 ENCOUNTER — Encounter: Payer: Self-pay | Admitting: Family Medicine

## 2015-07-15 ENCOUNTER — Ambulatory Visit (INDEPENDENT_AMBULATORY_CARE_PROVIDER_SITE_OTHER): Payer: Medicare Other

## 2015-07-15 ENCOUNTER — Ambulatory Visit (INDEPENDENT_AMBULATORY_CARE_PROVIDER_SITE_OTHER): Payer: Medicare Other | Admitting: Family Medicine

## 2015-07-15 VITALS — BP 135/81 | HR 95 | Temp 99.1°F | Ht 66.0 in | Wt 160.8 lb

## 2015-07-15 DIAGNOSIS — D509 Iron deficiency anemia, unspecified: Secondary | ICD-10-CM | POA: Diagnosis not present

## 2015-07-15 DIAGNOSIS — M25551 Pain in right hip: Secondary | ICD-10-CM

## 2015-07-15 DIAGNOSIS — M7989 Other specified soft tissue disorders: Secondary | ICD-10-CM

## 2015-07-15 MED ORDER — HYDROCODONE-ACETAMINOPHEN 5-325 MG PO TABS: 1.0000 | ORAL_TABLET | Freq: Four times a day (QID) | ORAL | Status: DC | PRN

## 2015-07-15 NOTE — Progress Notes (Signed)
   HPI  Patient presents today to follow up for hip pain and new complaint of RLE swelling and ankle pain   She reports continued R hip pain starting in anterior hip radiating to just abover her knee Sharp pain Worse with standing and improved with hydrocodone and heat NSAIDs not helping Taking 800 ibuprofen every 5 hours, Explained no more than Q8 hours  No dyspnea, chest pain, or cough  Ankle pain and swelling 4-5 days of RLE swelling and medial ankle pain No injury/trauma Helped by norco No recent surgery or traveling, taking care of a 20 month old baby now at home + Hx of phlebitis but no clots  Anemia See's hematology, low iron and Hgb, stable at last check.    Requests more norco  PMH: Smoking status noted ROS: Per HPI, otherwiose negative  Objective: BP 135/81 mmHg  Pulse 95  Temp(Src) 99.1 F (37.3 C) (Oral)  Ht  (1.676 m)  Wt 160 lb 12.8 oz (72.938 kg)  BMI 25.97 kg/m2 Gen: NAD, alert, cooperative with exam HEENT: NCAT CV: RRR, good S1/S2, no murmur Resp: CTABL, no wheezes, non-labored Ext: gross edema on R vs L, 1+ pitting on R, circumference 35 cm BL Neuro: Alert and oriented, No gross deficits Msk: R hip, no pain with FABER, no groin pain with rotation No pain to palp over greatertroch  R ankle exqyuisite pain with anterior drawer Tenderness to palp of medial anklena dmedial calf Calf circumference 35 cm BL   Xrays: DG ankle: no acute fracture, await radiology read DG R hip: changes of OA with sclerosis and joint space narrowing, no fracture  Assessment and plan:  # R hip pain C/w strained muscles, continue heat and NSAIDs, caustion on NSAIDs and her current dose Refill norco (has 2 pills left from original bottle Plain film, consider ortho if continued in 2 weeks  # RLE swelling, ankle pain Pain with ant drawer, Concern for occult fracture PLain film Rule out DVT with edema- venous US     Orders Placed This Encounter    Procedures  . DG Ankle Complete Right    Standing Status: Future     Number of Occurrences: 1     Standing Expiration Date: 09/13/2016    Order Specific Question:  Reason for Exam (SYMPTOM  OR DIAGNOSIS REQUIRED)    Answer:  R hip pain    Order Specific Question:  Preferred imaging location?    Answer:  Internal  . DG HIP UNILAT W OR W/O PELVIS 2-3 VIEWS RIGHT    Standing Status: Future     Number of Occurrences: 1     Standing Expiration Date: 09/12/2016    Order Specific Question:  Reason for Exam (SYMPTOM  OR DIAGNOSIS REQUIRED)    Answer:  R hip pain    Order Specific Question:  Preferred imaging location?    Answer:  Internal  . CBC with Differential    Meds ordered this encounter  Medications  . HYDROcodone-acetaminophen (NORCO) 5-325 MG per tablet    Sig: Take 1 tablet by mouth every 6 (six) hours as needed for moderate pain.    Dispense:  14 tablet    Refill:  0    Murtis Sink, MD [place] [facility] 07/15/2015, 4:49 PM

## 2015-07-15 NOTE — Patient Instructions (Signed)
Great to meet you!  If your swelling gets worse come in right away  If your pain persists come back in 2 weeks.   We will call with your radiology reports

## 2015-07-16 LAB — CBC WITH DIFFERENTIAL/PLATELET
Basophils Absolute: 0.1 10*3/uL (ref 0.0–0.2)
Basos: 1 %
EOS (ABSOLUTE): 0.3 10*3/uL (ref 0.0–0.4)
Eos: 4 %
Hematocrit: 27.1 % — ABNORMAL LOW (ref 34.0–46.6)
Hemoglobin: 8.1 g/dL — CL (ref 11.1–15.9)
Immature Grans (Abs): 0 10*3/uL (ref 0.0–0.1)
Immature Granulocytes: 0 %
Lymphocytes Absolute: 1.3 10*3/uL (ref 0.7–3.1)
Lymphs: 19 %
MCH: 21.1 pg — ABNORMAL LOW (ref 26.6–33.0)
MCHC: 29.9 g/dL — ABNORMAL LOW (ref 31.5–35.7)
MCV: 71 fL — ABNORMAL LOW (ref 79–97)
Monocytes Absolute: 0.7 10*3/uL (ref 0.1–0.9)
Monocytes: 9 %
Neutrophils Absolute: 4.7 10*3/uL (ref 1.4–7.0)
Neutrophils: 67 %
Platelets: 347 10*3/uL (ref 150–379)
RBC: 3.83 x10E6/uL (ref 3.77–5.28)
RDW: 19.3 % — ABNORMAL HIGH (ref 12.3–15.4)
WBC: 7 10*3/uL (ref 3.4–10.8)

## 2015-07-19 ENCOUNTER — Telehealth: Payer: Self-pay | Admitting: Family Medicine

## 2015-07-19 MED ORDER — FLUCONAZOLE 150 MG PO TABS: ORAL_TABLET | ORAL | Status: DC

## 2015-07-19 NOTE — Telephone Encounter (Signed)
Please call prescription in for Diflucan 150 #2, one stat and one 1 week later

## 2015-07-19 NOTE — Telephone Encounter (Signed)
Rx sent to pharmacy   

## 2015-07-24 ENCOUNTER — Other Ambulatory Visit: Payer: Self-pay | Admitting: Family Medicine

## 2015-08-19 ENCOUNTER — Telehealth: Payer: Self-pay | Admitting: Family Medicine

## 2015-08-19 NOTE — Telephone Encounter (Signed)
Pt given appt tomorrow with Jannifer Rodney at 3:40.

## 2015-08-20 ENCOUNTER — Encounter: Payer: Self-pay | Admitting: Family

## 2015-08-20 ENCOUNTER — Ambulatory Visit (INDEPENDENT_AMBULATORY_CARE_PROVIDER_SITE_OTHER): Payer: Medicare Other | Admitting: Family

## 2015-08-20 VITALS — BP 139/84 | HR 98 | Temp 98.3°F | Ht 66.0 in | Wt 156.8 lb

## 2015-08-20 DIAGNOSIS — N3000 Acute cystitis without hematuria: Secondary | ICD-10-CM

## 2015-08-20 DIAGNOSIS — R300 Dysuria: Secondary | ICD-10-CM

## 2015-08-20 LAB — POCT URINALYSIS DIPSTICK
Bilirubin, UA: NEGATIVE
Blood, UA: NEGATIVE
Glucose, UA: NEGATIVE
Ketones, UA: NEGATIVE
Nitrite, UA: POSITIVE
Protein, UA: NEGATIVE
Spec Grav, UA: 1.01
Urobilinogen, UA: NEGATIVE
pH, UA: 6

## 2015-08-20 LAB — POCT UA - MICROSCOPIC ONLY
Casts, Ur, LPF, POC: NEGATIVE
Crystals, Ur, HPF, POC: NEGATIVE
Mucus, UA: NEGATIVE
RBC, urine, microscopic: NEGATIVE
Yeast, UA: NEGATIVE

## 2015-08-20 MED ORDER — CIPROFLOXACIN HCL 500 MG PO TABS: 500.0000 mg | ORAL_TABLET | Freq: Two times a day (BID) | ORAL | Status: DC

## 2015-08-20 MED ORDER — NYSTATIN 100000 UNIT/ML MT SUSP: 5.0000 mL | Freq: Four times a day (QID) | OROMUCOSAL | Status: DC

## 2015-08-20 NOTE — Patient Instructions (Signed)

## 2015-08-20 NOTE — Addendum Note (Signed)
Addended by: Jannifer Rodney A on: 08/20/2015 04:16 PM   Modules accepted: Orders

## 2015-08-20 NOTE — Progress Notes (Signed)
   Subjective:    Patient ID: Erin Donaldson, female    DOB: 1951-10-29, 64 y.o.   MRN: [id number]  Dysuria  This is a new problem. The current episode started in the past 7 days. The problem occurs every urination. The problem has been gradually worsening. The quality of the pain is described as burning. The pain is at a severity of 6/10. The pain is mild. There has been no fever. Associated symptoms include frequency, hesitancy and urgency. Pertinent negatives include no chills, discharge, flank pain, hematuria, nausea or vomiting. She has tried increased fluids for the symptoms. The treatment provided mild relief.      Review of Systems  Constitutional: Negative.  Negative for chills.  HENT: Negative.   Eyes: Negative.   Respiratory: Negative.  Negative for shortness of breath.   Cardiovascular: Negative.  Negative for palpitations.  Gastrointestinal: Negative.  Negative for nausea and vomiting.  Endocrine: Negative.   Genitourinary: Positive for dysuria, hesitancy, urgency and frequency. Negative for hematuria and flank pain.  Musculoskeletal: Negative.   Neurological: Negative.  Negative for headaches.  Hematological: Negative.   Psychiatric/Behavioral: Negative.   All other systems reviewed and are negative.      Objective:   Physical Exam  Constitutional: She is oriented to person, place, and time. She appears well-developed and well-nourished. No distress.  HENT:  Head: Normocephalic and atraumatic.  Eyes: Pupils are equal, round, and reactive to light.  Neck: Normal range of motion. Neck supple. No thyromegaly present.  Cardiovascular: Normal rate, regular rhythm, normal heart sounds and intact distal pulses.   No murmur heard. Pulmonary/Chest: Effort normal and breath sounds normal. No respiratory distress. She has no wheezes.  Abdominal: Soft. Bowel sounds are normal. She exhibits no distension. There is no tenderness.  Musculoskeletal: Normal range of motion. She  exhibits no edema or tenderness.  Negative for CVA tenderness   Neurological: She is alert and oriented to person, place, and time. She has normal reflexes. No cranial nerve deficit.  Skin: Skin is warm and dry.  Psychiatric: She has a normal mood and affect. Her behavior is normal. Judgment and thought content normal.  Vitals reviewed.   BP 139/84 mmHg  Pulse 98  Temp(Src) 98.3 F (36.8 C) (Oral)  Ht  (1.676 m)  Wt 156 lb 12.8 oz (71.124 kg)  BMI 25.32 kg/m2       Assessment & Plan:  1. Dysuria - POCT urinalysis dipstick - POCT UA - Microscopic Only  2. Acute cystitis without hematuria -Force fluids AZO over the counter X2 days RTO prn Culture pending - ciprofloxacin (CIPRO) 500 MG tablet; Take 1 tablet (500 mg total) by mouth 2 (two) times daily.  Dispense: 10 tablet; Refill: 0  Jannifer Rodney, FNP

## 2015-08-26 LAB — URINE CULTURE

## 2015-10-29 ENCOUNTER — Other Ambulatory Visit: Payer: Self-pay | Admitting: Family Medicine

## 2015-11-08 ENCOUNTER — Other Ambulatory Visit: Payer: Self-pay | Admitting: Family Medicine

## 2015-11-20 ENCOUNTER — Encounter: Payer: Self-pay | Admitting: Family Medicine

## 2015-11-20 ENCOUNTER — Ambulatory Visit (INDEPENDENT_AMBULATORY_CARE_PROVIDER_SITE_OTHER): Payer: Medicare Other | Admitting: Family Medicine

## 2015-11-20 VITALS — BP 99/66 | HR 93 | Temp 97.8°F | Ht 66.0 in | Wt 163.0 lb

## 2015-11-20 DIAGNOSIS — K219 Gastro-esophageal reflux disease without esophagitis: Secondary | ICD-10-CM | POA: Diagnosis not present

## 2015-11-20 DIAGNOSIS — E1169 Type 2 diabetes mellitus with other specified complication: Secondary | ICD-10-CM

## 2015-11-20 DIAGNOSIS — E785 Hyperlipidemia, unspecified: Secondary | ICD-10-CM

## 2015-11-20 DIAGNOSIS — F319 Bipolar disorder, unspecified: Secondary | ICD-10-CM | POA: Diagnosis not present

## 2015-11-20 DIAGNOSIS — D649 Anemia, unspecified: Secondary | ICD-10-CM | POA: Diagnosis not present

## 2015-11-20 DIAGNOSIS — E559 Vitamin D deficiency, unspecified: Secondary | ICD-10-CM

## 2015-11-20 DIAGNOSIS — I10 Essential (primary) hypertension: Secondary | ICD-10-CM

## 2015-11-20 DIAGNOSIS — E118 Type 2 diabetes mellitus with unspecified complications: Secondary | ICD-10-CM | POA: Diagnosis not present

## 2015-11-20 DIAGNOSIS — J302 Other seasonal allergic rhinitis: Secondary | ICD-10-CM | POA: Diagnosis not present

## 2015-11-20 LAB — POCT HEMOGLOBIN: Hemoglobin: 8.2 g/dL — AB (ref 12.2–16.2)

## 2015-11-20 MED ORDER — FLUTICASONE PROPIONATE 50 MCG/ACT NA SUSP: 2.0000 | Freq: Every day | NASAL | Status: DC

## 2015-11-20 NOTE — Addendum Note (Signed)
Addended by: Magdalene RiverBULLINS, Zorion Nims H on: 11/20/2015 04:49 PM   Modules accepted: Orders

## 2015-11-20 NOTE — Progress Notes (Signed)
Subjective:    Patient ID: Erin Donaldson, female    DOB: 09/18/1951, 65 y.o.   MRN: [id number]  HPI Pt here for follow up and management of chronic medical problems which includes diabetes. She is taking medications regularly. The patient continues to see her psychiatrist regularly. She plans to see her hematologist soon and will call and make an arrangement to see him because of her history of low iron and low hemoglobin. The patient denies chest pain shortness of breath trouble swallowing and heartburn indigestion and nausea vomiting diarrhea or blood in the stool. She does have constipation and take something every couple days to help relieve this and this is over-the-counter. She has less stress on her now and does not have a great grandchild to look after. She is passing her water without problems. She is questioning why she has a diagnosis of diabetes and this certainly could be changes most likely due to prediabetes.      Patient Active Problem List   Diagnosis Date Noted  . Swelling of right lower extremity 07/15/2015  . Right hip pain 07/15/2015  . Microcytic anemia 07/15/2015  . Elevated LFTs   . History of gastric bypass 01/15/2014  . Amnestic disorder due to medical condition 08/23/2013  . Diarrhea 07/19/2013  . BIPOLAR AFFECTIVE DISORDER 08/26/2009  . ANXIETY 08/26/2009  . MIGRAINE HEADACHE 08/26/2009  . DIABETES MELLITUS, TYPE II 08/15/2009  . HYPERLIPIDEMIA 08/15/2009  . HYPERTENSION 08/15/2009  . GERD 08/15/2009  . CONSTIPATION 08/15/2009  . IRRITABLE BOWEL SYNDROME 08/15/2009   Outpatient Encounter Prescriptions as of 11/20/2015  Medication Sig  . cyclobenzaprine (FLEXERIL) 10 MG tablet TAKE 1/2 TO 1 TAB TWICE A DAY AS DIRECTED  . donepezil (ARICEPT) 10 MG tablet TAKE 1 TABLET (10 MG TOTAL) BY MOUTH DAILY.  . famotidine (PEPCID) 20 MG tablet TAKE 1 TABLET (20 MG TOTAL) BY MOUTH 2 (TWO) TIMES DAILY.  . fluticasone (FLONASE) 50 MCG/ACT nasal spray USE 1 SPRAY IN  EACH NOSTRIL DAILY  . furosemide (LASIX) 20 MG tablet TAKE 1 TABLET (20 MG TOTAL) BY MOUTH DAILY.  Marland Kitchen gabapentin (NEURONTIN) 300 MG capsule TAKE 3 CAPSULE BY MOUTH THREE TIMES A DAY  . HYDROcodone-acetaminophen (NORCO) 5-325 MG per tablet Take 1 tablet by mouth every 6 (six) hours as needed for moderate pain.  Marland Kitchen lamoTRIgine (LAMICTAL) 200 MG tablet Take 200 mg by mouth daily.  . meclizine (ANTIVERT) 12.5 MG tablet Take 1 tablet (12.5 mg total) by mouth 3 (three) times daily as needed for dizziness.  . methocarbamol (ROBAXIN) 750 MG tablet TAKE 1 TABLET (750 MG TOTAL) BY MOUTH 4 (FOUR) TIMES DAILY.  Marland Kitchen nystatin (MYCOSTATIN) 100000 UNIT/ML suspension Take 5 mLs (500,000 Units total) by mouth 4 (four) times daily.  Marland Kitchen omeprazole (PRILOSEC) 40 MG capsule TAKE 2 CAPSULES (80 MG TOTAL) BY MOUTH DAILY.  Marland Kitchen QUEtiapine (SEROQUEL) 200 MG tablet Take 200 mg by mouth daily.  . QUEtiapine (SEROQUEL) 400 MG tablet Take 800 mg by mouth at bedtime.   . rizatriptan (MAXALT) 10 MG tablet Take 1 tablet (10 mg total) by mouth as needed. May repeat in 2 hours if needed  . Vitamin D, Ergocalciferol, (DRISDOL) 50000 UNITS CAPS capsule Take 1 capsule (50,000 Units total) by mouth every 7 (seven) days.  . [DISCONTINUED] ciprofloxacin (CIPRO) 500 MG tablet Take 1 tablet (500 mg total) by mouth 2 (two) times daily.  . [DISCONTINUED] gabapentin (NEURONTIN) 300 MG capsule TAKE 3 CAPSULE BY MOUTH THREE TIMES A DAY  No facility-administered encounter medications on file as of 11/20/2015.      Review of Systems  Constitutional: Negative.   HENT: Positive for postnasal drip and sinus pressure.   Eyes: Negative.   Respiratory: Negative.   Cardiovascular: Negative.   Gastrointestinal: Negative.   Endocrine: Negative.   Genitourinary: Negative.   Musculoskeletal: Negative.   Skin: Negative.   Allergic/Immunologic: Negative.   Neurological: Positive for headaches.  Hematological: Negative.   Psychiatric/Behavioral:  Negative.        Objective:   Physical Exam  Constitutional: She is oriented to person, place, and time. She appears well-developed and well-nourished. No distress.  HENT:  Head: Normocephalic and atraumatic.  Right Ear: External ear normal.  Left Ear: External ear normal.  Mouth/Throat: Oropharynx is clear and moist.  Nasal congestion bilaterally  Eyes: Conjunctivae and EOM are normal. Pupils are equal, round, and reactive to light. Right eye exhibits no discharge. Left eye exhibits no discharge. No scleral icterus.  Neck: Normal range of motion. Neck supple. No thyromegaly present.  Cardiovascular: Normal rate, regular rhythm, normal heart sounds and intact distal pulses.   No murmur heard. Heart is regular at 84/m  Pulmonary/Chest: Effort normal and breath sounds normal. No respiratory distress. She has no wheezes. She has no rales. She exhibits no tenderness.  Clear anteriorly and posteriorly  Abdominal: Soft. Bowel sounds are normal. She exhibits no mass. There is no tenderness. There is no rebound and no guarding.  Nontender without liver or spleen enlargement and no masses  Musculoskeletal: Normal range of motion. She exhibits no edema or tenderness.  Lymphadenopathy:    She has no cervical adenopathy.  Neurological: She is alert and oriented to person, place, and time. She has normal reflexes. No cranial nerve deficit.  Skin: Skin is warm and dry. No rash noted.  Psychiatric: She has a normal mood and affect. Her behavior is normal. Judgment and thought content normal.  The patient appears more relaxed today and less stressed.  Nursing note and vitals reviewed.   BP 99/66 mmHg  Pulse 93  Temp(Src) 97.8 F (36.6 C) (Oral)  Ht 5' 6" (1.676 m)  Wt 163 lb (73.936 kg)  BMI 26.32 kg/m2 Results for orders placed or performed in visit on 11/20/15  POCT hemoglobin  Result Value Ref Range   Hemoglobin 8.2 (A) 12.2 - 16.2 g/dL         Assessment & Plan:  1.  Gastroesophageal reflux disease, esophagitis presence not specified -Continue Pepcid as doing - CBC with Differential/Platelet; Future - Hepatic function panel; Future - Lipid panel; Future  2. Vitamin D deficiency -Continue vitamin D replacement pending results of lab work - CBC with Differential/Platelet; Future - VITAMIN D 25 Hydroxy (Vit-D Deficiency, Fractures); Future  3. Type 2 diabetes mellitus with complication, without long-term current use of insulin (HCC) -This patient probably does not have diabetes anymore because of her gastric bypass and only has prediabetes based on the last in the glove and A1c which was 6.0. - BMP8+EGFR; Future - CBC with Differential/Platelet; Future - POCT glycosylated hemoglobin (Hb A1C); Future - Hepatic function panel; Future - Lipid panel; Future  4. Low hemoglobin -She has had chronically low hemoglobins and in the past has had to have IV iron replacement. She plans to make an appointment with her hematologist to follow this up and will be given another FOBT to return. The previous FOBT was negative. - POCT hemoglobin - CBC with Differential/Platelet; Future  5. Essential hypertension -Blood  pressure is good today and she will continue with her current treatment  6. Hyperlipidemia -Continue with aggressive therapeutic lifestyle changes  7. Type 2 diabetes mellitus with hyperlipidemia (HCC) -Continue with aggressive therapeutic lifestyle changes  8. BIPOLAR AFFECTIVE DISORDER -Continue follow-up with psychiatrist  9. Other seasonal allergic rhinitis -Use Flonase as directed and nasal saline through the day  Meds ordered this encounter  Medications  . fluticasone (FLONASE) 50 MCG/ACT nasal spray    Sig: Place 2 sprays into both nostrils daily.    Dispense:  16 g    Refill:  6   Patient Instructions                       Medicare Annual Wellness Visit  [place] and the medical providers at [facility]  strive to bring you the best medical care.  In doing so we not only want to address your current medical conditions and concerns but also to detect new conditions early and prevent illness, disease and health-related problems.    Medicare offers a yearly Wellness Visit which allows our clinical staff to assess your need for preventative services including immunizations, lifestyle education, counseling to decrease risk of preventable diseases and screening for fall risk and other medical concerns.    This visit is provided free of charge (no copay) for all Medicare recipients. The clinical pharmacists at [facility] have begun to conduct these Wellness Visits which will also include a thorough review of all your medications.    As you primary medical provider recommend that you make an appointment for your Annual Wellness Visit if you have not done so already this year.  You may set up this appointment before you leave today or you may call back ([phone]) and schedule an appointment.  Please make sure when you call that you mention that you are scheduling your Annual Wellness Visit with the clinical pharmacist so that the appointment may be made for the proper length of time.     Continue current medications. Continue good therapeutic lifestyle changes which include good diet and exercise. Fall precautions discussed with patient. If an FOBT was given today- please return it to our front desk. If you are over 50 years old - you may need Prevnar 13 or the adult Pneumonia vaccine.  **Flu shots are available--- please call and schedule a FLU-CLINIC appointment**  After your visit with us today you will receive a survey in the mail or online from [employer] regarding your care with us. Please take a moment to fill this out. Your feedback is very important to us as you can help us better understand your patient needs as well as improve your experience and satisfaction. WE CARE  ABOUT YOU!!!   Follow-up with Dr. Cottle as planned The patient is to make an appointment with the hematologist that she is seen in the past and she will make sure that she takes a copy of the blood work from our office to that visit She should get her mammograms regularly because of her family history of breast cancer After checking the blood work this visit if the hemoglobin A1c is in the prediabetic range as it has been for a good while we will change her diagnosis to prediabetes. She will return the FOBT She will make sure that she gets her eyes examined at least every 2 years She will use nasal saline frequently through the day and drink plenty of   fluids She will use the Flonase 1-2 sprays at nighttime     Arrie Senate MD

## 2015-11-20 NOTE — Patient Instructions (Addendum)
Medicare Annual Wellness Visit  [place] and the medical providers at [facility] strive to bring you the best medical care.  In doing so we not only want to address your current medical conditions and concerns but also to detect new conditions early and prevent illness, disease and health-related problems.    Medicare offers a yearly Wellness Visit which allows our clinical staff to assess your need for preventative services including immunizations, lifestyle education, counseling to decrease risk of preventable diseases and screening for fall risk and other medical concerns.    This visit is provided free of charge (no copay) for all Medicare recipients. [facility] have begun to conduct these Wellness Visits which will also include a thorough review of all your medications.    As you primary medical provider recommend that you make an appointment for your Annual Wellness Visit if you have not done so already this year.  You may set up this appointment before you leave today or you may call back [phone] and schedule an appointment.  Please make sure when you call that you mention that you are scheduling your Annual Wellness Visit with the clinical pharmacist so that the appointment may be made for the proper length of time.     Continue current medications. Continue good therapeutic lifestyle changes which include good diet and exercise. Fall precautions discussed with patient. If an FOBT was given today- please return it to our front desk. If you are over 63 years old - you may need Prevnar 13 or the adult Pneumonia vaccine.  **Flu shots are available--- please call and schedule a FLU-CLINIC appointment**  After your visit with us today you will receive a survey in the mail or online from [employer] regarding your care with us. Please take a moment to fill this out. Your feedback is very  important to us as you can help us better understand your patient needs as well as improve your experience and satisfaction. WE CARE ABOUT YOU!!!   Follow-up with Dr. Jennelle Humanottle as planned The patient is to make an appointment with the hematologist that she is seen in the past and she will make sure that she takes a copy of the blood work from our office to that visit She should get her mammograms regularly because of her family history of breast cancer After checking the blood work this visit if the hemoglobin A1c is in the prediabetic range as it has been for a good while we will change her diagnosis to prediabetes. She will return the FOBT She will make sure that she gets her eyes examined at least every 2 years She will use nasal saline frequently through the day and drink plenty of fluids She will use the Flonase 1-2 sprays at nighttime

## 2015-12-03 ENCOUNTER — Other Ambulatory Visit (INDEPENDENT_AMBULATORY_CARE_PROVIDER_SITE_OTHER): Payer: Medicare Other

## 2015-12-03 DIAGNOSIS — K219 Gastro-esophageal reflux disease without esophagitis: Secondary | ICD-10-CM

## 2015-12-03 DIAGNOSIS — E559 Vitamin D deficiency, unspecified: Secondary | ICD-10-CM

## 2015-12-03 DIAGNOSIS — E118 Type 2 diabetes mellitus with unspecified complications: Secondary | ICD-10-CM | POA: Diagnosis not present

## 2015-12-03 DIAGNOSIS — D649 Anemia, unspecified: Secondary | ICD-10-CM

## 2015-12-03 LAB — POCT GLYCOSYLATED HEMOGLOBIN (HGB A1C): Hemoglobin A1C: 6.3

## 2015-12-03 NOTE — Addendum Note (Signed)
Addended by: Orma Render F on: 12/03/2015 11:44 AM   Modules accepted: Orders

## 2015-12-03 NOTE — Progress Notes (Signed)
Lab only 

## 2015-12-03 NOTE — Addendum Note (Signed)
Addended by: Orma Render F on: 12/03/2015 11:36 AM   Modules accepted: Orders

## 2015-12-04 ENCOUNTER — Encounter: Payer: Self-pay | Admitting: *Deleted

## 2015-12-04 ENCOUNTER — Other Ambulatory Visit: Payer: Self-pay | Admitting: Family Medicine

## 2015-12-04 LAB — CBC WITH DIFFERENTIAL/PLATELET
Basophils Absolute: 0 10*3/uL (ref 0.0–0.2)
Basos: 1 %
EOS (ABSOLUTE): 0.2 10*3/uL (ref 0.0–0.4)
Eos: 3 %
Hematocrit: 26.8 % — ABNORMAL LOW (ref 34.0–46.6)
Hemoglobin: 7.9 g/dL — CL (ref 11.1–15.9)
Immature Grans (Abs): 0 10*3/uL (ref 0.0–0.1)
Immature Granulocytes: 0 %
Lymphocytes Absolute: 1.3 10*3/uL (ref 0.7–3.1)
Lymphs: 22 %
MCH: 19.7 pg — ABNORMAL LOW (ref 26.6–33.0)
MCHC: 29.5 g/dL — ABNORMAL LOW (ref 31.5–35.7)
MCV: 67 fL — ABNORMAL LOW (ref 79–97)
Monocytes Absolute: 0.5 10*3/uL (ref 0.1–0.9)
Monocytes: 8 %
Neutrophils Absolute: 4 10*3/uL (ref 1.4–7.0)
Neutrophils: 66 %
Platelets: 326 10*3/uL (ref 150–379)
RBC: 4.01 x10E6/uL (ref 3.77–5.28)
RDW: 16.7 % — ABNORMAL HIGH (ref 12.3–15.4)
WBC: 6 10*3/uL (ref 3.4–10.8)

## 2015-12-04 LAB — HEPATIC FUNCTION PANEL
ALT: 9 IU/L (ref 0–32)
AST: 17 IU/L (ref 0–40)
Albumin: 3.8 g/dL (ref 3.6–4.8)
Alkaline Phosphatase: 239 IU/L — ABNORMAL HIGH (ref 39–117)
Bilirubin Total: <0.2 mg/dL (ref 0.0–1.2)
Bilirubin, Direct: 0.07 mg/dL (ref 0.00–0.40)
Total Protein: 6.2 g/dL (ref 6.0–8.5)

## 2015-12-04 LAB — BMP8+EGFR
BUN/Creatinine Ratio: 14 (ref 11–26)
BUN: 14 mg/dL (ref 8–27)
CO2: 23 mmol/L (ref 18–29)
Calcium: 8.8 mg/dL (ref 8.7–10.3)
Chloride: 102 mmol/L (ref 96–106)
Creatinine, Ser: 1 mg/dL (ref 0.57–1.00)
GFR calc Af Amer: 70 mL/min/{1.73_m2} (ref >59)
GFR calc non Af Amer: 61 mL/min/{1.73_m2} (ref >59)
Glucose: 132 mg/dL — ABNORMAL HIGH (ref 65–99)
Potassium: 5 mmol/L (ref 3.5–5.2)
Sodium: 140 mmol/L (ref 134–144)

## 2015-12-04 LAB — LIPID PANEL
Chol/HDL Ratio: 2.5 ratio units (ref 0.0–4.4)
Cholesterol, Total: 172 mg/dL (ref 100–199)
HDL: 68 mg/dL (ref >39)
LDL Calculated: 85 mg/dL (ref 0–99)
Triglycerides: 94 mg/dL (ref 0–149)
VLDL Cholesterol Cal: 19 mg/dL (ref 5–40)

## 2015-12-04 LAB — VITAMIN D 25 HYDROXY (VIT D DEFICIENCY, FRACTURES): Vit D, 25-Hydroxy: <4 ng/mL — ABNORMAL LOW (ref 30.0–100.0)

## 2015-12-04 LAB — IRON AND TIBC
Iron Saturation: 3 % — CL (ref 15–55)
Iron: 14 ug/dL — ABNORMAL LOW (ref 27–139)
Total Iron Binding Capacity: 409 ug/dL (ref 250–450)
UIBC: 395 ug/dL — ABNORMAL HIGH (ref 118–369)

## 2015-12-04 LAB — FERRITIN: Ferritin: 4 ng/mL — ABNORMAL LOW (ref 15–150)

## 2015-12-04 LAB — VITAMIN B12: Vitamin B-12: 257 pg/mL (ref 211–946)

## 2016-01-02 ENCOUNTER — Telehealth: Payer: Self-pay | Admitting: Family Medicine

## 2016-01-06 ENCOUNTER — Other Ambulatory Visit: Payer: Self-pay

## 2016-01-13 ENCOUNTER — Other Ambulatory Visit: Payer: Medicare Other

## 2016-01-13 ENCOUNTER — Ambulatory Visit: Payer: Medicare Other

## 2016-01-13 ENCOUNTER — Ambulatory Visit: Payer: Medicare Other | Admitting: Family

## 2016-01-16 ENCOUNTER — Ambulatory Visit: Payer: Medicare Other | Admitting: Pharmacist

## 2016-01-23 ENCOUNTER — Other Ambulatory Visit: Payer: Self-pay | Admitting: *Deleted

## 2016-01-23 DIAGNOSIS — D509 Iron deficiency anemia, unspecified: Secondary | ICD-10-CM

## 2016-01-24 ENCOUNTER — Other Ambulatory Visit (HOSPITAL_BASED_OUTPATIENT_CLINIC_OR_DEPARTMENT_OTHER): Payer: Medicare Other

## 2016-01-24 ENCOUNTER — Ambulatory Visit (HOSPITAL_BASED_OUTPATIENT_CLINIC_OR_DEPARTMENT_OTHER): Payer: Medicare Other | Admitting: Family

## 2016-01-24 ENCOUNTER — Encounter: Payer: Self-pay | Admitting: Family

## 2016-01-24 ENCOUNTER — Ambulatory Visit (HOSPITAL_BASED_OUTPATIENT_CLINIC_OR_DEPARTMENT_OTHER): Payer: Medicare Other

## 2016-01-24 ENCOUNTER — Telehealth: Payer: Self-pay | Admitting: *Deleted

## 2016-01-24 VITALS — BP 143/70 | HR 81 | Temp 98.2°F | Resp 18

## 2016-01-24 VITALS — BP 151/70 | HR 87 | Temp 98.1°F | Resp 16 | Ht 66.0 in | Wt 171.0 lb

## 2016-01-24 DIAGNOSIS — D509 Iron deficiency anemia, unspecified: Secondary | ICD-10-CM

## 2016-01-24 DIAGNOSIS — R0609 Other forms of dyspnea: Secondary | ICD-10-CM | POA: Diagnosis not present

## 2016-01-24 DIAGNOSIS — K909 Intestinal malabsorption, unspecified: Secondary | ICD-10-CM | POA: Diagnosis not present

## 2016-01-24 DIAGNOSIS — Z9884 Bariatric surgery status: Secondary | ICD-10-CM

## 2016-01-24 LAB — COMPREHENSIVE METABOLIC PANEL
ALT: <9 U/L (ref 0–55)
AST: 16 U/L (ref 5–34)
Albumin: 3.5 g/dL (ref 3.5–5.0)
Alkaline Phosphatase: 302 U/L — ABNORMAL HIGH (ref 40–150)
Anion Gap: 10 mEq/L (ref 3–11)
BUN: 15.4 mg/dL (ref 7.0–26.0)
CO2: 21 mEq/L — ABNORMAL LOW (ref 22–29)
Calcium: 8.7 mg/dL (ref 8.4–10.4)
Chloride: 109 mEq/L (ref 98–109)
Creatinine: 1 mg/dL (ref 0.6–1.1)
EGFR: 59 mL/min/{1.73_m2} — ABNORMAL LOW (ref >90)
Glucose: 108 mg/dl (ref 70–140)
Potassium: 4.3 mEq/L (ref 3.5–5.1)
Sodium: 140 mEq/L (ref 136–145)
Total Bilirubin: <0.3 mg/dL (ref 0.20–1.20)
Total Protein: 7.1 g/dL (ref 6.4–8.3)

## 2016-01-24 LAB — CBC WITH DIFFERENTIAL (CANCER CENTER ONLY)
BASO#: 0.1 10*3/uL (ref 0.0–0.2)
BASO%: 0.6 % (ref 0.0–2.0)
EOS%: 3.3 % (ref 0.0–7.0)
Eosinophils Absolute: 0.3 10*3/uL (ref 0.0–0.5)
HCT: 24.5 % — ABNORMAL LOW (ref 34.8–46.6)
HGB: 6.9 g/dL — CL (ref 11.6–15.9)
LYMPH#: 1.4 10*3/uL (ref 0.9–3.3)
LYMPH%: 17.3 % (ref 14.0–48.0)
MCH: 18.8 pg — ABNORMAL LOW (ref 26.0–34.0)
MCHC: 28.2 g/dL — ABNORMAL LOW (ref 32.0–36.0)
MCV: 67 fL — ABNORMAL LOW (ref 81–101)
MONO#: 0.7 10*3/uL (ref 0.1–0.9)
MONO%: 8.6 % (ref 0.0–13.0)
NEUT#: 5.6 10*3/uL (ref 1.5–6.5)
NEUT%: 70.2 % (ref 39.6–80.0)
Platelets: 299 10*3/uL (ref 145–400)
RBC: 3.67 10*6/uL — ABNORMAL LOW (ref 3.70–5.32)
RDW: 18.1 % — ABNORMAL HIGH (ref 11.1–15.7)
WBC: 8 10*3/uL (ref 3.9–10.0)

## 2016-01-24 MED ORDER — SODIUM CHLORIDE 0.9 % IV SOLN
Freq: Once | INTRAVENOUS | Status: AC
  Administered 2016-01-24: 14:00:00 via INTRAVENOUS

## 2016-01-24 MED ORDER — SODIUM CHLORIDE 0.9 % IV SOLN
510.0000 mg | Freq: Once | INTRAVENOUS | Status: AC
  Administered 2016-01-24: 510 mg via INTRAVENOUS
  Filled 2016-01-24: qty 17

## 2016-01-24 NOTE — Telephone Encounter (Signed)
Critical Value HGB 6.9 Erin GinsSarah [place] NP notified. No orders at this time.

## 2016-01-24 NOTE — Progress Notes (Signed)
Hematology and Oncology Follow Up Visit  Erin Donaldson [id number] [birth year] 65 y.o. 01/24/2016   Principle Diagnosis:  Iron deficiency anemia secondary to malabsorption due to gastric bypass   Current Therapy:   IV iron as indicated     Interim History:  Ms. Erin Donaldson is here today for a follow-up. She has not been seen by Dr. Myna Hidalgo in several years. She recently had lab work with her PCP that revealed an iron saturation of 3% and ferritin of 4. Her Hgb at that time was 7.9. She has not received an iron infusion in quite some time.  She is a little fatigued at this time and states that she attributes this to having custody of her one year old great grandson and chasing him around.  No episodes of bleeding, bruising or petechiae. No lymphadenopathy found on exam.  She states that her FOBT have been negative. No fever, chills, n/v, cough, rash, dizziness, chest pain, abdominal pain or changes in bowel or bladder habits.  She has occasional palpitations and SOB with exertion which resolves with taking a moment to rest.  No swelling, tenderness, numbness ir tingling in her extremities. No new aches or pains.  She has a good appetite and is staying well hydrated. Her weight is stable.   Medications:    Medication List       This list is accurate as of: 01/24/16  1:43 PM.  Always use your most recent med list.               cyclobenzaprine 10 MG tablet  Commonly known as:  FLEXERIL  TAKE 1/2 TO 1 TAB TWICE A DAY AS DIRECTED     donepezil 10 MG tablet  Commonly known as:  ARICEPT  TAKE 1 TABLET (10 MG TOTAL) BY MOUTH DAILY.     famotidine 20 MG tablet  Commonly known as:  PEPCID  TAKE 1 TABLET (20 MG TOTAL) BY MOUTH 2 (TWO) TIMES DAILY.     fluticasone 50 MCG/ACT nasal spray  Commonly known as:  FLONASE  Place 2 sprays into both nostrils daily.     furosemide 20 MG tablet  Commonly known as:  LASIX  TAKE 1 TABLET (20 MG TOTAL) BY MOUTH DAILY.     gabapentin 300 MG  capsule  Commonly known as:  NEURONTIN  TAKE 3 CAPSULE BY MOUTH THREE TIMES A DAY     gabapentin 300 MG capsule  Commonly known as:  NEURONTIN  TAKE 3 CAPSULE BY MOUTH THREE TIMES A DAY     HYDROcodone-acetaminophen 5-325 MG tablet  Commonly known as:  NORCO  Take 1 tablet by mouth every 6 (six) hours as needed for moderate pain.     lamoTRIgine 200 MG tablet  Commonly known as:  LAMICTAL  Take 200 mg by mouth daily.     meclizine 12.5 MG tablet  Commonly known as:  ANTIVERT  Take 1 tablet (12.5 mg total) by mouth 3 (three) times daily as needed for dizziness.     methocarbamol 750 MG tablet  Commonly known as:  ROBAXIN  TAKE 1 TABLET (750 MG TOTAL) BY MOUTH 4 (FOUR) TIMES DAILY.     nystatin 100000 UNIT/ML suspension  Commonly known as:  MYCOSTATIN  Take 5 mLs (500,000 Units total) by mouth 4 (four) times daily.     omeprazole 40 MG capsule  Commonly known as:  PRILOSEC  TAKE 2 CAPSULES (80 MG TOTAL) BY MOUTH DAILY.     QUEtiapine 400 MG tablet  Commonly known as:  SEROQUEL  Take 800 mg by mouth at bedtime.     QUEtiapine 200 MG tablet  Commonly known as:  SEROQUEL  Take 200 mg by mouth daily.     rizatriptan 10 MG tablet  Commonly known as:  MAXALT  Take 1 tablet (10 mg total) by mouth as needed. May repeat in 2 hours if needed     Vitamin D (Ergocalciferol) 50000 units Caps capsule  Commonly known as:  DRISDOL  Take 1 capsule (50,000 Units total) by mouth every 7 (seven) days.        Allergies:  Allergies  Allergen Reactions  . Amoxicillin   . Darvocet [Propoxyphene N-Acetaminophen]   . Decongestant [Oxymetazoline]   . Elavil [Amitriptyline]   . Risperidone And Related   . Ultram [Tramadol]     Past Medical History, Surgical history, Social history, and Family History were reviewed and updated.  Review of Systems: All other 10 point review of systems is negative.   Physical Exam:  vitals were not taken for this visit.  Wt Readings from Last 3  Encounters:  11/20/15 163 lb (73.936 kg)  08/20/15 156 lb 12.8 oz (71.124 kg)  07/15/15 160 lb 12.8 oz (72.938 kg)    Ocular: Sclerae unicteric, pupils equal, round and reactive to light Ear-nose-throat: Oropharynx clear, dentition fair Lymphatic: No cervical supraclavicular or axillary adenopathy Lungs no rales or rhonchi, good excursion bilaterally Heart regular rate and rhythm, no murmur appreciated Abd soft, nontender, positive bowel sounds, no liver or spleen tip palpated on exam, no fluid wave  MSK no focal spinal tenderness, no joint edema Neuro: non-focal, well-oriented, appropriate affect Breasts: Deferred   Lab Results  Component Value Date   WBC 6.0 12/03/2015   HGB 8.2* 11/20/2015   HCT 26.8* 12/03/2015   MCV 67* 12/03/2015   PLT 326 12/03/2015   Lab Results  Component Value Date   FERRITIN 4* 12/03/2015   IRON 14* 12/03/2015   TIBC 409 12/03/2015   UIBC 395* 12/03/2015   IRONPCTSAT 3* 12/03/2015   Lab Results  Component Value Date   RETICCTPCT 1.0 01/29/2014   RBC 4.01 12/03/2015   RETICCTABS 40.4 05/28/2011   Lab Results  Component Value Date   KPAFRELGTCHN 0.91 10/03/2007   LAMBDASER 1.54 10/03/2007   KAPLAMBRATIO 0.59 10/03/2007   No results found for: Loel Lofty, IGMSERUM Lab Results  Component Value Date   TOTALPROTELP 5.7* 05/28/2011   ALBUMINELP 53.7* 05/28/2011   A1GS 7.3* 05/28/2011   A2GS 12.7* 05/28/2011   BETS 5.4 05/28/2011   BETA2SER 5.1 05/28/2011   GAMS 15.8 05/28/2011   MSPIKE NOT DET 05/28/2011   SPEI * 05/28/2011     Chemistry      Component Value Date/Time   NA 140 12/03/2015 1132   NA 138 04/17/2013 1135   NA 135 07/15/2011 1413   K 5.0 12/03/2015 1132   K 4.4 07/15/2011 1413   CL 102 12/03/2015 1132   CL 97* 07/15/2011 1413   CO2 23 12/03/2015 1132   CO2 32 07/15/2011 1413   BUN 14 12/03/2015 1132   BUN 9 04/17/2013 1135   BUN 16 07/15/2011 1413   CREATININE 1.00 12/03/2015 1132   CREATININE 0.93 04/17/2013  1135      Component Value Date/Time   CALCIUM 8.8 12/03/2015 1132   CALCIUM 9.0 07/15/2011 1413   ALKPHOS 239* 12/03/2015 1132   ALKPHOS 124* 07/15/2011 1413   AST 17 12/03/2015 1132   AST 48* 07/15/2011  1413   ALT 9 12/03/2015 1132   ALT 22 07/15/2011 1413   BILITOT <0.2 12/03/2015 1132   BILITOT <0.2 01/17/2014 1050   BILITOT 0.50 07/15/2011 1413     Impression and Plan: Ms. Lendell CapriceSullivan is a very pleasant 65 yo white female with history of iron deficiency anemia secondary to malabsorption due to gastric bypass. She is having occasional palpitations and SOB with exertion as well as fatigue. She has not received IV iron in several years.  Her Hgb at this time is 6.9. Her most recent iron saturation was 3% with a ferritin of 4.  We will give her a dose of Feraheme today and again in 8 days.  We will plan to see her back in 6 weeks for lab work and follow-up.  She will contact us with any questions or concerns. We can certainly see her sooner if need be.   Verdie MosherINCINNATI,SARAH M, NP 3/10/20171:43 PM

## 2016-01-24 NOTE — Patient Instructions (Signed)

## 2016-01-25 LAB — RETICULOCYTES: Reticulocyte Count: 1.5 % (ref 0.6–2.6)

## 2016-01-27 LAB — IRON AND TIBC
%SAT: 3 % — ABNORMAL LOW (ref 21–57)
Iron: 12 ug/dL — ABNORMAL LOW (ref 41–142)
TIBC: 389 ug/dL (ref 236–444)
UIBC: 377 ug/dL (ref 120–384)

## 2016-01-27 LAB — FERRITIN: Ferritin: <4 ng/ml — ABNORMAL LOW (ref 9–269)

## 2016-01-31 ENCOUNTER — Ambulatory Visit (HOSPITAL_BASED_OUTPATIENT_CLINIC_OR_DEPARTMENT_OTHER): Payer: Medicare Other

## 2016-01-31 VITALS — BP 138/64 | HR 88 | Temp 97.6°F | Resp 18

## 2016-01-31 DIAGNOSIS — D509 Iron deficiency anemia, unspecified: Secondary | ICD-10-CM

## 2016-01-31 MED ORDER — SODIUM CHLORIDE 0.9 % IV SOLN
Freq: Once | INTRAVENOUS | Status: AC
  Administered 2016-01-31: 14:00:00 via INTRAVENOUS

## 2016-01-31 MED ORDER — SODIUM CHLORIDE 0.9 % IV SOLN
510.0000 mg | Freq: Once | INTRAVENOUS | Status: AC
  Administered 2016-01-31: 510 mg via INTRAVENOUS
  Filled 2016-01-31: qty 17

## 2016-01-31 NOTE — Patient Instructions (Signed)

## 2016-02-05 ENCOUNTER — Other Ambulatory Visit: Payer: Self-pay | Admitting: Family Medicine

## 2016-02-06 ENCOUNTER — Other Ambulatory Visit: Payer: Self-pay | Admitting: Family Medicine

## 2016-03-01 ENCOUNTER — Other Ambulatory Visit: Payer: Self-pay | Admitting: Family Medicine

## 2016-03-13 ENCOUNTER — Ambulatory Visit: Payer: Medicare Other | Admitting: Hematology & Oncology

## 2016-03-13 ENCOUNTER — Other Ambulatory Visit: Payer: Medicare Other

## 2016-03-25 ENCOUNTER — Ambulatory Visit: Payer: Medicare Other | Admitting: Family Medicine

## 2016-04-24 ENCOUNTER — Ambulatory Visit (INDEPENDENT_AMBULATORY_CARE_PROVIDER_SITE_OTHER): Payer: Medicare Other | Admitting: Nurse Practitioner

## 2016-04-24 ENCOUNTER — Other Ambulatory Visit: Payer: Self-pay | Admitting: Nurse Practitioner

## 2016-04-24 ENCOUNTER — Encounter: Payer: Self-pay | Admitting: Nurse Practitioner

## 2016-04-24 VITALS — BP 126/83 | HR 75 | Temp 97.6°F | Ht 66.0 in | Wt 169.0 lb

## 2016-04-24 DIAGNOSIS — B372 Candidiasis of skin and nail: Secondary | ICD-10-CM

## 2016-04-24 DIAGNOSIS — L293 Anogenital pruritus, unspecified: Secondary | ICD-10-CM

## 2016-04-24 DIAGNOSIS — L290 Pruritus ani: Secondary | ICD-10-CM

## 2016-04-24 LAB — WET PREP FOR TRICH, YEAST, CLUE
Clue Cell Exam: NEGATIVE
Trichomonas Exam: NEGATIVE
Yeast Exam: NEGATIVE

## 2016-04-24 MED ORDER — FLUCONAZOLE 150 MG PO TABS: ORAL_TABLET | ORAL | Status: DC

## 2016-04-24 MED ORDER — DOXYCYCLINE HYCLATE 100 MG PO TABS: 100.0000 mg | ORAL_TABLET | Freq: Two times a day (BID) | ORAL | Status: DC

## 2016-04-24 MED ORDER — NYSTATIN 100000 UNIT/GM EX CREA
1.0000 | TOPICAL_CREAM | Freq: Two times a day (BID) | CUTANEOUS | Status: DC
Start: 2016-04-24 — End: 2016-05-14

## 2016-04-24 NOTE — Progress Notes (Signed)
   Subjective:    Patient ID: Erin Donaldson, female    DOB: [birth year], 65 y.o.   MRN: [id number]  HPI Patient says that she has had an allergic reaction to a panty liner she was using. Perineal area feels raw with rash in groin area.  Slight itching and burning.She has tried vaseline, monistat and destin and nothing has helped. No vaginal discharge.    Review of Systems  Constitutional: Negative.   HENT: Negative.   Respiratory: Negative.   Cardiovascular: Negative.   Gastrointestinal: Negative.   Genitourinary: Negative.   Psychiatric/Behavioral: Negative.   All other systems reviewed and are negative.      Objective:   Physical Exam  Constitutional: She appears well-developed and well-nourished.  Cardiovascular: Normal rate, regular rhythm and normal heart sounds.   Pulmonary/Chest: Effort normal and breath sounds normal.  Genitourinary:  Perineal erythematous and moist appearing. Slight vaginal discharge noted. No adnexal mass or tenderness  Skin: Skin is warm.  Psychiatric: She has a normal mood and affect. Her behavior is normal. Judgment and thought content normal.    BP 126/83 mmHg  Pulse 75  Temp(Src) 97.6 F (36.4 C) (Oral)  Ht 5\' 6"  (1.676 m)  Wt 169 lb (76.658 kg)  BMI 27.29 kg/m2  Wet perp- no yeast, no clue cells- many bacteria     Assessment & Plan:   1. Perineal itching, female   2. Cutaneous candidiasis    Meds ordered this encounter  Medications  . fluconazole (DIFLUCAN) 150 MG tablet    Sig: 1 po q week x 4 weeks    Dispense:  4 tablet    Refill:  0    Order Specific Question:  Supervising Provider    Answer:  Ernestina PennaMOORE, DONALD W [[id number]]  . nystatin cream (MYCOSTATIN)    Sig: Apply 1 application topically 2 (two) times daily.    Dispense:  30 g    Refill:  0    Order Specific Question:  Supervising Provider    Answer:  Ernestina PennaMOORE, DONALD W [[id number]]   Keep area as clean and dry as possible Avoid rubbing or scratching RTO if no better in 2-3  days  Mary-Margaret Daphine DeutscherMartin, FNP

## 2016-04-24 NOTE — Patient Instructions (Signed)
Cutaneous Candidiasis °Cutaneous candidiasis is a condition in which there is an overgrowth of yeast (candida) on the skin. Yeast normally live on the skin, but in small enough numbers not to cause any symptoms. In certain cases, increased growth of the yeast may cause an actual yeast infection. This kind of infection usually occurs in areas of the skin that are constantly warm and moist, such as the armpits or the groin. Yeast is the most common cause of diaper rash in babies and in people who cannot control their bowel movements (incontinence). °CAUSES  °The fungus that most often causes cutaneous candidiasis is Candida albicans. Conditions that can increase the risk of getting a yeast infection of the skin include: °· Obesity. °· Pregnancy. °· Diabetes. °· Taking antibiotic medicine. °· Taking birth control pills. °· Taking steroid medicines. °· Thyroid disease. °· An iron or zinc deficiency. °· Problems with the immune system. °SYMPTOMS  °· Red, swollen area of the skin. °· Bumps on the skin. °· Itchiness. °DIAGNOSIS  °The diagnosis of cutaneous candidiasis is usually based on its appearance. Light scrapings of the skin may also be taken and viewed under a microscope to identify the presence of yeast. °TREATMENT  °Antifungal creams may be applied to the infected skin. In severe cases, oral medicines may be needed.  °HOME CARE INSTRUCTIONS  °· Keep your skin clean and dry. °· Maintain a healthy weight. °· If you have diabetes, keep your blood sugar under control. °SEEK IMMEDIATE MEDICAL CARE IF: °· Your rash continues to spread despite treatment. °· You have a fever, chills, or abdominal pain. °  °This information is not intended to replace advice given to you by your health care provider. Make sure you discuss any questions you have with your health care provider. °  °Document Released: 07/21/2011 Document Revised: 01/25/2012 Document Reviewed: 05/06/2015 °Elsevier Interactive Patient Education ©2016 Elsevier  Inc. ° °

## 2016-05-09 ENCOUNTER — Other Ambulatory Visit: Payer: Self-pay | Admitting: Family Medicine

## 2016-05-14 ENCOUNTER — Other Ambulatory Visit: Payer: Self-pay | Admitting: Nurse Practitioner

## 2016-05-14 ENCOUNTER — Other Ambulatory Visit: Payer: Self-pay | Admitting: Family Medicine

## 2016-06-05 ENCOUNTER — Other Ambulatory Visit: Payer: Self-pay | Admitting: *Deleted

## 2016-06-05 MED ORDER — OMEPRAZOLE 40 MG PO CPDR: DELAYED_RELEASE_CAPSULE | ORAL | Status: DC

## 2016-06-05 MED ORDER — FUROSEMIDE 20 MG PO TABS: 20.0000 mg | ORAL_TABLET | Freq: Every day | ORAL | Status: DC

## 2016-06-05 NOTE — Addendum Note (Signed)
Addended by: Gwenith DailyHUDY, Alfredia Desanctis N on: 06/05/2016 01:53 PM   Modules accepted: Orders

## 2016-06-15 ENCOUNTER — Telehealth: Payer: Self-pay | Admitting: Family Medicine

## 2016-06-16 ENCOUNTER — Ambulatory Visit: Payer: Medicare Other | Admitting: Family Medicine

## 2016-06-17 ENCOUNTER — Telehealth: Payer: Self-pay | Admitting: Family Medicine

## 2016-07-01 ENCOUNTER — Other Ambulatory Visit: Payer: Self-pay | Admitting: Family Medicine

## 2016-07-29 ENCOUNTER — Ambulatory Visit: Payer: Medicare Other | Admitting: Family Medicine

## 2016-08-03 ENCOUNTER — Other Ambulatory Visit: Payer: Self-pay | Admitting: Family Medicine

## 2016-08-17 ENCOUNTER — Other Ambulatory Visit: Payer: Self-pay | Admitting: Family Medicine

## 2016-09-14 ENCOUNTER — Other Ambulatory Visit: Payer: Self-pay | Admitting: Family Medicine

## 2016-09-15 ENCOUNTER — Telehealth: Payer: Self-pay | Admitting: Family Medicine

## 2016-09-15 NOTE — Telephone Encounter (Signed)
Pt has relative with pink eye dxed on Sunday Instructed pt to use good hand washing and should be ok as long as no redness, matting or drainage

## 2016-09-17 ENCOUNTER — Encounter: Payer: Self-pay | Admitting: Family

## 2016-09-17 ENCOUNTER — Ambulatory Visit (INDEPENDENT_AMBULATORY_CARE_PROVIDER_SITE_OTHER): Payer: Medicare Other | Admitting: Family

## 2016-09-17 VITALS — BP 125/86 | HR 87 | Temp 97.2°F | Ht 66.0 in | Wt 173.0 lb

## 2016-09-17 DIAGNOSIS — M549 Dorsalgia, unspecified: Secondary | ICD-10-CM

## 2016-09-17 DIAGNOSIS — J0100 Acute maxillary sinusitis, unspecified: Secondary | ICD-10-CM

## 2016-09-17 MED ORDER — AMOXICILLIN-POT CLAVULANATE 875-125 MG PO TABS: 1.0000 | ORAL_TABLET | Freq: Two times a day (BID) | ORAL | 0 refills | Status: DC

## 2016-09-17 MED ORDER — CYCLOBENZAPRINE HCL 10 MG PO TABS: ORAL_TABLET | ORAL | 1 refills | Status: DC

## 2016-09-17 MED ORDER — FLUCONAZOLE 150 MG PO TABS: 150.0000 mg | ORAL_TABLET | Freq: Once | ORAL | 0 refills | Status: DC

## 2016-09-17 NOTE — Patient Instructions (Signed)

## 2016-09-17 NOTE — Progress Notes (Signed)
Subjective:    Patient ID: Erin Donaldson, female    DOB: 06-27-1951, 65 y.o.   MRN: [id number]  Sinus Problem  This is a new problem. The current episode started 1 to 4 weeks ago. The problem has been gradually worsening since onset. There has been no fever. Her pain is at a severity of 9/10. The pain is moderate. Associated symptoms include ear pain, headaches, a hoarse voice, sinus pressure, sneezing and a sore throat. Pertinent negatives include no congestion, coughing or swollen glands. Past treatments include acetaminophen (allergy medication ). The treatment provided mild relief.  Back Pain  This is a new problem. The current episode started more than 1 month ago. The problem occurs intermittently. The problem has been waxing and waning since onset. The pain is present in the lumbar spine and thoracic spine. The quality of the pain is described as cramping. The pain is at a severity of 7/10. The pain is moderate. Associated symptoms include headaches. Pertinent negatives include no paresis or weakness. Risk factors include sedentary lifestyle and obesity. She has tried ice and NSAIDs for the symptoms. The treatment provided mild relief.      Review of Systems  HENT: Positive for ear pain, hoarse voice, sinus pressure, sneezing and sore throat. Negative for congestion.   Respiratory: Negative for cough.   Musculoskeletal: Positive for back pain.  Neurological: Positive for headaches. Negative for weakness.  All other systems reviewed and are negative.      Objective:   Physical Exam  Constitutional: She is oriented to person, place, and time. She appears well-developed and well-nourished. No distress.  HENT:  Head: Normocephalic and atraumatic.  Right Ear: External ear normal.  Left Ear: External ear normal.  Nose: Mucosal edema and rhinorrhea present. Right sinus exhibits maxillary sinus tenderness and frontal sinus tenderness. Left sinus exhibits maxillary sinus tenderness and  frontal sinus tenderness.  Mouth/Throat: Posterior oropharyngeal edema and posterior oropharyngeal erythema present.  Nasal passage erythemas with mild swelling   Eyes: Pupils are equal, round, and reactive to light.  Neck: Normal range of motion. Neck supple. No thyromegaly present.  Cardiovascular: Normal rate, regular rhythm, normal heart sounds and intact distal pulses.   No murmur heard. Pulmonary/Chest: Effort normal and breath sounds normal. No respiratory distress. She has no wheezes.  Abdominal: Soft. Bowel sounds are normal. She exhibits no distension. There is no tenderness.  Musculoskeletal: Normal range of motion. She exhibits no edema or tenderness.  Neurological: She is alert and oriented to person, place, and time. She has normal reflexes. No cranial nerve deficit.  Skin: Skin is warm and dry.  Psychiatric: She has a normal mood and affect. Her behavior is normal. Judgment and thought content normal.  Vitals reviewed.     BP 125/86   Pulse 87   Temp 97.2 F (36.2 C) (Oral)   Ht 5\' 6"  (1.676 m)   Wt 173 lb (78.5 kg)   BMI 27.92 kg/m      Assessment & Plan:  1. Acute maxillary sinusitis, recurrence not specified -- Take meds as prescribed - Use a cool mist humidifier  -Use saline nose sprays frequently -Saline irrigations of the nose can be very helpful if done frequently.  * 4X daily for 1 week*  * Use of a nettie pot can be helpful with this. Follow directions with this* -Force fluids -For any cough or congestion  Use plain Mucinex- regular strength or max strength is fine   * Children- consult with  Pharmacist for dosing -For fever or aces or pains- take tylenol or ibuprofen appropriate for age and weight.  * for fevers greater than 101 orally you may alternate ibuprofen and tylenol every  3 hours. -Throat lozenges if help - amoxicillin-clavulanate (AUGMENTIN) 875-125 MG tablet; Take 1 tablet by mouth 2 (two) times daily.  Dispense: 14 tablet; Refill:  0  2. Acute bilateral back pain, unspecified back location -Rest -Ice and heat as needed -Take flexeril only as needed, sedation precaution discussed - cyclobenzaprine (FLEXERIL) 10 MG tablet; TAKE 1/2 TO 1 TAB TWICE A DAY AS DIRECTED  Dispense: 30 tablet; Refill: 1  Jannifer Rodneyhristy Damari Suastegui, FNP

## 2016-10-02 ENCOUNTER — Ambulatory Visit (INDEPENDENT_AMBULATORY_CARE_PROVIDER_SITE_OTHER): Payer: Medicare Other

## 2016-10-02 DIAGNOSIS — Z23 Encounter for immunization: Secondary | ICD-10-CM | POA: Diagnosis not present

## 2016-11-05 ENCOUNTER — Other Ambulatory Visit: Payer: Self-pay | Admitting: Nurse Practitioner

## 2016-11-08 ENCOUNTER — Other Ambulatory Visit: Payer: Self-pay | Admitting: Family Medicine

## 2016-11-17 ENCOUNTER — Other Ambulatory Visit: Payer: Self-pay | Admitting: Family

## 2016-12-20 ENCOUNTER — Other Ambulatory Visit: Payer: Self-pay | Admitting: Family Medicine

## 2017-01-01 ENCOUNTER — Other Ambulatory Visit: Payer: Self-pay | Admitting: Family

## 2017-01-23 ENCOUNTER — Other Ambulatory Visit: Payer: Self-pay | Admitting: Family Medicine

## 2017-01-25 NOTE — Telephone Encounter (Signed)
Patient's last office visit 11/20/2015

## 2017-02-08 ENCOUNTER — Other Ambulatory Visit: Payer: Self-pay | Admitting: Family

## 2017-02-11 ENCOUNTER — Other Ambulatory Visit: Payer: Self-pay | Admitting: Family Medicine

## 2017-02-16 ENCOUNTER — Other Ambulatory Visit: Payer: Self-pay | Admitting: Family

## 2017-02-19 ENCOUNTER — Other Ambulatory Visit: Payer: Self-pay | Admitting: Family

## 2017-02-22 ENCOUNTER — Ambulatory Visit: Payer: Medicare Other | Admitting: Family Medicine

## 2017-03-04 ENCOUNTER — Other Ambulatory Visit: Payer: Self-pay | Admitting: Family Medicine

## 2017-03-10 ENCOUNTER — Other Ambulatory Visit: Payer: Self-pay | Admitting: Family Medicine

## 2017-04-01 ENCOUNTER — Ambulatory Visit: Payer: Medicare Other | Admitting: Family Medicine

## 2017-04-06 ENCOUNTER — Other Ambulatory Visit: Payer: Self-pay | Admitting: Family

## 2017-04-19 ENCOUNTER — Ambulatory Visit (INDEPENDENT_AMBULATORY_CARE_PROVIDER_SITE_OTHER): Payer: Medicare Other | Admitting: Family Medicine

## 2017-04-19 ENCOUNTER — Ambulatory Visit (INDEPENDENT_AMBULATORY_CARE_PROVIDER_SITE_OTHER): Payer: Medicare Other

## 2017-04-19 ENCOUNTER — Encounter: Payer: Self-pay | Admitting: Family Medicine

## 2017-04-19 ENCOUNTER — Other Ambulatory Visit: Payer: Self-pay | Admitting: Family Medicine

## 2017-04-19 ENCOUNTER — Ambulatory Visit: Payer: Medicare Other

## 2017-04-19 VITALS — BP 109/74 | HR 91 | Temp 98.8°F | Ht 66.0 in | Wt 170.0 lb

## 2017-04-19 DIAGNOSIS — I10 Essential (primary) hypertension: Secondary | ICD-10-CM

## 2017-04-19 DIAGNOSIS — F3170 Bipolar disorder, currently in remission, most recent episode unspecified: Secondary | ICD-10-CM

## 2017-04-19 DIAGNOSIS — Z0000 Encounter for general adult medical examination without abnormal findings: Secondary | ICD-10-CM | POA: Diagnosis not present

## 2017-04-19 DIAGNOSIS — K589 Irritable bowel syndrome without diarrhea: Secondary | ICD-10-CM

## 2017-04-19 DIAGNOSIS — E559 Vitamin D deficiency, unspecified: Secondary | ICD-10-CM

## 2017-04-19 DIAGNOSIS — Z9884 Bariatric surgery status: Secondary | ICD-10-CM

## 2017-04-19 DIAGNOSIS — K219 Gastro-esophageal reflux disease without esophagitis: Secondary | ICD-10-CM

## 2017-04-19 DIAGNOSIS — R8290 Unspecified abnormal findings in urine: Secondary | ICD-10-CM

## 2017-04-19 DIAGNOSIS — Z1211 Encounter for screening for malignant neoplasm of colon: Secondary | ICD-10-CM

## 2017-04-19 DIAGNOSIS — Z1231 Encounter for screening mammogram for malignant neoplasm of breast: Secondary | ICD-10-CM

## 2017-04-19 DIAGNOSIS — E785 Hyperlipidemia, unspecified: Secondary | ICD-10-CM | POA: Diagnosis not present

## 2017-04-19 DIAGNOSIS — D509 Iron deficiency anemia, unspecified: Secondary | ICD-10-CM

## 2017-04-19 DIAGNOSIS — R945 Abnormal results of liver function studies: Secondary | ICD-10-CM

## 2017-04-19 DIAGNOSIS — E1169 Type 2 diabetes mellitus with other specified complication: Secondary | ICD-10-CM | POA: Diagnosis not present

## 2017-04-19 DIAGNOSIS — R7989 Other specified abnormal findings of blood chemistry: Secondary | ICD-10-CM

## 2017-04-19 LAB — MICROSCOPIC EXAMINATION: Renal Epithel, UA: NONE SEEN /hpf

## 2017-04-19 LAB — URINALYSIS, COMPLETE
Bilirubin, UA: NEGATIVE
Glucose, UA: NEGATIVE
Ketones, UA: NEGATIVE
Nitrite, UA: NEGATIVE
Protein, UA: NEGATIVE
Specific Gravity, UA: <1.005 — ABNORMAL LOW (ref 1.005–1.030)
Urobilinogen, Ur: 0.2 mg/dL (ref 0.2–1.0)
pH, UA: 5.5 (ref 5.0–7.5)

## 2017-04-19 MED ORDER — FUROSEMIDE 20 MG PO TABS: 20.0000 mg | ORAL_TABLET | Freq: Every day | ORAL | 1 refills | Status: DC

## 2017-04-19 NOTE — Progress Notes (Signed)
Subjective:    Patient ID: Erin Donaldson, female    DOB: 04-15-1951, 66 y.o.   MRN: [id number]  HPI  Ms. Kretsch is here today for a annual exam on hypertension, hyperlipidemia, diabetes, and anemia. She complains of foul urine odor and cloudy urine.  The patient is here today for a complete physical exam. She complains of a foul smelling urine and a cloudy urine. She is requesting refills on her Lasix. She is due for a Pap smear and mammogram and this will be scheduled for her before she leaves office today she is also due to get a DEXA scan. She will get a chest x-ray today and an EKG today. She will return an FOBT and an order was placed to get her lab work. The patient has a remote history of a gastric bypass. Her weight today is 170 down 3 pounds from what it was at her last visit. She does have diabetes. All her medicines were reviewed and she also is taking medicine for her memory. She does see a psychiatrist and is on labetalol and Seroquel from him for her bipolar disorder and general anxiety. The patient looks good and says her psychological health is as good as it has been any time. She does not see her psychiatrist but once yearly and this is in December. He refills her medicines halfway in between. She is on the verge of adopting her great-grandchild and discuss this with me during the visit. She denies any chest pain shortness of breath. She no longer has any syncopal spells like she used to have and she does not remember when she had the last one. She does have indigestion sometimes and she has ongoing constipation for which she takes a laxative daily. She is due to get her colonoscopy very soon and we will make sure that this happens for her. She says as far as voiding is concerned is no problems other than the urine has an odor and is very cloudy. She sees Dr. Clovis Pu for her bipolar disorder.      Patient Active Problem List   Diagnosis Date Noted  . Iron deficiency anemia  01/24/2016  . Swelling of right lower extremity 07/15/2015  . Right hip pain 07/15/2015  . Microcytic anemia 07/15/2015  . Elevated LFTs   . History of gastric bypass 01/15/2014  . Amnestic disorder due to medical condition 08/23/2013  . Diarrhea 07/19/2013  . BIPOLAR AFFECTIVE DISORDER 08/26/2009  . ANXIETY 08/26/2009  . MIGRAINE HEADACHE 08/26/2009  . Type 2 diabetes mellitus with hyperlipidemia ([place]) 08/15/2009  . Hyperlipidemia 08/15/2009  . Essential hypertension 08/15/2009  . GERD 08/15/2009  . CONSTIPATION 08/15/2009  . IRRITABLE BOWEL SYNDROME 08/15/2009   Outpatient Encounter Prescriptions as of 04/19/2017  Medication Sig  . cyclobenzaprine (FLEXERIL) 10 MG tablet TAKE 1/2 TO 1 TAB TWICE A DAY AS DIRECTED  . donepezil (ARICEPT) 10 MG tablet TAKE 1 TABLET (10 MG TOTAL) BY MOUTH DAILY.  . fluticasone (FLONASE) 50 MCG/ACT nasal spray PLACE 2 SPRAYS INTO BOTH NOSTRILS DAILY.  . furosemide (LASIX) 20 MG tablet TAKE 1 TABLET (20 MG TOTAL) BY MOUTH DAILY.  Marland Kitchen gabapentin (NEURONTIN) 300 MG capsule TAKE 3 CAPSULE BY MOUTH THREE TIMES A DAY  . lamoTRIgine (LAMICTAL) 200 MG tablet Take 200 mg by mouth daily.  . meclizine (ANTIVERT) 12.5 MG tablet Take 1 tablet (12.5 mg total) by mouth 3 (three) times daily as needed for dizziness.  . methocarbamol (ROBAXIN) 750 MG tablet TAKE 1  TABLET (750 MG TOTAL) BY MOUTH 4 (FOUR) TIMES DAILY.  Marland Kitchen nystatin cream (MYCOSTATIN) APPLY 1 APPLICATION TOPICALLY 2 (TWO) TIMES DAILY.  Marland Kitchen omeprazole (PRILOSEC) 40 MG capsule TAKE 2 CAPSULES (80 MG TOTAL) BY MOUTH DAILY.  Marland Kitchen QUEtiapine (SEROQUEL) 200 MG tablet Take 200 mg by mouth daily.  . QUEtiapine (SEROQUEL) 400 MG tablet Take 800 mg by mouth at bedtime.   . rizatriptan (MAXALT) 10 MG tablet Take 1 tablet (10 mg total) by mouth as needed. May repeat in 2 hours if needed  . Vitamin D, Ergocalciferol, (DRISDOL) 50000 UNITS CAPS capsule Take 1 capsule (50,000 Units total) by mouth every 7 (seven) days.  .  [DISCONTINUED] donepezil (ARICEPT) 10 MG tablet TAKE 1 TABLET (10 MG TOTAL) BY MOUTH DAILY.  . [DISCONTINUED] donepezil (ARICEPT) 10 MG tablet TAKE 1 TABLET (10 MG TOTAL) BY MOUTH DAILY.  . [DISCONTINUED] amoxicillin-clavulanate (AUGMENTIN) 875-125 MG tablet Take 1 tablet by mouth 2 (two) times daily.   No facility-administered encounter medications on file as of 04/19/2017.       Review of Systems  Constitutional: Negative.   HENT: Negative.   Eyes: Negative.   Respiratory: Negative.   Cardiovascular: Negative.   Gastrointestinal: Negative.   Endocrine: Negative.   Genitourinary: Dyspareunia: urine odor.       Cloudy urine   Musculoskeletal: Negative.   Skin: Negative.   Allergic/Immunologic: Negative.   Neurological: Negative.   Hematological: Negative.   Psychiatric/Behavioral: Negative.        Objective:   Physical Exam  Constitutional: She is oriented to person, place, and time. She appears well-developed and well-nourished.  The patient is pleasant and alert and doing well psychologically  HENT:  Head: Normocephalic and atraumatic.  Right Ear: External ear normal.  Left Ear: External ear normal.  Nose: Nose normal.  Mouth/Throat: Oropharynx is clear and moist. No oropharyngeal exudate.  Eyes: Conjunctivae and EOM are normal. Pupils are equal, round, and reactive to light. Right eye exhibits no discharge. Left eye exhibits no discharge. No scleral icterus.  The patient will schedule her eye exam  Neck: Normal range of motion. Neck supple. No thyromegaly present.  No bruits thyromegaly or anterior cervical adenopathy  Cardiovascular: Normal rate, regular rhythm, normal heart sounds and intact distal pulses.   No murmur heard. The heart is regular at 84/m  Pulmonary/Chest: Effort normal and breath sounds normal. No respiratory distress. She has no wheezes. She has no rales.  Clear anteriorly and posteriorly  Abdominal: Soft. Bowel sounds are normal. She exhibits no  mass. There is no tenderness. There is no rebound and no guarding.  Abdominal obesity without masses or tenderness.  Musculoskeletal: Normal range of motion. She exhibits no edema or tenderness.  Lymphadenopathy:    She has no cervical adenopathy.  Neurological: She is alert and oriented to person, place, and time. She has normal reflexes. No cranial nerve deficit.  Skin: Skin is warm and dry. No rash noted.  Psychiatric: She has a normal mood and affect. Her behavior is normal. Judgment and thought content normal.  Nursing note and vitals reviewed.         Assessment & Plan:  1. Iron deficiency anemia, unspecified iron deficiency anemia type - BMP8+EGFR - CBC with Differential/Platelet - DG [facility] DEXA  2. Type 2 diabetes mellitus with hyperlipidemia ([place]) -The patient says that her home blood sugars are running in the 85-95 range at any time during the day. We will call with her A1c results and make any  adjustments to medicine if needed - BMP8+EGFR - CBC with Differential/Platelet - Lipid panel - Bayer DCA Hb A1c Waived - Microalbumin / creatinine urine ratio  3. Hyperlipidemia, unspecified hyperlipidemia type -She will continue with aggressive therapeutic lifestyle changes pending results of lab work - BMP8+EGFR - CBC with Differential/Platelet - Hepatic function panel - Lipid panel - Bayer DCA Hb A1c Waived - EKG 12-Lead  4. Essential hypertension -The blood pressure is good today and she will continue with watching her sodium intake and her current fluid pill. - BMP8+EGFR - CBC with Differential/Platelet - Hepatic function panel - Lipid panel - Bayer DCA Hb A1c Waived - EKG 12-Lead  5. Gastroesophageal reflux disease, esophagitis presence not specified -She will continue with her omeprazole once or twice daily if she is currently doing watching her diet - BMP8+EGFR - CBC with Differential/Platelet - Fecal occult blood, imunochemical; Future  6. Irritable bowel  syndrome, unspecified type -She continues to have constipation issues. She is taking a laxative daily. She is being scheduled for a colonoscopy as this is due to an they can address the ongoing constipation issues. - BMP8+EGFR - CBC with Differential/Platelet - Fecal occult blood, imunochemical; Future  7. History of gastric bypass -This appears to be stable with no particular problems. - BMP8+EGFR - CBC with Differential/Platelet - EKG 12-Lead  8. Elevated LFTs - BMP8+EGFR - CBC with Differential/Platelet - Hepatic function panel  9. Microcytic anemia - BMP8+EGFR - CBC with Differential/Platelet  10. Annual physical exam -The patient will get an eye exam and she will arrange this. -We will schedule her for a colonoscopy. - BMP8+EGFR - CBC with Differential/Platelet - Fecal occult blood, imunochemical; Future - Hepatic function panel - Lipid panel - VITAMIN D 25 Hydroxy (Vit-D Deficiency, Fractures) - Thyroid Panel With TSH - DG [facility] DEXA - EKG 12-Lead - DG Chest 2 View; Future  11. Screening mammogram, encounter for - MM Digital Screening; Future  12. Colon cancer screening - Ambulatory referral to Gastroenterology  13. Abnormal urine odor - Urinalysis, Complete - Urine culture  14. Cloudy urine - Urinalysis, Complete - Urine culture  15. Vitamin D deficiency -Continue current treatment pending results of lab work  47. Bipolar affective disorder in remission [facility]) -Continue current medicines as prescribed by Dr. Clovis Pu and follow-up with him as planned  Meds ordered this encounter  Medications  . furosemide (LASIX) 20 MG tablet    Sig: Take 1 tablet (20 mg total) by mouth daily.    Dispense:  30 tablet    Refill:  1   Patient Instructions  Continue to follow-up with psychiatry Return to clinic and get DEXA scan as planned Return the FOBT Return to the office for fasting lab work Return to the office for Pap and pelvic exam Return to the office for  mammogram Continue to monitor blood sugars regularly and this summer drink plenty of fluids and stay well hydrated The patient will schedule her eye exam We will schedule her for her colonoscopy Use the dukes mixture, swish and expectorate 1 teaspoon after meals and at bedtime  Arrie Senate MD

## 2017-04-19 NOTE — Patient Instructions (Addendum)
Continue to follow-up with psychiatry Return to clinic and get DEXA scan as planned Return the FOBT Return to the office for fasting lab work Return to the office for Pap and pelvic exam Return to the office for mammogram Continue to monitor blood sugars regularly and this summer drink plenty of fluids and stay well hydrated The patient will schedule her eye exam We will schedule her for her colonoscopy Use the dukes mixture, swish and expectorate 1 teaspoon after meals and at bedtime

## 2017-04-20 ENCOUNTER — Other Ambulatory Visit: Payer: Self-pay | Admitting: *Deleted

## 2017-04-20 ENCOUNTER — Encounter: Payer: Self-pay | Admitting: Gastroenterology

## 2017-04-20 ENCOUNTER — Telehealth: Payer: Self-pay | Admitting: Family Medicine

## 2017-04-20 DIAGNOSIS — R9431 Abnormal electrocardiogram [ECG] [EKG]: Secondary | ICD-10-CM

## 2017-04-20 LAB — MICROALBUMIN / CREATININE URINE RATIO
Creatinine, Urine: 28.1 mg/dL
Microalb/Creat Ratio: <10.7 mg/g creat (ref 0.0–30.0)
Microalbumin, Urine: <3 ug/mL

## 2017-04-20 MED ORDER — GABAPENTIN 300 MG PO CAPS: ORAL_CAPSULE | ORAL | 3 refills | Status: DC

## 2017-04-20 MED ORDER — NYSTATIN 100000 UNIT/ML MT SUSP: 5.0000 mL | Freq: Four times a day (QID) | OROMUCOSAL | 0 refills | Status: DC

## 2017-04-20 NOTE — Telephone Encounter (Signed)
pt aware 

## 2017-04-22 ENCOUNTER — Telehealth: Payer: Self-pay | Admitting: Family Medicine

## 2017-04-22 DIAGNOSIS — B961 Klebsiella pneumoniae [K. pneumoniae] as the cause of diseases classified elsewhere: Principal | ICD-10-CM

## 2017-04-22 DIAGNOSIS — A498 Other bacterial infections of unspecified site: Secondary | ICD-10-CM

## 2017-04-22 LAB — URINE CULTURE

## 2017-04-22 MED ORDER — SULFAMETHOXAZOLE-TRIMETHOPRIM 800-160 MG PO TABS
1.0000 | ORAL_TABLET | Freq: Two times a day (BID) | ORAL | 0 refills | Status: DC
Start: 2017-04-22 — End: 2017-08-16

## 2017-04-22 NOTE — Telephone Encounter (Signed)
Patient aware of results.

## 2017-04-22 NOTE — Telephone Encounter (Signed)
Please call patient with result of urine- in computer

## 2017-04-29 ENCOUNTER — Telehealth: Payer: Self-pay | Admitting: Family Medicine

## 2017-04-29 NOTE — Telephone Encounter (Signed)
appt scheduled Pt notified 

## 2017-04-29 NOTE — Telephone Encounter (Signed)
What symptoms do you have? Trembling dizzy off balance   How long have you been sick?4 days  Have you been seen for this problem? no  If your provider decides to give you a prescription, which pharmacy would you like for it to be sent to? [facility]   Patient informed that this information will be sent to the clinical staff for review and that they should receive a follow up call.

## 2017-04-30 ENCOUNTER — Ambulatory Visit: Payer: Medicare Other | Admitting: Family

## 2017-05-04 ENCOUNTER — Telehealth: Payer: Self-pay | Admitting: Family Medicine

## 2017-05-04 ENCOUNTER — Ambulatory Visit: Payer: Medicare Other | Admitting: Physician Assistant

## 2017-05-04 NOTE — Telephone Encounter (Signed)
Pt has had elevated readings in the past Currently takes Lasix Pt states this is for swelling only Pt will discuss at next appt

## 2017-05-18 ENCOUNTER — Telehealth: Payer: Self-pay | Admitting: Family Medicine

## 2017-05-18 ENCOUNTER — Other Ambulatory Visit: Payer: Self-pay | Admitting: Family

## 2017-05-24 ENCOUNTER — Other Ambulatory Visit: Payer: Medicare Other | Admitting: Family

## 2017-06-01 ENCOUNTER — Other Ambulatory Visit: Payer: Medicare Other | Admitting: Family

## 2017-06-07 ENCOUNTER — Other Ambulatory Visit: Payer: Self-pay | Admitting: Family

## 2017-06-14 ENCOUNTER — Other Ambulatory Visit: Payer: Self-pay | Admitting: Family Medicine

## 2017-06-21 ENCOUNTER — Encounter: Payer: Medicare Other | Admitting: Gastroenterology

## 2017-06-23 ENCOUNTER — Ambulatory Visit: Payer: Medicare Other | Admitting: Cardiology

## 2017-07-22 ENCOUNTER — Other Ambulatory Visit: Payer: Self-pay | Admitting: Family Medicine

## 2017-07-23 ENCOUNTER — Other Ambulatory Visit: Payer: Self-pay | Admitting: Family

## 2017-07-23 DIAGNOSIS — M549 Dorsalgia, unspecified: Secondary | ICD-10-CM

## 2017-08-04 ENCOUNTER — Ambulatory Visit: Payer: Medicare Other | Admitting: Cardiology

## 2017-08-09 ENCOUNTER — Encounter: Payer: Self-pay | Admitting: Family Medicine

## 2017-08-16 ENCOUNTER — Ambulatory Visit: Payer: Medicare Other | Admitting: Family Medicine

## 2017-08-16 ENCOUNTER — Encounter: Payer: Self-pay | Admitting: Family Medicine

## 2017-08-16 ENCOUNTER — Ambulatory Visit (INDEPENDENT_AMBULATORY_CARE_PROVIDER_SITE_OTHER): Payer: Medicare Other | Admitting: Family Medicine

## 2017-08-16 VITALS — BP 120/80 | HR 83 | Temp 98.8°F | Ht 66.0 in | Wt 167.8 lb

## 2017-08-16 DIAGNOSIS — G43909 Migraine, unspecified, not intractable, without status migrainosus: Secondary | ICD-10-CM | POA: Diagnosis not present

## 2017-08-16 DIAGNOSIS — J0141 Acute recurrent pansinusitis: Secondary | ICD-10-CM

## 2017-08-16 MED ORDER — FLUCONAZOLE 150 MG PO TABS: ORAL_TABLET | ORAL | 0 refills | Status: DC

## 2017-08-16 MED ORDER — AMOXICILLIN-POT CLAVULANATE 875-125 MG PO TABS: 1.0000 | ORAL_TABLET | Freq: Two times a day (BID) | ORAL | 0 refills | Status: DC

## 2017-08-16 MED ORDER — RIZATRIPTAN BENZOATE 10 MG PO TABS: 10.0000 mg | ORAL_TABLET | ORAL | 5 refills | Status: DC | PRN

## 2017-08-16 NOTE — Patient Instructions (Signed)
Great to meet you!   Sinusitis, Adult Sinusitis is soreness and inflammation of your sinuses. Sinuses are hollow spaces in the bones around your face. They are located:  Around your eyes.  In the middle of your forehead.  Behind your nose.  In your cheekbones.  Your sinuses and nasal passages are lined with a stringy fluid (mucus). Mucus normally drains out of your sinuses. When your nasal tissues get inflamed or swollen, the mucus can get trapped or blocked so air cannot flow through your sinuses. This lets bacteria, viruses, and funguses grow, and that leads to infection. Follow these instructions at home: Medicines  Take, use, or apply over-the-counter and prescription medicines only as told by your doctor. These may include nasal sprays.  If you were prescribed an antibiotic medicine, take it as told by your doctor. Do not stop taking the antibiotic even if you start to feel better. Hydrate and Humidify  Drink enough water to keep your pee (urine) clear or pale yellow.  Use a cool mist humidifier to keep the humidity level in your home above 50%.  Breathe in steam for 10-15 minutes, 3-4 times a day or as told by your doctor. You can do this in the bathroom while a hot shower is running.  Try not to spend time in cool or dry air. Rest  Rest as much as possible.  Sleep with your head raised (elevated).  Make sure to get enough sleep each night. General instructions  Put a warm, moist washcloth on your face 3-4 times a day or as told by your doctor. This will help with discomfort.  Wash your hands often with soap and water. If there is no soap and water, use hand sanitizer.  Do not smoke. Avoid being around people who are smoking (secondhand smoke).  Keep all follow-up visits as told by your doctor. This is important. Contact a doctor if:  You have a fever.  Your symptoms get worse.  Your symptoms do not get better within 10 days. Get help right away if:  You  have a very bad headache.  You cannot stop throwing up (vomiting).  You have pain or swelling around your face or eyes.  You have trouble seeing.  You feel confused.  Your neck is stiff.  You have trouble breathing. This information is not intended to replace advice given to you by your health care provider. Make sure you discuss any questions you have with your health care provider. Document Released: 04/20/2008 Document Revised: 06/28/2016 Document Reviewed: 08/28/2015 Elsevier Interactive Patient Education  2018 [employer] Inc.  

## 2017-08-16 NOTE — Progress Notes (Signed)
   HPI  Patient presents today here for concern for sinus infection.  Patient states that she's had discrete painful facial pain for about 4 days, she states that it's been ramping up pain developing over the last 10-12 days. She denies fever, chills, sweats. She's tolerating food and fluids with usual. She has had this multiple times, she uses Flonase. She has not tried nasal saline rinses  He also requests Maxalt refill, states she uses 2-3 pills a month for migraine headaches.  Patient also requests Diflucan 2 to help with used infection from antibiotics.  PMH: Smoking status noted ROS: Per HPI  Objective: BP 120/80   Pulse 83   Temp 98.8 F (37.1 C) (Oral)   Ht  (1.676 m)   Wt 167 lb 12.8 oz (76.1 kg)   BMI 27.08 kg/m  Gen: NAD, alert, cooperative with exam HEENT: NCAT, tenderness to palpation of sinuses and bilateral maxillary and frontal areas. TMs normal bilaterally, oropharynx moist, nares clear CV: RRR, good S1/S2, no murmur Resp: CTABL, no wheezes, non-labored Ext: No edema, warm Neuro: Alert and oriented, No gross deficits  Assessment and plan:  # Acute recurrent pansinusitis Treat with Augmentin Discussed nasal saline rinses, continue Flonase  # Migraine headache Refill Maxalt Controlled with a less than 4 headaches a month    Meds ordered this encounter  Medications  . rizatriptan (MAXALT) 10 MG tablet    Sig: Take 1 tablet (10 mg total) by mouth as needed. May repeat in 2 hours if needed    Dispense:  10 tablet    Refill:  5  . amoxicillin-clavulanate (AUGMENTIN) 875-125 MG tablet    Sig: Take 1 tablet by mouth 2 (two) times daily.    Dispense:  20 tablet    Refill:  0  . fluconazole (DIFLUCAN) 150 MG tablet    Sig: Take one pill and repeat in 3 days    Dispense:  2 tablet    Refill:  0    Murtis Sink, MD [facility] 08/16/2017, 4:28 PM

## 2017-08-19 ENCOUNTER — Ambulatory Visit: Payer: Medicare Other | Admitting: Family Medicine

## 2017-09-02 ENCOUNTER — Encounter: Payer: Self-pay | Admitting: Nurse Practitioner

## 2017-09-02 ENCOUNTER — Ambulatory Visit (INDEPENDENT_AMBULATORY_CARE_PROVIDER_SITE_OTHER): Payer: Medicare Other | Admitting: Nurse Practitioner

## 2017-09-02 VITALS — BP 159/98 | HR 85 | Temp 97.7°F | Ht 66.0 in | Wt 166.0 lb

## 2017-09-02 DIAGNOSIS — K219 Gastro-esophageal reflux disease without esophagitis: Secondary | ICD-10-CM

## 2017-09-02 DIAGNOSIS — K130 Diseases of lips: Secondary | ICD-10-CM | POA: Diagnosis not present

## 2017-09-02 MED ORDER — NYSTATIN 100000 UNIT/ML MT SUSP: 5.0000 mL | Freq: Four times a day (QID) | OROMUCOSAL | 0 refills | Status: DC

## 2017-09-02 MED ORDER — PANTOPRAZOLE SODIUM 40 MG PO TBEC: 40.0000 mg | DELAYED_RELEASE_TABLET | Freq: Every day | ORAL | 3 refills | Status: DC

## 2017-09-02 NOTE — Progress Notes (Signed)
   Subjective:    Patient ID: Erin Donaldson, female    DOB: [birth year], 66 y.o.   MRN: [id number]  HPI Patient comes into the night clinic c/o severe gastric reflux that started 2 days ago. She is on omperazole but she has had a constant burning sensation in anterior chest. Tums has helped relieve symptoms some    Review of Systems  Constitutional: Positive for appetite change (decreased).  HENT: Negative.   Respiratory: Negative.   Cardiovascular: Negative.   Gastrointestinal: Negative for abdominal pain, blood in stool, constipation, diarrhea, nausea and vomiting.  Genitourinary: Negative.   Neurological: Negative.   Psychiatric/Behavioral: Negative.   All other systems reviewed and are negative.      Objective:   Physical Exam  Constitutional: She is oriented to person, place, and time. She appears well-developed and well-nourished. No distress.  HENT:  Erythematous red lines bil mouth creases  Cardiovascular: Normal rate and regular rhythm.   Pulmonary/Chest: Effort normal and breath sounds normal.  Abdominal: Soft. She exhibits no distension and no mass. There is no tenderness. There is no rebound and no guarding.  Neurological: She is alert and oriented to person, place, and time.  Psychiatric: She has a normal mood and affect. Her behavior is normal. Judgment and thought content normal.   BP (!) 159/98   Pulse 85   Temp 97.7 F (36.5 C) (Oral)   Ht 5\' 6"  (1.676 m)   Wt 166 lb (75.3 kg)   BMI 26.79 kg/m       Assessment & Plan:  1. Angular cheilitis Keep area dry - nystatin (MYCOSTATIN) 100000 UNIT/ML suspension; Take 5 mLs (500,000 Units total) by mouth 4 (four) times daily.  Dispense: 100 mL; Refill: 0  2. Gastroesophageal reflux disease without esophagitis Avoid spicy foods Do not eat 2 hours prior to bedtime - pantoprazole (PROTONIX) 40 MG tablet; Take 1 tablet (40 mg total) by mouth daily.  Dispense: 30 tablet; Refill: 3  Mary-Margaret Daphine DeutscherMartin,  FNP

## 2017-09-02 NOTE — Patient Instructions (Signed)
Food Choices for Gastroesophageal Reflux Disease, Adult When you have gastroesophageal reflux disease (GERD), the foods you eat and your eating habits are very important. Choosing the right foods can help ease your discomfort. What guidelines do I need to follow?  Choose fruits, vegetables, whole grains, and low-fat dairy products.  Choose low-fat meat, fish, and poultry.  Limit fats such as oils, salad dressings, butter, nuts, and avocado.  Keep a food diary. This helps you identify foods that cause symptoms.  Avoid foods that cause symptoms. These may be different for everyone.  Eat small meals often instead of 3 large meals a day.  Eat your meals slowly, in a place where you are relaxed.  Limit fried foods.  Cook foods using methods other than frying.  Avoid drinking alcohol.  Avoid drinking large amounts of liquids with your meals.  Avoid bending over or lying down until 2-3 hours after eating. What foods are not recommended? These are some foods and drinks that may make your symptoms worse: Vegetables  Tomatoes. Tomato juice. Tomato and spaghetti sauce. Chili peppers. Onion and garlic. Horseradish. Fruits  Oranges, grapefruit, and lemon (fruit and juice). Meats  High-fat meats, fish, and poultry. This includes hot dogs, ribs, ham, sausage, salami, and bacon. Dairy  Whole milk and chocolate milk. Sour cream. Cream. Butter. Ice cream. Cream cheese. Drinks  Coffee and tea. Bubbly (carbonated) drinks or energy drinks. Condiments  Hot sauce. Barbecue sauce. Sweets/Desserts  Chocolate and cocoa. Donuts. Peppermint and spearmint. Fats and Oils  High-fat foods. This includes French fries and potato chips. Other  Vinegar. Strong spices. This includes black pepper, white pepper, red pepper, cayenne, curry powder, cloves, ginger, and chili powder. The items listed above may not be a complete list of foods and drinks to avoid. Contact your dietitian for more information.    This information is not intended to replace advice given to you by your health care provider. Make sure you discuss any questions you have with your health care provider. Document Released: 05/03/2012 Document Revised: 04/09/2016 Document Reviewed: 09/06/2013 Elsevier Interactive Patient Education  2017 Elsevier Inc.  

## 2017-09-10 ENCOUNTER — Encounter: Payer: Self-pay | Admitting: Gastroenterology

## 2017-09-10 ENCOUNTER — Telehealth: Payer: Self-pay | Admitting: Nurse Practitioner

## 2017-09-10 ENCOUNTER — Other Ambulatory Visit: Payer: Self-pay | Admitting: Family Medicine

## 2017-09-10 DIAGNOSIS — M549 Dorsalgia, unspecified: Secondary | ICD-10-CM

## 2017-09-10 DIAGNOSIS — K219 Gastro-esophageal reflux disease without esophagitis: Secondary | ICD-10-CM

## 2017-09-10 NOTE — Telephone Encounter (Signed)
Patient aware and referral placed for GI.

## 2017-09-10 NOTE — Telephone Encounter (Signed)
Patient states that protonix is not really helping. She states that she has even taking 2 a day. Patient also states that she is really watching what she eats. Please review

## 2017-09-10 NOTE — Telephone Encounter (Signed)
Needs to see GI- has she ever seen one before?

## 2017-09-29 ENCOUNTER — Ambulatory Visit: Payer: Medicare Other | Admitting: Family Medicine

## 2017-10-04 ENCOUNTER — Ambulatory Visit: Payer: Medicare Other | Admitting: Family Medicine

## 2017-10-18 ENCOUNTER — Ambulatory Visit: Payer: Medicare Other | Admitting: Family Medicine

## 2017-10-18 ENCOUNTER — Encounter: Payer: Self-pay | Admitting: Family Medicine

## 2017-10-18 ENCOUNTER — Ambulatory Visit (INDEPENDENT_AMBULATORY_CARE_PROVIDER_SITE_OTHER): Payer: Medicare Other | Admitting: Family Medicine

## 2017-10-18 VITALS — BP 125/84 | HR 100 | Temp 98.0°F | Ht 66.0 in | Wt 164.0 lb

## 2017-10-18 DIAGNOSIS — E785 Hyperlipidemia, unspecified: Secondary | ICD-10-CM

## 2017-10-18 DIAGNOSIS — E1169 Type 2 diabetes mellitus with other specified complication: Secondary | ICD-10-CM | POA: Diagnosis not present

## 2017-10-18 DIAGNOSIS — I10 Essential (primary) hypertension: Secondary | ICD-10-CM | POA: Diagnosis not present

## 2017-10-18 DIAGNOSIS — D509 Iron deficiency anemia, unspecified: Secondary | ICD-10-CM

## 2017-10-18 DIAGNOSIS — R6889 Other general symptoms and signs: Secondary | ICD-10-CM | POA: Diagnosis not present

## 2017-10-18 DIAGNOSIS — E559 Vitamin D deficiency, unspecified: Secondary | ICD-10-CM | POA: Diagnosis not present

## 2017-10-18 DIAGNOSIS — K219 Gastro-esophageal reflux disease without esophagitis: Secondary | ICD-10-CM | POA: Diagnosis not present

## 2017-10-18 DIAGNOSIS — Z23 Encounter for immunization: Secondary | ICD-10-CM

## 2017-10-18 LAB — BAYER DCA HB A1C WAIVED: HB A1C (BAYER DCA - WAIVED): 6.5 % (ref <7.0)

## 2017-10-18 MED ORDER — PANTOPRAZOLE SODIUM 40 MG PO TBEC: 80.0000 mg | DELAYED_RELEASE_TABLET | Freq: Every day | ORAL | 3 refills | Status: DC

## 2017-10-18 MED ORDER — FLUCONAZOLE 150 MG PO TABS: 150.0000 mg | ORAL_TABLET | Freq: Every day | ORAL | 1 refills | Status: DC

## 2017-10-18 NOTE — Patient Instructions (Addendum)
Medicare Annual Wellness Visit  [place] and the medical providers at [facility] strive to bring you the best medical care.  In doing so we not only want to address your current medical conditions and concerns but also to detect new conditions early and prevent illness, disease and health-related problems.    Medicare offers a yearly Wellness Visit which allows our clinical staff to assess your need for preventative services including immunizations, lifestyle education, counseling to decrease risk of preventable diseases and screening for fall risk and other medical concerns.    This visit is provided free of charge (no copay) for all Medicare recipients. The clinical pharmacists at [facility] have begun to conduct these Wellness Visits which will also include a thorough review of all your medications.    As you primary medical provider recommend that you make an appointment for your Annual Wellness Visit if you have not done so already this year.  You may set up this appointment before you leave today or you may call back [phone] and schedule an appointment.  Please make sure when you call that you mention that you are scheduling your Annual Wellness Visit with the clinical pharmacist so that the appointment may be made for the proper length of time.     Continue current medications. Continue good therapeutic lifestyle changes which include good diet and exercise. Fall precautions discussed with patient. If an FOBT was given today- please return it to our front desk. If you are over 64 years old - you may need Prevnar 13 or the adult Pneumonia vaccine.  **Flu shots are available--- please call and schedule a FLU-CLINIC appointment**  After your visit with us today you will receive a survey in the mail or online from [employer] regarding your care with us. Please take a moment to fill this out. Your feedback is very  important to us as you can help us better understand your patient needs as well as improve your experience and satisfaction. WE CARE ABOUT YOU!!!   Patient will call and make arrangements to get her mammogram. We will call with lab work results as soon as they become available. Diflucan 1 daily for 3 days Continue to follow-up with psychiatry and neurology

## 2017-10-18 NOTE — Progress Notes (Signed)
Subjective:    Patient ID: Erin Donaldson, female    DOB: 1951-10-22, 66 y.o.   MRN: [id number]  HPI Pt here for follow up and management of chronic medical problems which includes hypertension and diabetes. She is taking medication regularly.  Patient is complaining with increased problems with GERD.  She also is concerned she may have a yeast infection.  She is past due on her mammogram and today will get lab work and a flu shot.  She is also due to get a colonoscopy.  The patient denies any chest pain or shortness of breath.  She did have a severe episode of GERD and since that time is had more trouble.  We will have her take the Protonix 1 twice daily at least through the Christmas season and then try to reduce this again after the Christmas season to once daily.  She denies any trouble with passing her water other than she knows she Diflucan.  The patient is pleasant and alert and continues to see the psychiatrist regularly as well as the neurologist.  She has been doing quite well for several years.     Patient Active Problem List   Diagnosis Date Noted  . Iron deficiency anemia 01/24/2016  . Swelling of right lower extremity 07/15/2015  . Right hip pain 07/15/2015  . Microcytic anemia 07/15/2015  . Elevated LFTs   . History of gastric bypass 01/15/2014  . Amnestic disorder due to medical condition 08/23/2013  . Diarrhea 07/19/2013  . BIPOLAR AFFECTIVE DISORDER 08/26/2009  . ANXIETY 08/26/2009  . MIGRAINE HEADACHE 08/26/2009  . Type 2 diabetes mellitus with hyperlipidemia ([place]) 08/15/2009  . Hyperlipidemia 08/15/2009  . Essential hypertension 08/15/2009  . GERD 08/15/2009  . CONSTIPATION 08/15/2009  . IRRITABLE BOWEL SYNDROME 08/15/2009   Outpatient Encounter Medications as of 10/18/2017  Medication Sig  . cyclobenzaprine (FLEXERIL) 10 MG tablet TAKE 1/2 TO 1 TAB TWICE A DAY AS DIRECTED  . donepezil (ARICEPT) 10 MG tablet TAKE 1 TABLET (10 MG TOTAL) BY MOUTH DAILY.  .  fluticasone (FLONASE) 50 MCG/ACT nasal spray PLACE 2 SPRAYS INTO BOTH NOSTRILS DAILY.  . furosemide (LASIX) 20 MG tablet TAKE 1 TABLET BY MOUTH EVERY DAY  . gabapentin (NEURONTIN) 300 MG capsule TAKE 3 CAPSULE BY MOUTH THREE TIMES A DAY  . lamoTRIgine (LAMICTAL) 200 MG tablet Take 200 mg by mouth daily.  . meclizine (ANTIVERT) 12.5 MG tablet Take 1 tablet (12.5 mg total) by mouth 3 (three) times daily as needed for dizziness.  . methocarbamol (ROBAXIN) 750 MG tablet TAKE 1 TABLET (750 MG TOTAL) BY MOUTH 4 (FOUR) TIMES DAILY.  Marland Kitchen nystatin (MYCOSTATIN) 100000 UNIT/ML suspension Take 5 mLs (500,000 Units total) by mouth 4 (four) times daily.  . pantoprazole (PROTONIX) 40 MG tablet Take 1 tablet (40 mg total) by mouth daily.  . QUEtiapine (SEROQUEL) 200 MG tablet Take 200 mg by mouth daily.  . QUEtiapine (SEROQUEL) 400 MG tablet Take 800 mg by mouth at bedtime.   . rizatriptan (MAXALT) 10 MG tablet Take 1 tablet (10 mg total) by mouth as needed. May repeat in 2 hours if needed  . [DISCONTINUED] fluconazole (DIFLUCAN) 150 MG tablet Take one pill and repeat in 3 days  . [DISCONTINUED] nystatin cream (MYCOSTATIN) APPLY 1 APPLICATION TOPICALLY 2 (TWO) TIMES DAILY.  . [DISCONTINUED] Vitamin D, Ergocalciferol, (DRISDOL) 50000 UNITS CAPS capsule Take 1 capsule (50,000 Units total) by mouth every 7 (seven) days.   No facility-administered encounter medications on file as  of 10/18/2017.      Review of Systems  Constitutional: Negative.   HENT: Negative.   Eyes: Negative.   Respiratory: Negative.   Cardiovascular: Negative.   Gastrointestinal: Negative.        GERD  Endocrine: Negative.   Genitourinary: Positive for vaginal discharge.  Musculoskeletal: Negative.   Skin: Negative.   Allergic/Immunologic: Negative.   Neurological: Negative.   Hematological: Negative.   Psychiatric/Behavioral: Negative.        Objective:   Physical Exam  Constitutional: She is oriented to person, place, and time.  She appears well-developed and well-nourished.  Pleasant and alert and adopting her grandchild and looking after her elderly mother-in-law  HENT:  Head: Normocephalic and atraumatic.  Right Ear: External ear normal.  Left Ear: External ear normal.  Nose: Nose normal.  Mouth/Throat: Oropharynx is clear and moist.  Eyes: Conjunctivae and EOM are normal. Pupils are equal, round, and reactive to light. Right eye exhibits no discharge. Left eye exhibits no discharge. No scleral icterus.  Neck: Normal range of motion. Neck supple. No thyromegaly present.  No bruits thyromegaly or anterior cervical adenopathy  Cardiovascular: Normal rate, regular rhythm, normal heart sounds and intact distal pulses.  No murmur heard. Pulmonary/Chest: Effort normal and breath sounds normal. No respiratory distress. She has no wheezes. She has no rales.  Clear anteriorly and posteriorly  Abdominal: Soft. Bowel sounds are normal. She exhibits no mass. There is no tenderness. There is no rebound and no guarding.  No abdominal tenderness masses or bruits.  Patient refuses to do a colonoscopy  Musculoskeletal: Normal range of motion. She exhibits no edema or tenderness.  Lymphadenopathy:    She has no cervical adenopathy.  Neurological: She is alert and oriented to person, place, and time. She has normal reflexes. No cranial nerve deficit.  Skin: Skin is warm and dry. No rash noted.  Psychiatric: She has a normal mood and affect. Her behavior is normal. Judgment and thought content normal.  Nursing note and vitals reviewed.  BP (!) 129/93 (BP Location: Left Arm)   Pulse 100   Temp 98 F (36.7 C) (Oral)   Ht 5' 6" (1.676 m)   Wt 164 lb (74.4 kg)   BMI 26.47 kg/m         Assessment & Plan:  1. Type 2 diabetes mellitus with hyperlipidemia ([place]) -Patient is currently not checking her blood sugars at all.  She has not had any problems with her diabetes since she had her gastric bypass. - BMP8+EGFR - Bayer DCA  Hb A1c Waived  2. Hyperlipidemia, unspecified hyperlipidemia type -Continue with aggressive therapeutic lifestyle changes pending results of lab work - Lipid panel  3. Essential hypertension -The blood pressure is stable and improved on the second reading today.  She will continue with her Lasix 20 mg daily. - BMP8+EGFR - Hepatic function panel  4. Gastroesophageal reflux disease, esophagitis presence not specified -The patient has had more trouble with reflux since an episode of about 6 weeks ago.  We will change her pantoprazole so that she can take 1 twice a day before breakfast and supper on a regular basis at least through the Christmas season and then she will try to reduce this again to once daily before breakfast. -She will continue to watch her diet closely - Hepatic function panel  5. Iron deficiency anemia, unspecified iron deficiency anemia type - Anemia Profile B  6. Vitamin D deficiency - VITAMIN D 25 Hydroxy (Vit-D Deficiency, Fractures)  7.  Gastroesophageal reflux disease without esophagitis - pantoprazole (PROTONIX) 40 MG tablet; Take 2 tablets (80 mg total) by mouth daily.  Dispense: 180 tablet; Refill: 3  Meds ordered this encounter  Medications  . fluconazole (DIFLUCAN) 150 MG tablet    Sig: Take 1 tablet (150 mg total) by mouth daily.    Dispense:  3 tablet    Refill:  1  . pantoprazole (PROTONIX) 40 MG tablet    Sig: Take 2 tablets (80 mg total) by mouth daily.    Dispense:  180 tablet    Refill:  3   Patient Instructions                       Medicare Annual Wellness Visit  [place] and the medical providers at [facility] strive to bring you the best medical care.  In doing so we not only want to address your current medical conditions and concerns but also to detect new conditions early and prevent illness, disease and health-related problems.    Medicare offers a yearly Wellness Visit which allows our clinical staff to  assess your need for preventative services including immunizations, lifestyle education, counseling to decrease risk of preventable diseases and screening for fall risk and other medical concerns.    This visit is provided free of charge (no copay) for all Medicare recipients. The clinical pharmacists at [facility] have begun to conduct these Wellness Visits which will also include a thorough review of all your medications.    As you primary medical provider recommend that you make an appointment for your Annual Wellness Visit if you have not done so already this year.  You may set up this appointment before you leave today or you may call back [phone]) and schedule an appointment.  Please make sure when you call that you mention that you are scheduling your Annual Wellness Visit with the clinical pharmacist so that the appointment may be made for the proper length of time.     Continue current medications. Continue good therapeutic lifestyle changes which include good diet and exercise. Fall precautions discussed with patient. If an FOBT was given today- please return it to our front desk. If you are over 46 years old - you may need Prevnar 66 or the adult Pneumonia vaccine.  **Flu shots are available--- please call and schedule a FLU-CLINIC appointment**  After your visit with [place] today you will receive a survey in the mail or online from [employer] regarding your care with [place]. Please take a moment to fill this out. Your feedback is very important to [place] as you can help [place] better understand your patient needs as well as improve your experience and satisfaction. WE CARE ABOUT YOU!!!   Patient will call and make arrangements to get her mammogram. We will call with lab work results as soon as they become available. Diflucan 1 daily for 3 days Continue to follow-up with psychiatry and neurology    Arrie Senate MD

## 2017-10-19 ENCOUNTER — Other Ambulatory Visit: Payer: Self-pay | Admitting: *Deleted

## 2017-10-19 LAB — LIPID PANEL
Chol/HDL Ratio: 2.8 ratio (ref 0.0–4.4)
Cholesterol, Total: 163 mg/dL (ref 100–199)
HDL: 58 mg/dL (ref >39)
LDL Calculated: 81 mg/dL (ref 0–99)
Triglycerides: 122 mg/dL (ref 0–149)
VLDL Cholesterol Cal: 24 mg/dL (ref 5–40)

## 2017-10-19 LAB — ANEMIA PROFILE B
Basophils Absolute: 0.1 10*3/uL (ref 0.0–0.2)
Basos: 1 %
EOS (ABSOLUTE): 0.2 10*3/uL (ref 0.0–0.4)
Eos: 2 %
Ferritin: 17 ng/mL (ref 15–150)
Folate: 2.7 ng/mL — ABNORMAL LOW (ref >3.0)
Hematocrit: 38.9 % (ref 34.0–46.6)
Hemoglobin: 13 g/dL (ref 11.1–15.9)
Immature Grans (Abs): 0 10*3/uL (ref 0.0–0.1)
Immature Granulocytes: 0 %
Iron Saturation: 12 % — ABNORMAL LOW (ref 15–55)
Iron: 39 ug/dL (ref 27–139)
Lymphocytes Absolute: 2.2 10*3/uL (ref 0.7–3.1)
Lymphs: 23 %
MCH: 27.5 pg (ref 26.6–33.0)
MCHC: 33.4 g/dL (ref 31.5–35.7)
MCV: 82 fL (ref 79–97)
Monocytes Absolute: 0.6 10*3/uL (ref 0.1–0.9)
Monocytes: 6 %
Neutrophils Absolute: 6.5 10*3/uL (ref 1.4–7.0)
Neutrophils: 68 %
Platelets: 307 10*3/uL (ref 150–379)
RBC: 4.72 x10E6/uL (ref 3.77–5.28)
RDW: 14.5 % (ref 12.3–15.4)
Retic Ct Pct: 1.1 % (ref 0.6–2.6)
Total Iron Binding Capacity: 326 ug/dL (ref 250–450)
UIBC: 287 ug/dL (ref 118–369)
Vitamin B-12: 256 pg/mL (ref 232–1245)
WBC: 9.6 10*3/uL (ref 3.4–10.8)

## 2017-10-19 LAB — HEPATIC FUNCTION PANEL
ALT: 12 IU/L (ref 0–32)
AST: 16 IU/L (ref 0–40)
Albumin: 4.6 g/dL (ref 3.6–4.8)
Alkaline Phosphatase: 241 IU/L — ABNORMAL HIGH (ref 39–117)
Bilirubin Total: <0.2 mg/dL (ref 0.0–1.2)
Bilirubin, Direct: 0.07 mg/dL (ref 0.00–0.40)
Total Protein: 7 g/dL (ref 6.0–8.5)

## 2017-10-19 LAB — BMP8+EGFR
BUN/Creatinine Ratio: 12 (ref 12–28)
BUN: 12 mg/dL (ref 8–27)
CO2: 22 mmol/L (ref 20–29)
Calcium: 9.2 mg/dL (ref 8.7–10.3)
Chloride: 104 mmol/L (ref 96–106)
Creatinine, Ser: 1 mg/dL (ref 0.57–1.00)
GFR calc Af Amer: 69 mL/min/{1.73_m2} (ref >59)
GFR calc non Af Amer: 60 mL/min/{1.73_m2} (ref >59)
Glucose: 96 mg/dL (ref 65–99)
Potassium: 4.8 mmol/L (ref 3.5–5.2)
Sodium: 143 mmol/L (ref 134–144)

## 2017-10-19 LAB — VITAMIN D 25 HYDROXY (VIT D DEFICIENCY, FRACTURES): Vit D, 25-Hydroxy: 8 ng/mL — ABNORMAL LOW (ref 30.0–100.0)

## 2017-10-19 MED ORDER — VITAMIN D (ERGOCALCIFEROL) 1.25 MG (50000 UNIT) PO CAPS: 50000.0000 [IU] | ORAL_CAPSULE | ORAL | 3 refills | Status: DC

## 2017-10-20 ENCOUNTER — Ambulatory Visit: Payer: Medicare Other | Admitting: Cardiology

## 2017-10-20 NOTE — Progress Notes (Deleted)
Cardiology Office Note   Date:  10/20/2017   ID:  Reynold BowenLynn B Ricco, DOB 26-Mar-1951, MRN [id number]  PCP:  Ernestina PennaMoore, Donald W, MD  Cardiologist:   Rollene RotundaJames Azelie Noguera, MD  Referring:  ***  No chief complaint on file.     History of Present Illness: Erin Donaldson is a 66 y.o. female who presents for ***     Past Medical History:  Diagnosis Date  . Anemia   . Anxiety   . Bipolar affective (HCC)   . CVA (cerebral infarction)   . Depression   . Diabetes in pregnancy   . Diabetes mellitus   . Elevated LFTs   . GERD (gastroesophageal reflux disease)   . Hyperlipidemia   . Hypertension   . IBS (irritable bowel syndrome)   . Memory loss   . Migraine     Past Surgical History:  Procedure Laterality Date  . CHOLECYSTECTOMY    . GASTRIC BYPASS  2005     Current Outpatient Medications  Medication Sig Dispense Refill  . cyclobenzaprine (FLEXERIL) 10 MG tablet TAKE 1/2 TO 1 TAB TWICE A DAY AS DIRECTED 30 tablet 1  . donepezil (ARICEPT) 10 MG tablet TAKE 1 TABLET (10 MG TOTAL) BY MOUTH DAILY. 90 tablet 0  . fluconazole (DIFLUCAN) 150 MG tablet Take 1 tablet (150 mg total) by mouth daily. 3 tablet 1  . fluticasone (FLONASE) 50 MCG/ACT nasal spray PLACE 2 SPRAYS INTO BOTH NOSTRILS DAILY. 16 g 2  . furosemide (LASIX) 20 MG tablet TAKE 1 TABLET BY MOUTH EVERY DAY 30 tablet 0  . gabapentin (NEURONTIN) 300 MG capsule TAKE 3 CAPSULE BY MOUTH THREE TIMES A DAY 270 capsule 3  . lamoTRIgine (LAMICTAL) 200 MG tablet Take 200 mg by mouth daily.    . meclizine (ANTIVERT) 12.5 MG tablet Take 1 tablet (12.5 mg total) by mouth 3 (three) times daily as needed for dizziness. 30 tablet 0  . methocarbamol (ROBAXIN) 750 MG tablet TAKE 1 TABLET (750 MG TOTAL) BY MOUTH 4 (FOUR) TIMES DAILY. 120 tablet 0  . nystatin (MYCOSTATIN) 100000 UNIT/ML suspension Take 5 mLs (500,000 Units total) by mouth 4 (four) times daily. 100 mL 0  . pantoprazole (PROTONIX) 40 MG tablet Take 2 tablets (80 mg total) by mouth  daily. 180 tablet 3  . QUEtiapine (SEROQUEL) 200 MG tablet Take 200 mg by mouth daily.    . QUEtiapine (SEROQUEL) 400 MG tablet Take 800 mg by mouth at bedtime.     . rizatriptan (MAXALT) 10 MG tablet Take 1 tablet (10 mg total) by mouth as needed. May repeat in 2 hours if needed 10 tablet 5  . Vitamin D, Ergocalciferol, (DRISDOL) 50000 units CAPS capsule Take 1 capsule (50,000 Units total) by mouth every 7 (seven) days. 12 capsule 3   No current facility-administered medications for this visit.     Allergies:   Darvocet [propoxyphene n-acetaminophen]; Decongestant [oxymetazoline]; Elavil [amitriptyline]; Risperidone and related; and Ultram [tramadol]    Social History:  The patient  reports that  has never smoked. she has never used smokeless tobacco. She reports that she does not drink alcohol or use drugs.   Family History:  The patient's ***family history includes Breast cancer in her mother; Cancer in her mother; Diabetes in her father, maternal aunt, maternal uncle, paternal aunt, and paternal uncle; Heart disease in her brother, father, maternal aunt, maternal grandfather, maternal uncle, mother, paternal aunt, and paternal uncle; Kidney disease in her father.    ROS:  Please see the history of present illness.   Otherwise, review of systems are positive for {NONE DEFAULTED:18576::"none"}.   All other systems are reviewed and negative.    PHYSICAL EXAM: VS:  There were no vitals taken for this visit. , BMI There is no height or weight on file to calculate BMI. GENERAL:  Well appearing HEENT:  Pupils equal round and reactive, fundi not visualized, oral mucosa unremarkable NECK:  No jugular venous distention, waveform within normal limits, carotid upstroke brisk and symmetric, no bruits, no thyromegaly LYMPHATICS:  No cervical, inguinal adenopathy LUNGS:  Clear to auscultation bilaterally BACK:  No CVA tenderness CHEST:  Unremarkable HEART:  PMI not displaced or sustained,S1 and S2  within normal limits, no S3, no S4, no clicks, no rubs, *** murmurs ABD:  Flat, positive bowel sounds normal in frequency in pitch, no bruits, no rebound, no guarding, no midline pulsatile mass, no hepatomegaly, no splenomegaly EXT:  2 plus pulses throughout, no edema, no cyanosis no clubbing SKIN:  No rashes no nodules NEURO:  Cranial nerves II through XII grossly intact, motor grossly intact throughout PSYCH:  Cognitively intact, oriented to person place and time    EKG:  EKG {ACTION; IS/IS [id number]}[id number]} ordered today. The ekg ordered today demonstrates ***   Recent Labs: 10/18/2017: ALT 12; BUN 12; Creatinine, Ser 1.00; Hemoglobin 13.0; Platelets 307; Potassium 4.8; Sodium 143    Lipid Panel    Component Value Date/Time   CHOL 163 10/18/2017 1638   CHOL 195 04/17/2013 1135   TRIG 122 10/18/2017 1638   TRIG 66 01/17/2014 1050   TRIG 62 04/17/2013 1135   HDL 58 10/18/2017 1638   HDL 85 01/17/2014 1050   HDL 88 04/17/2013 1135   CHOLHDL 2.8 10/18/2017 1638   LDLCALC 81 10/18/2017 1638   LDLCALC 78 01/17/2014 1050   LDLCALC 95 04/17/2013 1135      Wt Readings from Last 3 Encounters:  10/18/17 164 lb (74.4 kg)  09/02/17 166 lb (75.3 kg)  08/16/17 167 lb 12.8 oz (76.1 kg)      Other studies Reviewed: Additional studies/ records that were reviewed today include: ***. Review of the above records demonstrates:  Please see elsewhere in the note.  ***   ASSESSMENT AND PLAN:  ***   Current medicines are reviewed at length with the patient today.  The patient {ACTIONS; HAS/DOES NOT HAVE:19233} concerns regarding medicines.  The following changes have been made:  {PLAN; NO CHANGE:13088:s}  Labs/ tests ordered today include: *** No orders of the defined types were placed in this encounter.    Disposition:   FU with ***    Signed, Rollene RotundaJames Haven Pylant, MD  10/20/2017 12:38 PM    [facility]

## 2017-11-01 ENCOUNTER — Ambulatory Visit: Payer: Medicare Other | Admitting: Family Medicine

## 2017-11-02 ENCOUNTER — Ambulatory Visit: Payer: Medicare Other | Admitting: Gastroenterology

## 2017-11-03 ENCOUNTER — Ambulatory Visit: Payer: Medicare Other | Admitting: Cardiology

## 2017-11-30 ENCOUNTER — Encounter: Payer: Self-pay | Admitting: Nurse Practitioner

## 2017-12-22 ENCOUNTER — Encounter: Payer: Self-pay | Admitting: Cardiology

## 2017-12-28 NOTE — Progress Notes (Signed)
Cardiology Office Note   Date:  12/29/2017   ID:  Reynold BowenLynn B Cangelosi, DOB 10/17/1951, MRN [id number]  PCP:  Ernestina PennaMoore, Donald W, MD  Cardiologist:   No primary care provider on file. Referring:  Ernestina PennaMoore, Donald W, MD  Chief Complaint  Patient presents with  . Abnormal ECG      History of Present Illness: Erin KohLynn B Zweber is a 67 y.o. female who presents for the patient presents for evaluation of an abnormal EKG.  She was noted recently to have poor anterior R wave progression and a question of Q waves in the inferior lead.  She had a cardiac catheterization and I was able to find his results from 2003.  He had a 30% right coronary artery lesion.  She is active.  She is taking care of her 65-year-old great grandchild who she is going to about.  She does the chores necessary for that and also helps to take care of her elderly mother-in-law.  She lives alone except for her great grandchild.  She denies any cardiovascular symptoms. The patient denies any new symptoms such as chest discomfort, neck or arm discomfort. There has been no new shortness of breath, PND or orthopnea. There have been no reported palpitations, presyncope or syncope.     Past Medical History:  Diagnosis Date  . Anemia   . Anxiety   . Bipolar affective (HCC)   . CVA (cerebral infarction)   . Depression   . Diabetes in pregnancy   . Diabetes mellitus   . Elevated LFTs   . GERD (gastroesophageal reflux disease)   . Hyperlipidemia   . Hypertension   . IBS (irritable bowel syndrome)   . Memory loss   . Migraine     Past Surgical History:  Procedure Laterality Date  . CHOLECYSTECTOMY    . GASTRIC BYPASS  2005     Current Outpatient Medications  Medication Sig Dispense Refill  . cyclobenzaprine (FLEXERIL) 10 MG tablet TAKE 1/2 TO 1 TAB TWICE A DAY AS DIRECTED 30 tablet 1  . donepezil (ARICEPT) 10 MG tablet TAKE 1 TABLET (10 MG TOTAL) BY MOUTH DAILY. 90 tablet 0  . fluconazole (DIFLUCAN) 150 MG tablet Take 1  tablet (150 mg total) by mouth daily. 3 tablet 1  . fluticasone (FLONASE) 50 MCG/ACT nasal spray PLACE 2 SPRAYS INTO BOTH NOSTRILS DAILY. 16 g 2  . furosemide (LASIX) 20 MG tablet TAKE 1 TABLET BY MOUTH EVERY DAY 30 tablet 0  . gabapentin (NEURONTIN) 300 MG capsule TAKE 3 CAPSULE BY MOUTH THREE TIMES A DAY 270 capsule 3  . lamoTRIgine (LAMICTAL) 200 MG tablet Take 200 mg by mouth daily.    . meclizine (ANTIVERT) 12.5 MG tablet Take 1 tablet (12.5 mg total) by mouth 3 (three) times daily as needed for dizziness. 30 tablet 0  . methocarbamol (ROBAXIN) 750 MG tablet TAKE 1 TABLET (750 MG TOTAL) BY MOUTH 4 (FOUR) TIMES DAILY. 120 tablet 0  . nystatin (MYCOSTATIN) 100000 UNIT/ML suspension Take 5 mLs (500,000 Units total) by mouth 4 (four) times daily. 100 mL 0  . pantoprazole (PROTONIX) 40 MG tablet Take 2 tablets (80 mg total) by mouth daily. 180 tablet 3  . QUEtiapine (SEROQUEL) 200 MG tablet Take 200 mg by mouth daily.    . QUEtiapine (SEROQUEL) 400 MG tablet Take 800 mg by mouth at bedtime.     . rizatriptan (MAXALT) 10 MG tablet Take 1 tablet (10 mg total) by mouth as needed. May repeat  in 2 hours if needed 10 tablet 5  . Vitamin D, Ergocalciferol, (DRISDOL) 50000 units CAPS capsule Take 1 capsule (50,000 Units total) by mouth every 7 (seven) days. 12 capsule 3   No current facility-administered medications for this visit.     Allergies:   Darvocet [propoxyphene n-acetaminophen]; Decongestant [oxymetazoline]; Elavil [amitriptyline]; Risperidone and related; and Ultram [tramadol]    Social History:  The patient  reports that  has never smoked. she has never used smokeless tobacco. She reports that she does not drink alcohol or use drugs.   Family History:  The patient's family history includes Breast cancer in her mother; Cancer in her mother; Diabetes in her father, maternal aunt, maternal uncle, paternal aunt, and paternal uncle; Heart disease in her brother, maternal aunt, maternal  grandfather, maternal uncle, paternal aunt, and paternal uncle; Heart disease (age of onset: 62) in her mother; Heart disease (age of onset: 42) in her father; Kidney disease in her father.    ROS:  Please see the history of present illness.   Otherwise, review of systems are positive for none.   All other systems are reviewed and negative.    PHYSICAL EXAM: VS:  BP 115/72   Pulse 88   Ht 5\' 4"  (1.626 m)   Wt 165 lb (74.8 kg)   BMI 28.32 kg/m  , BMI Body mass index is 28.32 kg/m. GENERAL:  Well appearing HEENT:  Pupils equal round and reactive, fundi not visualized, oral mucosa unremarkable NECK:  No jugular venous distention, waveform within normal limits, carotid upstroke brisk and symmetric, no bruits, no thyromegaly LYMPHATICS:  No cervical, inguinal adenopathy LUNGS:  Clear to auscultation bilaterally BACK:  No CVA tenderness CHEST:  Unremarkable HEART:  PMI not displaced or sustained,S1 and S2 within normal limits, no S3, no S4, no clicks, no rubs, no murmurs ABD:  Flat, positive bowel sounds normal in frequency in pitch, no bruits, no rebound, no guarding, no midline pulsatile mass, no hepatomegaly, no splenomegaly EXT:  2 plus pulses throughout, no edema, no cyanosis no clubbing SKIN:  No rashes no nodules NEURO:  Cranial nerves II through XII grossly intact, motor grossly intact throughout PSYCH:  Cognitively intact, oriented to person place and time    EKG:  EKG is ordered today. The ekg ordered today demonstrates sinus rhythm, rate 88, axis within normal limits, intervals within normal limits, no acute ST-T wave changes.  DM:  A1C 6.5.    Recent Labs: 10/18/2017: ALT 12; BUN 12; Creatinine, Ser 1.00; Hemoglobin 13.0; Platelets 307; Potassium 4.8; Sodium 143    Lipid Panel    Component Value Date/Time   CHOL 163 10/18/2017 1638   CHOL 195 04/17/2013 1135   TRIG 122 10/18/2017 1638   TRIG 66 01/17/2014 1050   TRIG 62 04/17/2013 1135   HDL 58 10/18/2017 1638    HDL 85 01/17/2014 1050   HDL 88 04/17/2013 1135   CHOLHDL 2.8 10/18/2017 1638   LDLCALC 81 10/18/2017 1638   LDLCALC 78 01/17/2014 1050   LDLCALC 95 04/17/2013 1135      Wt Readings from Last 3 Encounters:  12/29/17 165 lb (74.8 kg)  10/18/17 164 lb (74.4 kg)  09/02/17 166 lb (75.3 kg)      Other studies Reviewed: Additional studies/ records that were reviewed today include: EKG.  Cath 2003. Review of the above records demonstrates:  Please see elsewhere in the note.     ASSESSMENT AND PLAN:  ABNORMAL EKG: She has a very strong  family history.  She had nonobstructive plaque previously.  I would like to bring her back for a POET (Plain [employer] Exercise Treadmill).  She will try to walk the treadmill but if she cannot tolerate this she might need a [employer].  DYSLIPIDEMIA: I reviewed her lipids and they are at target as above.  No change in therapy.  HTN: Her blood pressure is at target.  No change in therapy.  DM: Blood sugars are reasonably controlled.  He is not followed by Ernestina Penna, MD  Current medicines are reviewed at length with the patient today.  The patient does not have concerns regarding medicines.  The following changes have been made:  no change  Labs/ tests ordered today include:   Orders Placed This Encounter  Procedures  . Exercise Tolerance Test  . EKG 12-Lead     Disposition:   FU with me as needed.      Signed, Rollene Rotunda, MD  12/29/2017 4:42 PM    [facility]

## 2017-12-29 ENCOUNTER — Encounter: Payer: Self-pay | Admitting: Cardiology

## 2017-12-29 ENCOUNTER — Ambulatory Visit: Payer: Medicare Other | Admitting: Cardiology

## 2017-12-29 VITALS — BP 115/72 | HR 88 | Ht 64.0 in | Wt 165.0 lb

## 2017-12-29 DIAGNOSIS — R9431 Abnormal electrocardiogram [ECG] [EKG]: Secondary | ICD-10-CM

## 2017-12-29 DIAGNOSIS — I10 Essential (primary) hypertension: Secondary | ICD-10-CM

## 2017-12-29 DIAGNOSIS — E785 Hyperlipidemia, unspecified: Secondary | ICD-10-CM | POA: Diagnosis not present

## 2017-12-29 NOTE — Patient Instructions (Signed)
Medication Instructions:  The current medical regimen is effective;  continue present plan and medications.  Testing/Procedures: Your physician has requested that you have an exercise tolerance test. This will be completed at [facility] - please report to the Main Entrance to check in at 9 am.  Follow-Up: Follow up as needed after the above testing.  If you need a refill on your cardiac medications before your next appointment, please call your pharmacy.  Thank you for choosing  [facility]!!

## 2018-01-05 ENCOUNTER — Encounter (HOSPITAL_COMMUNITY): Payer: Medicare Other

## 2018-01-06 ENCOUNTER — Other Ambulatory Visit: Payer: Self-pay | Admitting: Family Medicine

## 2018-01-11 ENCOUNTER — Other Ambulatory Visit: Payer: Self-pay | Admitting: Family Medicine

## 2018-01-17 ENCOUNTER — Telehealth: Payer: Self-pay | Admitting: Family Medicine

## 2018-01-17 NOTE — Telephone Encounter (Signed)
Pt called

## 2018-01-22 ENCOUNTER — Other Ambulatory Visit: Payer: Self-pay | Admitting: Nurse Practitioner

## 2018-01-22 ENCOUNTER — Other Ambulatory Visit: Payer: Self-pay | Admitting: Family Medicine

## 2018-01-22 DIAGNOSIS — M549 Dorsalgia, unspecified: Secondary | ICD-10-CM

## 2018-02-02 ENCOUNTER — Ambulatory Visit: Payer: Medicare Other | Admitting: Cardiology

## 2018-02-07 ENCOUNTER — Ambulatory Visit (HOSPITAL_COMMUNITY): Payer: Medicare Other | Attending: Cardiology

## 2018-02-16 ENCOUNTER — Other Ambulatory Visit: Payer: Self-pay | Admitting: Family Medicine

## 2018-02-21 ENCOUNTER — Other Ambulatory Visit: Payer: Self-pay | Admitting: *Deleted

## 2018-02-21 MED ORDER — GABAPENTIN 300 MG PO CAPS: ORAL_CAPSULE | ORAL | 0 refills | Status: DC

## 2018-02-23 ENCOUNTER — Other Ambulatory Visit: Payer: Self-pay | Admitting: Family Medicine

## 2018-02-23 ENCOUNTER — Ambulatory Visit: Payer: Medicare Other | Admitting: Family Medicine

## 2018-03-02 ENCOUNTER — Telehealth: Payer: Self-pay | Admitting: Family Medicine

## 2018-03-02 ENCOUNTER — Other Ambulatory Visit: Payer: Self-pay | Admitting: Family Medicine

## 2018-03-02 MED ORDER — OSELTAMIVIR PHOSPHATE 75 MG PO CAPS: 75.0000 mg | ORAL_CAPSULE | Freq: Every day | ORAL | 0 refills | Status: AC

## 2018-03-02 NOTE — Telephone Encounter (Signed)
Tamiflu prophylaxis sent to the pharmacy.  Please advised that this may cause nausea.

## 2018-03-02 NOTE — Telephone Encounter (Signed)
Please advise 

## 2018-03-03 NOTE — Telephone Encounter (Signed)
Patient notified

## 2018-03-09 ENCOUNTER — Ambulatory Visit (INDEPENDENT_AMBULATORY_CARE_PROVIDER_SITE_OTHER): Payer: Medicare Other | Admitting: Family Medicine

## 2018-03-09 ENCOUNTER — Encounter: Payer: Self-pay | Admitting: Family Medicine

## 2018-03-09 VITALS — BP 109/67 | HR 93 | Temp 97.9°F | Ht 64.0 in | Wt 170.0 lb

## 2018-03-09 DIAGNOSIS — R05 Cough: Secondary | ICD-10-CM | POA: Diagnosis not present

## 2018-03-09 DIAGNOSIS — R059 Cough, unspecified: Secondary | ICD-10-CM

## 2018-03-09 MED ORDER — FLUCONAZOLE 150 MG PO TABS: 150.0000 mg | ORAL_TABLET | Freq: Every day | ORAL | 1 refills | Status: DC

## 2018-03-09 MED ORDER — BETAMETHASONE SOD PHOS & ACET 6 (3-3) MG/ML IJ SUSP
6.0000 mg | Freq: Once | INTRAMUSCULAR | Status: AC
  Administered 2018-03-09: 6 mg via INTRAMUSCULAR

## 2018-03-09 MED ORDER — AMOXICILLIN-POT CLAVULANATE 875-125 MG PO TABS: 1.0000 | ORAL_TABLET | Freq: Two times a day (BID) | ORAL | 0 refills | Status: DC

## 2018-03-15 ENCOUNTER — Encounter: Payer: Self-pay | Admitting: Family Medicine

## 2018-03-15 NOTE — Progress Notes (Signed)
Chief Complaint  Patient presents with  . Sore Throat    pt here today c/o body aches, sore throat, cough and has been exposed to the flu and strep throat    HPI  Patient presents today for Patient presents with upper respiratory congestion. Rhinorrhea that is frequently purulent. There is moderate sore throat. Patient reports coughing frequently as well.  Green sputum noted. There is no fever, chills or sweats. The patient denies being short of breath. Onset was 3-5 days ago. Gradually worsening. Tried OTCs without improvement.  PMH: Smoking status noted ROS: Per HPI  Objective: BP 109/67   Pulse 93   Temp 97.9 F (36.6 C) (Oral)   Ht  (1.626 m)   Wt 170 lb (77.1 kg)   BMI 29.18 kg/m  Gen: NAD, alert, cooperative with exam HEENT: NCAT, Nasal passages swollen, red TMS RED CV: RRR, good S1/S2, no murmur Resp: Bronchitis changes with scattered wheezes, non-labored Ext: No edema, warm Neuro: Alert and oriented, No gross deficits  Assessment and plan:  1. Cough     Meds ordered this encounter  Medications  . betamethasone acetate-betamethasone sodium phosphate (CELESTONE) injection 6 mg  . amoxicillin-clavulanate (AUGMENTIN) 875-125 MG tablet    Sig: Take 1 tablet by mouth 2 (two) times daily.    Dispense:  20 tablet    Refill:  0  . fluconazole (DIFLUCAN) 150 MG tablet    Sig: Take 1 tablet (150 mg total) by mouth daily.    Dispense:  3 tablet    Refill:  1    No orders of the defined types were placed in this encounter.   Follow up as needed.  Mechele Claude, MD

## 2018-03-17 ENCOUNTER — Ambulatory Visit: Payer: Medicare Other | Admitting: Family Medicine

## 2018-03-25 ENCOUNTER — Other Ambulatory Visit: Payer: Self-pay | Admitting: Family Medicine

## 2018-03-25 DIAGNOSIS — M549 Dorsalgia, unspecified: Secondary | ICD-10-CM

## 2018-03-28 ENCOUNTER — Encounter: Payer: Self-pay | Admitting: Family Medicine

## 2018-03-28 ENCOUNTER — Ambulatory Visit (INDEPENDENT_AMBULATORY_CARE_PROVIDER_SITE_OTHER): Payer: Medicare Other | Admitting: Family Medicine

## 2018-03-28 VITALS — BP 125/83 | HR 79 | Temp 98.9°F | Ht 64.0 in | Wt 172.0 lb

## 2018-03-28 DIAGNOSIS — J0100 Acute maxillary sinusitis, unspecified: Secondary | ICD-10-CM | POA: Diagnosis not present

## 2018-03-28 MED ORDER — FLUTICASONE PROPIONATE 50 MCG/ACT NA SUSP: 2.0000 | Freq: Every day | NASAL | 1 refills | Status: DC

## 2018-03-28 MED ORDER — CEFDINIR 300 MG PO CAPS: 300.0000 mg | ORAL_CAPSULE | Freq: Two times a day (BID) | ORAL | 0 refills | Status: DC

## 2018-03-28 MED ORDER — CYCLOBENZAPRINE HCL 10 MG PO TABS: 10.0000 mg | ORAL_TABLET | Freq: Three times a day (TID) | ORAL | 1 refills | Status: DC | PRN

## 2018-03-28 MED ORDER — PREDNISONE 20 MG PO TABS: ORAL_TABLET | ORAL | 0 refills | Status: DC

## 2018-03-28 NOTE — Progress Notes (Signed)
BP 125/83   Pulse 79   Temp 98.9 F (37.2 C) (Oral)   Ht  (1.626 m)   Wt 172 lb (78 kg)   BMI 29.52 kg/m    Subjective:    Patient ID: Erin Donaldson, female    DOB: 1951-02-10, 67 y.o.   MRN: [id number]  HPI: Erin Donaldson is a 67 y.o. female presenting on 03/28/2018 for Sinus headache, facial pain, hurting in eyes, sinus congesti   HPI Sinus congestion Patient comes in complaining of facial pain and sinus congestion and headache that has been going on for the past month.  She says she was treated 1 month ago with Augmentin and a lot of her bronchitis resolved but not her sinus pressure.  She denies any shortness of breath or wheezing or chest congestion today but says mostly its sinus pressure and congestion and headache that is associated with it.  Especially below both eyes.  She denies any sick contacts that she knows of.  She has been using an allergy medication and BC sinus powders.  She said they helped some but not completely.  Relevant past medical, surgical, family and social history reviewed and updated as indicated. Interim medical history since our last visit reviewed. Allergies and medications reviewed and updated.  Review of Systems  Constitutional: Negative for chills and fever.  HENT: Positive for congestion, postnasal drip, rhinorrhea, sinus pressure, sneezing and sore throat. Negative for ear discharge and ear pain.   Eyes: Negative for pain, redness and visual disturbance.  Respiratory: Positive for cough. Negative for chest tightness, shortness of breath and wheezing.   Cardiovascular: Negative for chest pain and leg swelling.  Genitourinary: Negative for difficulty urinating and dysuria.  Musculoskeletal: Negative for back pain and gait problem.  Skin: Negative for rash.  Neurological: Negative for light-headedness and headaches.  Psychiatric/Behavioral: Negative for agitation and behavioral problems.  All other systems reviewed and are  negative.   Per HPI unless specifically indicated above   Allergies as of 03/28/2018      Reactions   Darvocet [propoxyphene N-acetaminophen]    Decongestant [oxymetazoline]    Elavil [amitriptyline]    Risperidone And Related    Ultram [tramadol]       Medication List        Accurate as of 03/28/18  4:37 PM. Always use your most recent med list.          cyclobenzaprine 10 MG tablet Commonly known as:  FLEXERIL TAKE 1/2 TO 1 TAB TWICE A DAY AS DIRECTED   donepezil 10 MG tablet Commonly known as:  ARICEPT TAKE 1 TABLET (10 MG TOTAL) BY MOUTH DAILY.   donepezil 10 MG tablet Commonly known as:  ARICEPT TAKE 1 TABLET BY MOUTH EVERY DAY   fluconazole 150 MG tablet Commonly known as:  DIFLUCAN Take 1 tablet (150 mg total) by mouth daily.   fluticasone 50 MCG/ACT nasal spray Commonly known as:  FLONASE PLACE 2 SPRAYS INTO BOTH NOSTRILS DAILY.   furosemide 20 MG tablet Commonly known as:  LASIX TAKE 1 TABLET BY MOUTH EVERY DAY   gabapentin 300 MG capsule Commonly known as:  NEURONTIN TAKE 3 CAPSULE BY MOUTH THREE TIMES A DAY   lamoTRIgine 200 MG tablet Commonly known as:  LAMICTAL Take 200 mg by mouth daily.   meclizine 12.5 MG tablet Commonly known as:  ANTIVERT Take 1 tablet (12.5 mg total) by mouth 3 (three) times daily as needed for dizziness.   methocarbamol 750  MG tablet Commonly known as:  ROBAXIN TAKE 1 TABLET (750 MG TOTAL) BY MOUTH 4 (FOUR) TIMES DAILY.   nystatin 100000 UNIT/ML suspension Commonly known as:  MYCOSTATIN Take 5 mLs (500,000 Units total) by mouth 4 (four) times daily.   nystatin cream Commonly known as:  MYCOSTATIN APPLY 1 APPLICATION TOPICALLY 2 (TWO) TIMES DAILY.   pantoprazole 40 MG tablet Commonly known as:  PROTONIX Take 2 tablets (80 mg total) by mouth daily.   QUEtiapine 400 MG tablet Commonly known as:  SEROQUEL Take 800 mg by mouth at bedtime.   QUEtiapine 200 MG tablet Commonly known as:  SEROQUEL Take 200 mg  by mouth daily.   rizatriptan 10 MG tablet Commonly known as:  MAXALT Take 1 tablet (10 mg total) by mouth as needed. May repeat in 2 hours if needed   Vitamin D (Ergocalciferol) 50000 units Caps capsule Commonly known as:  DRISDOL Take 1 capsule (50,000 Units total) by mouth every 7 (seven) days.          Objective:    BP 125/83   Pulse 79   Temp 98.9 F (37.2 C) (Oral)   Ht  (1.626 m)   Wt 172 lb (78 kg)   BMI 29.52 kg/m   Wt Readings from Last 3 Encounters:  03/28/18 172 lb (78 kg)  03/09/18 170 lb (77.1 kg)  12/29/17 165 lb (74.8 kg)    Physical Exam  Constitutional: She is oriented to person, place, and time. She appears well-developed and well-nourished. No distress.  HENT:  Right Ear: Tympanic membrane, external ear and ear canal normal.  Left Ear: Tympanic membrane, external ear and ear canal normal.  Nose: Mucosal edema and rhinorrhea present. No epistaxis. Right sinus exhibits maxillary sinus tenderness. Right sinus exhibits no frontal sinus tenderness. Left sinus exhibits maxillary sinus tenderness. Left sinus exhibits no frontal sinus tenderness.  Mouth/Throat: Uvula is midline and mucous membranes are normal. Posterior oropharyngeal edema and posterior oropharyngeal erythema present. No oropharyngeal exudate or tonsillar abscesses.  Eyes: Conjunctivae are normal.  Neck: Neck supple. No thyromegaly present.  Cardiovascular: Normal rate, regular rhythm, normal heart sounds and intact distal pulses.  No murmur heard. Pulmonary/Chest: Effort normal and breath sounds normal. No respiratory distress. She has no wheezes.  Musculoskeletal: Normal range of motion. She exhibits no edema or tenderness.  Lymphadenopathy:    She has no cervical adenopathy.  Neurological: She is alert and oriented to person, place, and time. Coordination normal.  Skin: Skin is warm and dry. No rash noted. She is not diaphoretic.  Psychiatric: She has a normal mood and affect. Her  behavior is normal.  Vitals reviewed.       Assessment & Plan:   Problem List Items Addressed This Visit    None    Visit Diagnoses    Acute non-recurrent maxillary sinusitis    -  Primary   Relevant Medications   fluticasone (FLONASE) 50 MCG/ACT nasal spray   predniSONE (DELTASONE) 20 MG tablet   cefdinir (OMNICEF) 300 MG capsule       Follow up plan: Return if symptoms worsen or fail to improve.  Counseling provided for all of the vaccine components No orders of the defined types were placed in this encounter.   Arville Care, MD [facility] 03/28/2018, 4:37 PM

## 2018-03-29 ENCOUNTER — Telehealth: Payer: Self-pay | Admitting: Family Medicine

## 2018-03-29 MED ORDER — AMOXICILLIN-POT CLAVULANATE 875-125 MG PO TABS: 1.0000 | ORAL_TABLET | Freq: Two times a day (BID) | ORAL | 0 refills | Status: DC

## 2018-03-29 NOTE — Telephone Encounter (Signed)
Patient aware.

## 2018-03-29 NOTE — Telephone Encounter (Signed)
Augmentin Prescription sent to pharmacy   

## 2018-04-04 ENCOUNTER — Encounter: Payer: Self-pay | Admitting: Family Medicine

## 2018-04-04 ENCOUNTER — Telehealth: Payer: Self-pay | Admitting: Family Medicine

## 2018-04-04 ENCOUNTER — Ambulatory Visit (INDEPENDENT_AMBULATORY_CARE_PROVIDER_SITE_OTHER): Payer: Medicare Other | Admitting: Family Medicine

## 2018-04-04 VITALS — BP 117/76 | HR 106 | Temp 98.6°F | Ht 64.0 in | Wt 173.0 lb

## 2018-04-04 DIAGNOSIS — D692 Other nonthrombocytopenic purpura: Secondary | ICD-10-CM | POA: Diagnosis not present

## 2018-04-04 DIAGNOSIS — K219 Gastro-esophageal reflux disease without esophagitis: Secondary | ICD-10-CM | POA: Diagnosis not present

## 2018-04-04 DIAGNOSIS — D509 Iron deficiency anemia, unspecified: Secondary | ICD-10-CM

## 2018-04-04 DIAGNOSIS — G43909 Migraine, unspecified, not intractable, without status migrainosus: Secondary | ICD-10-CM

## 2018-04-04 DIAGNOSIS — F3170 Bipolar disorder, currently in remission, most recent episode unspecified: Secondary | ICD-10-CM | POA: Diagnosis not present

## 2018-04-04 DIAGNOSIS — J0100 Acute maxillary sinusitis, unspecified: Secondary | ICD-10-CM

## 2018-04-04 DIAGNOSIS — E559 Vitamin D deficiency, unspecified: Secondary | ICD-10-CM

## 2018-04-04 DIAGNOSIS — I10 Essential (primary) hypertension: Secondary | ICD-10-CM

## 2018-04-04 DIAGNOSIS — E785 Hyperlipidemia, unspecified: Secondary | ICD-10-CM | POA: Diagnosis not present

## 2018-04-04 DIAGNOSIS — E1169 Type 2 diabetes mellitus with other specified complication: Secondary | ICD-10-CM

## 2018-04-04 LAB — BAYER DCA HB A1C WAIVED: HB A1C (BAYER DCA - WAIVED): 7.1 % — ABNORMAL HIGH (ref <7.0)

## 2018-04-04 MED ORDER — SULFAMETHOXAZOLE-TRIMETHOPRIM 800-160 MG PO TABS: 1.0000 | ORAL_TABLET | Freq: Two times a day (BID) | ORAL | 0 refills | Status: DC

## 2018-04-04 MED ORDER — RIZATRIPTAN BENZOATE 10 MG PO TABS: 10.0000 mg | ORAL_TABLET | ORAL | 5 refills | Status: DC | PRN

## 2018-04-04 MED ORDER — SULFAMETHOXAZOLE-TRIMETHOPRIM 800-160 MG PO TABS: 1.0000 | ORAL_TABLET | Freq: Two times a day (BID) | ORAL | 1 refills | Status: DC

## 2018-04-04 NOTE — Telephone Encounter (Signed)
Correct order sent to pharm

## 2018-04-04 NOTE — Patient Instructions (Addendum)
Medicare Annual Wellness Visit  [place] and the medical providers at [facility] strive to bring you the best medical care.  In doing so we not only want to address your current medical conditions and concerns but also to detect new conditions early and prevent illness, disease and health-related problems.    Medicare offers a yearly Wellness Visit which allows our clinical staff to assess your need for preventative services including immunizations, lifestyle education, counseling to decrease risk of preventable diseases and screening for fall risk and other medical concerns.    This visit is provided free of charge (no copay) for all Medicare recipients. The clinical pharmacists at [facility] have begun to conduct these Wellness Visits which will also include a thorough review of all your medications.    As you primary medical provider recommend that you make an appointment for your Annual Wellness Visit if you have not done so already this year.  You may set up this appointment before you leave today or you may call back ([phone]) and schedule an appointment.  Please make sure when you call that you mention that you are scheduling your Annual Wellness Visit with the clinical pharmacist so that the appointment may be made for the proper length of time.     Continue current medications. Continue good therapeutic lifestyle changes which include good diet and exercise. Fall precautions discussed with patient. If an FOBT was given today- please return it to our front desk. If you are over 3 years old - you may need Prevnar 13 or the adult Pneumonia vaccine.  **Flu shots are available--- please call and schedule a FLU-CLINIC appointment**  After your visit with [place] today you will receive a survey in the mail or online from [employer] regarding your care with [place]. Please take a moment to fill this out. Your feedback is very  important to [place] as you can help [place] better understand your patient needs as well as improve your experience and satisfaction. WE CARE ABOUT YOU!!!   Continue with Flonase Take antibiotic as directed with food for 10 days, if better but not completely improved take for a total of 14 days Use nasal saline frequently during the day We will try to avoid the use of nasal steroids since you have already had a course of this recently. Drink plenty of fluids and make sure that you take the sulfa with food so that it is less likely to irritate her stomach

## 2018-04-04 NOTE — Progress Notes (Signed)
Subjective:    Patient ID: Erin Donaldson, female    DOB: 1951/06/29, 67 y.o.   MRN: [id number]  HPI Pt here for follow up and management of chronic medical problems which includes diabetes, hypertension and hyperlipidemia. She is taking medication regularly.  The patient comes in today for her regular checkup and her vital signs are stable and she is due to get a pelvic exam Pap smear and mammogram.  She is also due for an FOBT and lab work.  She is wanting refills on her Maxalt and she complains today of a sinus infection.  Patient recently had a bout with bronchitis and everything seemed to clear up but her sinuses.  She was subsequently started on Omnicef 300 mg twice daily.She had maxillary and frontal sinus tenderness.  Patient never took the [place] because of the cost of the medicine with her insurance.  She only has had 2 rounds of Augmentin.  She still has facial pain and drainage.  She denies any chest pain or pressure.  She does have some shortness of breath associated with the drainage and sinus congestion.  She denies any nausea vomiting diarrhea blood in the stool or black tarry bowel movements.  She is past due the colonoscopy but she is reluctant to do this because the last time she was put to sleep she was not fully sedated and it was traumatic to her to do either an endoscopy or colonoscopy.  She would consider doing the Cologuard so we will look into that for her.  She denies any trouble with passing her water.    Patient Active Problem List   Diagnosis Date Noted  . Abnormal EKG 12/29/2017  . Iron deficiency anemia 01/24/2016  . Swelling of right lower extremity 07/15/2015  . Right hip pain 07/15/2015  . Microcytic anemia 07/15/2015  . Elevated LFTs   . History of gastric bypass 01/15/2014  . Amnestic disorder due to medical condition 08/23/2013  . Diarrhea 07/19/2013  . BIPOLAR AFFECTIVE DISORDER 08/26/2009  . ANXIETY 08/26/2009  . MIGRAINE HEADACHE 08/26/2009  . Type  2 diabetes mellitus with hyperlipidemia ([place]) 08/15/2009  . Hyperlipidemia 08/15/2009  . Essential hypertension 08/15/2009  . GERD 08/15/2009  . CONSTIPATION 08/15/2009  . IRRITABLE BOWEL SYNDROME 08/15/2009   Outpatient Encounter Medications as of 04/04/2018  Medication Sig  . amoxicillin-clavulanate (AUGMENTIN) 875-125 MG tablet Take 1 tablet by mouth 2 (two) times daily.  . cyclobenzaprine (FLEXERIL) 10 MG tablet Take 1 tablet (10 mg total) by mouth 3 (three) times daily as needed for muscle spasms.  Marland Kitchen donepezil (ARICEPT) 10 MG tablet TAKE 1 TABLET (10 MG TOTAL) BY MOUTH DAILY.  . fluticasone (FLONASE) 50 MCG/ACT nasal spray Place 2 sprays into both nostrils daily.  . furosemide (LASIX) 20 MG tablet TAKE 1 TABLET BY MOUTH EVERY DAY  . gabapentin (NEURONTIN) 300 MG capsule TAKE 3 CAPSULE BY MOUTH THREE TIMES A DAY  . lamoTRIgine (LAMICTAL) 200 MG tablet Take 200 mg by mouth daily.  . meclizine (ANTIVERT) 12.5 MG tablet Take 1 tablet (12.5 mg total) by mouth 3 (three) times daily as needed for dizziness.  . methocarbamol (ROBAXIN) 750 MG tablet TAKE 1 TABLET (750 MG TOTAL) BY MOUTH 4 (FOUR) TIMES DAILY.  Marland Kitchen nystatin (MYCOSTATIN) 100000 UNIT/ML suspension Take 5 mLs (500,000 Units total) by mouth 4 (four) times daily.  Marland Kitchen nystatin cream (MYCOSTATIN) APPLY 1 APPLICATION TOPICALLY 2 (TWO) TIMES DAILY.  . pantoprazole (PROTONIX) 40 MG tablet Take 2 tablets (80 mg  total) by mouth daily.  . QUEtiapine (SEROQUEL) 200 MG tablet Take 200 mg by mouth daily.  . QUEtiapine (SEROQUEL) 400 MG tablet Take 800 mg by mouth at bedtime.   . rizatriptan (MAXALT) 10 MG tablet Take 1 tablet (10 mg total) by mouth as needed. May repeat in 2 hours if needed  . Vitamin D, Ergocalciferol, (DRISDOL) 50000 units CAPS capsule Take 1 capsule (50,000 Units total) by mouth every 7 (seven) days.  . [DISCONTINUED] cefdinir (OMNICEF) 300 MG capsule Take 1 capsule (300 mg total) by mouth 2 (two) times daily. 1 po BID  .  [DISCONTINUED] donepezil (ARICEPT) 10 MG tablet TAKE 1 TABLET BY MOUTH EVERY DAY  . [DISCONTINUED] fluconazole (DIFLUCAN) 150 MG tablet Take 1 tablet (150 mg total) by mouth daily.  . [DISCONTINUED] predniSONE (DELTASONE) 20 MG tablet 2 po at same time daily for 5 days   No facility-administered encounter medications on file as of 04/04/2018.      Review of Systems  Constitutional: Negative.   HENT: Positive for sinus pressure and sinus pain.   Eyes: Negative.   Respiratory: Negative.   Cardiovascular: Negative.   Gastrointestinal: Negative.   Endocrine: Negative.   Genitourinary: Negative.   Musculoskeletal: Negative.   Skin: Negative.   Allergic/Immunologic: Negative.   Neurological: Negative.   Hematological: Negative.   Psychiatric/Behavioral: Negative.        Objective:   Physical Exam  Constitutional: She is oriented to person, place, and time. She appears well-developed and well-nourished. No distress.  Patient continues to have sinus problems and did not take the [facility] as directed because of the expense.  HENT:  Head: Normocephalic and atraumatic.  Right Ear: External ear normal.  Left Ear: External ear normal.  Mouth/Throat: Oropharynx is clear and moist.  Maxillary frontal and ethmoid sinuses all tender to palpation.  Distant nasal turbinate congestion  Eyes: Pupils are equal, round, and reactive to light. Conjunctivae and EOM are normal. Right eye exhibits no discharge. Left eye exhibits no discharge.  Neck: Normal range of motion. Neck supple. No thyromegaly present.  No anterior cervical adenopathy  Cardiovascular: Normal rate, regular rhythm, normal heart sounds and intact distal pulses.  No murmur heard. Pulmonary/Chest: Effort normal and breath sounds normal. She has no wheezes. She has no rales.  Clear anteriorly and posteriorly  Abdominal: Soft. Bowel sounds are normal. She exhibits no mass. There is no tenderness.  No liver or spleen enlargement.  No  epigastric tenderness.  No suprapubic tenderness.  Musculoskeletal: Normal range of motion. She exhibits no edema.  Lymphadenopathy:    She has no cervical adenopathy.  Neurological: She is alert and oriented to person, place, and time. She has normal reflexes. No cranial nerve deficit.  Skin: Skin is warm and dry. No rash noted. No erythema.  Psychiatric: She has a normal mood and affect. Her behavior is normal. Judgment and thought content normal.  Patient's mood and affect and behavior are normal for her.  She does feel bad because of the persistent congestion and headache.  Nursing note and vitals reviewed.  BP 117/76 (BP Location: Left Arm)   Pulse (!) 106   Temp 98.6 F (37 C) (Oral)   Ht '5\' 4"'  (1.626 m)   Wt 173 lb (78.5 kg)   BMI 29.70 kg/m         Assessment & Plan:  1. Type 2 diabetes mellitus with hyperlipidemia ([place]) -This patient no longer has a diagnosis of diabetes mellitus.  Her last A1c's  have been good and this is mostly attributed to the fact that she has had gastric bypass surgery.  We will do one further hemoglobin A1c and then we will remove this diagnosis from her problem list - CBC with Differential/Platelet - Bayer DCA Hb A1c Waived  2. Hyperlipidemia, unspecified hyperlipidemia type -Continue with aggressive therapeutic lifestyle changes - CBC with Differential/Platelet - Lipid panel  3. Essential hypertension -Blood pressure is good and she will continue with her current treatment of furosemide. - CBC with Differential/Platelet - BMP8+EGFR - Hepatic function panel  4. Gastroesophageal reflux disease, esophagitis presence not specified -Continue with Protonix and diet of avoidance - CBC with Differential/Platelet  5. Iron deficiency anemia, unspecified iron deficiency anemia type - CBC with Differential/Platelet  6. Vitamin D deficiency -Continue with current treatment pending results of lab work - CBC with Differential/Platelet - VITAMIN D  25 Hydroxy (Vit-D Deficiency, Fractures)  7. Migraine without status migrainosus, not intractable, unspecified migraine type - rizatriptan (MAXALT) 10 MG tablet; Take 1 tablet (10 mg total) by mouth as needed. May repeat in 2 hours if needed  Dispense: 10 tablet; Refill: 5  8. Acute non-recurrent maxillary sinusitis -Septra DS 1 twice daily for 10 days with food -Frequent use of nasal saline and continue with Flonase  9. Bipolar affective disorder in remission [place]) -Follow-up with psychiatry as planned  10. Senile purpura ([facility]) -Areas of bruising on both forearms but no other bleeding issues per patient history  Meds ordered this encounter  Medications  . sulfamethoxazole-trimethoprim (BACTRIM DS) 800-160 MG tablet    Sig: Take 1 tablet by mouth 2 (two) times daily.    Dispense:  14 tablet    Refill:  1  . rizatriptan (MAXALT) 10 MG tablet    Sig: Take 1 tablet (10 mg total) by mouth as needed. May repeat in 2 hours if needed    Dispense:  10 tablet    Refill:  5   Patient Instructions                       Medicare Annual Wellness Visit  [place] and the medical providers at [facility] strive to bring you the best medical care.  In doing so we not only want to address your current medical conditions and concerns but also to detect new conditions early and prevent illness, disease and health-related problems.    Medicare offers a yearly Wellness Visit which allows our clinical staff to assess your need for preventative services including immunizations, lifestyle education, counseling to decrease risk of preventable diseases and screening for fall risk and other medical concerns.    This visit is provided free of charge (no copay) for all Medicare recipients. The clinical pharmacists at [facility] have begun to conduct these Wellness Visits which will also include a thorough review of all your medications.    As you primary medical  provider recommend that you make an appointment for your Annual Wellness Visit if you have not done so already this year.  You may set up this appointment before you leave today or you may call back ([phone]) and schedule an appointment.  Please make sure when you call that you mention that you are scheduling your Annual Wellness Visit with the clinical pharmacist so that the appointment may be made for the proper length of time.     Continue current medications. Continue good therapeutic lifestyle changes which include good diet and exercise.  Fall precautions discussed with patient. If an FOBT was given today- please return it to our front desk. If you are over 17 years old - you may need Prevnar 20 or the adult Pneumonia vaccine.  **Flu shots are available--- please call and schedule a FLU-CLINIC appointment**  After your visit with [place] today you will receive a survey in the mail or online from [employer] regarding your care with [place]. Please take a moment to fill this out. Your feedback is very important to [place] as you can help [place] better understand your patient needs as well as improve your experience and satisfaction. WE CARE ABOUT YOU!!!   Continue with Flonase Take antibiotic as directed with food for 10 days, if better but not completely improved take for a total of 14 days Use nasal saline frequently during the day We will try to avoid the use of nasal steroids since you have already had a course of this recently. Drink plenty of fluids and make sure that you take the sulfa with food so that it is less likely to irritate her stomach  Arrie Senate MD

## 2018-04-05 LAB — LIPID PANEL
Chol/HDL Ratio: 2.7 ratio (ref 0.0–4.4)
Cholesterol, Total: 182 mg/dL (ref 100–199)
HDL: 68 mg/dL (ref >39)
LDL Calculated: 69 mg/dL (ref 0–99)
Triglycerides: 224 mg/dL — ABNORMAL HIGH (ref 0–149)
VLDL Cholesterol Cal: 45 mg/dL — ABNORMAL HIGH (ref 5–40)

## 2018-04-05 LAB — VITAMIN D 25 HYDROXY (VIT D DEFICIENCY, FRACTURES): Vit D, 25-Hydroxy: 11.7 ng/mL — ABNORMAL LOW (ref 30.0–100.0)

## 2018-04-05 LAB — CBC WITH DIFFERENTIAL/PLATELET
Basophils Absolute: 0 10*3/uL (ref 0.0–0.2)
Basos: 0 %
EOS (ABSOLUTE): 0.3 10*3/uL (ref 0.0–0.4)
Eos: 3 %
Hematocrit: 36.6 % (ref 34.0–46.6)
Hemoglobin: 11.9 g/dL (ref 11.1–15.9)
Immature Grans (Abs): 0.1 10*3/uL (ref 0.0–0.1)
Immature Granulocytes: 1 %
Lymphocytes Absolute: 1.7 10*3/uL (ref 0.7–3.1)
Lymphs: 15 %
MCH: 26.2 pg — ABNORMAL LOW (ref 26.6–33.0)
MCHC: 32.5 g/dL (ref 31.5–35.7)
MCV: 80 fL (ref 79–97)
Monocytes Absolute: 1 10*3/uL — ABNORMAL HIGH (ref 0.1–0.9)
Monocytes: 9 %
Neutrophils Absolute: 8.7 10*3/uL — ABNORMAL HIGH (ref 1.4–7.0)
Neutrophils: 72 %
Platelets: 274 10*3/uL (ref 150–450)
RBC: 4.55 x10E6/uL (ref 3.77–5.28)
RDW: 15.8 % — ABNORMAL HIGH (ref 12.3–15.4)
WBC: 11.8 10*3/uL — ABNORMAL HIGH (ref 3.4–10.8)

## 2018-04-05 LAB — HEPATIC FUNCTION PANEL
ALT: 16 IU/L (ref 0–32)
AST: 13 IU/L (ref 0–40)
Albumin: 4.2 g/dL (ref 3.6–4.8)
Alkaline Phosphatase: 208 IU/L — ABNORMAL HIGH (ref 39–117)
Bilirubin Total: 0.3 mg/dL (ref 0.0–1.2)
Bilirubin, Direct: 0.05 mg/dL (ref 0.00–0.40)
Total Protein: 6.3 g/dL (ref 6.0–8.5)

## 2018-04-05 LAB — BMP8+EGFR
BUN/Creatinine Ratio: 14 (ref 12–28)
BUN: 14 mg/dL (ref 8–27)
CO2: 22 mmol/L (ref 20–29)
Calcium: 9 mg/dL (ref 8.7–10.3)
Chloride: 102 mmol/L (ref 96–106)
Creatinine, Ser: 1.03 mg/dL — ABNORMAL HIGH (ref 0.57–1.00)
GFR calc Af Amer: 66 mL/min/{1.73_m2} (ref >59)
GFR calc non Af Amer: 58 mL/min/{1.73_m2} — ABNORMAL LOW (ref >59)
Glucose: 101 mg/dL — ABNORMAL HIGH (ref 65–99)
Potassium: 5.3 mmol/L — ABNORMAL HIGH (ref 3.5–5.2)
Sodium: 141 mmol/L (ref 134–144)

## 2018-04-06 ENCOUNTER — Telehealth: Payer: Self-pay | Admitting: Family Medicine

## 2018-04-07 NOTE — Telephone Encounter (Signed)
Attempted to contact patient - NA °

## 2018-04-08 ENCOUNTER — Telehealth: Payer: Self-pay | Admitting: Family Medicine

## 2018-04-08 MED ORDER — DOXYCYCLINE HYCLATE 100 MG PO TABS: 100.0000 mg | ORAL_TABLET | Freq: Two times a day (BID) | ORAL | 0 refills | Status: DC

## 2018-04-08 NOTE — Telephone Encounter (Signed)
Doxycycline rx sent to pharmacy.  

## 2018-04-08 NOTE — Telephone Encounter (Signed)
Patient aware doxycycline sent to pharmacy

## 2018-04-14 ENCOUNTER — Other Ambulatory Visit: Payer: Self-pay | Admitting: Family Medicine

## 2018-04-26 ENCOUNTER — Encounter: Payer: Self-pay | Admitting: Family

## 2018-04-26 ENCOUNTER — Ambulatory Visit (INDEPENDENT_AMBULATORY_CARE_PROVIDER_SITE_OTHER): Payer: Medicare Other | Admitting: Family

## 2018-04-26 VITALS — BP 131/79 | HR 92 | Temp 98.5°F | Ht 64.0 in | Wt 172.0 lb

## 2018-04-26 DIAGNOSIS — N898 Other specified noninflammatory disorders of vagina: Secondary | ICD-10-CM

## 2018-04-26 DIAGNOSIS — Z01419 Encounter for gynecological examination (general) (routine) without abnormal findings: Secondary | ICD-10-CM

## 2018-04-26 DIAGNOSIS — E663 Overweight: Secondary | ICD-10-CM

## 2018-04-26 LAB — WET PREP FOR TRICH, YEAST, CLUE
Clue Cell Exam: POSITIVE — AB
Trichomonas Exam: NEGATIVE
Yeast Exam: NEGATIVE

## 2018-04-26 MED ORDER — VITAMIN D (ERGOCALCIFEROL) 1.25 MG (50000 UNIT) PO CAPS: 50000.0000 [IU] | ORAL_CAPSULE | ORAL | 3 refills | Status: DC

## 2018-04-26 NOTE — Progress Notes (Signed)
   Subjective:    Patient ID: Erin Donaldson, female    DOB: Feb 11, 1951, 67 y.o.   MRN: [id number]  Chief Complaint  Patient presents with  . Gynecologic Exam    pap   Pt presents to the office today for Pap. PT is followed by Dr. Laurance Flatten for chronic follow up every 4 months. She was seen on 04/04/18 and had lab work. We have discussed these results with her.  Gynecologic Exam  The patient's primary symptoms include vaginal discharge. The patient's pertinent negatives include no genital itching, genital lesions or genital odor. This is a chronic problem. The current episode started more than 1 year ago. The problem occurs intermittently. The pain is mild. Pertinent negatives include no back pain, constipation, diarrhea, nausea or painful intercourse. The vaginal discharge was yellow.      Review of Systems  Gastrointestinal: Negative for constipation, diarrhea and nausea.  Genitourinary: Positive for vaginal discharge.  Musculoskeletal: Negative for back pain.  All other systems reviewed and are negative.      Objective:   Physical Exam  Constitutional: She is oriented to person, place, and time. She appears well-developed and well-nourished. No distress.  HENT:  Head: Normocephalic and atraumatic.  Right Ear: External ear normal.  Left Ear: External ear normal.  Mouth/Throat: Oropharynx is clear and moist.  Eyes: Pupils are equal, round, and reactive to light.  Neck: Normal range of motion. Neck supple. No thyromegaly present.  Cardiovascular: Normal rate, regular rhythm, normal heart sounds and intact distal pulses.  No murmur heard. Pulmonary/Chest: Effort normal and breath sounds normal. No respiratory distress. She has no wheezes. Right breast exhibits no inverted nipple, no mass, no nipple discharge, no skin change and no tenderness. Left breast exhibits no inverted nipple, no mass, no nipple discharge, no skin change and no tenderness.  Abdominal: Soft. Bowel sounds are  normal. She exhibits no distension. There is no tenderness.  Genitourinary: Vagina normal.  Genitourinary Comments: Bimanual exam- no adnexal masses or tenderness, ovaries nonpalpable   Cervix parous and pink- No discharge   Musculoskeletal: Normal range of motion. She exhibits no edema or tenderness.  Neurological: She is alert and oriented to person, place, and time. She has normal reflexes. No cranial nerve deficit.  Skin: Skin is warm and dry.  Psychiatric: She has a normal mood and affect. Her behavior is normal. Judgment and thought content normal.  Vitals reviewed.     BP 131/79   Pulse 92   Temp 98.5 F (36.9 C) (Oral)   Ht _0  (1.626 m)   Wt 172 lb (78 kg)   BMI 29.52 kg/m      Assessment & Plan:  1. Gynecologic exam normal - IGP, Aptima HPV, rfx 16/18,45 - CBC with Differential/Platelet - BMP8+EGFR  2. Vaginal discharge - WET PREP FOR TRICH, YEAST, CLUE - CBC with Differential/Platelet - BMP8+EGFR  3. Overweight (BMI 25.0-29.9)   Keep all follow up with PCP Labs pending     Erin Dun, FNP

## 2018-04-26 NOTE — Patient Instructions (Signed)

## 2018-04-27 LAB — CBC WITH DIFFERENTIAL/PLATELET
Basophils Absolute: 0 10*3/uL (ref 0.0–0.2)
Basos: 0 %
EOS (ABSOLUTE): 0.2 10*3/uL (ref 0.0–0.4)
Eos: 2 %
Hematocrit: 35.2 % (ref 34.0–46.6)
Hemoglobin: 11.5 g/dL (ref 11.1–15.9)
Immature Grans (Abs): 0 10*3/uL (ref 0.0–0.1)
Immature Granulocytes: 0 %
Lymphocytes Absolute: 1.6 10*3/uL (ref 0.7–3.1)
Lymphs: 23 %
MCH: 26.6 pg (ref 26.6–33.0)
MCHC: 32.7 g/dL (ref 31.5–35.7)
MCV: 82 fL (ref 79–97)
Monocytes Absolute: 0.6 10*3/uL (ref 0.1–0.9)
Monocytes: 8 %
Neutrophils Absolute: 4.7 10*3/uL (ref 1.4–7.0)
Neutrophils: 67 %
Platelets: 259 10*3/uL (ref 150–450)
RBC: 4.32 x10E6/uL (ref 3.77–5.28)
RDW: 16.8 % — ABNORMAL HIGH (ref 12.3–15.4)
WBC: 7 10*3/uL (ref 3.4–10.8)

## 2018-04-27 LAB — BMP8+EGFR
BUN/Creatinine Ratio: 13 (ref 12–28)
BUN: 15 mg/dL (ref 8–27)
CO2: 23 mmol/L (ref 20–29)
Calcium: 8.7 mg/dL (ref 8.7–10.3)
Chloride: 101 mmol/L (ref 96–106)
Creatinine, Ser: 1.17 mg/dL — ABNORMAL HIGH (ref 0.57–1.00)
GFR calc Af Amer: 57 mL/min/{1.73_m2} — ABNORMAL LOW (ref >59)
GFR calc non Af Amer: 49 mL/min/{1.73_m2} — ABNORMAL LOW (ref >59)
Glucose: 111 mg/dL — ABNORMAL HIGH (ref 65–99)
Potassium: 4.4 mmol/L (ref 3.5–5.2)
Sodium: 140 mmol/L (ref 134–144)

## 2018-04-28 ENCOUNTER — Other Ambulatory Visit: Payer: Self-pay | Admitting: Family

## 2018-04-28 MED ORDER — METRONIDAZOLE 500 MG PO TABS: 500.0000 mg | ORAL_TABLET | Freq: Two times a day (BID) | ORAL | 0 refills | Status: DC

## 2018-04-29 ENCOUNTER — Ambulatory Visit: Payer: Medicare Other

## 2018-04-29 ENCOUNTER — Telehealth: Payer: Self-pay | Admitting: *Deleted

## 2018-04-29 NOTE — Telephone Encounter (Signed)
Aware of current lab results and new medication for infection.

## 2018-05-01 ENCOUNTER — Other Ambulatory Visit: Payer: Self-pay | Admitting: Family Medicine

## 2018-05-01 DIAGNOSIS — M549 Dorsalgia, unspecified: Secondary | ICD-10-CM

## 2018-05-03 LAB — IGP, APTIMA HPV, RFX 16/18,45
HPV Aptima: NEGATIVE
PAP Smear Comment: 0

## 2018-05-07 ENCOUNTER — Other Ambulatory Visit: Payer: Self-pay | Admitting: Family Medicine

## 2018-05-13 ENCOUNTER — Ambulatory Visit (INDEPENDENT_AMBULATORY_CARE_PROVIDER_SITE_OTHER): Payer: Medicare Other | Admitting: Family Medicine

## 2018-05-13 ENCOUNTER — Encounter: Payer: Self-pay | Admitting: Family Medicine

## 2018-05-13 VITALS — BP 126/82 | HR 98 | Temp 98.2°F | Ht 64.0 in | Wt 169.0 lb

## 2018-05-13 DIAGNOSIS — R531 Weakness: Secondary | ICD-10-CM

## 2018-05-13 DIAGNOSIS — R5383 Other fatigue: Secondary | ICD-10-CM

## 2018-05-13 DIAGNOSIS — R300 Dysuria: Secondary | ICD-10-CM

## 2018-05-13 DIAGNOSIS — M2550 Pain in unspecified joint: Secondary | ICD-10-CM | POA: Diagnosis not present

## 2018-05-13 LAB — WET PREP FOR TRICH, YEAST, CLUE
Clue Cell Exam: NEGATIVE
Trichomonas Exam: NEGATIVE
Yeast Exam: POSITIVE — AB

## 2018-05-13 NOTE — Progress Notes (Signed)
BP 126/82   Pulse 98   Temp 98.2 F (36.8 C) (Oral)   Ht 5\' 4"  (1.626 m)   Wt 169 lb (76.7 kg)   BMI 29.01 kg/m    Subjective:    Patient ID: Erin Donaldson, female    DOB: 1950/12/24, 67 y.o.   MRN: [id number]  HPI: Erin Donaldson is a 67 y.o. female presenting on 05/13/2018 for Ongoing headaches, joint pain, fatigue (headache for a couple of weeks, joint pain began about 4 days ago; had tick bite about 6 week ago)   HPI Headaches and joint pain and fatigue Patient comes in complaining of headaches and joint pain and fatigue and dysuria and vaginal discharge.  She has been having vaginal discharge that has been bothering her since the last time she was treated for it and she said it never fully got better.  She says she also complains of a tick bites about 6 weeks ago.  She says the fatigue is been going on for a month and joint aches have been over the past few days where she just feels achy all over her body.  She says she has chronic low back pain but it has been slightly worse than what it normally is over the past month.  She denies any fevers or chills or flank pain or any specific joint pain.  She does complain of headaches intermittently that is been going on for some time but worse over the past couple weeks.  She denies any rashes  Relevant past medical, surgical, family and social history reviewed and updated as indicated. Interim medical history since our last visit reviewed. Allergies and medications reviewed and updated.  Review of Systems  Constitutional: Negative for chills and fever.  HENT: Negative for congestion, ear discharge and ear pain.   Eyes: Negative for redness and visual disturbance.  Respiratory: Negative for chest tightness and shortness of breath.   Cardiovascular: Negative for chest pain and leg swelling.  Gastrointestinal: Positive for abdominal pain. Negative for constipation, diarrhea, nausea and vomiting.  Genitourinary: Positive for dysuria and  vaginal discharge. Negative for difficulty urinating, hematuria, vaginal bleeding and vaginal pain.  Musculoskeletal: Negative for back pain and gait problem.  Skin: Negative for color change and rash.  Neurological: Positive for headaches. Negative for dizziness, weakness, light-headedness and numbness.  Psychiatric/Behavioral: Negative for agitation and behavioral problems.  All other systems reviewed and are negative.   Per HPI unless specifically indicated above   Allergies as of 05/13/2018      Reactions   Bactrim [sulfamethoxazole-trimethoprim]    Raw and irritatated in mouth.   Darvocet [propoxyphene N-acetaminophen]    Decongestant [oxymetazoline]    Elavil [amitriptyline]    Risperidone And Related    Ultram [tramadol]       Medication List        Accurate as of 05/13/18  4:00 PM. Always use your most recent med list.          donepezil 10 MG tablet Commonly known as:  ARICEPT TAKE 1 TABLET (10 MG TOTAL) BY MOUTH DAILY.   fluticasone 50 MCG/ACT nasal spray Commonly known as:  FLONASE Place 2 sprays into both nostrils daily.   furosemide 20 MG tablet Commonly known as:  LASIX TAKE 1 TABLET BY MOUTH EVERY DAY   gabapentin 300 MG capsule Commonly known as:  NEURONTIN TAKE 3 CAPSULE BY MOUTH THREE TIMES A DAY   lamoTRIgine 200 MG tablet Commonly known as:  LAMICTAL Take  200 mg by mouth daily.   meclizine 12.5 MG tablet Commonly known as:  ANTIVERT Take 1 tablet (12.5 mg total) by mouth 3 (three) times daily as needed for dizziness.   methocarbamol 750 MG tablet Commonly known as:  ROBAXIN TAKE 1 TABLET (750 MG TOTAL) BY MOUTH 4 (FOUR) TIMES DAILY.   metroNIDAZOLE 500 MG tablet Commonly known as:  FLAGYL Take 1 tablet (500 mg total) by mouth 2 (two) times daily.   nystatin 100000 UNIT/ML suspension Commonly known as:  MYCOSTATIN TAKE 5 MLS (500,000 UNITS TOTAL) BY MOUTH 4 (FOUR) TIMES DAILY.   nystatin cream Commonly known as:  MYCOSTATIN APPLY 1  APPLICATION TOPICALLY 2 (TWO) TIMES DAILY.   pantoprazole 40 MG tablet Commonly known as:  PROTONIX Take 2 tablets (80 mg total) by mouth daily.   QUEtiapine 400 MG tablet Commonly known as:  SEROQUEL Take 800 mg by mouth at bedtime.   QUEtiapine 200 MG tablet Commonly known as:  SEROQUEL Take 200 mg by mouth daily.   rizatriptan 10 MG tablet Commonly known as:  MAXALT Take 1 tablet (10 mg total) by mouth as needed. May repeat in 2 hours if needed          Objective:    BP 126/82   Pulse 98   Temp 98.2 F (36.8 C) (Oral)   Ht 5\' 4"  (1.626 m)   Wt 169 lb (76.7 kg)   BMI 29.01 kg/m   Wt Readings from Last 3 Encounters:  05/13/18 169 lb (76.7 kg)  04/26/18 172 lb (78 kg)  04/04/18 173 lb (78.5 kg)    Physical Exam  Constitutional: She is oriented to person, place, and time. She appears well-developed and well-nourished. No distress.  Eyes: Conjunctivae are normal.  Neck: Neck supple. No thyromegaly present.  Cardiovascular: Normal rate, regular rhythm, normal heart sounds and intact distal pulses.  No murmur heard. Pulmonary/Chest: Effort normal and breath sounds normal. No respiratory distress. She has no wheezes.  Abdominal: Soft. Bowel sounds are normal. She exhibits no distension and no mass. There is tenderness (Diffuse tenderness). There is no rebound and no guarding.  Musculoskeletal: Normal range of motion. She exhibits no edema or tenderness.  Lymphadenopathy:    She has no cervical adenopathy.  Neurological: She is alert and oriented to person, place, and time. Coordination normal.  Skin: Skin is warm and dry. No rash noted. She is not diaphoretic.  Psychiatric: She has a normal mood and affect. Her behavior is normal.  Nursing note and vitals reviewed.  Wet prep: Few yeast Urinalysis: Patient was unable to leave urine and will bring it back.    Assessment & Plan:   Problem List Items Addressed This Visit    None    Visit Diagnoses    Other fatigue     -  Primary   Relevant Orders   Rocky mtn spotted fvr abs pnl(IgG+IgM)   Lyme Ab/Western Blot Reflex   Thyroid Panel With TSH   Weakness       Relevant Orders   Rocky mtn spotted fvr abs pnl(IgG+IgM)   Lyme Ab/Western Blot Reflex   Thyroid Panel With TSH   Arthralgia, unspecified joint       Relevant Orders   Rocky mtn spotted fvr abs pnl(IgG+IgM)   Lyme Ab/Western Blot Reflex   Thyroid Panel With TSH   Dysuria       Relevant Orders   WET PREP FOR TRICH, YEAST, CLUE   Urinalysis, Complete  Will treat patient for yeast and bacterial vaginosis because she is been going back and forth between the 2 and recommended probiotic. Will await lab results  Follow up plan: Return if symptoms worsen or fail to improve.  Counseling provided for all of the vaccine components Orders Placed This Encounter  Procedures  . WET PREP FOR TRICH, YEAST, CLUE  . Rocky mtn spotted fvr abs pnl(IgG+IgM)  . Lyme Ab/Western Blot Reflex  . Thyroid Panel With TSH  . Urinalysis, Complete    Arville Care, MD [facility] 05/13/2018, 4:00 PM

## 2018-05-16 ENCOUNTER — Telehealth: Payer: Self-pay | Admitting: Family Medicine

## 2018-05-16 MED ORDER — METRONIDAZOLE 500 MG PO TABS: 500.0000 mg | ORAL_TABLET | Freq: Two times a day (BID) | ORAL | 0 refills | Status: DC

## 2018-05-16 MED ORDER — FLUCONAZOLE 150 MG PO TABS: 150.0000 mg | ORAL_TABLET | Freq: Once | ORAL | 0 refills | Status: AC

## 2018-05-16 NOTE — Telephone Encounter (Signed)
Patient aware.

## 2018-05-16 NOTE — Telephone Encounter (Signed)
Please review and advise.

## 2018-05-16 NOTE — Telephone Encounter (Signed)
I do not know how these did not get sent but I sent them now for her please let her know.

## 2018-05-17 LAB — ROCKY MTN SPOTTED FVR ABS PNL(IGG+IGM)
RMSF IgG: NEGATIVE
RMSF IgM: 0.43 index (ref 0.00–0.89)

## 2018-05-17 LAB — THYROID PANEL WITH TSH
Free Thyroxine Index: 0.8 — ABNORMAL LOW (ref 1.2–4.9)
T3 Uptake Ratio: 26 % (ref 24–39)
T4, Total: 3.2 ug/dL — ABNORMAL LOW (ref 4.5–12.0)
TSH: 1.65 u[IU]/mL (ref 0.450–4.500)

## 2018-05-17 LAB — LYME AB/WESTERN BLOT REFLEX
LYME DISEASE AB, QUANT, IGM: <0.8 index (ref 0.00–0.79)
Lyme IgG/IgM Ab: <0.91 {ISR} (ref 0.00–0.90)

## 2018-05-19 ENCOUNTER — Other Ambulatory Visit: Payer: Self-pay | Admitting: Family Medicine

## 2018-05-20 ENCOUNTER — Other Ambulatory Visit: Payer: Self-pay | Admitting: Family Medicine

## 2018-05-20 MED ORDER — FLUCONAZOLE 150 MG PO TABS
150.0000 mg | ORAL_TABLET | Freq: Once | ORAL | 2 refills | Status: AC
Start: 2018-05-20 — End: 2018-05-20

## 2018-05-20 NOTE — Telephone Encounter (Signed)
Pt aware.

## 2018-05-20 NOTE — Telephone Encounter (Signed)
Go ahead and send in 3 pills of Diflucan 150 mg, take weekly, no refills

## 2018-05-20 NOTE — Telephone Encounter (Signed)
Dr Moore's pt.

## 2018-05-24 ENCOUNTER — Other Ambulatory Visit: Payer: Self-pay | Admitting: Family Medicine

## 2018-06-02 ENCOUNTER — Other Ambulatory Visit: Payer: Self-pay | Admitting: Family Medicine

## 2018-06-16 ENCOUNTER — Ambulatory Visit: Payer: Medicare Other | Admitting: *Deleted

## 2018-06-27 ENCOUNTER — Encounter: Payer: Self-pay | Admitting: Family Medicine

## 2018-06-27 ENCOUNTER — Other Ambulatory Visit: Payer: Medicare Other

## 2018-06-27 ENCOUNTER — Ambulatory Visit (INDEPENDENT_AMBULATORY_CARE_PROVIDER_SITE_OTHER): Payer: Medicare Other | Admitting: Family Medicine

## 2018-06-27 VITALS — BP 130/78 | HR 96 | Temp 97.3°F | Ht 64.0 in | Wt 173.0 lb

## 2018-06-27 DIAGNOSIS — N761 Subacute and chronic vaginitis: Secondary | ICD-10-CM | POA: Diagnosis not present

## 2018-06-27 DIAGNOSIS — R531 Weakness: Secondary | ICD-10-CM | POA: Diagnosis not present

## 2018-06-27 DIAGNOSIS — W57XXXD Bitten or stung by nonvenomous insect and other nonvenomous arthropods, subsequent encounter: Secondary | ICD-10-CM | POA: Diagnosis not present

## 2018-06-27 DIAGNOSIS — M2550 Pain in unspecified joint: Secondary | ICD-10-CM

## 2018-06-27 DIAGNOSIS — R5383 Other fatigue: Secondary | ICD-10-CM

## 2018-06-27 DIAGNOSIS — M791 Myalgia, unspecified site: Secondary | ICD-10-CM

## 2018-06-27 DIAGNOSIS — R300 Dysuria: Secondary | ICD-10-CM

## 2018-06-27 MED ORDER — FLUCONAZOLE 150 MG PO TABS: 150.0000 mg | ORAL_TABLET | Freq: Every day | ORAL | 0 refills | Status: DC

## 2018-06-27 NOTE — Progress Notes (Signed)
Subjective:    Patient ID: Erin Donaldson, female    DOB: Mar 12, 1951, 67 y.o.   MRN: [id number]  HPI Patient here today for 1 month follow up on weakness, arthralgias and dysuria.  The patient is continuing to have fatigue weakness arthralgias and dysurias.  She is taking Flagyl and Diflucan.  She still has some swelling in the vaginal area.  She is concerned about Lyme disease.  She has taken a course of Flagyl and has been on medication for yeast.  Because of the dysuria we will recheck a urinalysis.  The patient has never been sore in her muscles and aching in her bones like she is currently.  She has not run any fever.  She does not have any specific joints that are bothering her she is sore and aching all over.  This is been going on for about 3 months.  In conjunction with she also was bitten by a deer tick.  Everything seemed to get worse after this.  Other diagnoses to entertain would be polymyalgia rheumatica.  She also has the ongoing problem with swelling in the vaginal area and discharge.  She has been treated with both Flagyl and Diflucan.   Patient Active Problem List   Diagnosis Date Noted  . Overweight (BMI 25.0-29.9) 04/26/2018  . Senile purpura (HCC) 04/04/2018  . Abnormal EKG 12/29/2017  . Iron deficiency anemia 01/24/2016  . Swelling of right lower extremity 07/15/2015  . Right hip pain 07/15/2015  . Microcytic anemia 07/15/2015  . Elevated LFTs   . History of gastric bypass 01/15/2014  . Amnestic disorder due to medical condition 08/23/2013  . Diarrhea 07/19/2013  . Bipolar affective disorder in remission (HCC) 08/26/2009  . ANXIETY 08/26/2009  . MIGRAINE HEADACHE 08/26/2009  . Type 2 diabetes mellitus with hyperlipidemia (HCC) 08/15/2009  . Hyperlipidemia 08/15/2009  . Essential hypertension 08/15/2009  . GERD 08/15/2009  . CONSTIPATION 08/15/2009  . IRRITABLE BOWEL SYNDROME 08/15/2009   Outpatient Encounter Medications as of 06/27/2018  Medication Sig  .  donepezil (ARICEPT) 10 MG tablet TAKE 1 TABLET BY MOUTH EVERY DAY  . fluticasone (FLONASE) 50 MCG/ACT nasal spray Place 2 sprays into both nostrils daily.  . furosemide (LASIX) 20 MG tablet TAKE 1 TABLET BY MOUTH EVERY DAY  . gabapentin (NEURONTIN) 300 MG capsule TAKE 3 CAPSULE BY MOUTH THREE TIMES A DAY  . lamoTRIgine (LAMICTAL) 200 MG tablet Take 200 mg by mouth daily.  Marland Kitchen. nystatin (MYCOSTATIN) 100000 UNIT/ML suspension TAKE 5 MLS (500,000 UNITS TOTAL) BY MOUTH 4 (FOUR) TIMES DAILY.  Marland Kitchen. nystatin cream (MYCOSTATIN) APPLY 1 APPLICATION TOPICALLY 2 (TWO) TIMES DAILY.  . pantoprazole (PROTONIX) 40 MG tablet Take 2 tablets (80 mg total) by mouth daily.  . QUEtiapine (SEROQUEL) 200 MG tablet Take 200 mg by mouth daily.  . QUEtiapine (SEROQUEL) 400 MG tablet Take 800 mg by mouth at bedtime.   . rizatriptan (MAXALT) 10 MG tablet Take 1 tablet (10 mg total) by mouth as needed. May repeat in 2 hours if needed  . [DISCONTINUED] methocarbamol (ROBAXIN) 750 MG tablet TAKE 1 TABLET (750 MG TOTAL) BY MOUTH 4 (FOUR) TIMES DAILY.  . cyclobenzaprine (FLEXERIL) 10 MG tablet TAKE 1/2 TO 1 TAB TWICE A DAY AS DIRECTED  . meclizine (ANTIVERT) 12.5 MG tablet Take 1 tablet (12.5 mg total) by mouth 3 (three) times daily as needed for dizziness. (Patient not taking: Reported on 06/27/2018)  . [DISCONTINUED] metroNIDAZOLE (FLAGYL) 500 MG tablet Take 1 tablet (500 mg total)  by mouth 2 (two) times daily.   No facility-administered encounter medications on file as of 06/27/2018.      Review of Systems  Constitutional: Positive for fatigue.  HENT: Negative.   Eyes: Negative.   Respiratory: Negative.   Cardiovascular: Negative.   Gastrointestinal: Negative.   Endocrine: Negative.   Genitourinary: Negative.        Private area swollen   Musculoskeletal: Positive for arthralgias.  Skin: Negative.   Allergic/Immunologic: Negative.   Neurological: Positive for weakness.  Hematological: Negative.   Psychiatric/Behavioral:  Negative.        Objective:   Physical Exam  Constitutional: She is oriented to person, place, and time. She appears well-developed and well-nourished. She appears distressed.  HENT:  Head: Normocephalic and atraumatic.  Eyes: Pupils are equal, round, and reactive to light. Conjunctivae and EOM are normal. Right eye exhibits no discharge. Left eye exhibits no discharge. No scleral icterus.  Neck: Normal range of motion. Neck supple. No thyromegaly present.  Cardiovascular: Normal rate, regular rhythm and normal heart sounds.  No murmur heard. Heart is regular at 60/min  Pulmonary/Chest: Effort normal and breath sounds normal. No respiratory distress. She has no wheezes. She has no rales.  Abdominal: Soft. Bowel sounds are normal. She exhibits no mass. There is tenderness.  The tenderness is generalized.  Musculoskeletal: Normal range of motion. She exhibits tenderness. She exhibits no edema.  Range of motion is good and there is no joint swelling or edema noted or rash.  All muscles seem to be tender whether it is the shoulders the legs or the spine muscles.  Lymphadenopathy:    She has no cervical adenopathy.  Neurological: She is alert and oriented to person, place, and time. She has normal reflexes. No cranial nerve deficit.  Skin: Skin is warm and dry. No rash noted.  Psychiatric: She has a normal mood and affect. Her behavior is normal. Judgment and thought content normal.  This patient is tough and is not a complainer.  Nursing note and vitals reviewed.   BP 130/78 (BP Location: Left Arm)   Pulse 96   Temp (!) 97.3 F (36.3 C) (Oral)   Ht 5\' 4"  (1.626 m)   Wt 173 lb (78.5 kg)   BMI 29.70 kg/m   Vaginal exam done by the mid-level had no discharge but wet prep was done and there was a lot of redness and swelling.     Assessment & Plan:  1. Arthralgia, unspecified joint -Sed rate and CBC  2. Weakness -Sed rate  3. Other fatigue -CBC - Urine Culture - Urinalysis,  Complete  4. Dysuria - Urine Culture - Urinalysis, Complete  5. Tick bite, subsequent encounter -Tick titers  6. Chronic vaginitis -Diflucan 150 daily for 3 days  7. Myalgia -Sed rate and CBC  No orders of the defined types were placed in this encounter.  Patient Instructions  Take Tylenol for pain Take Diflucan 150 daily for 3 days We will await the results of the blood work including a CBC sed rate and tick titers We will await the results of the urinalysis and the wet prep We will review all this with you and about a week. At that time will make a decision about which direction to move forward. If the tick titers are positive we may have to come back and do doxycycline.  Nyra Capeson W. Moore MD

## 2018-06-27 NOTE — Addendum Note (Signed)
Addended by: Magdalene RiverBULLINS, Eric Morganti H on: 06/27/2018 05:34 PM   Modules accepted: Orders

## 2018-06-27 NOTE — Patient Instructions (Addendum)
Take Tylenol for pain Take Diflucan 150 daily for 3 days We will await the results of the blood work including a CBC sed rate and tick titers We will await the results of the urinalysis and the wet prep We will review all this with you and about a week. At that time will make a decision about which direction to move forward. If the tick titers are positive we may have to come back and do doxycycline.

## 2018-06-28 LAB — URINALYSIS, COMPLETE
Bilirubin, UA: NEGATIVE
Glucose, UA: NEGATIVE
Ketones, UA: NEGATIVE
Nitrite, UA: NEGATIVE
RBC, UA: NEGATIVE
Specific Gravity, UA: >1.03 — ABNORMAL HIGH (ref 1.005–1.030)
Urobilinogen, Ur: 0.2 mg/dL (ref 0.2–1.0)
pH, UA: 5.5 (ref 5.0–7.5)

## 2018-06-28 LAB — MICROSCOPIC EXAMINATION

## 2018-06-28 LAB — WET PREP FOR TRICH, YEAST, CLUE
Clue Cell Exam: NEGATIVE
Trichomonas Exam: NEGATIVE
Yeast Exam: POSITIVE — AB

## 2018-06-29 ENCOUNTER — Other Ambulatory Visit: Payer: Self-pay | Admitting: *Deleted

## 2018-06-29 LAB — CBC WITH DIFFERENTIAL/PLATELET
Basophils Absolute: 0 10*3/uL (ref 0.0–0.2)
Basos: 0 %
EOS (ABSOLUTE): 0.2 10*3/uL (ref 0.0–0.4)
Eos: 2 %
Hematocrit: 35.9 % (ref 34.0–46.6)
Hemoglobin: 11.2 g/dL (ref 11.1–15.9)
Immature Grans (Abs): 0 10*3/uL (ref 0.0–0.1)
Immature Granulocytes: 0 %
Lymphocytes Absolute: 1.9 10*3/uL (ref 0.7–3.1)
Lymphs: 20 %
MCH: 26 pg — ABNORMAL LOW (ref 26.6–33.0)
MCHC: 31.2 g/dL — ABNORMAL LOW (ref 31.5–35.7)
MCV: 83 fL (ref 79–97)
Monocytes Absolute: 0.7 10*3/uL (ref 0.1–0.9)
Monocytes: 7 %
Neutrophils Absolute: 6.6 10*3/uL (ref 1.4–7.0)
Neutrophils: 71 %
Platelets: 284 10*3/uL (ref 150–450)
RBC: 4.31 x10E6/uL (ref 3.77–5.28)
RDW: 14.3 % (ref 12.3–15.4)
WBC: 9.4 10*3/uL (ref 3.4–10.8)

## 2018-06-29 LAB — ROCKY MTN SPOTTED FVR ABS PNL(IGG+IGM)
RMSF IgG: NEGATIVE
RMSF IgM: 0.78 index (ref 0.00–0.89)

## 2018-06-29 LAB — BMP8+EGFR
BUN/Creatinine Ratio: 15 (ref 12–28)
BUN: 15 mg/dL (ref 8–27)
CO2: 24 mmol/L (ref 20–29)
Calcium: 9.2 mg/dL (ref 8.7–10.3)
Chloride: 104 mmol/L (ref 96–106)
Creatinine, Ser: 0.98 mg/dL (ref 0.57–1.00)
GFR calc Af Amer: 71 mL/min/{1.73_m2} (ref >59)
GFR calc non Af Amer: 61 mL/min/{1.73_m2} (ref >59)
Glucose: 180 mg/dL — ABNORMAL HIGH (ref 65–99)
Potassium: 4.6 mmol/L (ref 3.5–5.2)
Sodium: 143 mmol/L (ref 134–144)

## 2018-06-29 LAB — LYME AB/WESTERN BLOT REFLEX
LYME DISEASE AB, QUANT, IGM: <0.8 index (ref 0.00–0.79)
Lyme IgG/IgM Ab: <0.91 {ISR} (ref 0.00–0.90)

## 2018-06-29 LAB — URINE CULTURE

## 2018-06-29 LAB — SEDIMENTATION RATE: Sed Rate: 17 mm/hr (ref 0–40)

## 2018-06-29 MED ORDER — CEFDINIR 300 MG PO CAPS: 300.0000 mg | ORAL_CAPSULE | Freq: Two times a day (BID) | ORAL | 0 refills | Status: DC

## 2018-06-29 MED ORDER — FLUCONAZOLE 150 MG PO TABS
150.0000 mg | ORAL_TABLET | Freq: Every day | ORAL | 0 refills | Status: DC
Start: 2018-06-29 — End: 2018-08-09

## 2018-07-13 ENCOUNTER — Other Ambulatory Visit: Payer: Self-pay | Admitting: Family Medicine

## 2018-07-13 DIAGNOSIS — M549 Dorsalgia, unspecified: Secondary | ICD-10-CM

## 2018-08-07 ENCOUNTER — Other Ambulatory Visit: Payer: Self-pay | Admitting: Family Medicine

## 2018-08-08 ENCOUNTER — Other Ambulatory Visit: Payer: Self-pay | Admitting: Family Medicine

## 2018-08-09 ENCOUNTER — Ambulatory Visit (INDEPENDENT_AMBULATORY_CARE_PROVIDER_SITE_OTHER): Payer: Medicare Other | Admitting: Family Medicine

## 2018-08-09 ENCOUNTER — Encounter: Payer: Self-pay | Admitting: Family Medicine

## 2018-08-09 VITALS — BP 114/74 | HR 93 | Temp 97.9°F | Ht 64.0 in | Wt 177.0 lb

## 2018-08-09 DIAGNOSIS — K219 Gastro-esophageal reflux disease without esophagitis: Secondary | ICD-10-CM

## 2018-08-09 DIAGNOSIS — N949 Unspecified condition associated with female genital organs and menstrual cycle: Secondary | ICD-10-CM

## 2018-08-09 DIAGNOSIS — E1169 Type 2 diabetes mellitus with other specified complication: Secondary | ICD-10-CM

## 2018-08-09 DIAGNOSIS — Z13820 Encounter for screening for osteoporosis: Secondary | ICD-10-CM

## 2018-08-09 DIAGNOSIS — E785 Hyperlipidemia, unspecified: Secondary | ICD-10-CM | POA: Diagnosis not present

## 2018-08-09 DIAGNOSIS — Z1283 Encounter for screening for malignant neoplasm of skin: Secondary | ICD-10-CM

## 2018-08-09 DIAGNOSIS — I10 Essential (primary) hypertension: Secondary | ICD-10-CM

## 2018-08-09 DIAGNOSIS — H8309 Labyrinthitis, unspecified ear: Secondary | ICD-10-CM

## 2018-08-09 DIAGNOSIS — E559 Vitamin D deficiency, unspecified: Secondary | ICD-10-CM

## 2018-08-09 DIAGNOSIS — R5383 Other fatigue: Secondary | ICD-10-CM

## 2018-08-09 DIAGNOSIS — D509 Iron deficiency anemia, unspecified: Secondary | ICD-10-CM

## 2018-08-09 DIAGNOSIS — Z780 Asymptomatic menopausal state: Secondary | ICD-10-CM

## 2018-08-09 DIAGNOSIS — L03039 Cellulitis of unspecified toe: Secondary | ICD-10-CM

## 2018-08-09 LAB — BAYER DCA HB A1C WAIVED: HB A1C (BAYER DCA - WAIVED): 7.8 % — ABNORMAL HIGH (ref <7.0)

## 2018-08-09 MED ORDER — MECLIZINE HCL 25 MG PO TABS: 25.0000 mg | ORAL_TABLET | Freq: Three times a day (TID) | ORAL | 0 refills | Status: DC | PRN

## 2018-08-09 MED ORDER — CEPHALEXIN 500 MG PO CAPS: 500.0000 mg | ORAL_CAPSULE | Freq: Three times a day (TID) | ORAL | 0 refills | Status: DC

## 2018-08-09 MED ORDER — FLUCONAZOLE 150 MG PO TABS: 150.0000 mg | ORAL_TABLET | Freq: Every day | ORAL | 1 refills | Status: DC

## 2018-08-09 NOTE — Patient Instructions (Addendum)
Medicare Annual Wellness Visit  [place] and the medical providers at [facility] strive to bring you the best medical care.  In doing so we not only want to address your current medical conditions and concerns but also to detect new conditions early and prevent illness, disease and health-related problems.    Medicare offers a yearly Wellness Visit which allows our clinical staff to assess your need for preventative services including immunizations, lifestyle education, counseling to decrease risk of preventable diseases and screening for fall risk and other medical concerns.    This visit is provided free of charge (no copay) for all Medicare recipients. The clinical pharmacists at [facility] have begun to conduct these Wellness Visits which will also include a thorough review of all your medications.    As you primary medical provider recommend that you make an appointment for your Annual Wellness Visit if you have not done so already this year.  You may set up this appointment before you leave today or you may call back [phone] and schedule an appointment.  Please make sure when you call that you mention that you are scheduling your Annual Wellness Visit with the clinical pharmacist so that the appointment may be made for the proper length of time.     Continue current medications. Continue good therapeutic lifestyle changes which include good diet and exercise. Fall precautions discussed with patient. If an FOBT was given today- please return it to our front desk. If you are over 65 years old - you may need Prevnar 13 or the adult Pneumonia vaccine.  **Flu shots are available--- please call and schedule a FLU-CLINIC appointment**  After your visit with us today you will receive a survey in the mail or online from [employer] regarding your care with us. Please take a moment to fill this out. Your feedback is very  important to us as you can help us better understand your patient needs as well as improve your experience and satisfaction. WE CARE ABOUT YOU!!!   The patient should soak the foot and some warm and not hot salt water a couple times daily and take the antibiotic as directed We will arrange for her to have a visit with the dermatologist to consider biopsy of the lesions of the right ear and left hand. We will have her take Keflex for the antibiotic from the toe. We will discuss with the clinical pharmacist that the problem with her absorption of vitamin D once a week in light of the fact that she does take an antiacid and that she has had gastric bypass. We will make sure that the patient has refills on the Diflucan for yeast because of taking the antibiotic.

## 2018-08-09 NOTE — Progress Notes (Signed)
Subjective:    Patient ID: Erin Donaldson, female    DOB: 01/03/1951, 67 y.o.   MRN: [id number]  HPI Pt here for follow up and management of chronic medical problems which includes hyperlipidemia and diabetes. She is taking medication regularly.  She today has several concerns.  She has questions about vitamin D.  She complains of a lesion in her right ear and on her left hand.  She says that her left great toenail is infected.  She complains of arthralgias and increased fatigue.  She is going to get a DEXA scan today and will be given an FOBT to return and will also get lab work today.  She will schedule for her mammogram.  She needs of Diflucan refill.  Patient has a strong family history of heart disease in addition to bladder cancer and breast cancer in her mother.  She is on Aricept for memory issues and continues to take Seroquel and Lamictal from her psychiatrist.  She is taking vitamin D 50,000 units once weekly or is supposed to be.  Patient has been taking her vitamin D 50,000 units regularly.  There may be a problem with absorption because she takes an antiacid.  She also had a gastric bypass.  We will discuss this with the pharmacist to see if there is anything else we can do to improve her vitamin D levels.  She also has a couple skin lesions one on her right ear and one on her left hand which probably need biopsy and we will set up a visit with the dermatologist to see if we can get that to happen.  The grand trial pushed a chair into the left great toenail and it has been draining some.  We will most likely need an antibiotic for this.  The patient today denies any chest pain pressure tightness or shortness of breath.  She denies any trouble with swallowing heartburn indigestion nausea vomiting diarrhea or blood in the stool.  She does take an antacid.  She is passing her water without problems.  She does not check blood sugars at home.  The patient is under a lot of stress and that she has to  take care of her great-grandchild and literally has adopted this child does have help with some of her nephews.  The child is 14-1/2 years old.    Patient Active Problem List   Diagnosis Date Noted  . Overweight (BMI 25.0-29.9) 04/26/2018  . Senile purpura ([place]) 04/04/2018  . Abnormal EKG 12/29/2017  . Iron deficiency anemia 01/24/2016  . Swelling of right lower extremity 07/15/2015  . Right hip pain 07/15/2015  . Microcytic anemia 07/15/2015  . Elevated LFTs   . History of gastric bypass 01/15/2014  . Amnestic disorder due to medical condition 08/23/2013  . Diarrhea 07/19/2013  . Bipolar affective disorder in remission ([place]) 08/26/2009  . ANXIETY 08/26/2009  . MIGRAINE HEADACHE 08/26/2009  . Type 2 diabetes mellitus with hyperlipidemia ([place]) 08/15/2009  . Hyperlipidemia 08/15/2009  . Essential hypertension 08/15/2009  . GERD 08/15/2009  . CONSTIPATION 08/15/2009  . IRRITABLE BOWEL SYNDROME 08/15/2009   Outpatient Encounter Medications as of 08/09/2018  Medication Sig  . cyclobenzaprine (FLEXERIL) 10 MG tablet TAKE 1/2 TO 1 TAB TWICE A DAY AS DIRECTED  . donepezil (ARICEPT) 10 MG tablet TAKE 1 TABLET BY MOUTH EVERY DAY  . fluticasone (FLONASE) 50 MCG/ACT nasal spray Place 2 sprays into both nostrils daily.  . furosemide (LASIX) 20 MG tablet TAKE 1 TABLET  BY MOUTH EVERY DAY  . gabapentin (NEURONTIN) 300 MG capsule TAKE 3 CAPSULE BY MOUTH THREE TIMES A DAY  . lamoTRIgine (LAMICTAL) 200 MG tablet Take 200 mg by mouth daily.  . meclizine (ANTIVERT) 12.5 MG tablet Take 1 tablet (12.5 mg total) by mouth 3 (three) times daily as needed for dizziness.  . nystatin (MYCOSTATIN) 100000 UNIT/ML suspension TAKE 5 MLS (500,000 UNITS TOTAL) BY MOUTH 4 (FOUR) TIMES DAILY.  Marland Kitchen nystatin cream (MYCOSTATIN) APPLY 1 APPLICATION TOPICALLY 2 (TWO) TIMES DAILY.  . pantoprazole (PROTONIX) 40 MG tablet Take 2 tablets (80 mg total) by mouth daily.  . QUEtiapine (SEROQUEL) 200 MG tablet Take 200 mg by mouth  daily.  . QUEtiapine (SEROQUEL) 400 MG tablet Take 800 mg by mouth at bedtime.   . rizatriptan (MAXALT) 10 MG tablet Take 1 tablet (10 mg total) by mouth as needed. May repeat in 2 hours if needed  . Vitamin D, Ergocalciferol, (DRISDOL) 50000 units CAPS capsule TAKE 1 CAPSULE (50,000 UNITS TOTAL) BY MOUTH EVERY 7 (SEVEN) DAYS.  . [DISCONTINUED] cefdinir (OMNICEF) 300 MG capsule Take 1 capsule (300 mg total) by mouth 2 (two) times daily. 1 po BID  . [DISCONTINUED] cyclobenzaprine (FLEXERIL) 10 MG tablet TAKE 1/2 TO 1 TAB TWICE A DAY AS DIRECTED  . [DISCONTINUED] fluconazole (DIFLUCAN) 150 MG tablet Take 1 tablet (150 mg total) by mouth daily.   No facility-administered encounter medications on file as of 08/09/2018.      Review of Systems  Constitutional: Positive for fatigue.  HENT: Negative.   Eyes: Negative.   Respiratory: Negative.   Cardiovascular: Negative.   Gastrointestinal: Negative.   Endocrine: Negative.   Genitourinary: Positive for vaginal discharge (yeast ).  Musculoskeletal: Positive for arthralgias.  Skin: Positive for wound (left great toe infection).       Lesions - right ear and left hand   Allergic/Immunologic: Negative.   Neurological: Negative.   Hematological: Negative.   Psychiatric/Behavioral: Negative.        Objective:   Physical Exam  Constitutional: She is oriented to person, place, and time. She appears well-developed and well-nourished. No distress.  The patient is pleasant and alert and trying to do the best she can with taking care of this great-grandchild.  HENT:  Head: Normocephalic and atraumatic.  Right Ear: External ear normal.  Left Ear: External ear normal.  Mouth/Throat: Oropharynx is clear and moist. No oropharyngeal exudate.  Nasal turbinate congestion  Eyes: Pupils are equal, round, and reactive to light. Conjunctivae and EOM are normal. Right eye exhibits no discharge. Left eye exhibits no discharge. No scleral icterus.  Neck: Normal  range of motion. Neck supple. No thyromegaly present.  No bruits thyromegaly or anterior cervical adenopathy  Cardiovascular: Normal rate, regular rhythm, normal heart sounds and intact distal pulses.  No murmur heard. The heart is regular at 72/min  Pulmonary/Chest: Effort normal and breath sounds normal. She has no wheezes. She has no rales.  Clear anteriorly and posteriorly  Abdominal: Soft. Bowel sounds are normal. She exhibits no mass. There is no tenderness.  Abdominal obesity without masses tenderness organ enlargement or bruits.  No suprapubic tenderness.  Musculoskeletal: Normal range of motion. She exhibits no edema.  Lymphadenopathy:    She has no cervical adenopathy.  Neurological: She is alert and oriented to person, place, and time. She has normal reflexes. No cranial nerve deficit.  Alert and oriented with reflexes 1+ and equal bilaterally  Skin: Skin is warm and dry. No rash noted.  No erythema.  Drainage from the medial side of the left great toenail from injury to toenail.  Minimal redness.  Psychiatric: She has a normal mood and affect. Her behavior is normal. Judgment and thought content normal.  The mood affect and behavior for this patient were normal and actually good considering what she is having to do with.  Nursing note and vitals reviewed.  BP 114/74 (BP Location: Left Arm)   Pulse 93   Temp 97.9 F (36.6 C) (Oral)   Ht '5\' 4"'$  (1.626 m)   Wt 177 lb (80.3 kg)   BMI 30.38 kg/m         Assessment & Plan:  1. Type 2 diabetes mellitus with hyperlipidemia ([place]) -Continue current treatment and therapeutic lifestyle changes pending results of lab work - Microalbumin / creatinine urine ratio; Future - CBC with Differential/Platelet - Bayer DCA Hb A1c Waived  2. Gastroesophageal reflux disease, esophagitis presence not specified -No complaints with reflux today and patient will continue with her pantoprazole. - CBC with Differential/Platelet - Hepatic  function panel  3. Essential hypertension -The blood pressure is great today and she will continue with current treatment - BMP8+EGFR - CBC with Differential/Platelet - Hepatic function panel  4. Hyperlipidemia, unspecified hyperlipidemia type -Continue with therapeutic lifestyle changes - CBC with Differential/Platelet - Lipid panel  5. Iron deficiency anemia, unspecified iron deficiency anemia type -Continue with iron replacement - CBC with Differential/Platelet  6. Vitamin D deficiency -Poor vitamin D absorption?  Even though she is taking 50,000 units daily, vitamin D level is still low. - CBC with Differential/Platelet - VITAMIN D 25 Hydroxy (Vit-D Deficiency, Fractures) - DG [facility] DEXA; Future  7. Vaginal discomfort -Refill Diflucan - Urine Culture; Future - Urinalysis, Complete; Future - CBC with Differential/Platelet  8. Other fatigue - Thyroid Panel With TSH  9. Screening for osteoporosis - DG [facility] DEXA; Future  10. Postmenopausal - DG [facility] DEXA; Future  11. Infection of toenail -Keflex 500 mg 3 times daily for 10 days  12.  Labyrinthitis -Antivert 25 3 times daily as needed  13.  Abnormal skin lesion -Refer to dermatology  Meds ordered this encounter  Medications  . fluconazole (DIFLUCAN) 150 MG tablet    Sig: Take 1 tablet (150 mg total) by mouth daily.    Dispense:  3 tablet    Refill:  1  . meclizine (ANTIVERT) 25 MG tablet    Sig: Take 1 tablet (25 mg total) by mouth 3 (three) times daily as needed for dizziness.    Dispense:  30 tablet    Refill:  0  . cephALEXin (KEFLEX) 500 MG capsule    Sig: Take 1 capsule (500 mg total) by mouth 3 (three) times daily.    Dispense:  30 capsule    Refill:  0   Patient Instructions                       Medicare Annual Wellness Visit  [place] and the medical providers at [facility] strive to bring you the best medical care.  In doing so we not only want to address your current  medical conditions and concerns but also to detect new conditions early and prevent illness, disease and health-related problems.    Medicare offers a yearly Wellness Visit which allows our clinical staff to assess your need for preventative services including immunizations, lifestyle education, counseling to decrease risk of preventable diseases and screening for fall risk  and other medical concerns.    This visit is provided free of charge (no copay) for all Medicare recipients. The clinical pharmacists at [facility] have begun to conduct these Wellness Visits which will also include a thorough review of all your medications.    As you primary medical provider recommend that you make an appointment for your Annual Wellness Visit if you have not done so already this year.  You may set up this appointment before you leave today or you may call back [phone]) and schedule an appointment.  Please make sure when you call that you mention that you are scheduling your Annual Wellness Visit with the clinical pharmacist so that the appointment may be made for the proper length of time.     Continue current medications. Continue good therapeutic lifestyle changes which include good diet and exercise. Fall precautions discussed with patient. If an FOBT was given today- please return it to our front desk. If you are over 77 years old - you may need Prevnar 35 or the adult Pneumonia vaccine.  **Flu shots are available--- please call and schedule a FLU-CLINIC appointment**  After your visit with [place] today you will receive a survey in the mail or online from [employer] regarding your care with [place]. Please take a moment to fill this out. Your feedback is very important to [place] as you can help [place] better understand your patient needs as well as improve your experience and satisfaction. WE CARE ABOUT YOU!!!   The patient should soak the foot and some warm and not hot salt water a couple times  daily and take the antibiotic as directed We will arrange for her to have a visit with the dermatologist to consider biopsy of the lesions of the right ear and left hand. We will have her take Keflex for the antibiotic from the toe. We will discuss with the clinical pharmacist that the problem with her absorption of vitamin D once a week in light of the fact that she does take an antiacid and that she has had gastric bypass. We will make sure that the patient has refills on the Diflucan for yeast because of taking the antibiotic. Arrie Senate MD

## 2018-08-10 LAB — CBC WITH DIFFERENTIAL/PLATELET
Basophils Absolute: 0 10*3/uL (ref 0.0–0.2)
Basos: 0 %
EOS (ABSOLUTE): 0.2 10*3/uL (ref 0.0–0.4)
Eos: 2 %
Hematocrit: 35.4 % (ref 34.0–46.6)
Hemoglobin: 11.5 g/dL (ref 11.1–15.9)
Immature Grans (Abs): 0 10*3/uL (ref 0.0–0.1)
Immature Granulocytes: 0 %
Lymphocytes Absolute: 1.8 10*3/uL (ref 0.7–3.1)
Lymphs: 18 %
MCH: 25.6 pg — ABNORMAL LOW (ref 26.6–33.0)
MCHC: 32.5 g/dL (ref 31.5–35.7)
MCV: 79 fL (ref 79–97)
Monocytes Absolute: 0.7 10*3/uL (ref 0.1–0.9)
Monocytes: 7 %
Neutrophils Absolute: 7.4 10*3/uL — ABNORMAL HIGH (ref 1.4–7.0)
Neutrophils: 73 %
Platelets: 309 10*3/uL (ref 150–450)
RBC: 4.5 x10E6/uL (ref 3.77–5.28)
RDW: 14.4 % (ref 12.3–15.4)
WBC: 10.1 10*3/uL (ref 3.4–10.8)

## 2018-08-10 LAB — BMP8+EGFR
BUN/Creatinine Ratio: 16 (ref 12–28)
BUN: 18 mg/dL (ref 8–27)
CO2: 21 mmol/L (ref 20–29)
Calcium: 9.3 mg/dL (ref 8.7–10.3)
Chloride: 101 mmol/L (ref 96–106)
Creatinine, Ser: 1.12 mg/dL — ABNORMAL HIGH (ref 0.57–1.00)
GFR calc Af Amer: 60 mL/min/{1.73_m2} (ref >59)
GFR calc non Af Amer: 52 mL/min/{1.73_m2} — ABNORMAL LOW (ref >59)
Glucose: 230 mg/dL — ABNORMAL HIGH (ref 65–99)
Potassium: 4.8 mmol/L (ref 3.5–5.2)
Sodium: 140 mmol/L (ref 134–144)

## 2018-08-10 LAB — HEPATIC FUNCTION PANEL
ALT: 13 IU/L (ref 0–32)
AST: 13 IU/L (ref 0–40)
Albumin: 4.4 g/dL (ref 3.6–4.8)
Alkaline Phosphatase: 258 IU/L — ABNORMAL HIGH (ref 39–117)
Bilirubin Total: <0.2 mg/dL (ref 0.0–1.2)
Bilirubin, Direct: 0.06 mg/dL (ref 0.00–0.40)
Total Protein: 6.9 g/dL (ref 6.0–8.5)

## 2018-08-10 LAB — THYROID PANEL WITH TSH
Free Thyroxine Index: 0.7 — ABNORMAL LOW (ref 1.2–4.9)
T3 Uptake Ratio: 26 % (ref 24–39)
T4, Total: 2.5 ug/dL — ABNORMAL LOW (ref 4.5–12.0)
TSH: 1.86 u[IU]/mL (ref 0.450–4.500)

## 2018-08-10 LAB — LIPID PANEL
Chol/HDL Ratio: 3.2 ratio (ref 0.0–4.4)
Cholesterol, Total: 186 mg/dL (ref 100–199)
HDL: 59 mg/dL (ref >39)
LDL Calculated: 96 mg/dL (ref 0–99)
Triglycerides: 157 mg/dL — ABNORMAL HIGH (ref 0–149)
VLDL Cholesterol Cal: 31 mg/dL (ref 5–40)

## 2018-08-10 LAB — VITAMIN D 25 HYDROXY (VIT D DEFICIENCY, FRACTURES): Vit D, 25-Hydroxy: 19.4 ng/mL — ABNORMAL LOW (ref 30.0–100.0)

## 2018-08-12 ENCOUNTER — Ambulatory Visit (INDEPENDENT_AMBULATORY_CARE_PROVIDER_SITE_OTHER): Payer: Medicare Other | Admitting: *Deleted

## 2018-08-12 ENCOUNTER — Other Ambulatory Visit: Payer: Self-pay | Admitting: *Deleted

## 2018-08-12 VITALS — BP 132/80 | HR 86 | Temp 97.6°F | Ht 64.0 in | Wt 177.0 lb

## 2018-08-12 DIAGNOSIS — R7989 Other specified abnormal findings of blood chemistry: Secondary | ICD-10-CM

## 2018-08-12 DIAGNOSIS — Z0000 Encounter for general adult medical examination without abnormal findings: Secondary | ICD-10-CM

## 2018-08-12 DIAGNOSIS — R945 Abnormal results of liver function studies: Principal | ICD-10-CM

## 2018-08-12 NOTE — Progress Notes (Signed)
Subjective:   Erin Donaldson is a 67 y.o. female who presents for an Initial Medicare Annual Wellness Visit. She is out of work on disability, she had worked for [employer]. Her hobbies are her grandchildren and she doesn't get much time for herself. Her 65 year old grandson lives with her and "chasing him" is her exercise. She states that her diet is semi-healthy and she typically gets in 2-3 meals a day. She is active in church. She has 1 daughter, 3 grandchildren and soon to be 4 great-grandchildren. She does have 3 small dogs that live in the home and fall risks were discussed today. She states that her health is slightly worse than it was a year ago.   Cardiac Risk Factors include: advanced age (>14men, >60 women)     Objective:    Today's Vitals   08/12/18 1513  BP: 132/80  Pulse: 86  Temp: 97.6 F (36.4 C)  TempSrc: Oral  Weight: 177 lb (80.3 kg)  Height: 5\' 4"  (1.626 m)   Body mass index is 30.38 kg/m.  Advanced Directives 08/12/2018 01/31/2016 01/24/2016  Does Patient Have a Medical Advance Directive? No No No  Would patient like information on creating a medical advance directive? Yes (MAU/Ambulatory/Procedural Areas - Information given) No - patient declined information No - patient declined information    Current Medications (verified) Outpatient Encounter Medications as of 08/12/2018  Medication Sig  . cephALEXin (KEFLEX) 500 MG capsule Take 1 capsule (500 mg total) by mouth 3 (three) times daily.  . cyclobenzaprine (FLEXERIL) 10 MG tablet TAKE 1/2 TO 1 TAB TWICE A DAY AS DIRECTED  . donepezil (ARICEPT) 10 MG tablet TAKE 1 TABLET BY MOUTH EVERY DAY  . fluconazole (DIFLUCAN) 150 MG tablet Take 1 tablet (150 mg total) by mouth daily.  . fluticasone (FLONASE) 50 MCG/ACT nasal spray Place 2 sprays into both nostrils daily.  . furosemide (LASIX) 20 MG tablet TAKE 1 TABLET BY MOUTH EVERY DAY  . gabapentin (NEURONTIN) 300 MG capsule TAKE 3 CAPSULE BY MOUTH THREE TIMES A DAY  .  lamoTRIgine (LAMICTAL) 200 MG tablet Take 200 mg by mouth daily.  . meclizine (ANTIVERT) 25 MG tablet Take 1 tablet (25 mg total) by mouth 3 (three) times daily as needed for dizziness.  . nystatin (MYCOSTATIN) 100000 UNIT/ML suspension TAKE 5 MLS (500,000 UNITS TOTAL) BY MOUTH 4 (FOUR) TIMES DAILY.  Marland Kitchen nystatin cream (MYCOSTATIN) APPLY 1 APPLICATION TOPICALLY 2 (TWO) TIMES DAILY.  . pantoprazole (PROTONIX) 40 MG tablet Take 2 tablets (80 mg total) by mouth daily.  . QUEtiapine (SEROQUEL) 200 MG tablet Take 200 mg by mouth daily.  . QUEtiapine (SEROQUEL) 400 MG tablet Take 800 mg by mouth at bedtime.   . rizatriptan (MAXALT) 10 MG tablet Take 1 tablet (10 mg total) by mouth as needed. May repeat in 2 hours if needed  . Vitamin D, Ergocalciferol, (DRISDOL) 50000 units CAPS capsule TAKE 1 CAPSULE (50,000 UNITS TOTAL) BY MOUTH EVERY 7 (SEVEN) DAYS.   No facility-administered encounter medications on file as of 08/12/2018.     Allergies (verified) Bactrim [sulfamethoxazole-trimethoprim]; Darvocet [propoxyphene n-acetaminophen]; Decongestant [oxymetazoline]; Elavil [amitriptyline]; Risperidone and related; and Ultram [tramadol]   History: Past Medical History:  Diagnosis Date  . Anemia   . Anxiety   . Bipolar affective (HCC)   . Depression   . Diabetes in pregnancy   . Diabetes mellitus   . Elevated LFTs   . GERD (gastroesophageal reflux disease)   . Hyperlipidemia   .  Hypertension   . IBS (irritable bowel syndrome)   . Migraine    Past Surgical History:  Procedure Laterality Date  . CHOLECYSTECTOMY    . GASTRIC BYPASS  2005   Family History  Problem Relation Age of Onset  . Kidney disease Father   . Heart disease Father 71       CABG  . Diabetes Father   . CAD Father   . Heart disease Mother 56       CAD  . Breast cancer Mother   . Cancer Mother        bladder  . Heart disease Maternal Grandfather   . Heart attack Maternal Grandfather   . Stroke Maternal Grandfather   .  Heart disease Brother   . Dementia Brother   . Heart disease Maternal Aunt   . Heart disease Maternal Uncle   . Heart disease Paternal Aunt   . Heart disease Paternal Uncle   . Diabetes Maternal Aunt   . Diabetes Maternal Uncle   . Diabetes Paternal Aunt   . Diabetes Paternal Uncle   . Stroke Maternal Grandmother   . Heart disease Paternal Grandfather   . Diabetes Daughter   . Drug abuse Daughter   . Depression Sister   . Hypertension Sister   . Alcohol abuse Brother   . Drug abuse Brother   . Other Brother        accident caused brain swelling   . Colon cancer Neg Hx    Social History   Socioeconomic History  . Marital status: Widowed    Spouse name: Not on file  . Number of children: 1  . Years of education: college  . Highest education level: Not on file  Occupational History  . Occupation: Disabled    Comment: [employer]   Social Needs  . Financial resource strain: Not on file  . Food insecurity:    Worry: Not on file    Inability: Not on file  . Transportation needs:    Medical: Not on file    Non-medical: Not on file  Tobacco Use  . Smoking status: Never Smoker  . Smokeless tobacco: Never Used  Substance and Sexual Activity  . Alcohol use: No    Alcohol/week: 0.0 standard drinks    Comment: rare  . Drug use: No  . Sexual activity: Not on file  Lifestyle  . Physical activity:    Days per week: Not on file    Minutes per session: Not on file  . Stress: Not on file  Relationships  . Social connections:    Talks on phone: Not on file    Gets together: Not on file    Attends religious service: Not on file    Active member of club or organization: Not on file    Attends meetings of clubs or organizations: Not on file    Relationship status: Not on file  Other Topics Concern  . Not on file  Social History Narrative   Lives with great grandson.      Tobacco Counseling Counseling given: Not Answered    Activities of Daily Living In your present state  of health, do you have any difficulty performing the following activities: 08/12/2018  Hearing? N  Vision? N  Comment readers only   Difficulty concentrating or making decisions? N  Walking or climbing stairs? N  Dressing or bathing? N  Doing errands, shopping? N  Preparing Food and eating ? N  Using the Toilet? N  In the past six months, have you accidently leaked urine? N  Do you have problems with loss of bowel control? N  Managing your Medications? N  Managing your Finances? N  Housekeeping or managing your Housekeeping? N  Some recent data might be hidden     Immunizations and Health Maintenance Immunization History  Administered Date(s) Administered  . Influenza,inj,Quad PF,6+ Mos 10/02/2016, 10/18/2017   Health Maintenance Due  Topic Date Due  . COLONOSCOPY  04/05/2013  . OPHTHALMOLOGY EXAM  03/17/2015  . DEXA SCAN  08/10/2018  . PNA vac Low Risk Adult (1 of 2 - PCV13) 08/10/2018    Patient Care Team: Ernestina Penna, MD as PCP - General (Family Medicine)  Indicate any recent Medical Services you may have received from other than [facility] providers in the past year (date may be approximate).     Assessment:   This is a routine wellness examination for [employer].  Hearing/Vision screen No exam data present  Dietary issues and exercise activities discussed: Current Exercise Habits: Home exercise routine, Type of exercise: walking, Time (Minutes): 30, Frequency (Times/Week): 7, Weekly Exercise (Minutes/Week): 210, Intensity: Mild, Exercise limited by: None identified  Goals    . Prevent falls     Stay active and healthy       Depression Screen PHQ 2/9 Scores 08/12/2018 08/09/2018 05/13/2018 04/26/2018 04/04/2018 03/28/2018 10/18/2017  PHQ - 2 Score 1 1 6 4 5  0 0  PHQ- 9 Score - - 16 8 11  - -  Exception Documentation - - - - - - -  Not completed - - - - - - -    Fall Risk Fall Risk  08/12/2018 08/09/2018 04/26/2018 04/04/2018 03/28/2018  Falls in the past year? Yes Yes Yes  Yes No  Number falls in past yr: 1 1 1 1  -  Injury with Fall? No No No No -  Risk for fall due to : - - - - -    Is the patient's home free of loose throw rugs in walkways, pet beds, electrical cords, etc?   Fall risks and hazards were discussed today  Cognitive Function: MMSE - Mini Mental State Exam 08/12/2018  Orientation to time 5  Orientation to Place 5  Registration 3  Attention/ Calculation 5  Recall 2  Language- name 2 objects 2  Language- repeat 1  Language- follow 3 step command 3  Language- read & follow direction 1  Write a sentence 1  Copy design 1  Total score 29        Screening Tests Health Maintenance  Topic Date Due  . COLONOSCOPY  04/05/2013  . OPHTHALMOLOGY EXAM  03/17/2015  . DEXA SCAN  08/10/2018  . PNA vac Low Risk Adult (1 of 2 - PCV13) 08/10/2018  . INFLUENZA VACCINE  09/06/2018 (Originally 06/16/2018)  . MAMMOGRAM  10/18/2018 (Originally 03/01/2013)  . TETANUS/TDAP  10/18/2018 (Originally 11/16/2012)  . HIV Screening  10/18/2018 (Originally [birth year]/1968)  . HEMOGLOBIN A1C  02/07/2019  . FOOT EXAM  08/10/2019  . URINE MICROALBUMIN  08/10/2019  . PAP SMEAR  04/26/2021  . Hepatitis C Screening  Completed    Qualifies for Shingles Vaccine? Declined today  Cancer Screenings: Lung: Low Dose CT Chest recommended if Age 63-80 years, 30 pack-year currently smoking OR have quit w/in 15years. Patient does not qualify. Breast: Up to date on Mammogram? No   Up to date of Bone Density/Dexa? No Colorectal: pt has - will complete  Additional Screenings: declined Hepatitis C Screening:  Plan:   she will keep follow up with Dr Christell Constant and other specialist Advanced directive paperwork given and she will review.  She is to try and prevent falls and stay active  I have personally reviewed and noted the following in the patient's chart:   . Medical and social history . Use of alcohol, tobacco or illicit drugs  . Current medications and  supplements . Functional ability and status . Nutritional status . Physical activity . Advanced directives . List of other physicians . Hospitalizations, surgeries, and ER visits in previous 12 months . Vitals . Screenings to include cognitive, depression, and falls . Referrals and appointments  In addition, I have reviewed and discussed with patient certain preventive protocols, quality metrics, and best practice recommendations. A written personalized care plan for preventive services as well as general preventive health recommendations were provided to patient.     Leilani Merl, [place]   1/61/0960

## 2018-08-12 NOTE — Patient Instructions (Signed)
  Ms. Milhoan , Thank you for taking time to come for your Medicare Wellness Visit. I appreciate your ongoing commitment to your health goals. Please review the following plan we discussed and let me know if I can assist you in the future.   These are the goals we discussed: Goals    . Prevent falls     Stay active and healthy        This is a list of the screening recommended for you and due dates:  Health Maintenance  Topic Date Due  . Colon Cancer Screening  04/05/2013  . Eye exam for diabetics  03/17/2015  . DEXA scan (bone density measurement)  08/10/2018  . Pneumonia vaccines (1 of 2 - PCV13) 08/10/2018  . Flu Shot  09/06/2018*  . Mammogram  10/18/2018*  . Tetanus Vaccine  10/18/2018*  . HIV Screening  10/18/2018*  . Hemoglobin A1C  02/07/2019  . Complete foot exam   08/10/2019  . Urine Protein Check  08/10/2019  . Pap Smear  04/26/2021  .  Hepatitis C: One time screening is recommended by Center for Disease Control  (CDC) for  adults born from [birth year] through 1963.   Completed  *Topic was postponed. The date shown is not the original due date.     Keep follow up with Dr Christell Constant and other specialist Look over and think about the advanced directives. If you fill them out - please bring in a copy. Stay active and try to prevent falls.

## 2018-08-18 ENCOUNTER — Ambulatory Visit: Payer: Medicare Other | Admitting: Psychiatry

## 2018-08-18 ENCOUNTER — Encounter: Payer: Self-pay | Admitting: Psychiatry

## 2018-08-18 DIAGNOSIS — F411 Generalized anxiety disorder: Secondary | ICD-10-CM | POA: Diagnosis not present

## 2018-08-18 DIAGNOSIS — F3181 Bipolar II disorder: Secondary | ICD-10-CM | POA: Diagnosis not present

## 2018-08-18 DIAGNOSIS — R7989 Other specified abnormal findings of blood chemistry: Secondary | ICD-10-CM | POA: Diagnosis not present

## 2018-08-18 MED ORDER — VITAMIN D (ERGOCALCIFEROL) 1.25 MG (50000 UNIT) PO CAPS: 50000.0000 [IU] | ORAL_CAPSULE | ORAL | 3 refills | Status: DC

## 2018-08-18 NOTE — Patient Instructions (Signed)
Increase vitamin d to twice weekly

## 2018-08-18 NOTE — Progress Notes (Signed)
[facility]  Patient ID: Erin Donaldson,  MRN: [id number]  PCP: Ernestina Penna, MD  Date of Evaluation: 08/18/2018 Time spent:30 minutes   HISTORY/CURRENT STATUS: HPI  Doing ok re; mood and mood swings.  Anxiety ok except situationally.   Sleep well with Seroquel usually.  Excited about adoption of [name]. Discussed low vitamin D  Consistent with it for months. Level 19 on 08/09/18. She is satisfied with her medications.  Individual Medical History/ Review of Systems: Changes? :Yes Low vitamin D., ache all over. Tired and HA.  Allergies: Bactrim [sulfamethoxazole-trimethoprim]; Darvocet [propoxyphene n-acetaminophen]; Decongestant [oxymetazoline]; Elavil [amitriptyline]; Lithium; Risperidone and related; and Ultram [tramadol]  Current Medications:  Current Outpatient Medications:  .  cephALEXin (KEFLEX) 500 MG capsule, Take 1 capsule (500 mg total) by mouth 3 (three) times daily., Disp: 30 capsule, Rfl: 0 .  cyclobenzaprine (FLEXERIL) 10 MG tablet, TAKE 1/2 TO 1 TAB TWICE A DAY AS DIRECTED, Disp: , Rfl: 1 .  donepezil (ARICEPT) 10 MG tablet, TAKE 1 TABLET BY MOUTH EVERY DAY, Disp: 90 tablet, Rfl: 0 .  fluconazole (DIFLUCAN) 150 MG tablet, Take 1 tablet (150 mg total) by mouth daily., Disp: 3 tablet, Rfl: 1 .  fluticasone (FLONASE) 50 MCG/ACT nasal spray, Place 2 sprays into both nostrils daily., Disp: 48 g, Rfl: 1 .  furosemide (LASIX) 20 MG tablet, TAKE 1 TABLET BY MOUTH EVERY DAY, Disp: 90 tablet, Rfl: 1 .  gabapentin (NEURONTIN) 300 MG capsule, TAKE 3 CAPSULE BY MOUTH THREE TIMES A DAY, Disp: 810 capsule, Rfl: 0 .  lamoTRIgine (LAMICTAL) 200 MG tablet, Take 200 mg by mouth daily., Disp: , Rfl:  .  meclizine (ANTIVERT) 25 MG tablet, Take 1 tablet (25 mg total) by mouth 3 (three) times daily as needed for dizziness., Disp: 30 tablet, Rfl: 0 .  nystatin (MYCOSTATIN) 100000 UNIT/ML suspension, TAKE 5 MLS (500,000 UNITS TOTAL) BY MOUTH 4 (FOUR) TIMES DAILY., Disp: 473 mL,  Rfl: 0 .  nystatin cream (MYCOSTATIN), APPLY 1 APPLICATION TOPICALLY 2 (TWO) TIMES DAILY., Disp: 30 g, Rfl: 0 .  pantoprazole (PROTONIX) 40 MG tablet, Take 2 tablets (80 mg total) by mouth daily., Disp: 180 tablet, Rfl: 3 .  QUEtiapine (SEROQUEL) 200 MG tablet, Take 200 mg by mouth daily., Disp: , Rfl:  .  QUEtiapine (SEROQUEL) 400 MG tablet, Take 800 mg by mouth at bedtime. , Disp: , Rfl:  .  rizatriptan (MAXALT) 10 MG tablet, Take 1 tablet (10 mg total) by mouth as needed. May repeat in 2 hours if needed, Disp: 10 tablet, Rfl: 5 .  Vitamin D, Ergocalciferol, (DRISDOL) 50000 units CAPS capsule, Take 1 capsule (50,000 Units total) by mouth 2 (two) times a week., Disp: 24 capsule, Rfl: 3 Medication Side Effects: None  Family Medical/ Social History: Changes? Yes adoption ceremony tomorrow Erin Donaldson 3 1/65 yo.  BF rehab for physical problems.  Has to care for him too.  Adopted [employer] now in preschool.   MENTAL HEALTH EXAM:  There were no vitals taken for this visit.There is no height or weight on file to calculate BMI.  General Appearance: Casual  Eye Contact:  Good  Speech:  Clear and Coherent  Volume:  Normal  Mood:  Euthymic  Affect:  Appropriate  Thought Process:  Goal Directed  Orientation:  Full (Time, Place, and Person)  Thought Content: WDL   Suicidal Thoughts:  No  Homicidal Thoughts:  No  Memory:  Recent  Judgement:  Good  Insight:  Good  Psychomotor Activity:  Normal  Concentration:  Concentration: Good  Recall:  Good  Fund of Knowledge: Good  Language: Good  Akathisia:  No  AIMS (if indicated): not done  Assets:  Communication Skills Leisure Time Social Support  ADL's:  Intact  Cognition: WNL  Prognosis:  Good    DIAGNOSES:    ICD-10-CM   1. Bipolar II disorder (HCC) F31.81   2. Generalized anxiety disorder F41.1   3. Low serum vitamin D R79.89     RECOMMENDATIONS:  Increase vitamin d to 50K twice weekly.  It has been shown in psychiatry that low vitamin D level  are associated with increased risk of depression and dementia.  Because of her history of pseudodementia as well as depression and current body aches is recommended to get her vitamin D level up into the 50s if possible.  Clearly her current level is too low.  She understands this and agrees to the plan. She is relatively stable with regard to bipolar disorder.  She is tolerating the medications and agrees to continue them as currently prescribed. Discussed the stress of caring for 67-year-old at her age as well as a boyfriend with medical problems.  Discussed the importance of her self care.  Repeat D level in 8 weeks.    Lauraine Rinne, MD

## 2018-08-24 ENCOUNTER — Encounter: Payer: Self-pay | Admitting: Pharmacist Clinician (PhC)/ Clinical Pharmacy Specialist

## 2018-08-24 ENCOUNTER — Ambulatory Visit (INDEPENDENT_AMBULATORY_CARE_PROVIDER_SITE_OTHER): Payer: Medicare Other | Admitting: Pharmacist Clinician (PhC)/ Clinical Pharmacy Specialist

## 2018-08-24 ENCOUNTER — Ambulatory Visit (HOSPITAL_COMMUNITY): Payer: Medicare Other

## 2018-08-24 DIAGNOSIS — E1169 Type 2 diabetes mellitus with other specified complication: Secondary | ICD-10-CM

## 2018-08-24 DIAGNOSIS — Z23 Encounter for immunization: Secondary | ICD-10-CM

## 2018-08-24 DIAGNOSIS — E785 Hyperlipidemia, unspecified: Secondary | ICD-10-CM

## 2018-08-24 DIAGNOSIS — E559 Vitamin D deficiency, unspecified: Secondary | ICD-10-CM

## 2018-08-24 DIAGNOSIS — D509 Iron deficiency anemia, unspecified: Secondary | ICD-10-CM

## 2018-08-24 NOTE — Progress Notes (Signed)
Patient is referred by [name] for vitamin D deficiency.  Patient states that she had gastric bypass surgery back in 2002 and has suffered from iron deficiency since that time.  She had two iron transfusions in past years, but nothing recently.  Looking back at her ferritin levels, they have consistently been very low- levels of 5-6 in 2016-2017 and most recently in December 2018 her level was 17.  Patient has had subtherapeutic vitamin D levels over 4-19.4 starting back in 2016 thru 07/2017 despite taking vitamin D2 50,000IU weekly and more recently twice a week.  She has classic symptoms of both vitamin D and iron deficiency.  Also to note her last A1c was 7.8%  And she is not taking any medications for diabetes.  Her higher dose of Seroquel is contributing to her hyperglycemia.  A/P:  1.  Vitamin D deficiency:  Change to Vitamin D 3 50,000IU 2-3 times weekly and re-check level in 8 weeks.  2.  Start Feosol two tablets daily for iron deficiency and re-check level in 12 weeks.  3.  Type 2 DM:  Counseled patient on diabetic diet and exercise plan, she will most likely have to go back on metformin (which she has previously taken) due to her high dose of Seroquel and A1C of 7.8%.  Patient sees [name] in December.  Total time with patient:  45 minutes  Chari Manning, PharmD,. CPP, CLS

## 2018-08-26 ENCOUNTER — Other Ambulatory Visit: Payer: Self-pay | Admitting: Family Medicine

## 2018-08-29 ENCOUNTER — Telehealth: Payer: Self-pay | Admitting: Family Medicine

## 2018-08-29 ENCOUNTER — Other Ambulatory Visit: Payer: Medicare Other

## 2018-08-29 ENCOUNTER — Other Ambulatory Visit: Payer: Self-pay | Admitting: *Deleted

## 2018-08-29 DIAGNOSIS — R300 Dysuria: Secondary | ICD-10-CM

## 2018-08-29 DIAGNOSIS — E785 Hyperlipidemia, unspecified: Secondary | ICD-10-CM

## 2018-08-29 DIAGNOSIS — E1169 Type 2 diabetes mellitus with other specified complication: Secondary | ICD-10-CM

## 2018-08-29 LAB — MICROSCOPIC EXAMINATION

## 2018-08-29 LAB — URINALYSIS, COMPLETE
Bilirubin, UA: NEGATIVE
Glucose, UA: NEGATIVE
Ketones, UA: NEGATIVE
Nitrite, UA: POSITIVE — AB
Protein, UA: NEGATIVE
RBC, UA: NEGATIVE
Specific Gravity, UA: 1.015 (ref 1.005–1.030)
Urobilinogen, Ur: 0.2 mg/dL (ref 0.2–1.0)
pH, UA: 6 (ref 5.0–7.5)

## 2018-08-29 NOTE — Telephone Encounter (Signed)
Called pt - aware orders are in.

## 2018-08-30 ENCOUNTER — Other Ambulatory Visit: Payer: Self-pay | Admitting: Family Medicine

## 2018-08-30 DIAGNOSIS — M549 Dorsalgia, unspecified: Secondary | ICD-10-CM

## 2018-08-30 LAB — MICROALBUMIN / CREATININE URINE RATIO
Creatinine, Urine: 37.8 mg/dL
Microalb/Creat Ratio: 16.7 mg/g creat (ref 0.0–30.0)
Microalbumin, Urine: 6.3 ug/mL

## 2018-08-31 ENCOUNTER — Other Ambulatory Visit: Payer: Self-pay | Admitting: *Deleted

## 2018-08-31 ENCOUNTER — Ambulatory Visit (HOSPITAL_COMMUNITY)
Admission: RE | Admit: 2018-08-31 | Discharge: 2018-08-31 | Disposition: A | Discharge status: Home / Self Care | Payer: Medicare Other | Source: Ambulatory Visit | Attending: Family Medicine | Admitting: Family Medicine

## 2018-08-31 DIAGNOSIS — K760 Fatty (change of) liver, not elsewhere classified: Secondary | ICD-10-CM | POA: Diagnosis not present

## 2018-08-31 DIAGNOSIS — R945 Abnormal results of liver function studies: Secondary | ICD-10-CM | POA: Diagnosis not present

## 2018-08-31 DIAGNOSIS — Z9049 Acquired absence of other specified parts of digestive tract: Secondary | ICD-10-CM | POA: Diagnosis not present

## 2018-08-31 DIAGNOSIS — R7989 Other specified abnormal findings of blood chemistry: Secondary | ICD-10-CM

## 2018-09-01 LAB — URINE CULTURE

## 2018-09-02 ENCOUNTER — Other Ambulatory Visit: Payer: Self-pay | Admitting: *Deleted

## 2018-09-02 ENCOUNTER — Other Ambulatory Visit: Payer: Self-pay

## 2018-09-02 MED ORDER — CIPROFLOXACIN HCL 500 MG PO TABS: 500.0000 mg | ORAL_TABLET | Freq: Two times a day (BID) | ORAL | 0 refills | Status: DC

## 2018-09-02 MED ORDER — QUETIAPINE FUMARATE 400 MG PO TABS: 800.0000 mg | ORAL_TABLET | Freq: Every day | ORAL | 0 refills | Status: DC

## 2018-09-06 ENCOUNTER — Other Ambulatory Visit: Payer: Self-pay

## 2018-09-06 MED ORDER — QUETIAPINE FUMARATE 200 MG PO TABS: 200.0000 mg | ORAL_TABLET | Freq: Every day | ORAL | 3 refills | Status: DC

## 2018-09-07 ENCOUNTER — Other Ambulatory Visit: Payer: Self-pay | Admitting: Family Medicine

## 2018-09-12 ENCOUNTER — Encounter: Payer: Self-pay | Admitting: Family

## 2018-09-12 ENCOUNTER — Ambulatory Visit (INDEPENDENT_AMBULATORY_CARE_PROVIDER_SITE_OTHER): Payer: Medicare Other | Admitting: Family

## 2018-09-12 VITALS — BP 122/77 | HR 92 | Temp 97.0°F | Ht 64.0 in | Wt 175.4 lb

## 2018-09-12 DIAGNOSIS — M545 Low back pain, unspecified: Secondary | ICD-10-CM

## 2018-09-12 DIAGNOSIS — R109 Unspecified abdominal pain: Secondary | ICD-10-CM | POA: Diagnosis not present

## 2018-09-12 DIAGNOSIS — N3000 Acute cystitis without hematuria: Secondary | ICD-10-CM

## 2018-09-12 LAB — URINALYSIS, COMPLETE
Bilirubin, UA: NEGATIVE
Ketones, UA: NEGATIVE
Nitrite, UA: NEGATIVE
RBC, UA: NEGATIVE
Specific Gravity, UA: 1.02 (ref 1.005–1.030)
Urobilinogen, Ur: 0.2 mg/dL (ref 0.2–1.0)
pH, UA: 5.5 (ref 5.0–7.5)

## 2018-09-12 LAB — MICROSCOPIC EXAMINATION: Renal Epithel, UA: NONE SEEN /hpf

## 2018-09-12 MED ORDER — CIPROFLOXACIN HCL 500 MG PO TABS: 500.0000 mg | ORAL_TABLET | Freq: Two times a day (BID) | ORAL | 0 refills | Status: DC

## 2018-09-12 NOTE — Progress Notes (Signed)
   Subjective:    Patient ID: Erin Donaldson, female    DOB: Mar 26, 1951, 67 y.o.   MRN: [id number]  Chief Complaint  Patient presents with  . lower back pain and pain left lower abdomen   PT presents to the office today with recurrent UTI symptoms. She was seen on 08/29/18 and started on Cipro. States she completed this, but her symptoms have returned.  Abdominal Pain  This is a recurrent problem. The current episode started in the past 7 days. The onset quality is gradual. The problem occurs constantly. The problem has been waxing and waning. The pain is located in the LLQ. The pain is at a severity of 10/10. The pain is moderate. The quality of the pain is sharp. Associated symptoms include frequency. Pertinent negatives include no dysuria (pressure), hematuria, nausea or vomiting.  Back Pain  This is a new problem. The current episode started in the past 7 days. The problem occurs intermittently. Associated symptoms include abdominal pain. Pertinent negatives include no dysuria (pressure).      Review of Systems  Gastrointestinal: Positive for abdominal pain. Negative for nausea and vomiting.  Genitourinary: Positive for frequency. Negative for dysuria (pressure) and hematuria.  Musculoskeletal: Positive for back pain.  All other systems reviewed and are negative.      Objective:   Physical Exam  Constitutional: She is oriented to person, place, and time. She appears well-developed and well-nourished. No distress.  HENT:  Head: Normocephalic.  Eyes: Pupils are equal, round, and reactive to light.  Neck: Normal range of motion. Neck supple. No thyromegaly present.  Cardiovascular: Normal rate, regular rhythm, normal heart sounds and intact distal pulses.  No murmur heard. Pulmonary/Chest: Effort normal and breath sounds normal. No respiratory distress. She has no wheezes.  Abdominal: Soft. Bowel sounds are normal. She exhibits no distension. There is tenderness (LLQ).    Musculoskeletal: Normal range of motion. She exhibits no edema or tenderness.  Negative CVA tenderness  Neurological: She is alert and oriented to person, place, and time. She has normal reflexes. No cranial nerve deficit.  Skin: Skin is warm and dry.  Psychiatric: She has a normal mood and affect. Her behavior is normal. Judgment and thought content normal.  Vitals reviewed.     BP 122/77   Pulse 92   Temp (!) 97 F (36.1 C) (Oral)   Ht 5\' 4"  (1.626 m)   Wt 175 lb 6.4 oz (79.6 kg)   BMI 30.11 kg/m      Assessment & Plan:  Erin Donaldson comes in today with chief complaint of lower back pain and pain left lower abdomen   Diagnosis and orders addressed:  1. Acute bilateral low back pain without sciatica - Urinalysis, Complete - Urine Culture  2. Acute cystitis without hematuria Force fluids AZO over the counter X2 days RTO prn Culture pending - ciprofloxacin (CIPRO) 500 MG tablet; Take 1 tablet (500 mg total) by mouth 2 (two) times daily.  Dispense: 14 tablet; Refill: 0    Jannifer Rodney, FNP

## 2018-09-12 NOTE — Patient Instructions (Signed)

## 2018-09-14 ENCOUNTER — Ambulatory Visit: Payer: Medicare Other | Admitting: Pharmacist Clinician (PhC)/ Clinical Pharmacy Specialist

## 2018-09-14 LAB — URINE CULTURE

## 2018-09-17 ENCOUNTER — Other Ambulatory Visit: Payer: Self-pay | Admitting: Family Medicine

## 2018-09-17 DIAGNOSIS — M549 Dorsalgia, unspecified: Secondary | ICD-10-CM

## 2018-09-19 NOTE — Telephone Encounter (Signed)
Last seen 09/12/18  DWM

## 2018-10-03 ENCOUNTER — Ambulatory Visit (INDEPENDENT_AMBULATORY_CARE_PROVIDER_SITE_OTHER): Payer: Medicare Other | Admitting: Family Medicine

## 2018-10-03 ENCOUNTER — Ambulatory Visit: Payer: Self-pay | Admitting: Family Medicine

## 2018-10-03 ENCOUNTER — Encounter: Payer: Self-pay | Admitting: Family Medicine

## 2018-10-03 VITALS — BP 135/85 | HR 101 | Temp 98.2°F | Ht 64.0 in | Wt 172.4 lb

## 2018-10-03 DIAGNOSIS — R5383 Other fatigue: Secondary | ICD-10-CM | POA: Diagnosis not present

## 2018-10-03 DIAGNOSIS — E559 Vitamin D deficiency, unspecified: Secondary | ICD-10-CM | POA: Diagnosis not present

## 2018-10-03 DIAGNOSIS — M130 Polyarthritis, unspecified: Secondary | ICD-10-CM

## 2018-10-03 MED ORDER — DULOXETINE HCL 30 MG PO CPEP: 30.0000 mg | ORAL_CAPSULE | Freq: Every day | ORAL | 0 refills | Status: DC

## 2018-10-03 NOTE — Patient Instructions (Signed)
Joint Pain Joint pain can be caused by many things. The joint can be bruised, infected, weak from aging, or sore from exercise. The pain will probably go away if you follow your doctor's instructions for home care. If your joint pain continues, more tests may be needed to help find the cause of your condition. Follow these instructions at home: Watch your condition for any changes. Follow these instructions as told to lessen the pain that you are feeling:  Take medicines only as told by your doctor.  Rest the sore joint for as long as told by your doctor. If your doctor tells you to, raise (elevate) the painful joint above the level of your heart while you are sitting or lying down.  Do not do things that cause pain or make the pain worse.  If told, put ice on the painful area: ? Put ice in a plastic bag. ? Place a towel between your skin and the bag. ? Leave the ice on for 20 minutes, 2-3 times per day.  Wear an elastic bandage, splint, or sling as told by your doctor. Loosen the bandage or splint if your fingers or toes lose feeling (become numb) and tingle, or if they turn cold and blue.  Begin exercising or stretching the joint as told by your doctor. Ask your doctor what types of exercise are safe for you.  Keep all follow-up visits as told by your doctor. This is important.  Contact a doctor if:  Your pain gets worse and medicine does not help it.  Your joint pain does not get better in 3 days.  You have more bruising or swelling.  You have a fever.  You lose 10 pounds (4.5 kg) or more without trying. Get help right away if:  You are not able to move the joint.  Your fingers or toes become numb or they turn cold and blue. This information is not intended to replace advice given to you by your health care provider. Make sure you discuss any questions you have with your health care provider. Document Released: 10/21/2009 Document Revised: 04/09/2016 Document Reviewed:  08/14/2014 Elsevier Interactive Patient Education  2018 [employer]  

## 2018-10-03 NOTE — Progress Notes (Signed)
Subjective:    Patient ID: Erin Donaldson, female    DOB: 1951-01-28, 67 y.o.   MRN: [id number]  Chief Complaint:  Muscular and joint pain all over body, headaches and Discuss Vitamin D level (would like to get rechecked, has been almost 3 months, psychiatrist wanted checked after 2 months)   HPI: Erin Donaldson is a 67 y.o. female presenting on 10/03/2018 for Muscular and joint pain all over body, headaches and Discuss Vitamin D level (would like to get rechecked, has been almost 3 months, psychiatrist wanted checked after 2 months)  Pt presents today with ongoing polyarthralgia, wide spread myalgias, fatigue, and intermittent headaches. She reports the arthralgias and myalgias have been ongoing for over 6 months. She had a negative [facility] Spotted Fever and Lyme disease titers on 06/27/2018. Her sed rate was also negative at that time.  She reports the myalgias and arthralgias have become worse and are now accompanied by fatigue and intermittent headaches. She denies continuous headaches or auras with her headaches. States her neck muscles will spasm causing the headache. States she will take her muscle relaxer and this relieves the headache. She denies fever, chills, or changes in weight.  She reports her psychiatrist would like her to have her Vitamin D level rechecked. She reports she is taking her weekly supplements as prescribed. States she continues to have fatigue.   Relevant past medical, surgical, family, and social history reviewed and updated as indicated.  Allergies and medications reviewed and updated.   Past Medical History:  Diagnosis Date  . Anemia   . Anxiety   . Bipolar affective (HCC)   . Depression   . Diabetes in pregnancy   . Diabetes mellitus   . Elevated LFTs   . GERD (gastroesophageal reflux disease)   . Hyperlipidemia   . Hypertension   . IBS (irritable bowel syndrome)   . Migraine     Past Surgical History:  Procedure Laterality Date  .  CHOLECYSTECTOMY    . GASTRIC BYPASS  2005    Social History   Socioeconomic History  . Marital status: Widowed    Spouse name: Not on file  . Number of children: 1  . Years of education: college  . Highest education level: Not on file  Occupational History  . Occupation: Disabled    Comment: [employer]   Social Needs  . Financial resource strain: Not on file  . Food insecurity:    Worry: Not on file    Inability: Not on file  . Transportation needs:    Medical: Not on file    Non-medical: Not on file  Tobacco Use  . Smoking status: Never Smoker  . Smokeless tobacco: Never Used  Substance and Sexual Activity  . Alcohol use: No    Alcohol/week: 0.0 standard drinks    Comment: rare  . Drug use: No  . Sexual activity: Not on file  Lifestyle  . Physical activity:    Days per week: Not on file    Minutes per session: Not on file  . Stress: Not on file  Relationships  . Social connections:    Talks on phone: Not on file    Gets together: Not on file    Attends religious service: Not on file    Active member of club or organization: Not on file    Attends meetings of clubs or organizations: Not on file    Relationship status: Not on file  . Intimate partner  violence:    Fear of current or ex partner: Not on file    Emotionally abused: Not on file    Physically abused: Not on file    Forced sexual activity: Not on file  Other Topics Concern  . Not on file  Social History Narrative   Lives with great grandson.      Outpatient Encounter Medications as of 10/03/2018  Medication Sig  . cyclobenzaprine (FLEXERIL) 10 MG tablet TAKE 1/2 TO 1 TAB TWICE A DAY AS DIRECTED  . donepezil (ARICEPT) 10 MG tablet TAKE 1 TABLET BY MOUTH EVERY DAY  . fluconazole (DIFLUCAN) 150 MG tablet Take 1 tablet (150 mg total) by mouth daily.  . fluticasone (FLONASE) 50 MCG/ACT nasal spray Place 2 sprays into both nostrils daily.  . furosemide (LASIX) 20 MG tablet TAKE 1 TABLET BY MOUTH EVERY DAY  .  gabapentin (NEURONTIN) 300 MG capsule TAKE 3 CAPSULE BY MOUTH THREE TIMES A DAY  . lamoTRIgine (LAMICTAL) 200 MG tablet Take 200 mg by mouth daily.  . meclizine (ANTIVERT) 25 MG tablet Take 1 tablet (25 mg total) by mouth 3 (three) times daily as needed for dizziness.  . nystatin (MYCOSTATIN) 100000 UNIT/ML suspension TAKE 5 MLS (500,000 UNITS TOTAL) BY MOUTH 4 (FOUR) TIMES DAILY.  Marland Kitchen nystatin cream (MYCOSTATIN) APPLY 1 APPLICATION TOPICALLY 2 (TWO) TIMES DAILY.  . pantoprazole (PROTONIX) 40 MG tablet Take 2 tablets (80 mg total) by mouth daily.  . QUEtiapine (SEROQUEL) 200 MG tablet Take 1 tablet (200 mg total) by mouth daily.  . QUEtiapine (SEROQUEL) 400 MG tablet Take 2 tablets (800 mg total) by mouth at bedtime.  . rizatriptan (MAXALT) 10 MG tablet Take 1 tablet (10 mg total) by mouth as needed. May repeat in 2 hours if needed  . [DISCONTINUED] ciprofloxacin (CIPRO) 500 MG tablet Take 1 tablet (500 mg total) by mouth 2 (two) times daily.  . DULoxetine (CYMBALTA) 30 MG capsule Take 1 capsule (30 mg total) by mouth daily.  . Vitamin D, Ergocalciferol, (DRISDOL) 1.25 MG (50000 UT) CAPS capsule TAKE 1 CAPSULE (50,000 UNITS TOTAL) BY MOUTH 2 (TWO) TIMES A WEEK.   No facility-administered encounter medications on file as of 10/03/2018.     Allergies  Allergen Reactions  . Bactrim [Sulfamethoxazole-Trimethoprim]     Raw and irritatated in mouth.  Leodis Liverpool [Propoxyphene N-Acetaminophen]   . Decongestant [Oxymetazoline]   . Elavil [Amitriptyline]   . Lithium Other (See Comments)    Hx toxicity  . Risperidone And Related   . Ultram [Tramadol]     Review of Systems  Constitutional: Positive for activity change and fatigue. Negative for chills and fever.  HENT: Negative for sinus pressure and tinnitus.   Eyes: Negative for photophobia and visual disturbance.  Respiratory: Negative for cough and shortness of breath.   Cardiovascular: Negative for chest pain, palpitations and leg swelling.    Gastrointestinal: Negative for abdominal pain, constipation, nausea and vomiting.  Musculoskeletal: Positive for arthralgias, back pain, myalgias and neck stiffness. Negative for gait problem, joint swelling and neck pain.  Skin: Negative for color change and rash.  Neurological: Positive for weakness (generalized) and headaches. Negative for dizziness, seizures, syncope, light-headedness and numbness.  Hematological: Does not bruise/bleed easily.  Psychiatric/Behavioral: Negative for confusion.  All other systems reviewed and are negative.       Objective:    BP 135/85   Pulse (!) 101   Temp 98.2 F (36.8 C) (Oral)   Ht 5\' 4"  (1.626 m)  Wt 172 lb 6 oz (78.2 kg)   BMI 29.59 kg/m    Wt Readings from Last 3 Encounters:  10/03/18 172 lb 6 oz (78.2 kg)  09/12/18 175 lb 6.4 oz (79.6 kg)  08/12/18 177 lb (80.3 kg)    Physical Exam  Constitutional: She is oriented to person, place, and time. She appears well-developed and well-nourished. She is cooperative. She appears distressed (mild).  HENT:  Head: Normocephalic and atraumatic.  Eyes: Pupils are equal, round, and reactive to light. Conjunctivae and EOM are normal.  Neck: Trachea normal, normal range of motion and phonation normal. Neck supple. No JVD present. Carotid bruit is not present. No thyroid mass and no thyromegaly present.  Cardiovascular: Normal rate, regular rhythm, normal heart sounds and intact distal pulses. Exam reveals no gallop and no friction rub.  No murmur heard. Pulmonary/Chest: Effort normal and breath sounds normal. No respiratory distress.  Abdominal: Soft. Bowel sounds are normal.  Musculoskeletal: Normal range of motion.  Widespread muscle and joint tenderness. No erythema, swelling, crepitus, deformities, or rashes present. ROM normal throughout.   Neurological: She is alert and oriented to person, place, and time. She has normal strength. No cranial nerve deficit or sensory deficit.  Skin: Skin is  warm, dry and intact. Capillary refill takes less than 2 seconds. No rash noted.  Psychiatric: She has a normal mood and affect. Her speech is normal and behavior is normal. Judgment and thought content normal. Cognition and memory are normal.  Nursing note and vitals reviewed.   Results for orders placed or performed in visit on 09/12/18  Urine Culture  Result Value Ref Range   Urine Culture, Routine Final report    Organism ID, Bacteria Comment   Microscopic Examination  Result Value Ref Range   WBC, UA 6-10 (A) 0 - 5 /hpf   RBC, UA 0-2 0 - 2 /hpf   Epithelial Cells (non renal) 0-10 0 - 10 /hpf   Renal Epithel, UA None seen None seen /hpf   Bacteria, UA Few None seen/Few  Urinalysis, Complete  Result Value Ref Range   Specific Gravity, UA 1.020 1.005 - 1.030   pH, UA 5.5 5.0 - 7.5   Color, UA Yellow Yellow   Appearance Ur Clear Clear   Leukocytes, UA 1+ (A) Negative   Protein, UA Trace (A) Negative/Trace   Glucose, UA 1+ (A) Negative   Ketones, UA Negative Negative   RBC, UA Negative Negative   Bilirubin, UA Negative Negative   Urobilinogen, Ur 0.2 0.2 - 1.0 mg/dL   Nitrite, UA Negative Negative   Microscopic Examination See below:        Pertinent labs & imaging results that were available during my care of the patient were reviewed by me and considered in my medical decision making.  Assessment & Plan:  Erin Donaldson was seen today for muscular and joint pain all over body, headaches and discuss vitamin d level.  Diagnoses and all orders for this visit:  Polyarthritis Ongoing for several months. Will check ANA and RA today. Referral made to Rheumatology. Considered Fibromyalgia. Will trial Cymbalta 30 mg daily. Return in 2 weeks for reevaluation.  -     ANA,IFA RA Diag Pnl w/rflx Tit/Patn -     Ambulatory referral to Rheumatology -     DULoxetine (CYMBALTA) 30 MG capsule; Take 1 capsule (30 mg total) by mouth daily.  Vitamin D deficiency History of vitamin D deficiency,  currently taking weekly vitamin D supplement. Ongoing fatigue. Will  recheck level today.  -     VITAMIN D 25 Hydroxy (Vit-D Deficiency, Fractures)  Other fatigue -     VITAMIN D 25 Hydroxy (Vit-D Deficiency, Fractures) -     ANA,IFA RA Diag Pnl w/rflx Tit/Patn      Continue all other maintenance medications.  Follow up plan: Return in about 2 weeks (around 10/17/2018).  Educational handout given for joint pain  The above assessment and management plan was discussed with the patient. The patient verbalized understanding of and has agreed to the management plan. Patient is aware to call the clinic if symptoms persist or worsen. Patient is aware when to return to the clinic for a follow-up visit. Patient educated on when it is appropriate to go to the emergency department.   Kari Baars, FNP-C [facility] [phone]

## 2018-10-04 LAB — ANA,IFA RA DIAG PNL W/RFLX TIT/PATN
ANA Titer 1: NEGATIVE
Cyclic Citrullin Peptide Ab: 7 units (ref 0–19)
Rhuematoid fact SerPl-aCnc: <10 IU/mL (ref 0.0–13.9)

## 2018-10-04 LAB — VITAMIN D 25 HYDROXY (VIT D DEFICIENCY, FRACTURES): Vit D, 25-Hydroxy: 26.5 ng/mL — ABNORMAL LOW (ref 30.0–100.0)

## 2018-10-10 ENCOUNTER — Other Ambulatory Visit: Payer: Self-pay | Admitting: Family Medicine

## 2018-10-18 NOTE — Progress Notes (Signed)
Acute Office Visit  Subjective:    Patient ID: Erin Donaldson, female    DOB: January 27, 1951, 67 y.o.   MRN: [id number]  No chief complaint on file.   HPI Patient is in today for a 2 week recheck of fatigue and polyarthritis.  The patient says that she has been having the increased arthralgias for at least 6 months.  She has had more fatigue during that time.  She denies any chest pain pressure tightness shortness of breath change in bowel habits blood in the stool or trouble with passing her water.  She also says her depression has been worse.  6 weeks ago it was found that her vitamin D level was low and her vitamin D was raised by Dr. Charlott Holler to 100,000 units weekly.  Is still having problems.  I am not sure if we can do more vitamin D than she is currently doing and probably the vitamin D absorption has something to do with her gastric bypass surgery I would suspect.  The patient does indicate that she has been especially down and more depressed since her mother-in-law died that she has known for years and her mother died over 54 years ago.  The recent death of her mother wall seems to have aggravated her fatigue and depression.      Patient Active Problem List   Diagnosis Date Noted  . Vitamin D deficiency 10/03/2018  . Overweight (BMI 25.0-29.9) 04/26/2018  . Senile purpura ([place]) 04/04/2018  . Abnormal EKG 12/29/2017  . Iron deficiency anemia 01/24/2016  . Swelling of right lower extremity 07/15/2015  . Right hip pain 07/15/2015  . Microcytic anemia 07/15/2015  . Elevated LFTs   . History of gastric bypass 01/15/2014  . Amnestic disorder due to medical condition ([place]) 08/23/2013  . Diarrhea 07/19/2013  . Bipolar affective disorder in remission ([place]) 08/26/2009  . ANXIETY 08/26/2009  . MIGRAINE HEADACHE 08/26/2009  . Type 2 diabetes mellitus with hyperlipidemia ([place]) 08/15/2009  . Hyperlipidemia 08/15/2009  . Essential hypertension 08/15/2009  . GERD 08/15/2009  . CONSTIPATION  08/15/2009  . IRRITABLE BOWEL SYNDROME 08/15/2009   Outpatient Encounter Medications as of 10/19/2018  Medication Sig  . cyclobenzaprine (FLEXERIL) 10 MG tablet TAKE 1/2 TO 1 TAB TWICE A DAY AS DIRECTED  . donepezil (ARICEPT) 10 MG tablet TAKE 1 TABLET BY MOUTH EVERY DAY  . DULoxetine (CYMBALTA) 30 MG capsule Take 1 capsule (30 mg total) by mouth daily.  . fluconazole (DIFLUCAN) 150 MG tablet Take 1 tablet (150 mg total) by mouth daily.  . fluticasone (FLONASE) 50 MCG/ACT nasal spray Place 2 sprays into both nostrils daily.  . furosemide (LASIX) 20 MG tablet TAKE 1 TABLET BY MOUTH EVERY DAY  . gabapentin (NEURONTIN) 300 MG capsule TAKE 3 CAPSULE BY MOUTH THREE TIMES A DAY  . lamoTRIgine (LAMICTAL) 200 MG tablet Take 200 mg by mouth daily.  . meclizine (ANTIVERT) 25 MG tablet Take 1 tablet (25 mg total) by mouth 3 (three) times daily as needed for dizziness.  . nystatin (MYCOSTATIN) 100000 UNIT/ML suspension TAKE 5 MLS (500,000 UNITS TOTAL) BY MOUTH 4 (FOUR) TIMES DAILY.  Marland Kitchen nystatin cream (MYCOSTATIN) APPLY 1 APPLICATION TOPICALLY 2 (TWO) TIMES DAILY.  . pantoprazole (PROTONIX) 40 MG tablet Take 2 tablets (80 mg total) by mouth daily.  . QUEtiapine (SEROQUEL) 200 MG tablet Take 1 tablet (200 mg total) by mouth daily.  . QUEtiapine (SEROQUEL) 400 MG tablet Take 2 tablets (800 mg total) by mouth at bedtime.  Marland Kitchen  rizatriptan (MAXALT) 10 MG tablet Take 1 tablet (10 mg total) by mouth as needed. May repeat in 2 hours if needed  . Vitamin D, Ergocalciferol, (DRISDOL) 1.25 MG (50000 UT) CAPS capsule TAKE 1 CAPSULE (50,000 UNITS TOTAL) BY MOUTH 2 (TWO) TIMES A WEEK.   No facility-administered encounter medications on file as of 10/19/2018.      Past Medical History:  Diagnosis Date  . Anemia   . Anxiety   . Bipolar affective ([place])   . Depression   . Diabetes in pregnancy   . Diabetes mellitus   . Elevated LFTs   . GERD (gastroesophageal reflux disease)   . Hyperlipidemia   . Hypertension   .  IBS (irritable bowel syndrome)   . Migraine     Past Surgical History:  Procedure Laterality Date  . CHOLECYSTECTOMY    . GASTRIC BYPASS  2005    Family History  Problem Relation Age of Onset  . Kidney disease Father   . Heart disease Father 74       CABG  . Diabetes Father   . CAD Father   . Heart disease Mother 24       CAD  . Breast cancer Mother   . Cancer Mother        bladder  . Heart disease Maternal Grandfather   . Heart attack Maternal Grandfather   . Stroke Maternal Grandfather   . Heart disease Brother   . Dementia Brother   . Heart disease Maternal Aunt   . Heart disease Maternal Uncle   . Heart disease Paternal Aunt   . Heart disease Paternal Uncle   . Diabetes Maternal Aunt   . Diabetes Maternal Uncle   . Diabetes Paternal Aunt   . Diabetes Paternal Uncle   . Stroke Maternal Grandmother   . Heart disease Paternal Grandfather   . Diabetes Daughter   . Drug abuse Daughter   . Depression Sister   . Hypertension Sister   . Alcohol abuse Brother   . Drug abuse Brother   . Other Brother        accident caused brain swelling   . Colon cancer Neg Hx     Social History   Socioeconomic History  . Marital status: Widowed    Spouse name: Not on file  . Number of children: 1  . Years of education: college  . Highest education level: Not on file  Occupational History  . Occupation: Disabled    Comment: [employer]   Social Needs  . Financial resource strain: Not on file  . Food insecurity:    Worry: Not on file    Inability: Not on file  . Transportation needs:    Medical: Not on file    Non-medical: Not on file  Tobacco Use  . Smoking status: Never Smoker  . Smokeless tobacco: Never Used  Substance and Sexual Activity  . Alcohol use: No    Alcohol/week: 0.0 standard drinks    Comment: rare  . Drug use: No  . Sexual activity: Not on file  Lifestyle  . Physical activity:    Days per week: Not on file    Minutes per session: Not on file  . Stress:  Not on file  Relationships  . Social connections:    Talks on phone: Not on file    Gets together: Not on file    Attends religious service: Not on file    Active member of club or organization: Not on file  Attends meetings of clubs or organizations: Not on file    Relationship status: Not on file  . Intimate partner violence:    Fear of current or ex partner: Not on file    Emotionally abused: Not on file    Physically abused: Not on file    Forced sexual activity: Not on file  Other Topics Concern  . Not on file  Social History Narrative   Lives with great grandson.      Outpatient Medications Prior to Visit  Medication Sig Dispense Refill  . cyclobenzaprine (FLEXERIL) 10 MG tablet TAKE 1/2 TO 1 TAB TWICE A DAY AS DIRECTED 30 tablet 2  . donepezil (ARICEPT) 10 MG tablet TAKE 1 TABLET BY MOUTH EVERY DAY 90 tablet 0  . DULoxetine (CYMBALTA) 30 MG capsule Take 1 capsule (30 mg total) by mouth daily. 30 capsule 0  . fluconazole (DIFLUCAN) 150 MG tablet Take 1 tablet (150 mg total) by mouth daily. 3 tablet 1  . fluticasone (FLONASE) 50 MCG/ACT nasal spray Place 2 sprays into both nostrils daily. 48 g 1  . furosemide (LASIX) 20 MG tablet TAKE 1 TABLET BY MOUTH EVERY DAY 90 tablet 1  . gabapentin (NEURONTIN) 300 MG capsule TAKE 3 CAPSULE BY MOUTH THREE TIMES A DAY 810 capsule 0  . lamoTRIgine (LAMICTAL) 200 MG tablet Take 200 mg by mouth daily.    . meclizine (ANTIVERT) 25 MG tablet Take 1 tablet (25 mg total) by mouth 3 (three) times daily as needed for dizziness. 30 tablet 0  . nystatin (MYCOSTATIN) 100000 UNIT/ML suspension TAKE 5 MLS (500,000 UNITS TOTAL) BY MOUTH 4 (FOUR) TIMES DAILY. 473 mL 0  . nystatin cream (MYCOSTATIN) APPLY 1 APPLICATION TOPICALLY 2 (TWO) TIMES DAILY. 30 g 0  . pantoprazole (PROTONIX) 40 MG tablet Take 2 tablets (80 mg total) by mouth daily. 180 tablet 3  . QUEtiapine (SEROQUEL) 200 MG tablet Take 1 tablet (200 mg total) by mouth daily. 90 tablet 3  .  QUEtiapine (SEROQUEL) 400 MG tablet Take 2 tablets (800 mg total) by mouth at bedtime. 180 tablet 0  . rizatriptan (MAXALT) 10 MG tablet Take 1 tablet (10 mg total) by mouth as needed. May repeat in 2 hours if needed 10 tablet 5  . Vitamin D, Ergocalciferol, (DRISDOL) 1.25 MG (50000 UT) CAPS capsule TAKE 1 CAPSULE (50,000 UNITS TOTAL) BY MOUTH 2 (TWO) TIMES A WEEK.  3   No facility-administered medications prior to visit.     Allergies  Allergen Reactions  . Bactrim [Sulfamethoxazole-Trimethoprim]     Raw and irritatated in mouth.  Carlton Adam [Propoxyphene N-Acetaminophen]   . Decongestant [Oxymetazoline]   . Elavil [Amitriptyline]   . Lithium Other (See Comments)    Hx toxicity  . Risperidone And Related   . Ultram [Tramadol]     Review of Systems  Constitutional: Positive for malaise/fatigue.  Musculoskeletal: Positive for joint pain (all over) and myalgias (all over).       Objective:    Physical Exam  Constitutional: She is oriented to person, place, and time. She appears well-developed and well-nourished. No distress.  HENT:  Head: Normocephalic and atraumatic.  Right Ear: External ear normal.  Left Ear: External ear normal.  Nose: Nose normal.  Mouth/Throat: Oropharynx is clear and moist.  Eyes: Pupils are equal, round, and reactive to light. Conjunctivae and EOM are normal. Right eye exhibits no discharge. Left eye exhibits no discharge. No scleral icterus.  Neck: Normal range of motion. Neck supple. No  thyromegaly present.  Cardiovascular: Normal rate, regular rhythm and normal heart sounds.  No murmur heard. Diminished pulses right lower extremity compared to the left  Pulmonary/Chest: Effort normal and breath sounds normal. She has no wheezes. She has no rales.  Clear anteriorly and posteriorly  Abdominal: Soft. Bowel sounds are normal. She exhibits no mass. There is no tenderness. There is no rebound.  Abdomen is obese with generalized tenderness and no masses  or organ enlargement  Musculoskeletal: Normal range of motion. She exhibits tenderness. She exhibits no edema.  The proximal joints to the trunk are more tender than the more peripheral joints.  The muscles and her abdomen and extremities are tender to palpation.  There is no warmth or rubor.  There is no swelling.  Lymphadenopathy:    She has no cervical adenopathy.  Neurological: She is alert and oriented to person, place, and time. She has normal reflexes. No cranial nerve deficit.  Skin: Skin is warm and dry. No rash noted.  Psychiatric: She has a normal mood and affect. Her behavior is normal. Judgment and thought content normal.  Somewhat more depressed affect than usual.  Nursing note and vitals reviewed.   BP 120/79   Pulse 87   Temp 98.2 F (36.8 C) (Oral)   Ht '5\' 4"'$  (1.626 m)   Wt 177 lb 9.6 oz (80.6 kg)   BMI 30.48 kg/m    There were no vitals taken for this visit. Wt Readings from Last 3 Encounters:  10/03/18 172 lb 6 oz (78.2 kg)  09/12/18 175 lb 6.4 oz (79.6 kg)  08/12/18 177 lb (80.3 kg)    Health Maintenance Due  Topic Date Due  . COLONOSCOPY  04/05/2013  . OPHTHALMOLOGY EXAM  03/17/2015  . DEXA SCAN  08/10/2018  . PNA vac Low Risk Adult (1 of 2 - PCV13) 08/10/2018    There are no preventive care reminders to display for this patient.   Lab Results  Component Value Date   TSH 1.860 08/09/2018   Lab Results  Component Value Date   WBC 10.1 08/09/2018   HGB 11.5 08/09/2018   HCT 35.4 08/09/2018   MCV 79 08/09/2018   PLT 309 08/09/2018   Lab Results  Component Value Date   NA 140 08/09/2018   K 4.8 08/09/2018   CHLORIDE 109 01/24/2016   CO2 21 08/09/2018   GLUCOSE 230 (H) 08/09/2018   BUN 18 08/09/2018   CREATININE 1.12 (H) 08/09/2018   BILITOT <0.2 08/09/2018   ALKPHOS 258 (H) 08/09/2018   AST 13 08/09/2018   ALT 13 08/09/2018   PROT 6.9 08/09/2018   ALBUMIN 4.4 08/09/2018   CALCIUM 9.3 08/09/2018   ANIONGAP 10 01/24/2016   EGFR 59 (L)  01/24/2016   Lab Results  Component Value Date   CHOL 186 08/09/2018   Lab Results  Component Value Date   HDL 59 08/09/2018   Lab Results  Component Value Date   LDLCALC 96 08/09/2018   Lab Results  Component Value Date   TRIG 157 (H) 08/09/2018   Lab Results  Component Value Date   CHOLHDL 3.2 08/09/2018   Lab Results  Component Value Date   HGBA1C 6.3 12/03/2015       Assessment & Plan:   Problem List Items Addressed This Visit    None       No orders of the defined types were placed in this encounter.    Maxwell Lemen K    Meds ordered this  encounter  Medications  . DULoxetine (CYMBALTA) 30 MG capsule    Sig: Take 2 capsules (60 mg total) by mouth daily.    Dispense:  360 capsule    Refill:  1   Patient Instructions  Continue to drink plenty of fluids and stay well-hydrated We will call with lab work results as soon as these results become available We will be checking a vitamin D level sedimentation rate CBC thyroid profile and vitamin B12 level. Increase Cymbalta to 60 mg daily We will check on how to best get your vitamin D level up knowing that you have had gastric bypass surgery  Arrie Senate MD

## 2018-10-19 ENCOUNTER — Ambulatory Visit (INDEPENDENT_AMBULATORY_CARE_PROVIDER_SITE_OTHER): Payer: Medicare Other | Admitting: Family Medicine

## 2018-10-19 ENCOUNTER — Encounter: Payer: Self-pay | Admitting: Family Medicine

## 2018-10-19 VITALS — BP 120/79 | HR 87 | Temp 98.2°F | Ht 64.0 in | Wt 177.6 lb

## 2018-10-19 DIAGNOSIS — M130 Polyarthritis, unspecified: Secondary | ICD-10-CM | POA: Diagnosis not present

## 2018-10-19 DIAGNOSIS — F321 Major depressive disorder, single episode, moderate: Secondary | ICD-10-CM | POA: Diagnosis not present

## 2018-10-19 DIAGNOSIS — R5383 Other fatigue: Secondary | ICD-10-CM

## 2018-10-19 DIAGNOSIS — M791 Myalgia, unspecified site: Secondary | ICD-10-CM

## 2018-10-19 DIAGNOSIS — E559 Vitamin D deficiency, unspecified: Secondary | ICD-10-CM | POA: Diagnosis not present

## 2018-10-19 MED ORDER — DULOXETINE HCL 30 MG PO CPEP: 60.0000 mg | ORAL_CAPSULE | Freq: Every day | ORAL | 1 refills | Status: DC

## 2018-10-19 NOTE — Patient Instructions (Addendum)
Continue to drink plenty of fluids and stay well-hydrated We will call with lab work results as soon as these results become available We will be checking a vitamin D level sedimentation rate CBC thyroid profile and vitamin B12 level. Increase Cymbalta to 60 mg daily We will check on how to best get your vitamin D level up knowing that you have had gastric bypass surgery If we do not get the arthralgias and myalgias improve may want to consider a visit to the rheumatologist.

## 2018-10-20 LAB — CBC WITH DIFFERENTIAL/PLATELET
Basophils Absolute: 0.1 10*3/uL (ref 0.0–0.2)
Basos: 1 %
EOS (ABSOLUTE): 0.2 10*3/uL (ref 0.0–0.4)
Eos: 2 %
Hematocrit: 33.4 % — ABNORMAL LOW (ref 34.0–46.6)
Hemoglobin: 10.7 g/dL — ABNORMAL LOW (ref 11.1–15.9)
Immature Grans (Abs): 0 10*3/uL (ref 0.0–0.1)
Immature Granulocytes: 0 %
Lymphocytes Absolute: 1.6 10*3/uL (ref 0.7–3.1)
Lymphs: 19 %
MCH: 24 pg — ABNORMAL LOW (ref 26.6–33.0)
MCHC: 32 g/dL (ref 31.5–35.7)
MCV: 75 fL — ABNORMAL LOW (ref 79–97)
Monocytes Absolute: 0.6 10*3/uL (ref 0.1–0.9)
Monocytes: 7 %
Neutrophils Absolute: 6 10*3/uL (ref 1.4–7.0)
Neutrophils: 71 %
Platelets: 291 10*3/uL (ref 150–450)
RBC: 4.46 x10E6/uL (ref 3.77–5.28)
RDW: 14.4 % (ref 12.3–15.4)
WBC: 8.4 10*3/uL (ref 3.4–10.8)

## 2018-10-20 LAB — VITAMIN B12: Vitamin B-12: 230 pg/mL — ABNORMAL LOW (ref 232–1245)

## 2018-10-20 LAB — SEDIMENTATION RATE: Sed Rate: 34 mm/hr (ref 0–40)

## 2018-10-20 LAB — VITAMIN D 25 HYDROXY (VIT D DEFICIENCY, FRACTURES): Vit D, 25-Hydroxy: 15.9 ng/mL — ABNORMAL LOW (ref 30.0–100.0)

## 2018-10-26 ENCOUNTER — Other Ambulatory Visit: Payer: Self-pay | Admitting: Family Medicine

## 2018-10-28 ENCOUNTER — Other Ambulatory Visit: Payer: Self-pay | Admitting: Family Medicine

## 2018-10-28 DIAGNOSIS — M130 Polyarthritis, unspecified: Secondary | ICD-10-CM

## 2018-10-28 NOTE — Telephone Encounter (Signed)
Please review

## 2018-10-29 ENCOUNTER — Telehealth: Payer: Self-pay | Admitting: *Deleted

## 2018-10-29 NOTE — Telephone Encounter (Signed)
Per pt she is taking Vit D 50,000 twice weekly

## 2018-10-29 NOTE — Telephone Encounter (Signed)
-----   Message from Chari ManningMichelle Bozovich, PharmD sent at 10/27/2018  5:26 PM EST ----- Did she take the 50,000 weekly of vitamin D like we discussed on my October visit.  I had asked her to take it 2-3 times a week and re-check her level.  I saw where it went up to 26 after our visit but then went back down.  My recommendation will depend on what she has been taking.   Thank you  Marcelino DusterMichelle ----- Message ----- From: Bearl Mulberryutherford, Vana Arif K Sent: 10/25/2018   5:31 PM EST To: Chari ManningMichelle Bozovich, PharmD  Dr Christell ConstantMoore would like to discuss Vit D and B12 deficiency in this pt who has had gastric bypass

## 2018-10-31 ENCOUNTER — Other Ambulatory Visit: Payer: Self-pay

## 2018-10-31 MED ORDER — LAMOTRIGINE 200 MG PO TABS: 200.0000 mg | ORAL_TABLET | Freq: Every day | ORAL | 1 refills | Status: DC

## 2018-11-01 DIAGNOSIS — Z1231 Encounter for screening mammogram for malignant neoplasm of breast: Secondary | ICD-10-CM | POA: Diagnosis not present

## 2018-11-01 LAB — HM MAMMOGRAPHY

## 2018-11-02 ENCOUNTER — Telehealth: Payer: Self-pay | Admitting: Family Medicine

## 2018-11-02 NOTE — Telephone Encounter (Signed)
Pt states she is having stabbing pains on the left side like "someone is stabbing my brain" and she has called neuro but they need another referral since it has been over 3 years. On pain scale pt rates the "stabbing pain at a 10" when it happens and lasts about 10 seconds and happens 10-11 times already today. Offered appt in after hours today but pt declined and accepted appt tomorrow at 1:35 with Kari BaarsMichelle Rakes.

## 2018-11-03 ENCOUNTER — Ambulatory Visit (INDEPENDENT_AMBULATORY_CARE_PROVIDER_SITE_OTHER): Payer: Medicare Other | Admitting: Family Medicine

## 2018-11-03 ENCOUNTER — Encounter: Payer: Self-pay | Admitting: Family Medicine

## 2018-11-03 VITALS — BP 125/82 | HR 86 | Temp 97.7°F | Ht 64.0 in | Wt 177.0 lb

## 2018-11-03 DIAGNOSIS — G43009 Migraine without aura, not intractable, without status migrainosus: Secondary | ICD-10-CM | POA: Diagnosis not present

## 2018-11-03 MED ORDER — RIZATRIPTAN BENZOATE 10 MG PO TABS: 10.0000 mg | ORAL_TABLET | ORAL | 5 refills | Status: DC | PRN

## 2018-11-03 NOTE — Progress Notes (Signed)
Subjective:    Patient ID: Erin Donaldson, female    DOB: 30-Nov-1950, 67 y.o.   MRN: [id number]  Chief Complaint:  Headache (head hurting all the time but also having a stabbing pain in left side of head x 1 week; called neurologist and she must have a referral since it has been more than three years since seeing them)   HPI: Erin Donaldson is a 67 y.o. female presenting on 11/03/2018 for Headache (head hurting all the time but also having a stabbing pain in left side of head x 1 week; called neurologist and she must have a referral since it has been more than three years since seeing them)   1. Migraine without aura and without status migrainosus, not intractable   Pt presents today with a migraine headache. States the headache started one week ago and has been waxing and waning. States the pain is located on the left side and behind her left eye. States it is throbbing and sharp in nature, 6/10. States she did not have an aura. States she has only tried [facility] powder for the pain with minimal relief. States she has not tried her Maxalt because she is out of it. She denies confusion, weakness, visual changes, syncope, dizziness, nausea, vomiting, or loss of function.    Relevant past medical, surgical, family, and social history reviewed and updated as indicated.  Allergies and medications reviewed and updated.   Past Medical History:  Diagnosis Date  . Anemia   . Anxiety   . Bipolar affective (HCC)   . Depression   . Diabetes in pregnancy   . Diabetes mellitus   . Elevated LFTs   . GERD (gastroesophageal reflux disease)   . Hyperlipidemia   . Hypertension   . IBS (irritable bowel syndrome)   . Migraine     Past Surgical History:  Procedure Laterality Date  . CHOLECYSTECTOMY    . GASTRIC BYPASS  2005    Social History   Socioeconomic History  . Marital status: Widowed    Spouse name: Not on file  . Number of children: 1  . Years of education: college  . Highest  education level: Not on file  Occupational History  . Occupation: Disabled    Comment: [employer]   Social Needs  . Financial resource strain: Not on file  . Food insecurity:    Worry: Not on file    Inability: Not on file  . Transportation needs:    Medical: Not on file    Non-medical: Not on file  Tobacco Use  . Smoking status: Never Smoker  . Smokeless tobacco: Never Used  Substance and Sexual Activity  . Alcohol use: No    Alcohol/week: 0.0 standard drinks    Comment: rare  . Drug use: No  . Sexual activity: Not on file  Lifestyle  . Physical activity:    Days per week: Not on file    Minutes per session: Not on file  . Stress: Not on file  Relationships  . Social connections:    Talks on phone: Not on file    Gets together: Not on file    Attends religious service: Not on file    Active member of club or organization: Not on file    Attends meetings of clubs or organizations: Not on file    Relationship status: Not on file  . Intimate partner violence:    Fear of current or ex partner: Not on file  Emotionally abused: Not on file    Physically abused: Not on file    Forced sexual activity: Not on file  Other Topics Concern  . Not on file  Social History Narrative   Lives with great grandson.      Outpatient Encounter Medications as of 11/03/2018  Medication Sig  . cyclobenzaprine (FLEXERIL) 10 MG tablet TAKE 1/2 TO 1 TAB TWICE A DAY AS DIRECTED  . donepezil (ARICEPT) 10 MG tablet TAKE 1 TABLET BY MOUTH EVERY DAY  . DULoxetine (CYMBALTA) 30 MG capsule TAKE 1 CAPSULE BY MOUTH EVERY DAY  . fluconazole (DIFLUCAN) 150 MG tablet TAKE 1 TABLET BY MOUTH EVERY DAY  . fluticasone (FLONASE) 50 MCG/ACT nasal spray Place 2 sprays into both nostrils daily.  . furosemide (LASIX) 20 MG tablet TAKE 1 TABLET BY MOUTH EVERY DAY  . gabapentin (NEURONTIN) 300 MG capsule TAKE 3 CAPSULE BY MOUTH THREE TIMES A DAY  . lamoTRIgine (LAMICTAL) 200 MG tablet Take 1 tablet (200 mg total) by  mouth daily.  . meclizine (ANTIVERT) 25 MG tablet Take 1 tablet (25 mg total) by mouth 3 (three) times daily as needed for dizziness.  . nystatin (MYCOSTATIN) 100000 UNIT/ML suspension TAKE 5 MLS (500,000 UNITS TOTAL) BY MOUTH 4 (FOUR) TIMES DAILY.  Marland Kitchen nystatin cream (MYCOSTATIN) APPLY 1 APPLICATION TOPICALLY 2 (TWO) TIMES DAILY.  . pantoprazole (PROTONIX) 40 MG tablet Take 2 tablets (80 mg total) by mouth daily.  . QUEtiapine (SEROQUEL) 200 MG tablet Take 1 tablet (200 mg total) by mouth daily.  . QUEtiapine (SEROQUEL) 400 MG tablet Take 2 tablets (800 mg total) by mouth at bedtime.  . rizatriptan (MAXALT) 10 MG tablet Take 1 tablet (10 mg total) by mouth as needed. May repeat in 2 hours if needed  . Vitamin D, Ergocalciferol, (DRISDOL) 1.25 MG (50000 UT) CAPS capsule TAKE 1 CAPSULE (50,000 UNITS TOTAL) BY MOUTH 2 (TWO) TIMES A WEEK.  . [DISCONTINUED] rizatriptan (MAXALT) 10 MG tablet Take 1 tablet (10 mg total) by mouth as needed. May repeat in 2 hours if needed  . [DISCONTINUED] DULoxetine (CYMBALTA) 30 MG capsule Take 2 capsules (60 mg total) by mouth daily.   No facility-administered encounter medications on file as of 11/03/2018.     Allergies  Allergen Reactions  . Bactrim [Sulfamethoxazole-Trimethoprim]     Raw and irritatated in mouth.  Leodis Liverpool [Propoxyphene N-Acetaminophen]   . Decongestant [Oxymetazoline]   . Elavil [Amitriptyline]   . Lithium Other (See Comments)    Hx toxicity  . Risperidone And Related   . Ultram [Tramadol]     Review of Systems  Constitutional: Negative for chills, fatigue and fever.  HENT: Negative for congestion, ear pain, sinus pressure, sinus pain and sore throat.   Eyes: Negative for photophobia and visual disturbance.  Respiratory: Negative for cough and shortness of breath.   Cardiovascular: Negative for chest pain, palpitations and leg swelling.  Gastrointestinal: Negative for abdominal pain, nausea and vomiting.  Neurological: Positive for  headaches. Negative for dizziness, tremors, seizures, syncope, facial asymmetry, speech difficulty, weakness, light-headedness and numbness.  Psychiatric/Behavioral: Negative for confusion and sleep disturbance.  All other systems reviewed and are negative.       Objective:    BP 125/82   Pulse 86   Temp 97.7 F (36.5 C) (Oral)   Ht 5\' 4"  (1.626 m)   Wt 177 lb (80.3 kg)   BMI 30.38 kg/m    Wt Readings from Last 3 Encounters:  11/03/18 177 lb (80.3  kg)  10/19/18 177 lb 9.6 oz (80.6 kg)  10/03/18 172 lb 6 oz (78.2 kg)    Physical Exam Vitals signs and nursing note reviewed.  Constitutional:      General: She is not in acute distress.    Appearance: Normal appearance. She is well-developed. She is not ill-appearing.  HENT:     Head: Normocephalic and atraumatic.     Jaw: There is normal jaw occlusion.     Right Ear: Hearing, tympanic membrane, ear canal and external ear normal.     Left Ear: Hearing, tympanic membrane, ear canal and external ear normal.     Nose: Nose normal.     Mouth/Throat:     Lips: Pink.     Mouth: Mucous membranes are moist.     Pharynx: Oropharynx is clear.  Eyes:     General: Lids are normal.     Extraocular Movements: Extraocular movements intact.     Conjunctiva/sclera: Conjunctivae normal.     Pupils: Pupils are equal, round, and reactive to light.     Visual Fields: Right eye visual fields normal and left eye visual fields normal.  Neck:     Musculoskeletal: Full passive range of motion without pain and neck supple.     Thyroid: No thyroid mass, thyromegaly or thyroid tenderness.     Vascular: No carotid bruit or JVD.     Trachea: Trachea and phonation normal.  Cardiovascular:     Rate and Rhythm: Normal rate and regular rhythm.     Heart sounds: No murmur. No friction rub. No gallop.   Pulmonary:     Effort: Pulmonary effort is normal. No respiratory distress.     Breath sounds: Normal breath sounds.  Abdominal:     General: Bowel  sounds are normal.     Palpations: Abdomen is soft.  Lymphadenopathy:     Cervical: No cervical adenopathy.  Skin:    General: Skin is warm and dry.     Capillary Refill: Capillary refill takes less than 2 seconds.  Neurological:     General: No focal deficit present.     Mental Status: She is alert and oriented to person, place, and time.     GCS: GCS eye subscore is 4. GCS verbal subscore is 5. GCS motor subscore is 6.     Cranial Nerves: Cranial nerves are intact.     Sensory: Sensation is intact.     Motor: Motor function is intact.     Coordination: Coordination is intact.     Gait: Gait is intact.     Deep Tendon Reflexes: Reflexes are normal and symmetric.  Psychiatric:        Attention and Perception: Attention and perception normal.        Mood and Affect: Mood and affect normal.        Behavior: Behavior normal. Behavior is cooperative.        Thought Content: Thought content normal.        Cognition and Memory: Cognition and memory normal.        Judgment: Judgment normal.     Results for orders placed or performed in visit on 10/19/18  CBC with Differential/Platelet  Result Value Ref Range   WBC 8.4 3.4 - 10.8 x10E3/uL   RBC 4.46 3.77 - 5.28 x10E6/uL   Hemoglobin 10.7 (L) 11.1 - 15.9 g/dL   Hematocrit 21.333.4 (L) 08.634.0 - 46.6 %   MCV 75 (L) 79 - 97 fL   MCH 24.0 (  L) 26.6 - 33.0 pg   MCHC 32.0 31.5 - 35.7 g/dL   RDW 82.914.4 56.212.3 - 13.015.4 %   Platelets 291 150 - 450 x10E3/uL   Neutrophils 71 Not Estab. %   Lymphs 19 Not Estab. %   Monocytes 7 Not Estab. %   Eos 2 Not Estab. %   Basos 1 Not Estab. %   Neutrophils Absolute 6.0 1.4 - 7.0 x10E3/uL   Lymphocytes Absolute 1.6 0.7 - 3.1 x10E3/uL   Monocytes Absolute 0.6 0.1 - 0.9 x10E3/uL   EOS (ABSOLUTE) 0.2 0.0 - 0.4 x10E3/uL   Basophils Absolute 0.1 0.0 - 0.2 x10E3/uL   Immature Granulocytes 0 Not Estab. %   Immature Grans (Abs) 0.0 0.0 - 0.1 x10E3/uL  Sedimentation Rate  Result Value Ref Range   Sed Rate 34 0 - 40  mm/hr  Vitamin B12  Result Value Ref Range   Vitamin B-12 230 (L) 232 - 1,245 pg/mL  VITAMIN D 25 Hydroxy (Vit-D Deficiency, Fractures)  Result Value Ref Range   Vit D, 25-Hydroxy 15.9 (L) 30.0 - 100.0 ng/mL       Pertinent labs & imaging results that were available during my care of the patient were reviewed by me and considered in my medical decision making.  Assessment & Plan:  Erin Donaldson was seen today for headache.  Diagnoses and all orders for this visit:  Migraine without aura and without status migrainosus, not intractable Avoid triggers. New referral to neurology. Medications as prescribed. Report any new or worsening symptoms.  -     rizatriptan (MAXALT) 10 MG tablet; Take 1 tablet (10 mg total) by mouth as needed. May repeat in 2 hours if needed -     Ambulatory referral to Neurology     Continue all other maintenance medications.  Follow up plan: Return in about 2 weeks (around 11/17/2018), or if symptoms worsen or fail to improve.  Educational handout given for migraine headache   The above assessment and management plan was discussed with the patient. The patient verbalized understanding of and has agreed to the management plan. Patient is aware to call the clinic if symptoms persist or worsen. Patient is aware when to return to the clinic for a follow-up visit. Patient educated on when it is appropriate to go to the emergency department.   Kari BaarsMichelle Rakes, FNP-C [facility] [phone]

## 2018-11-03 NOTE — Patient Instructions (Signed)

## 2018-11-07 ENCOUNTER — Ambulatory Visit (INDEPENDENT_AMBULATORY_CARE_PROVIDER_SITE_OTHER): Payer: Medicare Other | Admitting: Family

## 2018-11-07 ENCOUNTER — Encounter: Payer: Self-pay | Admitting: Family

## 2018-11-07 VITALS — BP 122/73 | HR 87 | Temp 97.7°F | Ht 64.0 in | Wt 174.0 lb

## 2018-11-07 DIAGNOSIS — J208 Acute bronchitis due to other specified organisms: Secondary | ICD-10-CM | POA: Diagnosis not present

## 2018-11-07 DIAGNOSIS — B9689 Other specified bacterial agents as the cause of diseases classified elsewhere: Secondary | ICD-10-CM | POA: Diagnosis not present

## 2018-11-07 MED ORDER — PREDNISONE 10 MG (21) PO TBPK: ORAL_TABLET | ORAL | 0 refills | Status: DC

## 2018-11-07 MED ORDER — DOXYCYCLINE HYCLATE 100 MG PO TABS: 100.0000 mg | ORAL_TABLET | Freq: Two times a day (BID) | ORAL | 0 refills | Status: DC

## 2018-11-07 NOTE — Patient Instructions (Signed)

## 2018-11-07 NOTE — Progress Notes (Signed)
Subjective:    Patient ID: Erin Donaldson, female    DOB: 1950-12-01, 67 y.o.   MRN: [id number]  Chief Complaint  Patient presents with  . Cough    with chest congestion    Cough  This is a new problem. The current episode started in the past 7 days. The problem has been gradually worsening. The problem occurs every few minutes. The cough is non-productive. Associated symptoms include ear pain (right), headaches ("I keep a headache") and shortness of breath. Pertinent negatives include no chills, ear congestion, fever, myalgias, nasal congestion, postnasal drip, rhinorrhea, sore throat ("scratchy") or wheezing. She has tried rest and OTC cough suppressant for the symptoms. The treatment provided mild relief.      Review of Systems  Constitutional: Negative for chills and fever.  HENT: Positive for ear pain (right). Negative for postnasal drip, rhinorrhea and sore throat ("scratchy").   Respiratory: Positive for cough and shortness of breath. Negative for wheezing.   Musculoskeletal: Negative for myalgias.  Neurological: Positive for headaches ("I keep a headache").  All other systems reviewed and are negative.      Objective:   Physical Exam Vitals signs reviewed.  Constitutional:      General: She is not in acute distress.    Appearance: She is well-developed.  HENT:     Head: Normocephalic and atraumatic.     Nose:     Right Turbinates: Enlarged.     Left Turbinates: Enlarged.     Mouth/Throat:     Pharynx: Posterior oropharyngeal erythema present.  Eyes:     Pupils: Pupils are equal, round, and reactive to light.  Neck:     Musculoskeletal: Normal range of motion and neck supple.     Thyroid: No thyromegaly.  Cardiovascular:     Rate and Rhythm: Normal rate and regular rhythm.     Heart sounds: Normal heart sounds. No murmur.  Pulmonary:     Effort: Pulmonary effort is normal. No respiratory distress.     Breath sounds: Examination of the right-lower field  reveals rales. Examination of the left-lower field reveals rales. Wheezing and rales present.  Abdominal:     General: Bowel sounds are normal. There is no distension.     Palpations: Abdomen is soft.     Tenderness: There is no abdominal tenderness.  Musculoskeletal: Normal range of motion.        General: No tenderness.  Skin:    General: Skin is warm and dry.  Neurological:     Mental Status: She is alert and oriented to person, place, and time.     Cranial Nerves: No cranial nerve deficit.     Deep Tendon Reflexes: Reflexes are normal and symmetric.  Psychiatric:        Behavior: Behavior normal.        Thought Content: Thought content normal.        Judgment: Judgment normal.       BP 122/73   Pulse 87   Temp 97.7 F (36.5 C)   Ht 5\' 4"  (1.626 m)   Wt 174 lb (78.9 kg)   BMI 29.87 kg/m      Assessment & Plan:  Erin Donaldson comes in today with chief complaint of Cough (with chest congestion)   Diagnosis and orders addressed:  1. Acute bacterial bronchitis - Take meds as prescribed - Use a cool mist humidifier  -Use saline nose sprays frequently -Force fluids -For any cough or congestion  Use plain  Mucinex- regular strength or max strength is fine -For fever or aces or pains- take tylenol or ibuprofen. -Throat lozenges if help -RTO if symptoms worsen or do not improve - doxycycline (VIBRA-TABS) 100 MG tablet; Take 1 tablet (100 mg total) by mouth 2 (two) times daily.  Dispense: 20 tablet; Refill: 0 - predniSONE (STERAPRED UNI-PAK 21 TAB) 10 MG (21) TBPK tablet; Use as directed  Dispense: 21 tablet; Refill: 0   Jannifer Rodneyhristy Tyse Auriemma, FNP

## 2018-11-11 ENCOUNTER — Telehealth: Payer: Self-pay

## 2018-11-11 NOTE — Telephone Encounter (Signed)
According to the [facility] Report the patient has a PHQ score of 11  The patient reports that her depression is associated with chronic pain.   Therefore, patient has declined [facility] services.

## 2018-11-11 NOTE — Telephone Encounter (Signed)
According to the [facility] Report the patient has a PHQ score of 11.  Patient reports that her depression is associated with her chronic pain.  Therefore, patient declined [facility] services.

## 2018-11-17 ENCOUNTER — Other Ambulatory Visit: Payer: Self-pay | Admitting: Family Medicine

## 2018-11-17 DIAGNOSIS — M549 Dorsalgia, unspecified: Secondary | ICD-10-CM

## 2018-11-21 ENCOUNTER — Telehealth: Payer: Self-pay | Admitting: Family Medicine

## 2018-11-21 NOTE — Telephone Encounter (Signed)
ntbs

## 2018-11-21 NOTE — Telephone Encounter (Signed)
PT is aware that she ntbs and pt states that she will wait till her apt with Dr Christell Constant.

## 2018-11-22 ENCOUNTER — Other Ambulatory Visit: Payer: Self-pay | Admitting: Family

## 2018-11-22 MED ORDER — NYSTATIN 100000 UNIT/GM EX CREA: TOPICAL_CREAM | CUTANEOUS | 2 refills | Status: DC

## 2018-11-23 ENCOUNTER — Ambulatory Visit (INDEPENDENT_AMBULATORY_CARE_PROVIDER_SITE_OTHER): Payer: Medicare Other | Admitting: Family Medicine

## 2018-11-23 ENCOUNTER — Encounter: Payer: Self-pay | Admitting: Family Medicine

## 2018-11-23 VITALS — BP 133/79 | HR 103 | Temp 97.5°F | Ht 64.0 in | Wt 173.0 lb

## 2018-11-23 DIAGNOSIS — R300 Dysuria: Secondary | ICD-10-CM | POA: Diagnosis not present

## 2018-11-23 DIAGNOSIS — F339 Major depressive disorder, recurrent, unspecified: Secondary | ICD-10-CM

## 2018-11-23 LAB — URINALYSIS, COMPLETE
Bilirubin, UA: NEGATIVE
Glucose, UA: NEGATIVE
Ketones, UA: NEGATIVE
Nitrite, UA: NEGATIVE
Specific Gravity, UA: >1.03 — ABNORMAL HIGH (ref 1.005–1.030)
Urobilinogen, Ur: 0.2 mg/dL (ref 0.2–1.0)
pH, UA: 5.5 (ref 5.0–7.5)

## 2018-11-23 LAB — MICROSCOPIC EXAMINATION
RBC, UA: NONE SEEN /hpf (ref 0–2)
Renal Epithel, UA: NONE SEEN /hpf

## 2018-11-23 NOTE — Progress Notes (Signed)
Subjective:    Patient ID: Erin Donaldson, female    DOB: 06-08-1951, 68 y.o.   MRN: [id number]  HPI Patient here today for 4 week follow up on depression, fatigue and myalgias.  Patient continues to have a worsening depression than previously.  Her myalgias are better.  She continues to have fatigue.  She is also having symptoms which could indicate a urinary tract infection with pressure and burning and will get a urinalysis today.  She is currently taking Cymbalta Lamictal Seroquel.  The patient does have some burning and pressure in the suprapubic area and we will get a urine and a culture and sensitivity today.  The urine report came back with a few WBCs but we will wait until the culture and sensitivity report is returned to make a decision about any treatment.  Basically the whole visit was concerned with a discussion about her increasing responsibilities and one is that she has adopted her great grandchild who is 66 years old currently.  She has grand children issues.  She also has become engaged to a 66 year old man who has morbid obesity and is becoming more dependent upon her and she realizes that she cannot provide the services that he needs to have provided to look after him.  I think after a long conversation about being overwhelmed that she will consider removing her herself from the situation once he is able to get money to pay for full-time care at home.  She has to arrive at that decision on her own and do it in a loving way for her to be satisfied will to her.  She says she just needed to hear this from someone else.  There will be no changes in her medicine based on the long discussion that we had regarding her extra responsibilities that she knows that she cannot do.  She does complain of suprapubic pressure and we will check the urine as noted above.   Patient Active Problem List   Diagnosis Date Noted  . Vitamin D deficiency 10/03/2018  . Overweight (BMI 25.0-29.9) 04/26/2018  .  Senile purpura (HCC) 04/04/2018  . Abnormal EKG 12/29/2017  . Iron deficiency anemia 01/24/2016  . Swelling of right lower extremity 07/15/2015  . Right hip pain 07/15/2015  . Microcytic anemia 07/15/2015  . Elevated LFTs   . History of gastric bypass 01/15/2014  . Amnestic disorder due to medical condition (HCC) 08/23/2013  . Diarrhea 07/19/2013  . Bipolar affective disorder in remission (HCC) 08/26/2009  . ANXIETY 08/26/2009  . Migraine headache 08/26/2009  . Type 2 diabetes mellitus with hyperlipidemia (HCC) 08/15/2009  . Hyperlipidemia 08/15/2009  . Essential hypertension 08/15/2009  . GERD 08/15/2009  . CONSTIPATION 08/15/2009  . IRRITABLE BOWEL SYNDROME 08/15/2009   Outpatient Encounter Medications as of 11/23/2018  Medication Sig  . cyclobenzaprine (FLEXERIL) 10 MG tablet TAKE 1/2 TO 1 TAB TWICE A DAY AS DIRECTED  . donepezil (ARICEPT) 10 MG tablet TAKE 1 TABLET BY MOUTH EVERY DAY  . DULoxetine (CYMBALTA) 30 MG capsule TAKE 1 CAPSULE BY MOUTH EVERY DAY  . fluconazole (DIFLUCAN) 150 MG tablet TAKE 1 TABLET BY MOUTH EVERY DAY  . fluticasone (FLONASE) 50 MCG/ACT nasal spray Place 2 sprays into both nostrils daily.  . furosemide (LASIX) 20 MG tablet TAKE 1 TABLET BY MOUTH EVERY DAY  . gabapentin (NEURONTIN) 300 MG capsule TAKE 3 CAPSULE BY MOUTH THREE TIMES A DAY  . lamoTRIgine (LAMICTAL) 200 MG tablet Take 1 tablet (200 mg total)  by mouth daily.  . meclizine (ANTIVERT) 25 MG tablet Take 1 tablet (25 mg total) by mouth 3 (three) times daily as needed for dizziness.  . nystatin (MYCOSTATIN) 100000 UNIT/ML suspension TAKE 5 MLS (500,000 UNITS TOTAL) BY MOUTH 4 (FOUR) TIMES DAILY.  Marland Kitchen. nystatin cream (MYCOSTATIN) APPLY 1 APPLICATION TOPICALLY 2 (TWO) TIMES DAILY.  . pantoprazole (PROTONIX) 40 MG tablet Take 2 tablets (80 mg total) by mouth daily.  . QUEtiapine (SEROQUEL) 200 MG tablet Take 1 tablet (200 mg total) by mouth daily.  . QUEtiapine (SEROQUEL) 400 MG tablet Take 2 tablets  (800 mg total) by mouth at bedtime.  . rizatriptan (MAXALT) 10 MG tablet Take 1 tablet (10 mg total) by mouth as needed. May repeat in 2 hours if needed  . Vitamin D, Ergocalciferol, (DRISDOL) 1.25 MG (50000 UT) CAPS capsule TAKE 1 CAPSULE (50,000 UNITS TOTAL) BY MOUTH 2 (TWO) TIMES A WEEK.  . [DISCONTINUED] doxycycline (VIBRA-TABS) 100 MG tablet Take 1 tablet (100 mg total) by mouth 2 (two) times daily.  . [DISCONTINUED] predniSONE (STERAPRED UNI-PAK 21 TAB) 10 MG (21) TBPK tablet Use as directed   No facility-administered encounter medications on file as of 11/23/2018.       Review of Systems  Constitutional: Positive for fatigue.  HENT: Negative.   Eyes: Negative.   Respiratory: Negative.   Cardiovascular: Negative.   Gastrointestinal: Positive for abdominal pain (lower pressure ).  Endocrine: Negative.   Genitourinary: Positive for dysuria.  Musculoskeletal: Positive for myalgias.  Skin: Negative.   Allergic/Immunologic: Negative.   Neurological: Negative.   Hematological: Negative.   Psychiatric/Behavioral: Negative.        Depression        Objective:   Physical Exam Vitals signs and nursing note reviewed.  Constitutional:      Appearance: Normal appearance. She is well-developed. She is obese. She is not ill-appearing.  HENT:     Head: Normocephalic and atraumatic.     Right Ear: External ear normal.     Left Ear: External ear normal.     Nose: Nose normal.  Eyes:     General: No scleral icterus.       Right eye: No discharge.        Left eye: No discharge.     Conjunctiva/sclera: Conjunctivae normal.     Pupils: Pupils are equal, round, and reactive to light.  Neck:     Musculoskeletal: Normal range of motion.     Thyroid: No thyromegaly.     Vascular: No JVD.  Abdominal:     General: Bowel sounds are normal.     Palpations: Abdomen is soft. There is no mass.     Tenderness: There is abdominal tenderness. There is no guarding.     Comments: Abdominal obesity  with generalized abdominal tenderness no organ enlargement or bruits or masses.  There was suprapubic tenderness.  Musculoskeletal: Normal range of motion.  Skin:    General: Skin is warm.     Findings: No rash.  Neurological:     General: No focal deficit present.     Mental Status: She is alert and oriented to person, place, and time.     Deep Tendon Reflexes: Reflexes are normal and symmetric.  Psychiatric:        Mood and Affect: Mood normal.        Behavior: Behavior normal.        Thought Content: Thought content normal.        Judgment: Judgment normal.  Comments: Mood affect and behavior for this patient were normal although she is just needing to be heard out about the extra responsibilities that she is assumed that she knows she does not need to assume.  She will need to make decisions regarding those responsibilities and trying to take good care of herself and her great grandchild that she has adopted.     BP 133/79 (BP Location: Left Arm)   Pulse (!) 103   Temp (!) 97.5 F (36.4 C) (Oral)   Ht 5\' 4"  (1.626 m)   Wt 173 lb (78.5 kg)   BMI 29.70 kg/m        Assessment & Plan:  1. Dysuria -Drink more fluids and stay well-hydrated - Urine Culture - Urinalysis, Complete  2. Depression, recurrent (HCC) -Continue to follow-up with Dr. Jennelle Human, continue current medication of Cymbalta and Lamictal as per his direction -Make important decisions to remove reasons for the worsening depression by removing self from the situation.  Patient understands that this would be the way that she needs to go to feel better instead of taking more medicine.  Patient Instructions  Patient is encouraged to drink more water and fluids and stay well-hydrated She is encouraged to pray about the situation with her friend and to remove herself from that situation if she thinks that this is what she has to do in order to keep her on sanity and take care of her great grandchild as she has assume  that responsibility. He should continue with her current medications She should continue to drink plenty of fluids and stay well-hydrated.  Nyra Capes MD

## 2018-11-23 NOTE — Patient Instructions (Signed)
Patient is encouraged to drink more water and fluids and stay well-hydrated She is encouraged to pray about the situation with her friend and to remove herself from that situation if she thinks that this is what she has to do in order to keep her on sanity and take care of her great grandchild as she has assume that responsibility. He should continue with her current medications She should continue to drink plenty of fluids and stay well-hydrated.

## 2018-11-25 ENCOUNTER — Other Ambulatory Visit: Payer: Self-pay | Admitting: *Deleted

## 2018-11-25 LAB — URINE CULTURE

## 2018-11-25 MED ORDER — CEFDINIR 300 MG PO CAPS: 300.0000 mg | ORAL_CAPSULE | Freq: Two times a day (BID) | ORAL | 0 refills | Status: DC

## 2018-11-26 ENCOUNTER — Other Ambulatory Visit: Payer: Self-pay | Admitting: Family Medicine

## 2018-11-26 DIAGNOSIS — K219 Gastro-esophageal reflux disease without esophagitis: Secondary | ICD-10-CM

## 2018-11-27 ENCOUNTER — Other Ambulatory Visit: Payer: Self-pay | Admitting: Family Medicine

## 2018-11-29 ENCOUNTER — Other Ambulatory Visit: Payer: Self-pay | Admitting: Psychiatry

## 2018-12-08 ENCOUNTER — Other Ambulatory Visit: Payer: Self-pay | Admitting: *Deleted

## 2018-12-08 ENCOUNTER — Other Ambulatory Visit: Payer: Self-pay | Admitting: Family Medicine

## 2018-12-08 MED ORDER — FLUCONAZOLE 150 MG PO TABS: 150.0000 mg | ORAL_TABLET | Freq: Every day | ORAL | 0 refills | Status: DC

## 2018-12-08 NOTE — Telephone Encounter (Signed)
VM from patient Was on Antibiotic last week, requesting refill on Diflucan now has yeast infection

## 2018-12-15 DIAGNOSIS — H5203 Hypermetropia, bilateral: Secondary | ICD-10-CM | POA: Diagnosis not present

## 2018-12-16 ENCOUNTER — Ambulatory Visit: Payer: Self-pay | Admitting: Physician Assistant

## 2018-12-21 ENCOUNTER — Encounter: Payer: Self-pay | Admitting: Family Medicine

## 2018-12-21 ENCOUNTER — Ambulatory Visit (INDEPENDENT_AMBULATORY_CARE_PROVIDER_SITE_OTHER): Payer: Medicare Other | Admitting: Family Medicine

## 2018-12-21 VITALS — BP 123/74 | HR 90 | Temp 97.9°F | Ht 64.0 in | Wt 169.0 lb

## 2018-12-21 DIAGNOSIS — K219 Gastro-esophageal reflux disease without esophagitis: Secondary | ICD-10-CM | POA: Diagnosis not present

## 2018-12-21 DIAGNOSIS — E559 Vitamin D deficiency, unspecified: Secondary | ICD-10-CM | POA: Diagnosis not present

## 2018-12-21 DIAGNOSIS — Z87440 Personal history of urinary (tract) infections: Secondary | ICD-10-CM

## 2018-12-21 DIAGNOSIS — D509 Iron deficiency anemia, unspecified: Secondary | ICD-10-CM

## 2018-12-21 DIAGNOSIS — E1169 Type 2 diabetes mellitus with other specified complication: Secondary | ICD-10-CM

## 2018-12-21 DIAGNOSIS — I10 Essential (primary) hypertension: Secondary | ICD-10-CM | POA: Diagnosis not present

## 2018-12-21 DIAGNOSIS — E785 Hyperlipidemia, unspecified: Secondary | ICD-10-CM

## 2018-12-21 NOTE — Addendum Note (Signed)
Addended by: Magdalene River on: 12/21/2018 03:52 PM   Modules accepted: Orders

## 2018-12-21 NOTE — Progress Notes (Signed)
Subjective:    Patient ID: [name], female    DOB: 06/16/1951, 68 y.o.   MRN: [id number]  HPI Pt here for follow up and management of chronic medical problems which includes diabetes, hypertension and hyperlipidemia. She is taking medication regularly.  The patient comes in today for routine follow-up.  At the last visit she described an overwhelming amount of stress in her life including a friend and her parenting her grandchild.  Today she has no specific complaints and does not need any refills.  She is given an FOBT to return and an order was placed for future lab work.  Her vital signs are stable her weight is down 4 pounds which is good and her blood pressure is good.  Patient is definitely doing better today she ended a relationship with a very sick and needy patient.  She has since established a relationship with a person who is not is needy and seems to be someone that she can enjoy going out and doing things with and I applauded her for being careful in making these choices.    Patient Active Problem List   Diagnosis Date Noted  . Vitamin D deficiency 10/03/2018  . Overweight (BMI 25.0-29.9) 04/26/2018  . Senile purpura ([place]) 04/04/2018  . Abnormal EKG 12/29/2017  . Iron deficiency anemia 01/24/2016  . Swelling of right lower extremity 07/15/2015  . Right hip pain 07/15/2015  . Microcytic anemia 07/15/2015  . Elevated LFTs   . History of gastric bypass 01/15/2014  . Amnestic disorder due to medical condition ([place]) 08/23/2013  . Diarrhea 07/19/2013  . Bipolar affective disorder in remission ([place]) 08/26/2009  . ANXIETY 08/26/2009  . Migraine headache 08/26/2009  . Type 2 diabetes mellitus with hyperlipidemia ([place]) 08/15/2009  . Hyperlipidemia 08/15/2009  . Essential hypertension 08/15/2009  . GERD 08/15/2009  . CONSTIPATION 08/15/2009  . IRRITABLE BOWEL SYNDROME 08/15/2009   Outpatient Encounter Medications as of 12/21/2018  Medication Sig  . cyclobenzaprine  (FLEXERIL) 10 MG tablet TAKE 1/2 TO 1 TAB TWICE A DAY AS DIRECTED  . donepezil (ARICEPT) 10 MG tablet TAKE 1 TABLET BY MOUTH EVERY DAY  . DULoxetine (CYMBALTA) 30 MG capsule TAKE 1 CAPSULE BY MOUTH EVERY DAY  . fluticasone (FLONASE) 50 MCG/ACT nasal spray Place 2 sprays into both nostrils daily.  . furosemide (LASIX) 20 MG tablet TAKE 1 TABLET BY MOUTH EVERY DAY  . gabapentin (NEURONTIN) 300 MG capsule TAKE 3 CAPSULE BY MOUTH THREE TIMES A DAY  . lamoTRIgine (LAMICTAL) 200 MG tablet Take 1 tablet (200 mg total) by mouth daily.  . meclizine (ANTIVERT) 25 MG tablet Take 1 tablet (25 mg total) by mouth 3 (three) times daily as needed for dizziness.  . nystatin (MYCOSTATIN) 100000 UNIT/ML suspension TAKE 5 MLS (500,000 UNITS TOTAL) BY MOUTH 4 (FOUR) TIMES DAILY.  Marland Kitchen nystatin cream (MYCOSTATIN) APPLY 1 APPLICATION TOPICALLY 2 (TWO) TIMES DAILY.  . pantoprazole (PROTONIX) 40 MG tablet TAKE 2 TABLETS (80 MG TOTAL) BY MOUTH DAILY.  Marland Kitchen QUEtiapine (SEROQUEL) 200 MG tablet Take 1 tablet (200 mg total) by mouth daily.  . QUEtiapine (SEROQUEL) 400 MG tablet TAKE 2 TABLETS (800 MG TOTAL) BY MOUTH AT BEDTIME.  . rizatriptan (MAXALT) 10 MG tablet Take 1 tablet (10 mg total) by mouth as needed. May repeat in 2 hours if needed  . Vitamin D, Ergocalciferol, (DRISDOL) 1.25 MG (50000 UT) CAPS capsule TAKE 1 CAPSULE (50,000 UNITS TOTAL) BY MOUTH 2 (TWO) TIMES A WEEK.  . [DISCONTINUED] cefdinir (  OMNICEF) 300 MG capsule Take 1 capsule (300 mg total) by mouth 2 (two) times daily. 1 po BID  . [DISCONTINUED] fluconazole (DIFLUCAN) 150 MG tablet Take 1 tablet (150 mg total) by mouth daily.   No facility-administered encounter medications on file as of 12/21/2018.      Review of Systems  Constitutional: Negative.   HENT: Negative.   Eyes: Negative.   Respiratory: Negative.   Cardiovascular: Negative.   Gastrointestinal: Negative.   Endocrine: Negative.   Genitourinary: Negative.   Musculoskeletal: Negative.   Skin:  Negative.   Allergic/Immunologic: Negative.   Neurological: Negative.   Hematological: Negative.   Psychiatric/Behavioral: Negative.        Objective:   Physical Exam Vitals signs and nursing note reviewed.  Constitutional:      Appearance: Normal appearance. She is well-developed. She is not ill-appearing or diaphoretic.     Comments: Patient is pleasant and seems to be calmer than she was at the last visit.  HENT:     Head: Normocephalic and atraumatic.     Right Ear: Tympanic membrane, ear canal and external ear normal. There is no impacted cerumen.     Left Ear: Tympanic membrane, ear canal and external ear normal. There is no impacted cerumen.     Nose: Nose normal. No congestion.     Mouth/Throat:     Mouth: Mucous membranes are moist.     Pharynx: Oropharynx is clear. No oropharyngeal exudate or posterior oropharyngeal erythema.  Eyes:     General: No scleral icterus.       Right eye: No discharge.        Left eye: No discharge.     Extraocular Movements: Extraocular movements intact.     Conjunctiva/sclera: Conjunctivae normal.     Pupils: Pupils are equal, round, and reactive to light.  Neck:     Musculoskeletal: Normal range of motion and neck supple. No muscular tenderness.     Thyroid: No thyromegaly.     Vascular: No carotid bruit or JVD.     Comments: No bruits thyromegaly or anterior cervical adenopathy Cardiovascular:     Rate and Rhythm: Normal rate and regular rhythm.     Heart sounds: Normal heart sounds. No murmur. No gallop.      Comments: The heart was regular at 72/min with no edema.  Extremities were warm and pulses were somewhat weak but present bilaterally. Pulmonary:     Effort: Pulmonary effort is normal.     Breath sounds: Normal breath sounds. No wheezing or rales.     Comments: Clear anteriorly and posteriorly Abdominal:     General: Abdomen is flat. Bowel sounds are normal.     Palpations: Abdomen is soft. There is no mass.     Tenderness:  There is no abdominal tenderness.  Musculoskeletal: Normal range of motion.        General: No tenderness.     Right lower leg: No edema.     Left lower leg: No edema.  Lymphadenopathy:     Cervical: No cervical adenopathy.  Skin:    General: Skin is warm and dry.     Findings: No rash.  Neurological:     General: No focal deficit present.     Mental Status: She is alert and oriented to person, place, and time. Mental status is at baseline.     Cranial Nerves: No cranial nerve deficit.     Sensory: No sensory deficit.     Deep Tendon Reflexes:  Reflexes are normal and symmetric. Reflexes normal.  Psychiatric:        Mood and Affect: Mood normal.        Behavior: Behavior normal.        Thought Content: Thought content normal.        Judgment: Judgment normal.     Comments: Patient's mood affect and behavior were definitely improved today compared to previously.  She is made some changes in her life which would make her life easier and less complicated and I applauded her for that.    BP 123/74 (BP Location: Left Arm)   Pulse 90   Temp 97.9 F (36.6 C) (Oral)   Ht '5\' 4"'  (1.626 m)   Wt 169 lb (76.7 kg)   BMI 29.01 kg/m         Assessment & Plan:  1. Vitamin D deficiency -Continue current treatment pending results of lab work - VITAMIN D 25 Hydroxy (Vit-D Deficiency, Fractures); Future  2. Type 2 diabetes mellitus with hyperlipidemia ([place]) -Continue current treatment pending results of lab work - [facility] [place] Hb A1c Waived; Future  3. Iron deficiency anemia, unspecified iron deficiency anemia type - CBC with Differential/Platelet; Future  4. Gastroesophageal reflux disease, esophagitis presence not specified -No complaints today with reflux. - Lipid panel; Future - Hepatic function panel; Future  5. Essential hypertension -Blood pressure is good she will continue with current treatment - BMP8+EGFR; Future - Hepatic function panel; Future  6. Hyperlipidemia,  unspecified hyperlipidemia type - Lipid panel; Future  Patient Instructions                       Medicare Annual Wellness Visit  [facility] and the medical providers at [facility] strive to bring you the best medical care.  In doing so we not only want to address your current medical conditions and concerns but also to detect new conditions early and prevent illness, disease and health-related problems.    Medicare offers a yearly Wellness Visit which allows our clinical staff to assess your need for preventative services including immunizations, lifestyle education, counseling to decrease risk of preventable diseases and screening for fall risk and other medical concerns.    This visit is provided free of charge (no copay) for all Medicare recipients. The clinical pharmacists at [facility] have begun to conduct these Wellness Visits which will also include a thorough review of all your medications.    As you primary medical provider recommend that you make an appointment for your Annual Wellness Visit if you have not done so already this year.  You may set up this appointment before you leave today or you may call back ([phone]) and schedule an appointment.  Please make sure when you call that you mention that you are scheduling your Annual Wellness Visit with the clinical pharmacist so that the appointment may be made for the proper length of time.     Continue current medications. Continue good therapeutic lifestyle changes which include good diet and exercise. Fall precautions discussed with patient. If an FOBT was given today- please return it to our front desk. If you are over 35 years old - you may need Prevnar 33 or the adult Pneumonia vaccine.  **Flu shots are available--- please call and schedule a FLU-CLINIC appointment**  After your visit with [place] today you will receive a survey in the mail or online from [employer] regarding your  care with [place].  Please take a moment to fill this out. Your feedback is very important to [place] as you can help [place] better understand your patient needs as well as improve your experience and satisfaction. WE CARE ABOUT YOU!!!   Continue to drink plenty of water and fluids Continue to work on weight through diet and exercise We will call with lab work results and urinalysis results as soon as these tests get reported Take better care of yourself!  Arrie Senate MD

## 2018-12-21 NOTE — Patient Instructions (Addendum)
Medicare Annual Wellness Visit  [facility] and the medical providers at [facility] strive to bring you the best medical care.  In doing so we not only want to address your current medical conditions and concerns but also to detect new conditions early and prevent illness, disease and health-related problems.    Medicare offers a yearly Wellness Visit which allows our clinical staff to assess your need for preventative services including immunizations, lifestyle education, counseling to decrease risk of preventable diseases and screening for fall risk and other medical concerns.    This visit is provided free of charge (no copay) for all Medicare recipients. The clinical pharmacists at [facility] have begun to conduct these Wellness Visits which will also include a thorough review of all your medications.    As you primary medical provider recommend that you make an appointment for your Annual Wellness Visit if you have not done so already this year.  You may set up this appointment before you leave today or you may call back ([phone]) and schedule an appointment.  Please make sure when you call that you mention that you are scheduling your Annual Wellness Visit with the clinical pharmacist so that the appointment may be made for the proper length of time.     Continue current medications. Continue good therapeutic lifestyle changes which include good diet and exercise. Fall precautions discussed with patient. If an FOBT was given today- please return it to our front desk. If you are over 24 years old - you may need Prevnar 13 or the adult Pneumonia vaccine.  **Flu shots are available--- please call and schedule a FLU-CLINIC appointment**  After your visit with [place] today you will receive a survey in the mail or online from [employer] regarding your care with [place]. Please take a moment to fill this out. Your feedback is very  important to [place] as you can help [place] better understand your patient needs as well as improve your experience and satisfaction. WE CARE ABOUT YOU!!!   Continue to drink plenty of water and fluids Continue to work on weight through diet and exercise We will call with lab work results and urinalysis results as soon as these tests get reported Take better care of yourself!

## 2018-12-22 ENCOUNTER — Ambulatory Visit: Payer: Self-pay | Admitting: Family Medicine

## 2018-12-23 ENCOUNTER — Other Ambulatory Visit: Payer: Self-pay

## 2018-12-23 DIAGNOSIS — Z87440 Personal history of urinary (tract) infections: Secondary | ICD-10-CM | POA: Diagnosis not present

## 2018-12-23 LAB — URINALYSIS, COMPLETE
Bilirubin, UA: NEGATIVE
Glucose, UA: NEGATIVE
Ketones, UA: NEGATIVE
Nitrite, UA: POSITIVE — AB
Protein, UA: NEGATIVE
Specific Gravity, UA: >1.03 — ABNORMAL HIGH (ref 1.005–1.030)
Urobilinogen, Ur: 0.2 mg/dL (ref 0.2–1.0)
pH, UA: 6 (ref 5.0–7.5)

## 2018-12-23 LAB — MICROSCOPIC EXAMINATION: Renal Epithel, UA: NONE SEEN /hpf

## 2018-12-25 LAB — URINE CULTURE

## 2018-12-26 ENCOUNTER — Other Ambulatory Visit: Payer: Self-pay | Admitting: Psychiatry

## 2018-12-26 ENCOUNTER — Other Ambulatory Visit: Payer: Self-pay | Admitting: *Deleted

## 2018-12-26 MED ORDER — CEFDINIR 300 MG PO CAPS: 300.0000 mg | ORAL_CAPSULE | Freq: Two times a day (BID) | ORAL | 0 refills | Status: DC

## 2019-01-06 ENCOUNTER — Telehealth: Payer: Self-pay | Admitting: Family Medicine

## 2019-01-06 ENCOUNTER — Other Ambulatory Visit: Payer: Self-pay | Admitting: *Deleted

## 2019-01-06 DIAGNOSIS — Z87440 Personal history of urinary (tract) infections: Secondary | ICD-10-CM

## 2019-01-06 MED ORDER — FLUCONAZOLE 150 MG PO TABS: 150.0000 mg | ORAL_TABLET | Freq: Every day | ORAL | 2 refills | Status: DC

## 2019-01-06 NOTE — Telephone Encounter (Signed)
What is the name of the medication? Needs Diflucan #3 Just finished ABX  Have you contacted your pharmacy to request a refill? NO  [facility]   Patient notified that their request is being sent to the clinical staff for review and that they should receive a call once it is complete. If they do not receive a call within 24 hours they can check with their pharmacy or our office.

## 2019-01-06 NOTE — Telephone Encounter (Signed)
Pt aware and med sent in  

## 2019-01-06 NOTE — Telephone Encounter (Signed)
Please refill previous Diflucan prescription and take as directed

## 2019-01-10 ENCOUNTER — Other Ambulatory Visit: Payer: Medicare Other

## 2019-01-10 DIAGNOSIS — D509 Iron deficiency anemia, unspecified: Secondary | ICD-10-CM

## 2019-01-10 DIAGNOSIS — E559 Vitamin D deficiency, unspecified: Secondary | ICD-10-CM

## 2019-01-10 DIAGNOSIS — E1169 Type 2 diabetes mellitus with other specified complication: Secondary | ICD-10-CM | POA: Diagnosis not present

## 2019-01-10 DIAGNOSIS — E785 Hyperlipidemia, unspecified: Secondary | ICD-10-CM | POA: Diagnosis not present

## 2019-01-10 DIAGNOSIS — N390 Urinary tract infection, site not specified: Secondary | ICD-10-CM | POA: Diagnosis not present

## 2019-01-10 DIAGNOSIS — I10 Essential (primary) hypertension: Secondary | ICD-10-CM | POA: Diagnosis not present

## 2019-01-10 DIAGNOSIS — Z87440 Personal history of urinary (tract) infections: Secondary | ICD-10-CM | POA: Diagnosis not present

## 2019-01-10 DIAGNOSIS — K219 Gastro-esophageal reflux disease without esophagitis: Secondary | ICD-10-CM | POA: Diagnosis not present

## 2019-01-10 LAB — URINALYSIS, COMPLETE
Bilirubin, UA: NEGATIVE
Glucose, UA: NEGATIVE
Ketones, UA: NEGATIVE
Nitrite, UA: NEGATIVE
Protein, UA: NEGATIVE
RBC, UA: NEGATIVE
Specific Gravity, UA: 1.01 (ref 1.005–1.030)
Urobilinogen, Ur: 0.2 mg/dL (ref 0.2–1.0)
pH, UA: 6 (ref 5.0–7.5)

## 2019-01-10 LAB — MICROSCOPIC EXAMINATION: Renal Epithel, UA: NONE SEEN /hpf

## 2019-01-10 LAB — BAYER DCA HB A1C WAIVED: HB A1C (BAYER DCA - WAIVED): 7 % — ABNORMAL HIGH (ref <7.0)

## 2019-01-11 ENCOUNTER — Telehealth: Payer: Self-pay | Admitting: Family Medicine

## 2019-01-11 LAB — LIPID PANEL
Chol/HDL Ratio: 2.5 ratio (ref 0.0–4.4)
Cholesterol, Total: 160 mg/dL (ref 100–199)
HDL: 63 mg/dL (ref >39)
LDL Calculated: 83 mg/dL (ref 0–99)
Triglycerides: 68 mg/dL (ref 0–149)
VLDL Cholesterol Cal: 14 mg/dL (ref 5–40)

## 2019-01-11 LAB — CBC WITH DIFFERENTIAL/PLATELET
Basophils Absolute: 0.1 10*3/uL (ref 0.0–0.2)
Basos: 1 %
EOS (ABSOLUTE): 0.2 10*3/uL (ref 0.0–0.4)
Eos: 2 %
Hematocrit: 32.7 % — ABNORMAL LOW (ref 34.0–46.6)
Hemoglobin: 10.7 g/dL — ABNORMAL LOW (ref 11.1–15.9)
Immature Grans (Abs): 0 10*3/uL (ref 0.0–0.1)
Immature Granulocytes: 0 %
Lymphocytes Absolute: 1.4 10*3/uL (ref 0.7–3.1)
Lymphs: 19 %
MCH: 25.1 pg — ABNORMAL LOW (ref 26.6–33.0)
MCHC: 32.7 g/dL (ref 31.5–35.7)
MCV: 77 fL — ABNORMAL LOW (ref 79–97)
Monocytes Absolute: 0.5 10*3/uL (ref 0.1–0.9)
Monocytes: 7 %
Neutrophils Absolute: 5.4 10*3/uL (ref 1.4–7.0)
Neutrophils: 71 %
Platelets: 266 10*3/uL (ref 150–450)
RBC: 4.26 x10E6/uL (ref 3.77–5.28)
RDW: 16.2 % — ABNORMAL HIGH (ref 11.7–15.4)
WBC: 7.5 10*3/uL (ref 3.4–10.8)

## 2019-01-11 LAB — HEPATIC FUNCTION PANEL
ALT: 11 IU/L (ref 0–32)
AST: 6 IU/L (ref 0–40)
Albumin: 4 g/dL (ref 3.8–4.8)
Alkaline Phosphatase: 269 IU/L — ABNORMAL HIGH (ref 39–117)
Bilirubin Total: <0.2 mg/dL (ref 0.0–1.2)
Bilirubin, Direct: 0.06 mg/dL (ref 0.00–0.40)
Total Protein: 6.4 g/dL (ref 6.0–8.5)

## 2019-01-11 LAB — BMP8+EGFR
BUN/Creatinine Ratio: 11 — ABNORMAL LOW (ref 12–28)
BUN: 11 mg/dL (ref 8–27)
CO2: 21 mmol/L (ref 20–29)
Calcium: 9 mg/dL (ref 8.7–10.3)
Chloride: 106 mmol/L (ref 96–106)
Creatinine, Ser: 1.04 mg/dL — ABNORMAL HIGH (ref 0.57–1.00)
GFR calc Af Amer: 65 mL/min/{1.73_m2} (ref >59)
GFR calc non Af Amer: 57 mL/min/{1.73_m2} — ABNORMAL LOW (ref >59)
Glucose: 141 mg/dL — ABNORMAL HIGH (ref 65–99)
Potassium: 4.7 mmol/L (ref 3.5–5.2)
Sodium: 138 mmol/L (ref 134–144)

## 2019-01-11 LAB — VITAMIN D 25 HYDROXY (VIT D DEFICIENCY, FRACTURES): Vit D, 25-Hydroxy: 24.2 ng/mL — ABNORMAL LOW (ref 30.0–100.0)

## 2019-01-12 LAB — URINE CULTURE

## 2019-01-12 NOTE — Telephone Encounter (Signed)
Reviewed all results and recommendations and pt voiced understanding.

## 2019-01-13 ENCOUNTER — Other Ambulatory Visit: Payer: Self-pay | Admitting: *Deleted

## 2019-01-13 DIAGNOSIS — N390 Urinary tract infection, site not specified: Secondary | ICD-10-CM

## 2019-01-13 MED ORDER — CIPROFLOXACIN HCL 500 MG PO TABS: 500.0000 mg | ORAL_TABLET | Freq: Two times a day (BID) | ORAL | 0 refills | Status: DC

## 2019-01-18 ENCOUNTER — Institutional Professional Consult (permissible substitution): Payer: Self-pay | Admitting: Neurology

## 2019-01-25 ENCOUNTER — Other Ambulatory Visit: Payer: Self-pay | Admitting: Nurse Practitioner

## 2019-01-25 DIAGNOSIS — M549 Dorsalgia, unspecified: Secondary | ICD-10-CM

## 2019-02-06 ENCOUNTER — Telehealth: Payer: Self-pay | Admitting: Family Medicine

## 2019-02-06 ENCOUNTER — Telehealth (INDEPENDENT_AMBULATORY_CARE_PROVIDER_SITE_OTHER): Payer: Medicare Other | Admitting: Family Medicine

## 2019-02-06 ENCOUNTER — Other Ambulatory Visit: Payer: Self-pay

## 2019-02-06 DIAGNOSIS — S51811A Laceration without foreign body of right forearm, initial encounter: Secondary | ICD-10-CM | POA: Diagnosis not present

## 2019-02-06 MED ORDER — FLUCONAZOLE 150 MG PO TABS: 150.0000 mg | ORAL_TABLET | Freq: Every day | ORAL | 2 refills | Status: DC

## 2019-02-06 MED ORDER — CEPHALEXIN 500 MG PO CAPS: 500.0000 mg | ORAL_CAPSULE | Freq: Four times a day (QID) | ORAL | 0 refills | Status: DC

## 2019-02-06 NOTE — Telephone Encounter (Signed)
Meds resent. Patient aware 

## 2019-02-06 NOTE — Telephone Encounter (Signed)
Televisit appt made.  

## 2019-02-06 NOTE — Progress Notes (Signed)
Virtual Visit via telephone Note  I connected with Erin Donaldson on 02/06/19 at  1007 by telephone and verified that I am speaking with the correct person using two identifiers. Erin Donaldson is currently located at home and No other people are currently with her during visit. The provider, Elige Radon Jamelia Varano, MD is located in their office at time of visit.  Call ended at 1021  I discussed the limitations, risks, security and privacy concerns of performing an evaluation and management service by telephone and the availability of in person appointments. I also discussed with the patient that there may be a patient responsible charge related to this service. The patient expressed understanding and agreed to proceed.   History and Present Illness:   Has laceration on right arm that she fell and sustained a laceration with 4 spots. Yellowish and bloody discharge is her biggest complaint currently.  She says she has been putting some bandages on it and trying to keep it as clean as she could but she still having the drainage.  She says she has 4 small skin tears that she is used for 3 inch bandages to cover them so it is covering a good chunk of her forearm.  She denies any fevers or chills or shortness of breath or wheezing.  She is not coming in today because she does have some nasal congestion symptoms but she is not worried about those because she thinks there is allergies. No diagnosis found.  Outpatient Encounter Medications as of 02/06/2019  Medication Sig  . cefdinir (OMNICEF) 300 MG capsule Take 1 capsule (300 mg total) by mouth 2 (two) times daily. 1 po BID  . ciprofloxacin (CIPRO) 500 MG tablet Take 1 tablet (500 mg total) by mouth 2 (two) times daily.  . cyclobenzaprine (FLEXERIL) 10 MG tablet TAKE 1/2 TO 1 TABLET BY MOUTH TWICE A DAY AS DIRECTED  . donepezil (ARICEPT) 10 MG tablet TAKE 1 TABLET BY MOUTH EVERY DAY  . DULoxetine (CYMBALTA) 30 MG capsule TAKE 1 CAPSULE BY MOUTH  EVERY DAY  . fluconazole (DIFLUCAN) 150 MG tablet Take 1 tablet (150 mg total) by mouth daily.  . fluticasone (FLONASE) 50 MCG/ACT nasal spray Place 2 sprays into both nostrils daily.  . furosemide (LASIX) 20 MG tablet TAKE 1 TABLET BY MOUTH EVERY DAY  . gabapentin (NEURONTIN) 300 MG capsule TAKE 3 CAPSULE BY MOUTH THREE TIMES A DAY  . lamoTRIgine (LAMICTAL) 200 MG tablet Take 1 tablet (200 mg total) by mouth daily.  . meclizine (ANTIVERT) 25 MG tablet Take 1 tablet (25 mg total) by mouth 3 (three) times daily as needed for dizziness.  . nystatin (MYCOSTATIN) 100000 UNIT/ML suspension TAKE 5 MLS (500,000 UNITS TOTAL) BY MOUTH 4 (FOUR) TIMES DAILY.  Marland Kitchen nystatin cream (MYCOSTATIN) APPLY 1 APPLICATION TOPICALLY 2 (TWO) TIMES DAILY.  . pantoprazole (PROTONIX) 40 MG tablet TAKE 2 TABLETS (80 MG TOTAL) BY MOUTH DAILY.  Marland Kitchen QUEtiapine (SEROQUEL) 200 MG tablet TAKE 1 TABLET BY MOUTH EVERY DAY  . QUEtiapine (SEROQUEL) 400 MG tablet TAKE 2 TABLETS (800 MG TOTAL) BY MOUTH AT BEDTIME.  . rizatriptan (MAXALT) 10 MG tablet Take 1 tablet (10 mg total) by mouth as needed. May repeat in 2 hours if needed  . Vitamin D, Ergocalciferol, (DRISDOL) 1.25 MG (50000 UT) CAPS capsule TAKE 1 CAPSULE (50,000 UNITS TOTAL) BY MOUTH 2 (TWO) TIMES A WEEK.   No facility-administered encounter medications on file as of 02/06/2019.      Observations/Objective:  Review of Systems  Constitutional: Negative for chills and fever.  HENT: Positive for congestion and sinus pain. Negative for ear pain and tinnitus.   Eyes: Negative for pain.  Respiratory: Negative for cough, shortness of breath and wheezing.   Cardiovascular: Negative for chest pain, palpitations and leg swelling.  Genitourinary: Negative for dysuria and hematuria.  Musculoskeletal: Negative for back pain, joint pain and myalgias.  Skin: Negative for rash.  Neurological: Negative for dizziness, sensory change, focal weakness, weakness and headaches.   Psychiatric/Behavioral: Negative for depression and suicidal ideas.     Forearm laceration with drainage  Assessment and Plan: Problem List Items Addressed This Visit    None    Visit Diagnoses    Skin tear of right forearm without complication, initial encounter    -  Primary   Relevant Medications   cephALEXin (KEFLEX) 500 MG capsule       Follow Up Instructions:  Instructed patient to call if anything worsens, call in an antibiotic to help just in case the drainage is purulent in nature, recommended for topical dressing changes every day and brief washes soap.   I discussed the assessment and treatment plan with the patient. The patient was provided an opportunity to ask questions and all were answered. The patient agreed with the plan and demonstrated an understanding of the instructions.   The patient was advised to call back or seek an in-person evaluation if the symptoms worsen or if the condition fails to improve as anticipated.  The above assessment and management plan was discussed with the patient. The patient verbalized understanding of and has agreed to the management plan. Patient is aware to call the clinic if symptoms persist or worsen. Patient is aware when to return to the clinic for a follow-up visit. Patient educated on when it is appropriate to go to the emergency department.    I provided 14 minutes of non-face-to-face time during this encounter.    Nils Pyle, MD

## 2019-02-06 NOTE — Telephone Encounter (Signed)
Pt fell last week, but has ssome URI stuff going on.  [facility]  SWITCHBOARD  SICK CALL SCREENING   02/06/2019  Erin Donaldson    IWO:[id number]    DOB:22-Mar-1951  Best Contact Telephone Number:    1. Symptoms: dRAINAGE AND HORSE  2. Symptom Onset: LAST WEEK  3. Have you traveled any over the past 14 days? No  4.   Have you been in recent contact with someone that has tested positive for COVID-19? No  5.   Which pharmacy would you use today if given a prescription? [facility]   Follow-Up Plan Erin Donaldson was informed that this information will be given to a clinical staff member to review and that they should receive a follow-up telephone call within 24 hours. she was advised to call back if she develops any new symptoms or if her current symptoms worsen.   Screened by: Austin Miles

## 2019-02-16 ENCOUNTER — Telehealth: Payer: Self-pay | Admitting: Family Medicine

## 2019-02-16 ENCOUNTER — Other Ambulatory Visit: Payer: Self-pay | Admitting: Family Medicine

## 2019-02-16 NOTE — Telephone Encounter (Signed)
Please review

## 2019-02-17 NOTE — Telephone Encounter (Signed)
Patient has chronic medical conditions and she is over 80 and she is at risk for [facility] at 77.  She should stay in the house as much as possible avoid contact with other people do handwashing and practice good respiratory care.  Anemia is 1 of the conditions that she has it would put her at risk for getting this infection.

## 2019-02-17 NOTE — Telephone Encounter (Signed)
Pt aware of recommendations

## 2019-02-21 ENCOUNTER — Other Ambulatory Visit: Payer: Self-pay | Admitting: Family Medicine

## 2019-02-27 ENCOUNTER — Encounter (INDEPENDENT_AMBULATORY_CARE_PROVIDER_SITE_OTHER): Payer: Self-pay

## 2019-03-04 ENCOUNTER — Other Ambulatory Visit: Payer: Self-pay | Admitting: Nurse Practitioner

## 2019-03-04 DIAGNOSIS — M549 Dorsalgia, unspecified: Secondary | ICD-10-CM

## 2019-03-09 ENCOUNTER — Other Ambulatory Visit: Payer: Self-pay

## 2019-03-09 ENCOUNTER — Encounter: Payer: Self-pay | Admitting: Family Medicine

## 2019-03-09 ENCOUNTER — Ambulatory Visit (INDEPENDENT_AMBULATORY_CARE_PROVIDER_SITE_OTHER): Payer: Medicare Other | Admitting: Family Medicine

## 2019-03-09 DIAGNOSIS — R5383 Other fatigue: Secondary | ICD-10-CM

## 2019-03-09 DIAGNOSIS — I959 Hypotension, unspecified: Secondary | ICD-10-CM | POA: Diagnosis not present

## 2019-03-09 NOTE — Progress Notes (Signed)
Virtual Visit via telephone Note  I connected with Erin Donaldson on 03/09/19 at 1430 by telephone and verified that I am speaking with the correct person using two identifiers. Erin Donaldson is currently located at home and no other people are currently with her during visit. The provider, Elige RadonJoshua A Dettinger, MD is located in their office at time of visit.  Call ended at 1439  I discussed the limitations, risks, security and privacy concerns of performing an evaluation and management service by telephone and the availability of in person appointments. I also discussed with the patient that there may be a patient responsible charge related to this service. The patient expressed understanding and agreed to proceed.   History and Present Illness: Yesterday 104/56 and 96/58 and she has been feeling tired. Patient denies any fevers or chills or urinary symptoms.  She just says she feels a little bit more tired over the past 2 days and her blood pressure has been down a little bit more.  She denies any of her medications being new and says that she is been on all of them for quite many years.  She says she is still making urine and denies any signs of infection and denies any respiratory symptoms.  She also denies any chest pain or shortness of breath or wheezing.  She denies any abnormal rashes.  She did not have any symptoms of feeling ill with body aches except for that she has decreased energy and her blood pressures down.  She denies any lightheadedness or dizziness.  No diagnosis found.  Outpatient Encounter Medications as of 03/09/2019  Medication Sig  . cefdinir (OMNICEF) 300 MG capsule Take 1 capsule (300 mg total) by mouth 2 (two) times daily. 1 po BID  . cephALEXin (KEFLEX) 500 MG capsule Take 1 capsule (500 mg total) by mouth 4 (four) times daily.  . ciprofloxacin (CIPRO) 500 MG tablet Take 1 tablet (500 mg total) by mouth 2 (two) times daily.  . cyclobenzaprine (FLEXERIL) 10 MG  tablet TAKE 1/2 TO 1 TABLET BY MOUTH TWICE A DAY AS DIRECTED  . donepezil (ARICEPT) 10 MG tablet TAKE 1 TABLET BY MOUTH EVERY DAY  . DULoxetine (CYMBALTA) 30 MG capsule TAKE 1 CAPSULE BY MOUTH EVERY DAY  . fluconazole (DIFLUCAN) 150 MG tablet Take 1 tablet (150 mg total) by mouth daily.  . fluticasone (FLONASE) 50 MCG/ACT nasal spray Place 2 sprays into both nostrils daily.  . furosemide (LASIX) 20 MG tablet TAKE 1 TABLET BY MOUTH EVERY DAY  . gabapentin (NEURONTIN) 300 MG capsule TAKE 3 CAPSULE BY MOUTH THREE TIMES A DAY  . lamoTRIgine (LAMICTAL) 200 MG tablet Take 1 tablet (200 mg total) by mouth daily.  . meclizine (ANTIVERT) 25 MG tablet TAKE 1 TABLET (25 MG TOTAL) BY MOUTH 3 (THREE) TIMES DAILY AS NEEDED FOR DIZZINESS.  Marland Kitchen. nystatin (MYCOSTATIN) 100000 UNIT/ML suspension TAKE 5 MLS (500,000 UNITS TOTAL) BY MOUTH 4 (FOUR) TIMES DAILY.  Marland Kitchen. nystatin cream (MYCOSTATIN) APPLY 1 APPLICATION TOPICALLY 2 (TWO) TIMES DAILY.  . pantoprazole (PROTONIX) 40 MG tablet TAKE 2 TABLETS (80 MG TOTAL) BY MOUTH DAILY.  Marland Kitchen. QUEtiapine (SEROQUEL) 200 MG tablet TAKE 1 TABLET BY MOUTH EVERY DAY  . QUEtiapine (SEROQUEL) 400 MG tablet TAKE 2 TABLETS (800 MG TOTAL) BY MOUTH AT BEDTIME.  . rizatriptan (MAXALT) 10 MG tablet Take 1 tablet (10 mg total) by mouth as needed. May repeat in 2 hours if needed  . Vitamin D, Ergocalciferol, (DRISDOL) 1.25 MG (  50000 UT) CAPS capsule TAKE 1 CAPSULE (50,000 UNITS TOTAL) BY MOUTH 2 (TWO) TIMES A WEEK.   No facility-administered encounter medications on file as of 03/09/2019.     Review of Systems  Constitutional: Positive for fatigue. Negative for chills and fever.  Eyes: Negative for visual disturbance.  Respiratory: Negative for chest tightness and shortness of breath.   Cardiovascular: Negative for chest pain and leg swelling.  Musculoskeletal: Negative for back pain and gait problem.  Skin: Negative for rash.  Neurological: Negative for dizziness, weakness, light-headedness,  numbness and headaches.  Psychiatric/Behavioral: Negative for agitation and behavioral problems.  All other systems reviewed and are negative.   Observations/Objective: Patient sounds comfortable on the phone in no acute distress  Assessment and Plan: Problem List Items Addressed This Visit    None    Visit Diagnoses    Other fatigue    -  Primary   Hypotension, unspecified hypotension type           Follow Up Instructions: Follow-up as needed but if she continues to have symptoms, may consider an echocardiogram in the future.   I discussed the assessment and treatment plan with the patient. The patient was provided an opportunity to ask questions and all were answered. The patient agreed with the plan and demonstrated an understanding of the instructions.   The patient was advised to call back or seek an in-person evaluation if the symptoms worsen or if the condition fails to improve as anticipated.  The above assessment and management plan was discussed with the patient. The patient verbalized understanding of and has agreed to the management plan. Patient is aware to call the clinic if symptoms persist or worsen. Patient is aware when to return to the clinic for a follow-up visit. Patient educated on when it is appropriate to go to the emergency department.    I provided 9 minutes of non-face-to-face time during this encounter.    Nils Pyle, MD

## 2019-03-13 ENCOUNTER — Other Ambulatory Visit: Payer: Self-pay | Admitting: Psychiatry

## 2019-03-13 ENCOUNTER — Encounter (INDEPENDENT_AMBULATORY_CARE_PROVIDER_SITE_OTHER): Payer: Self-pay | Admitting: *Deleted

## 2019-03-14 ENCOUNTER — Encounter: Payer: Self-pay | Admitting: Family Medicine

## 2019-03-14 ENCOUNTER — Ambulatory Visit (INDEPENDENT_AMBULATORY_CARE_PROVIDER_SITE_OTHER): Payer: Medicare Other | Admitting: Family Medicine

## 2019-03-14 ENCOUNTER — Other Ambulatory Visit: Payer: Self-pay

## 2019-03-14 DIAGNOSIS — N3090 Cystitis, unspecified without hematuria: Secondary | ICD-10-CM

## 2019-03-14 MED ORDER — CEPHALEXIN 500 MG PO CAPS: 500.0000 mg | ORAL_CAPSULE | Freq: Three times a day (TID) | ORAL | 0 refills | Status: DC

## 2019-03-14 NOTE — Progress Notes (Signed)
Subjective:    Patient ID: Erin Donaldson, female    DOB: 1951/03/23, 68 y.o.   MRN: [id number]   HPI: Erin Donaldson is a 68 y.o. female presenting for burning with urination and frequency for several days. Denies fever . No flank pain. No nausea, vomiting.    Depression screen [facility] 2/9 12/21/2018 11/23/2018 11/07/2018 11/03/2018 10/19/2018  Decreased Interest 1 3 2  0 2  Down, Depressed, Hopeless 1 3 2  0 2  PHQ - 2 Score 2 6 4  0 4  Altered sleeping 0 0 0 - 1  Tired, decreased energy 2 2 3  - 3  Change in appetite 0 0 0 - 0  Feeling bad or failure about yourself  1 2 0 - 1  Trouble concentrating 2 2 1  - 2  Moving slowly or fidgety/restless 0 0 0 - 0  Suicidal thoughts 0 0 0 - 0  PHQ-9 Score 7 12 8  - 11  Difficult doing work/chores Somewhat difficult Somewhat difficult - - Somewhat difficult  Some recent data might be hidden     Relevant past medical, surgical, family and social history reviewed and updated as indicated.  Interim medical history since our last visit reviewed. Allergies and medications reviewed and updated.  ROS:  Review of Systems  Constitutional: Negative for chills, diaphoresis and fever.  HENT: Negative for congestion.   Eyes: Negative for visual disturbance.  Respiratory: Negative for cough and shortness of breath.   Cardiovascular: Negative for chest pain and palpitations.  Gastrointestinal: Negative for constipation, diarrhea and nausea.  Genitourinary: Positive for dysuria, frequency and urgency. Negative for decreased urine volume, flank pain, hematuria, menstrual problem and pelvic pain.  Musculoskeletal: Negative for arthralgias and joint swelling.  Skin: Negative for rash.  Neurological: Negative for dizziness and numbness.     Social History   Tobacco Use  Smoking Status Never Smoker  Smokeless Tobacco Never Used       Objective:     Wt Readings from Last 3 Encounters:  12/21/18 169 lb (76.7 kg)  11/23/18 173 lb (78.5 kg)  11/07/18  174 lb (78.9 kg)     Exam deferred. Pt. Harboring due to COVID 19. Phone visit performed.   Assessment & Plan:   1. Cystitis     Meds ordered this encounter  Medications  . cephALEXin (KEFLEX) 500 MG capsule    Sig: Take 1 capsule (500 mg total) by mouth 3 (three) times daily.    Dispense:  21 capsule    Refill:  0    No orders of the defined types were placed in this encounter.     Diagnoses and all orders for this visit:  Cystitis  Other orders -     cephALEXin (KEFLEX) 500 MG capsule; Take 1 capsule (500 mg total) by mouth 3 (three) times daily.    Virtual Visit via telephone Note  I discussed the limitations, risks, security and privacy concerns of performing an evaluation and management service by telephone and the availability of in person appointments. The patient was identified with two identifiers. Pt.expressed understanding and agreed to proceed. Pt. Is at home. Dr. Darlyn Read is in his office.  Follow Up Instructions:   I discussed the assessment and treatment plan with the patient. The patient was provided an opportunity to ask questions and all were answered. The patient agreed with the plan and demonstrated an understanding of the instructions.   The patient was advised to call back or seek an in-person evaluation  if the symptoms worsen or if the condition fails to improve as anticipated.  Visit started: 1:45 Call ended:  1:50 Total minutes including chart review and phone contact time: 8   Follow up plan: Return if symptoms worsen or fail to improve.  Mechele ClaudeWarren Purvi Ruehl, MD [facility]

## 2019-03-15 ENCOUNTER — Other Ambulatory Visit: Payer: Self-pay | Admitting: Family Medicine

## 2019-03-17 ENCOUNTER — Other Ambulatory Visit: Payer: Self-pay

## 2019-03-17 ENCOUNTER — Ambulatory Visit (INDEPENDENT_AMBULATORY_CARE_PROVIDER_SITE_OTHER): Payer: Medicare Other | Admitting: Family

## 2019-03-17 ENCOUNTER — Encounter: Payer: Self-pay | Admitting: Family

## 2019-03-17 DIAGNOSIS — R634 Abnormal weight loss: Secondary | ICD-10-CM

## 2019-03-17 DIAGNOSIS — E860 Dehydration: Secondary | ICD-10-CM | POA: Diagnosis not present

## 2019-03-17 DIAGNOSIS — I959 Hypotension, unspecified: Secondary | ICD-10-CM

## 2019-03-17 DIAGNOSIS — R531 Weakness: Secondary | ICD-10-CM

## 2019-03-17 DIAGNOSIS — N390 Urinary tract infection, site not specified: Secondary | ICD-10-CM | POA: Diagnosis not present

## 2019-03-17 NOTE — Progress Notes (Signed)
   Virtual Visit via telephone Note  I connected with Erin Donaldson on 03/17/19 at 4:10 pm by telephone and verified that I am speaking with the correct person using two identifiers. Erin Donaldson is currently located at home  and grandaughter is currently with her during visit. The provider, Jannifer Rodney, FNP is located in their office at time of visit.  I discussed the limitations, risks, security and privacy concerns of performing an evaluation and management service by telephone and the availability of in person appointments. I also discussed with the patient that there may be a patient responsible charge related to this service. The patient expressed understanding and agreed to proceed.   History and Present Illness:   HPI Pt calls today with hypotension, blurred vision, and light headedness that started one week ago. She states she has taken her BP at home was 101/61, HR 70.   She states she has a UTI and currently taking Keflex TID.   She states she takes the lasix 20 mg everyday for peripheral edema. She reports she does not have any edema at this time.   She reports losing 26 lbs over the last two months because of lost of appetite.    Review of Systems  Constitutional: Positive for malaise/fatigue and weight loss.  Eyes: Positive for blurred vision.  Gastrointestinal: Positive for constipation.  Neurological: Positive for dizziness.  All other systems reviewed and are negative.    Observations/Objective: No SOB or distress noted, dry mouth  Assessment and Plan: Erin Donaldson comes in today with chief complaint of No chief complaint on file.   Diagnosis and orders addressed:  1. Urinary tract infection without hematuria, site unspecified Continue Keflex  2. Hypotension, unspecified hypotension type  3. Weakness  4. Dehydration  5. Weight loss    Hold lasix for next 3 days I have removed Flexeril off of med list today and told patient not to take  Force fluids!!!!  Pt will return to the office next week for lab work and face to face visit If dizziness, light headedness, or hypotension worsens she will go to ED. Fall preventions discussed  She needs further work-up for weight loss.       I discussed the assessment and treatment plan with the patient. The patient was provided an opportunity to ask questions and all were answered. The patient agreed with the plan and demonstrated an understanding of the instructions.   The patient was advised to call back or seek an in-person evaluation if the symptoms worsen or if the condition fails to improve as anticipated.  The above assessment and management plan was discussed with the patient. The patient verbalized understanding of and has agreed to the management plan. Patient is aware to call the clinic if symptoms persist or worsen. Patient is aware when to return to the clinic for a follow-up visit. Patient educated on when it is appropriate to go to the emergency department.   Time call ended:  4:27 pm  I provided 17 minutes of non-face-to-face time during this encounter.    Jannifer Rodney, FNP

## 2019-03-22 ENCOUNTER — Other Ambulatory Visit: Payer: Self-pay

## 2019-03-23 ENCOUNTER — Ambulatory Visit (INDEPENDENT_AMBULATORY_CARE_PROVIDER_SITE_OTHER): Payer: Medicare Other | Admitting: Family

## 2019-03-23 ENCOUNTER — Encounter: Payer: Self-pay | Admitting: Family

## 2019-03-23 VITALS — BP 111/73 | HR 95 | Temp 97.4°F | Ht 64.0 in | Wt 164.5 lb

## 2019-03-23 DIAGNOSIS — N390 Urinary tract infection, site not specified: Secondary | ICD-10-CM | POA: Diagnosis not present

## 2019-03-23 DIAGNOSIS — H539 Unspecified visual disturbance: Secondary | ICD-10-CM | POA: Diagnosis not present

## 2019-03-23 DIAGNOSIS — R300 Dysuria: Secondary | ICD-10-CM

## 2019-03-23 DIAGNOSIS — R5383 Other fatigue: Secondary | ICD-10-CM

## 2019-03-23 DIAGNOSIS — R2689 Other abnormalities of gait and mobility: Secondary | ICD-10-CM | POA: Diagnosis not present

## 2019-03-23 DIAGNOSIS — R41840 Attention and concentration deficit: Secondary | ICD-10-CM

## 2019-03-23 NOTE — Progress Notes (Signed)
Subjective:    Patient ID: Erin Donaldson, female    DOB: 12-Mar-1951, 68 y.o.   MRN: [id number]  Chief Complaint  Patient presents with  . Fatigue    needs lab work  . Unable to concentrate    HPI PT presents to the office today with complaints of fatigue, weak, and hard to concentrate.   She reports over the last week she has had loss of balance, vision changes, and difficulty concentrating. She reports these are improving, but still present.   She completed Keflex, but states she still has dysuria and frequency. She has Urologists, but her appointment is not until June.    Review of Systems  Constitutional: Positive for fatigue.  Neurological: Positive for dizziness and weakness.  All other systems reviewed and are negative.      Objective:   Physical Exam Vitals signs reviewed.  Constitutional:      General: She is not in acute distress.    Appearance: She is well-developed.  HENT:     Head: Normocephalic and atraumatic.     Right Ear: Tympanic membrane normal.     Left Ear: Tympanic membrane normal.  Eyes:     Pupils: Pupils are equal, round, and reactive to light.  Neck:     Musculoskeletal: Normal range of motion and neck supple.     Thyroid: No thyromegaly.  Cardiovascular:     Rate and Rhythm: Normal rate and regular rhythm.     Heart sounds: Normal heart sounds. No murmur.  Pulmonary:     Effort: Pulmonary effort is normal. No respiratory distress.     Breath sounds: Normal breath sounds. No wheezing.  Abdominal:     General: Bowel sounds are normal. There is no distension.     Palpations: Abdomen is soft.     Tenderness: There is no abdominal tenderness.  Musculoskeletal: Normal range of motion.        General: No tenderness.  Skin:    General: Skin is warm and dry.  Neurological:     Mental Status: She is alert and oriented to person, place, and time.     Cranial Nerves: No cranial nerve deficit.     Deep Tendon Reflexes: Reflexes are normal and  symmetric.  Psychiatric:        Behavior: Behavior normal.        Thought Content: Thought content normal.        Judgment: Judgment normal.       BP 111/73   Pulse 95   Temp (!) 97.4 F (36.3 C) (Oral)   Ht '5\' 4"'  (1.626 m)   Wt 164 lb 8 oz (74.6 kg)   BMI 28.24 kg/m      Assessment & Plan:  HENYA AGUALLO comes in today with chief complaint of Fatigue (needs lab work) and Unable to concentrate   Diagnosis and orders addressed:  1. Recurrent UTI -She is unable to leave sample in office, will bring one back today - Anemia Profile B - CMP14+EGFR - Urinalysis, Complete - Urine Culture  2. Dysuria - Anemia Profile B - CMP14+EGFR - Urinalysis, Complete - Urine Culture  3. Fatigue, unspecified type Labs pending - Anemia Profile B - CMP14+EGFR  4. Loss of balance Neuro exam normal today, but given symptoms will do CT scan of head  - Anemia Profile B - CMP14+EGFR - CT HEAD W & WO CONTRAST; Future  5. Vision changes - Anemia Profile B - CMP14+EGFR - CT HEAD W &  WO CONTRAST; Future  6. Difficulty concentrating - Anemia Profile B - CMP14+EGFR - CT HEAD W & WO CONTRAST; Future   Labs pending Continue medications  Keep follow up with Urologists and PCP  Diet and exercise encouraged    [name], FNP

## 2019-03-23 NOTE — Patient Instructions (Signed)

## 2019-03-24 ENCOUNTER — Other Ambulatory Visit: Payer: Self-pay | Admitting: Family

## 2019-03-24 DIAGNOSIS — E538 Deficiency of other specified B group vitamins: Secondary | ICD-10-CM

## 2019-03-24 DIAGNOSIS — R634 Abnormal weight loss: Secondary | ICD-10-CM

## 2019-03-24 DIAGNOSIS — D509 Iron deficiency anemia, unspecified: Secondary | ICD-10-CM

## 2019-03-24 LAB — CMP14+EGFR
ALT: 11 IU/L (ref 0–32)
AST: 16 IU/L (ref 0–40)
Albumin/Globulin Ratio: 1.8 (ref 1.2–2.2)
Albumin: 4.1 g/dL (ref 3.8–4.8)
Alkaline Phosphatase: 246 IU/L — ABNORMAL HIGH (ref 39–117)
BUN/Creatinine Ratio: 13 (ref 12–28)
BUN: 14 mg/dL (ref 8–27)
Bilirubin Total: <0.2 mg/dL (ref 0.0–1.2)
CO2: 21 mmol/L (ref 20–29)
Calcium: 9.4 mg/dL (ref 8.7–10.3)
Chloride: 107 mmol/L — ABNORMAL HIGH (ref 96–106)
Creatinine, Ser: 1.12 mg/dL — ABNORMAL HIGH (ref 0.57–1.00)
GFR calc Af Amer: 60 mL/min/{1.73_m2} (ref >59)
GFR calc non Af Amer: 52 mL/min/{1.73_m2} — ABNORMAL LOW (ref >59)
Globulin, Total: 2.3 g/dL (ref 1.5–4.5)
Glucose: 156 mg/dL — ABNORMAL HIGH (ref 65–99)
Potassium: 4.9 mmol/L (ref 3.5–5.2)
Sodium: 142 mmol/L (ref 134–144)
Total Protein: 6.4 g/dL (ref 6.0–8.5)

## 2019-03-24 LAB — ANEMIA PROFILE B
Basophils Absolute: 0.1 10*3/uL (ref 0.0–0.2)
Basos: 1 %
EOS (ABSOLUTE): 0.2 10*3/uL (ref 0.0–0.4)
Eos: 3 %
Ferritin: 9 ng/mL — ABNORMAL LOW (ref 15–150)
Folate: 3 ng/mL — ABNORMAL LOW (ref >3.0)
Hematocrit: 33.6 % — ABNORMAL LOW (ref 34.0–46.6)
Hemoglobin: 10.8 g/dL — ABNORMAL LOW (ref 11.1–15.9)
Immature Grans (Abs): 0.1 10*3/uL (ref 0.0–0.1)
Immature Granulocytes: 1 %
Iron Saturation: 10 % — ABNORMAL LOW (ref 15–55)
Iron: 32 ug/dL (ref 27–139)
Lymphocytes Absolute: 1.3 10*3/uL (ref 0.7–3.1)
Lymphs: 18 %
MCH: 24.7 pg — ABNORMAL LOW (ref 26.6–33.0)
MCHC: 32.1 g/dL (ref 31.5–35.7)
MCV: 77 fL — ABNORMAL LOW (ref 79–97)
Monocytes Absolute: 0.5 10*3/uL (ref 0.1–0.9)
Monocytes: 6 %
Neutrophils Absolute: 5.3 10*3/uL (ref 1.4–7.0)
Neutrophils: 71 %
Platelets: 261 10*3/uL (ref 150–450)
RBC: 4.37 x10E6/uL (ref 3.77–5.28)
RDW: 15.3 % (ref 11.7–15.4)
Retic Ct Pct: 0.8 % (ref 0.6–2.6)
Total Iron Binding Capacity: 305 ug/dL (ref 250–450)
UIBC: 273 ug/dL (ref 118–369)
Vitamin B-12: 315 pg/mL (ref 232–1245)
WBC: 7.3 10*3/uL (ref 3.4–10.8)

## 2019-03-29 ENCOUNTER — Telehealth: Payer: Self-pay | Admitting: Family

## 2019-03-29 NOTE — Telephone Encounter (Signed)
sw pt to confirm newpatient appt 6/8 at 130 pm. Pt aware of appt date/time/location

## 2019-03-31 ENCOUNTER — Other Ambulatory Visit: Payer: Self-pay

## 2019-03-31 ENCOUNTER — Ambulatory Visit
Admission: RE | Admit: 2019-03-31 | Discharge: 2019-03-31 | Disposition: A | Discharge status: Home / Self Care | Payer: Medicare Other | Source: Ambulatory Visit | Attending: Family | Admitting: Family

## 2019-03-31 DIAGNOSIS — R2689 Other abnormalities of gait and mobility: Secondary | ICD-10-CM

## 2019-03-31 DIAGNOSIS — H539 Unspecified visual disturbance: Secondary | ICD-10-CM

## 2019-03-31 DIAGNOSIS — R41840 Attention and concentration deficit: Secondary | ICD-10-CM

## 2019-03-31 MED ORDER — IOPAMIDOL (ISOVUE-300) INJECTION 61%
75.0000 mL | Freq: Once | INTRAVENOUS | Status: AC | PRN
  Administered 2019-03-31: 75 mL via INTRAVENOUS

## 2019-04-03 ENCOUNTER — Ambulatory Visit (INDEPENDENT_AMBULATORY_CARE_PROVIDER_SITE_OTHER): Payer: Medicare Other | Admitting: Family

## 2019-04-03 ENCOUNTER — Encounter: Payer: Self-pay | Admitting: Family

## 2019-04-03 ENCOUNTER — Other Ambulatory Visit: Payer: Self-pay

## 2019-04-03 DIAGNOSIS — F3170 Bipolar disorder, currently in remission, most recent episode unspecified: Secondary | ICD-10-CM | POA: Diagnosis not present

## 2019-04-03 DIAGNOSIS — R42 Dizziness and giddiness: Secondary | ICD-10-CM

## 2019-04-03 DIAGNOSIS — D509 Iron deficiency anemia, unspecified: Secondary | ICD-10-CM | POA: Diagnosis not present

## 2019-04-03 DIAGNOSIS — R5383 Other fatigue: Secondary | ICD-10-CM

## 2019-04-03 NOTE — Progress Notes (Signed)
   Virtual Visit via telephone Note  I connected with Erin Donaldson on 04/03/19 at 3:29 pm by telephone and verified that I am speaking with the correct person using two identifiers. Erin Donaldson is currently located at home and no one is currently with her during visit. The provider, Jannifer Rodney, FNP is located in their office at time of visit.  I discussed the limitations, risks, security and privacy concerns of performing an evaluation and management service by telephone and the availability of in person appointments. I also discussed with the patient that there may be a patient responsible charge related to this service. The patient expressed understanding and agreed to proceed.   History and Present Illness:     HPI  PT calls today with recurrent dizziness, fatigue, and decrease concentration. She was seen on 03/23/19 and we ordered a CT head. This was negative for acute findings. She states these symptoms have improved, but continue come and go. We also did lab work and found she had iron deficiency  anemia. She has an appointment Hematologists 04/24/19.   She is followed by [facility] annually for Bipolar and is on high doses of antipsychotics.   Review of Systems  Constitutional: Positive for malaise/fatigue.  All other systems reviewed and are negative.    Observations/Objective: No SOB or distress noted  Assessment and Plan: 1. Dizziness  2. Iron deficiency anemia, unspecified iron deficiency anemia type  3. Fatigue, unspecified type   4. Bipolar affective disorder in remission [facility])   We reviewed her lab work, keep follow up with Oncologists CT scan was normal- Discussed results Pt is on high doses of antipsychotics, discussed this could be causing some of her memory problems and fatigue. She does not have follow up with [facility] until December. If her symptoms do not improve after receiving iron she may need to follow up sooner for  medication adjustment.   States her symptoms are improving, continue to hold lasix and flexeril.  Force fluids Fall preventions discussed      I discussed the assessment and treatment plan with the patient. The patient was provided an opportunity to ask questions and all were answered. The patient agreed with the plan and demonstrated an understanding of the instructions.   The patient was advised to call back or seek an in-person evaluation if the symptoms worsen or if the condition fails to improve as anticipated.  The above assessment and management plan was discussed with the patient. The patient verbalized understanding of and has agreed to the management plan. Patient is aware to call the clinic if symptoms persist or worsen. Patient is aware when to return to the clinic for a follow-up visit. Patient educated on when it is appropriate to go to the emergency department.   Time call ended:  3:44 pm  I provided 15 minutes of non-face-to-face time during this encounter.    Jannifer Rodney, FNP

## 2019-04-19 ENCOUNTER — Other Ambulatory Visit: Payer: Self-pay | Admitting: Family Medicine

## 2019-04-19 DIAGNOSIS — M549 Dorsalgia, unspecified: Secondary | ICD-10-CM

## 2019-04-22 ENCOUNTER — Other Ambulatory Visit: Payer: Self-pay | Admitting: Family

## 2019-04-22 DIAGNOSIS — D509 Iron deficiency anemia, unspecified: Secondary | ICD-10-CM

## 2019-04-24 ENCOUNTER — Other Ambulatory Visit: Payer: Self-pay

## 2019-04-24 ENCOUNTER — Inpatient Hospital Stay: Payer: Medicare Other

## 2019-04-24 ENCOUNTER — Inpatient Hospital Stay (HOSPITAL_BASED_OUTPATIENT_CLINIC_OR_DEPARTMENT_OTHER): Payer: Medicare Other | Admitting: Family

## 2019-04-24 ENCOUNTER — Inpatient Hospital Stay: Payer: Medicare Other | Attending: Family

## 2019-04-24 ENCOUNTER — Encounter: Payer: Self-pay | Admitting: Family

## 2019-04-24 VITALS — BP 117/81 | HR 83 | Resp 17 | Wt 157.0 lb

## 2019-04-24 VITALS — BP 119/66 | HR 79

## 2019-04-24 DIAGNOSIS — R5383 Other fatigue: Secondary | ICD-10-CM

## 2019-04-24 DIAGNOSIS — M7989 Other specified soft tissue disorders: Secondary | ICD-10-CM

## 2019-04-24 DIAGNOSIS — R7989 Other specified abnormal findings of blood chemistry: Secondary | ICD-10-CM

## 2019-04-24 DIAGNOSIS — R0609 Other forms of dyspnea: Secondary | ICD-10-CM | POA: Diagnosis not present

## 2019-04-24 DIAGNOSIS — Z9884 Bariatric surgery status: Secondary | ICD-10-CM

## 2019-04-24 DIAGNOSIS — R42 Dizziness and giddiness: Secondary | ICD-10-CM | POA: Diagnosis not present

## 2019-04-24 DIAGNOSIS — K909 Intestinal malabsorption, unspecified: Secondary | ICD-10-CM | POA: Diagnosis not present

## 2019-04-24 DIAGNOSIS — D692 Other nonthrombocytopenic purpura: Secondary | ICD-10-CM

## 2019-04-24 DIAGNOSIS — F3170 Bipolar disorder, currently in remission, most recent episode unspecified: Secondary | ICD-10-CM

## 2019-04-24 DIAGNOSIS — E559 Vitamin D deficiency, unspecified: Secondary | ICD-10-CM

## 2019-04-24 DIAGNOSIS — D509 Iron deficiency anemia, unspecified: Secondary | ICD-10-CM

## 2019-04-24 DIAGNOSIS — M25551 Pain in right hip: Secondary | ICD-10-CM

## 2019-04-24 DIAGNOSIS — E1169 Type 2 diabetes mellitus with other specified complication: Secondary | ICD-10-CM

## 2019-04-24 DIAGNOSIS — R9431 Abnormal electrocardiogram [ECG] [EKG]: Secondary | ICD-10-CM

## 2019-04-24 DIAGNOSIS — E663 Overweight: Secondary | ICD-10-CM

## 2019-04-24 DIAGNOSIS — E785 Hyperlipidemia, unspecified: Secondary | ICD-10-CM

## 2019-04-24 DIAGNOSIS — I10 Essential (primary) hypertension: Secondary | ICD-10-CM

## 2019-04-24 DIAGNOSIS — F04 Amnestic disorder due to known physiological condition: Secondary | ICD-10-CM

## 2019-04-24 LAB — CBC WITH DIFFERENTIAL (CANCER CENTER ONLY)
Abs Immature Granulocytes: 0.03 10*3/uL (ref 0.00–0.07)
Basophils Absolute: 0 10*3/uL (ref 0.0–0.1)
Basophils Relative: 1 %
Eosinophils Absolute: 0.1 10*3/uL (ref 0.0–0.5)
Eosinophils Relative: 2 %
HCT: 32.8 % — ABNORMAL LOW (ref 36.0–46.0)
Hemoglobin: 9.9 g/dL — ABNORMAL LOW (ref 12.0–15.0)
Immature Granulocytes: 1 %
Lymphocytes Relative: 25 %
Lymphs Abs: 1.5 10*3/uL (ref 0.7–4.0)
MCH: 24.6 pg — ABNORMAL LOW (ref 26.0–34.0)
MCHC: 30.2 g/dL (ref 30.0–36.0)
MCV: 81.6 fL (ref 80.0–100.0)
Monocytes Absolute: 0.4 10*3/uL (ref 0.1–1.0)
Monocytes Relative: 7 %
Neutro Abs: 4 10*3/uL (ref 1.7–7.7)
Neutrophils Relative %: 64 %
Platelet Count: 239 10*3/uL (ref 150–400)
RBC: 4.02 MIL/uL (ref 3.87–5.11)
RDW: 16.3 % — ABNORMAL HIGH (ref 11.5–15.5)
WBC Count: 6.1 10*3/uL (ref 4.0–10.5)
nRBC: 0 % (ref 0.0–0.2)

## 2019-04-24 LAB — CMP (CANCER CENTER ONLY)
ALT: 10 U/L (ref 0–44)
AST: 11 U/L — ABNORMAL LOW (ref 15–41)
Albumin: 3.8 g/dL (ref 3.5–5.0)
Alkaline Phosphatase: 199 U/L — ABNORMAL HIGH (ref 38–126)
Anion gap: 8 (ref 5–15)
BUN: 16 mg/dL (ref 8–23)
CO2: 27 mmol/L (ref 22–32)
Calcium: 9 mg/dL (ref 8.9–10.3)
Chloride: 108 mmol/L (ref 98–111)
Creatinine: 1.21 mg/dL — ABNORMAL HIGH (ref 0.44–1.00)
GFR, Est AFR Am: 54 mL/min — ABNORMAL LOW (ref >60)
GFR, Estimated: 47 mL/min — ABNORMAL LOW (ref >60)
Glucose, Bld: 133 mg/dL — ABNORMAL HIGH (ref 70–99)
Potassium: 3.8 mmol/L (ref 3.5–5.1)
Sodium: 143 mmol/L (ref 135–145)
Total Bilirubin: 0.3 mg/dL (ref 0.3–1.2)
Total Protein: 6.2 g/dL — ABNORMAL LOW (ref 6.5–8.1)

## 2019-04-24 LAB — RETICULOCYTES
Immature Retic Fract: 14.4 % (ref 2.3–15.9)
RBC.: 4.02 MIL/uL (ref 3.87–5.11)
Retic Count, Absolute: 45.8 10*3/uL (ref 19.0–186.0)
Retic Ct Pct: 1.1 % (ref 0.4–3.1)

## 2019-04-24 LAB — SAVE SMEAR(SSMR), FOR PROVIDER SLIDE REVIEW

## 2019-04-24 MED ORDER — SODIUM CHLORIDE 0.9 % IV SOLN
Freq: Once | INTRAVENOUS | Status: AC
  Administered 2019-04-24: 15:00:00 via INTRAVENOUS
  Filled 2019-04-24: qty 250

## 2019-04-24 MED ORDER — SODIUM CHLORIDE 0.9 % IV SOLN
510.0000 mg | Freq: Once | INTRAVENOUS | Status: AC
  Administered 2019-04-24: 510 mg via INTRAVENOUS
  Filled 2019-04-24: qty 510

## 2019-04-24 NOTE — Patient Instructions (Signed)

## 2019-04-24 NOTE — Progress Notes (Signed)
Hematology and Oncology Follow Up Visit  JOHANNE MCGLADE [id number] 1951-07-26 68 y.o. 04/24/2019   Principle Diagnosis:  Iron deficiency anemia secondary to malabsorption due to gastric bypass   Current Therapy:   IV iron as indicated    Interim History:  Ms. Fargo is here today to re-establish care. We last saw her in 2017. She is iron deficient again with saturation 10% and ferritin 9. Hgb 9.9.  She is symptomatic with fatigue, brain fog, dizziness and SOB with over exertion.  No episodes of bleeding. Her skin is thin and she does bruise easily.  No fever, chills, n/v, cough, rash, chest pain, palpitations, abdominal pain or changes in bowel or bladder habits.  She has issues with frequent UTI's and has an appointment with Urology coming up.  No swelling, tenderness, numbness or tingling in her extremities at this time.  Her appetite comes and goes. She is staying hydrated. She has lost 20 lbs since September 2019.  She has a lot of stress at home. She recently adopted her 69 yo great grandson.    ECOG Performance Status: 1 - Symptomatic but completely ambulatory  Medications:  Allergies as of 04/24/2019      Reactions   Bactrim [sulfamethoxazole-trimethoprim]    Raw and irritatated in mouth.   Darvocet [propoxyphene N-acetaminophen]    Decongestant [oxymetazoline]    Elavil [amitriptyline]    Lithium Other (See Comments)   Hx toxicity   Risperidone And Related    Ultram [tramadol]       Medication List       Accurate as of April 24, 2019  2:03 PM. If you have any questions, ask your nurse or doctor.        cyclobenzaprine 10 MG tablet Commonly known as:  FLEXERIL TAKE 1/2 TO 1 TABLET BY MOUTH TWICE A DAY AS DIRECTED   donepezil 10 MG tablet Commonly known as:  ARICEPT TAKE 1 TABLET BY MOUTH EVERY DAY   DULoxetine 30 MG capsule Commonly known as:  CYMBALTA TAKE 1 CAPSULE BY MOUTH EVERY DAY   fluticasone 50 MCG/ACT nasal spray Commonly known as:  FLONASE  Place 2 sprays into both nostrils daily.   furosemide 20 MG tablet Commonly known as:  LASIX TAKE 1 TABLET BY MOUTH EVERY DAY   gabapentin 300 MG capsule Commonly known as:  NEURONTIN TAKE 3 CAPSULE BY MOUTH THREE TIMES A DAY   lamoTRIgine 200 MG tablet Commonly known as:  LAMICTAL Take 1 tablet (200 mg total) by mouth daily.   meclizine 25 MG tablet Commonly known as:  ANTIVERT TAKE 1 TABLET (25 MG TOTAL) BY MOUTH 3 (THREE) TIMES DAILY AS NEEDED FOR DIZZINESS.   nystatin cream Commonly known as:  MYCOSTATIN APPLY 1 APPLICATION TOPICALLY 2 (TWO) TIMES DAILY.   pantoprazole 40 MG tablet Commonly known as:  PROTONIX TAKE 2 TABLETS (80 MG TOTAL) BY MOUTH DAILY.   QUEtiapine 400 MG tablet Commonly known as:  SEROQUEL TAKE 2 TABLETS (800 MG TOTAL) BY MOUTH AT BEDTIME.   QUEtiapine 200 MG tablet Commonly known as:  SEROQUEL TAKE 1 TABLET BY MOUTH EVERY DAY   rizatriptan 10 MG tablet Commonly known as:  MAXALT Take 1 tablet (10 mg total) by mouth as needed. May repeat in 2 hours if needed   Vitamin D (Ergocalciferol) 1.25 MG (50000 UT) Caps capsule Commonly known as:  DRISDOL TAKE 1 CAPSULE (50,000 UNITS TOTAL) BY MOUTH 2 (TWO) TIMES A WEEK.       Allergies:  Allergies  Allergen Reactions  . Bactrim [Sulfamethoxazole-Trimethoprim]     Raw and irritatated in mouth.  Leodis Liverpool. Darvocet [Propoxyphene N-Acetaminophen]   . Decongestant [Oxymetazoline]   . Elavil [Amitriptyline]   . Lithium Other (See Comments)    Hx toxicity  . Risperidone And Related   . Ultram [Tramadol]     Past Medical History, Surgical history, Social history, and Family History were reviewed and updated.  Review of Systems: All other 10 point review of systems is negative.   Physical Exam:  vitals were not taken for this visit.   Wt Readings from Last 3 Encounters:  03/23/19 164 lb 8 oz (74.6 kg)  12/21/18 169 lb (76.7 kg)  11/23/18 173 lb (78.5 kg)    Ocular: Sclerae unicteric, pupils  equal, round and reactive to light Ear-nose-throat: Oropharynx clear, dentition fair Lymphatic: No cervical or supraclavicular adenopathy Lungs no rales or rhonchi, good excursion bilaterally Heart regular rate and rhythm, no murmur appreciated Abd soft, nontender, positive bowel sounds, no liver or spleen tip palpated on exam, no fluid wave  MSK no focal spinal tenderness, no joint edema Neuro: non-focal, well-oriented, appropriate affect Breasts: Deferred   Lab Results  Component Value Date   WBC 6.1 04/24/2019   HGB 9.9 (L) 04/24/2019   HCT 32.8 (L) 04/24/2019   MCV 81.6 04/24/2019   PLT 239 04/24/2019   Lab Results  Component Value Date   FERRITIN 9 (L) 03/23/2019   IRON 32 03/23/2019   TIBC 305 03/23/2019   UIBC 273 03/23/2019   IRONPCTSAT 10 (L) 03/23/2019   Lab Results  Component Value Date   RETICCTPCT 1.1 04/24/2019   RBC 4.02 04/24/2019   RBC 4.02 04/24/2019   RETICCTABS 40.4 05/28/2011   Lab Results  Component Value Date   KPAFRELGTCHN 0.91 10/03/2007   LAMBDASER 1.54 10/03/2007   KAPLAMBRATIO 0.59 10/03/2007   No results found for: Loel LoftyGGSERUM, IGA, [facility] Lab Results  Component Value Date   TOTALPROTELP 5.7 (L) 05/28/2011   ALBUMINELP 53.7 (L) 05/28/2011   A1GS 7.3 (H) 05/28/2011   A2GS 12.7 (H) 05/28/2011   BETS 5.4 05/28/2011   BETA2SER 5.1 05/28/2011   GAMS 15.8 05/28/2011   MSPIKE NOT DET 05/28/2011   SPEI * 05/28/2011     Chemistry      Component Value Date/Time   NA 142 03/23/2019 1151   NA 140 01/24/2016 1335   K 4.9 03/23/2019 1151   K 4.3 01/24/2016 1335   CL 107 (H) 03/23/2019 1151   CL 97 (L) 07/15/2011 1413   CO2 21 03/23/2019 1151   CO2 21 (L) 01/24/2016 1335   BUN 14 03/23/2019 1151   BUN 15.4 01/24/2016 1335   CREATININE 1.12 (H) 03/23/2019 1151   CREATININE 1.0 01/24/2016 1335      Component Value Date/Time   CALCIUM 9.4 03/23/2019 1151   CALCIUM 8.7 01/24/2016 1335   ALKPHOS 246 (H) 03/23/2019 1151   ALKPHOS 302 (H)  01/24/2016 1335   AST 16 03/23/2019 1151   AST 16 01/24/2016 1335   ALT 11 03/23/2019 1151   ALT <9 01/24/2016 1335   BILITOT <0.2 03/23/2019 1151   BILITOT <0.30 01/24/2016 1335       Impression and Plan: Ms. Lendell CapriceSullivan is a very pleasant [age] yo caucasian female with history of iron deficiency anemia secondary to malabsorption due to gastric bypass.  She will receive Iv iron today and possibly again next week as well. We will see what her lab work today shows.  We  will plan to see her back in another 8 weeks for follow-up.  She will contact our office with any questions or concerns. We can certainly see her sooner if need be.   Emeline GinsSarah Brighten Buzzelli, NP 6/8/20202:03 PM

## 2019-04-25 ENCOUNTER — Telehealth: Payer: Self-pay | Admitting: Family

## 2019-04-25 LAB — IRON AND TIBC
Iron: 32 ug/dL — ABNORMAL LOW (ref 41–142)
Saturation Ratios: 11 % — ABNORMAL LOW (ref 21–57)
TIBC: 288 ug/dL (ref 236–444)
UIBC: 255 ug/dL (ref 120–384)

## 2019-04-25 LAB — FERRITIN: Ferritin: <4 ng/mL — ABNORMAL LOW (ref 11–307)

## 2019-04-25 LAB — LACTATE DEHYDROGENASE: LDH: 185 U/L (ref 98–192)

## 2019-04-26 ENCOUNTER — Ambulatory Visit (INDEPENDENT_AMBULATORY_CARE_PROVIDER_SITE_OTHER): Payer: Medicare Other | Admitting: Family Medicine

## 2019-04-26 ENCOUNTER — Other Ambulatory Visit: Payer: Self-pay

## 2019-04-26 ENCOUNTER — Other Ambulatory Visit: Payer: Medicare Other

## 2019-04-26 ENCOUNTER — Encounter: Payer: Self-pay | Admitting: Family Medicine

## 2019-04-26 ENCOUNTER — Ambulatory Visit: Payer: Medicare Other | Admitting: Family

## 2019-04-26 DIAGNOSIS — R2689 Other abnormalities of gait and mobility: Secondary | ICD-10-CM | POA: Diagnosis not present

## 2019-04-26 DIAGNOSIS — F339 Major depressive disorder, recurrent, unspecified: Secondary | ICD-10-CM

## 2019-04-26 DIAGNOSIS — F3170 Bipolar disorder, currently in remission, most recent episode unspecified: Secondary | ICD-10-CM

## 2019-04-26 DIAGNOSIS — R945 Abnormal results of liver function studies: Secondary | ICD-10-CM

## 2019-04-26 DIAGNOSIS — R634 Abnormal weight loss: Secondary | ICD-10-CM

## 2019-04-26 DIAGNOSIS — E538 Deficiency of other specified B group vitamins: Secondary | ICD-10-CM

## 2019-04-26 DIAGNOSIS — R41840 Attention and concentration deficit: Secondary | ICD-10-CM

## 2019-04-26 DIAGNOSIS — E785 Hyperlipidemia, unspecified: Secondary | ICD-10-CM

## 2019-04-26 DIAGNOSIS — R7989 Other specified abnormal findings of blood chemistry: Secondary | ICD-10-CM

## 2019-04-26 DIAGNOSIS — D509 Iron deficiency anemia, unspecified: Secondary | ICD-10-CM | POA: Diagnosis not present

## 2019-04-26 DIAGNOSIS — E1169 Type 2 diabetes mellitus with other specified complication: Secondary | ICD-10-CM

## 2019-04-26 DIAGNOSIS — E559 Vitamin D deficiency, unspecified: Secondary | ICD-10-CM

## 2019-04-26 NOTE — Progress Notes (Signed)
Virtual Visit Via telephone Note I connected with@ on 04/26/19 by telephone and verified that I am speaking with the correct person or authorized healthcare agent using two identifiers. Erin Donaldson is currently located at home and there are no unauthorized people in close proximity. I completed this visit while in a private location in my home .  This visit type was conducted due to national recommendations for restrictions regarding the COVID-19 Pandemic (e.g. social distancing).  This format is felt to be most appropriate for this patient at this time.  All issues noted in this document were discussed and addressed.  No physical exam was performed.    I discussed the limitations, risks, security and privacy concerns of performing an evaluation and management service by telephone and the availability of in person appointments. I also discussed with the patient that there may be a patient responsible charge related to this service. The patient expressed understanding and agreed to proceed.   Date:  04/26/2019    ID:  Erin Donaldson      [birth year]        [id number]   Patient Care Team Patient Care Team: Ernestina PennaMoore, Quaneshia Wareing W, MD as PCP - General (Family Medicine)  Reason for Visit: Primary Care Follow-up     History of Present Illness & Review of Systems:     Erin Donaldson is a 68 y.o. year old female primary care patient that presents today for a telehealth visit.  The patient is pleasant and alert.  She is currently seeing Dr. Myna HidalgoEnnever and his nurse practitioner.  She has severe iron deficiency and has received an iron infusion this past week because of low serum iron levels and decreased hemoglobin.  Have plans for her to come back in another week for a repeat iron infusion hoping this can help her feel better and have more energy and not be as short winded.  She otherwise denies any chest pain.  She has some shortness of breath with exertion and is taking pantoprazole twice daily for  her stomach.  She denies any trouble with swallowing heartburn or indigestion with the pantoprazole.  Her stools are normal and formed.  She has a future [facility] [place] because of recurrent urinary tract infections.  Review of systems as stated, otherwise negative.  The patient does not have symptoms concerning for COVID-19 infection (fever, chills, cough, or new shortness of breath).      Current Medications (Verified) Allergies as of 04/26/2019      Reactions   Bactrim [sulfamethoxazole-trimethoprim]    Raw and irritatated in mouth.   Darvocet [propoxyphene N-acetaminophen]    Decongestant [oxymetazoline]    Elavil [amitriptyline]    Lithium Other (See Comments)   Hx toxicity   Risperidone And Related    Ultram [tramadol]       Medication List       Accurate as of April 26, 2019  2:25 PM. If you have any questions, ask your nurse or doctor.        cyclobenzaprine 10 MG tablet Commonly known as:  FLEXERIL TAKE 1/2 TO 1 TABLET BY MOUTH TWICE A DAY AS DIRECTED   donepezil 10 MG tablet Commonly known as:  ARICEPT TAKE 1 TABLET BY MOUTH EVERY DAY   DULoxetine 30 MG capsule Commonly known as:  CYMBALTA TAKE 1 CAPSULE BY MOUTH EVERY DAY   fluticasone 50 MCG/ACT nasal spray Commonly known as:  FLONASE Place 2 sprays  into both nostrils daily.   furosemide 20 MG tablet Commonly known as:  LASIX TAKE 1 TABLET BY MOUTH EVERY DAY   gabapentin 300 MG capsule Commonly known as:  NEURONTIN TAKE 3 CAPSULE BY MOUTH THREE TIMES A DAY   lamoTRIgine 200 MG tablet Commonly known as:  LAMICTAL Take 1 tablet (200 mg total) by mouth daily.   meclizine 25 MG tablet Commonly known as:  ANTIVERT TAKE 1 TABLET (25 MG TOTAL) BY MOUTH 3 (THREE) TIMES DAILY AS NEEDED FOR DIZZINESS.   nystatin cream Commonly known as:  MYCOSTATIN APPLY 1 APPLICATION TOPICALLY 2 (TWO) TIMES DAILY.   pantoprazole 40 MG tablet Commonly known as:  PROTONIX TAKE 2  TABLETS (80 MG TOTAL) BY MOUTH DAILY.   QUEtiapine 400 MG tablet Commonly known as:  SEROQUEL TAKE 2 TABLETS (800 MG TOTAL) BY MOUTH AT BEDTIME.   QUEtiapine 200 MG tablet Commonly known as:  SEROQUEL TAKE 1 TABLET BY MOUTH EVERY DAY   rizatriptan 10 MG tablet Commonly known as:  MAXALT Take 1 tablet (10 mg total) by mouth as needed. May repeat in 2 hours if needed   Vitamin D (Ergocalciferol) 1.25 MG (50000 UT) Caps capsule Commonly known as:  DRISDOL TAKE 1 CAPSULE (50,000 UNITS TOTAL) BY MOUTH 2 (TWO) TIMES A WEEK.           Allergies (Verified)    Bactrim [sulfamethoxazole-trimethoprim]; Darvocet [propoxyphene n-acetaminophen]; Decongestant [oxymetazoline]; Elavil [amitriptyline]; Lithium; Risperidone and related; and Ultram [tramadol]  Past Medical History Past Medical History:  Diagnosis Date  . Anemia   . Anxiety   . Bipolar affective (HCC)   . Depression   . Diabetes in pregnancy   . Diabetes mellitus   . Elevated LFTs   . GERD (gastroesophageal reflux disease)   . Hyperlipidemia   . Hypertension   . IBS (irritable bowel syndrome)   . Migraine      Past Surgical History:  Procedure Laterality Date  . CHOLECYSTECTOMY    . GASTRIC BYPASS  2005    Social History   Socioeconomic History  . Marital status: Widowed    Spouse name: Not on file  . Number of children: 1  . Years of education: college  . Highest education level: Not on file  Occupational History  . Occupation: Disabled    Comment: [employer]   Social Needs  . Financial resource strain: Not on file  . Food insecurity:    Worry: Not on file    Inability: Not on file  . Transportation needs:    Medical: Not on file    Non-medical: Not on file  Tobacco Use  . Smoking status: Never Smoker  . Smokeless tobacco: Never Used  Substance and Sexual Activity  . Alcohol use: No    Alcohol/week: 0.0 standard drinks    Comment: rare  . Drug use: No  . Sexual activity: Not on file  Lifestyle  .  Physical activity:    Days per week: Not on file    Minutes per session: Not on file  . Stress: Not on file  Relationships  . Social connections:    Talks on phone: Not on file    Gets together: Not on file    Attends religious service: Not on file    Active member of club or organization: Not on file    Attends meetings of clubs or organizations: Not on file    Relationship status: Not on file  Other Topics Concern  . Not on  file  Social History Narrative   Lives with great grandson.       Family History  Problem Relation Age of Onset  . Kidney disease Father   . Heart disease Father 79       CABG  . Diabetes Father   . CAD Father   . Heart disease Mother 7       CAD  . Breast cancer Mother   . Cancer Mother        bladder  . Heart disease Maternal Grandfather   . Heart attack Maternal Grandfather   . Stroke Maternal Grandfather   . Heart disease Brother   . Dementia Brother   . Heart disease Maternal Aunt   . Heart disease Maternal Uncle   . Heart disease Paternal Aunt   . Heart disease Paternal Uncle   . Diabetes Maternal Aunt   . Diabetes Maternal Uncle   . Diabetes Paternal Aunt   . Diabetes Paternal Uncle   . Stroke Maternal Grandmother   . Heart disease Paternal Grandfather   . Diabetes Daughter   . Drug abuse Daughter   . Depression Sister   . Hypertension Sister   . Alcohol abuse Brother   . Drug abuse Brother   . Other Brother        accident caused brain swelling   . Colon cancer Neg Hx       Labs/Other Tests and Data Reviewed:    Wt Readings from Last 3 Encounters:  04/24/19 157 lb (71.2 kg)  03/23/19 164 lb 8 oz (74.6 kg)  12/21/18 169 lb (76.7 kg)   Temp Readings from Last 3 Encounters:  03/23/19 (!) 97.4 F (36.3 C) (Oral)  12/21/18 97.9 F (36.6 C) (Oral)   BP Readings from Last 3 Encounters:  04/24/19 119/66  04/24/19 117/81  03/23/19 111/73   Pulse Readings from Last 3 Encounters:  04/24/19 79  04/24/19 83  03/23/19 95      Lab Results  Component Value Date   HGBA1C 7.0 (H) 01/10/2019   HGBA1C 7.8 (H) 08/09/2018   HGBA1C 7.1 (H) 04/04/2018   Lab Results  Component Value Date   MICROALBUR neg 06/08/2014   LDLCALC 83 01/10/2019   CREATININE 1.21 (H) 04/24/2019       Chemistry      Component Value Date/Time   NA 143 04/24/2019 1337   NA 142 03/23/2019 1151   NA 140 01/24/2016 1335   K 3.8 04/24/2019 1337   K 4.3 01/24/2016 1335   CL 108 04/24/2019 1337   CL 97 (L) 07/15/2011 1413   CO2 27 04/24/2019 1337   CO2 21 (L) 01/24/2016 1335   BUN 16 04/24/2019 1337   BUN 14 03/23/2019 1151   BUN 15.4 01/24/2016 1335   CREATININE 1.21 (H) 04/24/2019 1337   CREATININE 1.0 01/24/2016 1335      Component Value Date/Time   CALCIUM 9.0 04/24/2019 1337   CALCIUM 8.7 01/24/2016 1335   ALKPHOS 199 (H) 04/24/2019 1337   ALKPHOS 302 (H) 01/24/2016 1335   AST 11 (L) 04/24/2019 1337   AST 16 01/24/2016 1335   ALT 10 04/24/2019 1337   ALT <9 01/24/2016 1335   BILITOT 0.3 04/24/2019 1337   BILITOT <0.30 01/24/2016 1335         OBSERVATIONS/ OBJECTIVE:     The patient is calm and alert and does not appear to be short of breath over the phone.  Her blood pressures are no higher anytime than  120 and no higher than 80 for the diastolic.  Her blood pressure generally run between 96/54 and up to less than 120 and less than 80.  Her weight is about 148.5 today.  Early in January her weight was 180.  She says that she feels like stress is played a role with her weight loss as she is having to be a grandmother and great-grandmother and this is been very stressful on her especially with iron deficiency anemia.  She has trouble with her balance and she feels foggy with her memory.  Hopefully with getting the iron under better control she can start feeling better and she will continue to follow-up with Dr. Myna HidalgoEnnever.   Physical exam deferred due to nature of telephonic visit.  ASSESSMENT & PLAN    Time:    Today, I have spent 25 minutes with the patient via telephone discussing the above including Covid precautions.     Visit Diagnoses: 1. Iron deficiency anemia, unspecified iron deficiency anemia type -Continue to follow-up with Dr. Myna HidalgoEnnever and get iron infusions  2. Bipolar affective disorder in remission (HCC) -Continue to follow-up with psychiatry  3. Folate deficiency -Continue with iron infusions and monitoring iron level  4. Loss of balance -Continue to be careful not put self at risk for falling and get blood work as recommended by hematology  5. Difficulty concentrating -Relieve stress as much as possible get assistance with care of great-grandchild if possible  6. Type 2 diabetes mellitus with hyperlipidemia (HCC) -Check blood sugars regularly and keep feet checked.  Continue current treatment pending results of lab work  7. Weight loss -Get labs as planned in the office.  Call nurse and find out the best time to come for lab work  8. Depression, recurrent (HCC) -This seems to be stable with current treatment, if we can get her physically better this will help the depression and malaise also.  Patient Instructions  Continue follow-up with hematology Continue current medicines  Follow-up with hematology and psychiatry as planned Follow-up with urology as planned Continue to drink plenty of fluids and stay well-hydrated Continue to practice good hand and respiratory hygiene     The above assessment and management plan was discussed with the patient. The patient verbalized understanding of and has agreed to the management plan. Patient is aware to call the clinic if symptoms persist or worsen. Patient is aware when to return to the clinic for a follow-up visit. Patient educated on when it is appropriate to go to the emergency department.    Ernestina Pennaonald W. Hades Mathew, MD [facility] [address], [place], [place] [id number] Ph [phone]   Nyra Capeson W. Obie Kallenbach MD

## 2019-04-26 NOTE — Patient Instructions (Addendum)
Continue follow-up with hematology Continue current medicines  Follow-up with hematology and psychiatry as planned Follow-up with urology as planned Continue to drink plenty of fluids and stay well-hydrated Continue to practice good hand and respiratory hygiene

## 2019-04-26 NOTE — Addendum Note (Signed)
Addended by: Zannie Cove on: 04/26/2019 03:15 PM   Modules accepted: Orders

## 2019-04-28 ENCOUNTER — Ambulatory Visit (INDEPENDENT_AMBULATORY_CARE_PROVIDER_SITE_OTHER): Payer: Medicare Other | Admitting: Urology

## 2019-04-28 ENCOUNTER — Other Ambulatory Visit (HOSPITAL_COMMUNITY)
Admission: RE | Admit: 2019-04-28 | Discharge: 2019-04-28 | Disposition: A | Discharge status: Home / Self Care | Payer: Medicare Other | Source: Ambulatory Visit | Attending: Urology | Admitting: Urology

## 2019-04-28 DIAGNOSIS — N952 Postmenopausal atrophic vaginitis: Secondary | ICD-10-CM

## 2019-04-28 DIAGNOSIS — N3020 Other chronic cystitis without hematuria: Secondary | ICD-10-CM

## 2019-04-28 LAB — URINALYSIS, COMPLETE (UACMP) WITH MICROSCOPIC
Bacteria, UA: NONE SEEN
Bilirubin Urine: NEGATIVE
Glucose, UA: NEGATIVE mg/dL
Hgb urine dipstick: NEGATIVE
Ketones, ur: NEGATIVE mg/dL
Leukocytes,Ua: NEGATIVE
Nitrite: NEGATIVE
Protein, ur: NEGATIVE mg/dL
Specific Gravity, Urine: 1.006 (ref 1.005–1.030)
pH: 6 (ref 5.0–8.0)

## 2019-04-30 LAB — URINE CULTURE: Culture: NO GROWTH

## 2019-05-02 ENCOUNTER — Ambulatory Visit: Payer: Medicare Other

## 2019-05-03 ENCOUNTER — Ambulatory Visit: Payer: Medicare Other

## 2019-05-04 ENCOUNTER — Ambulatory Visit: Payer: Medicare Other

## 2019-05-10 ENCOUNTER — Inpatient Hospital Stay: Payer: Medicare Other

## 2019-05-10 ENCOUNTER — Other Ambulatory Visit: Payer: Self-pay

## 2019-05-10 VITALS — BP 100/55 | HR 100 | Temp 98.2°F | Resp 18

## 2019-05-10 DIAGNOSIS — D509 Iron deficiency anemia, unspecified: Secondary | ICD-10-CM

## 2019-05-10 DIAGNOSIS — Z9884 Bariatric surgery status: Secondary | ICD-10-CM | POA: Diagnosis not present

## 2019-05-10 DIAGNOSIS — K909 Intestinal malabsorption, unspecified: Secondary | ICD-10-CM | POA: Diagnosis not present

## 2019-05-10 MED ORDER — SODIUM CHLORIDE 0.9 % IV SOLN
INTRAVENOUS | Status: DC
  Filled 2019-05-10: qty 250

## 2019-05-10 MED ORDER — SODIUM CHLORIDE 0.9 % IV SOLN
510.0000 mg | Freq: Once | INTRAVENOUS | Status: AC
  Administered 2019-05-10: 510 mg via INTRAVENOUS
  Filled 2019-05-10: qty 510

## 2019-05-10 MED ORDER — SODIUM CHLORIDE 0.9 % IV SOLN
Freq: Once | INTRAVENOUS | Status: AC
  Administered 2019-05-10: 13:00:00 via INTRAVENOUS
  Filled 2019-05-10: qty 250

## 2019-05-10 NOTE — Progress Notes (Signed)
Patient iron infusion complete blood pressure 87/51. Dr. Marin Olp notified. Order given and carried out for 500 mL normal saline over 30 minutes per Dr. Marin Olp.

## 2019-05-10 NOTE — Patient Instructions (Signed)

## 2019-05-11 ENCOUNTER — Telehealth: Payer: Self-pay | Admitting: Family Medicine

## 2019-05-11 NOTE — Telephone Encounter (Signed)
Pt called - aware lab orders are there waiting on her.

## 2019-05-14 DIAGNOSIS — Z9884 Bariatric surgery status: Secondary | ICD-10-CM | POA: Diagnosis not present

## 2019-05-14 DIAGNOSIS — Z882 Allergy status to sulfonamides status: Secondary | ICD-10-CM | POA: Diagnosis not present

## 2019-05-14 DIAGNOSIS — N390 Urinary tract infection, site not specified: Secondary | ICD-10-CM | POA: Diagnosis not present

## 2019-05-14 DIAGNOSIS — E876 Hypokalemia: Secondary | ICD-10-CM | POA: Diagnosis not present

## 2019-05-14 DIAGNOSIS — Z79899 Other long term (current) drug therapy: Secondary | ICD-10-CM | POA: Diagnosis not present

## 2019-05-14 DIAGNOSIS — Z0389 Encounter for observation for other suspected diseases and conditions ruled out: Secondary | ICD-10-CM | POA: Diagnosis not present

## 2019-05-14 DIAGNOSIS — Z9049 Acquired absence of other specified parts of digestive tract: Secondary | ICD-10-CM | POA: Diagnosis not present

## 2019-05-14 DIAGNOSIS — R531 Weakness: Secondary | ICD-10-CM | POA: Diagnosis not present

## 2019-05-14 DIAGNOSIS — Z5329 Procedure and treatment not carried out because of patient's decision for other reasons: Secondary | ICD-10-CM | POA: Diagnosis not present

## 2019-05-14 DIAGNOSIS — R0902 Hypoxemia: Secondary | ICD-10-CM | POA: Diagnosis not present

## 2019-05-14 DIAGNOSIS — G309 Alzheimer's disease, unspecified: Secondary | ICD-10-CM | POA: Diagnosis not present

## 2019-05-14 DIAGNOSIS — R0602 Shortness of breath: Secondary | ICD-10-CM | POA: Diagnosis not present

## 2019-05-16 ENCOUNTER — Telehealth: Payer: Self-pay | Admitting: Family Medicine

## 2019-05-16 NOTE — Telephone Encounter (Signed)
Spoke with pt regarding lab results appt scheduled to discuss dizziness

## 2019-05-17 ENCOUNTER — Other Ambulatory Visit: Payer: Self-pay

## 2019-05-17 ENCOUNTER — Ambulatory Visit (INDEPENDENT_AMBULATORY_CARE_PROVIDER_SITE_OTHER): Payer: Medicare Other | Admitting: Family

## 2019-05-17 ENCOUNTER — Encounter: Payer: Self-pay | Admitting: Family

## 2019-05-17 VITALS — BP 131/80 | HR 103 | Temp 97.0°F | Ht 64.0 in | Wt 153.4 lb

## 2019-05-17 DIAGNOSIS — Z8249 Family history of ischemic heart disease and other diseases of the circulatory system: Secondary | ICD-10-CM

## 2019-05-17 DIAGNOSIS — R42 Dizziness and giddiness: Secondary | ICD-10-CM

## 2019-05-17 DIAGNOSIS — D509 Iron deficiency anemia, unspecified: Secondary | ICD-10-CM

## 2019-05-17 DIAGNOSIS — R531 Weakness: Secondary | ICD-10-CM

## 2019-05-17 DIAGNOSIS — E876 Hypokalemia: Secondary | ICD-10-CM

## 2019-05-17 DIAGNOSIS — B370 Candidal stomatitis: Secondary | ICD-10-CM

## 2019-05-17 DIAGNOSIS — E611 Iron deficiency: Secondary | ICD-10-CM | POA: Diagnosis not present

## 2019-05-17 DIAGNOSIS — Z09 Encounter for follow-up examination after completed treatment for conditions other than malignant neoplasm: Secondary | ICD-10-CM | POA: Diagnosis not present

## 2019-05-17 MED ORDER — NYSTATIN 100000 UNIT/ML MT SUSP: 5.0000 mL | Freq: Four times a day (QID) | OROMUCOSAL | 1 refills | Status: DC

## 2019-05-17 NOTE — Progress Notes (Signed)
Subjective:    Patient ID: Erin Donaldson, female    DOB: 28-Jun-1951, 68 y.o.   MRN: [id number]  Chief Complaint  Patient presents with  . Dizziness   PT presents to the office today recurrent dizziness. She is followed by Oncologists for iron deficiency and is currently getting iron infusions. She went to the ED at [facility] on  05/14/19 for weakness, hypokalemia, and dehydrated. She had a negative CT chest for PE and negative CT head.   She is followed by [facility] every annually for Bipolar.  Dizziness This is a chronic problem. The current episode started more than 1 year ago. The problem occurs intermittently. The problem has been unchanged. Associated symptoms include fatigue. The symptoms are aggravated by bending.      Review of Systems  Constitutional: Positive for fatigue.  Neurological: Positive for dizziness.  All other systems reviewed and are negative.      Objective:   Physical Exam Vitals signs reviewed.  Constitutional:      General: She is not in acute distress.    Appearance: She is well-developed.  HENT:     Head: Normocephalic and atraumatic.  Eyes:     Pupils: Pupils are equal, round, and reactive to light.  Neck:     Musculoskeletal: Normal range of motion and neck supple.     Thyroid: No thyromegaly.  Cardiovascular:     Rate and Rhythm: Normal rate and regular rhythm.     Heart sounds: Normal heart sounds. No murmur.  Pulmonary:     Effort: Pulmonary effort is normal. No respiratory distress.     Breath sounds: Normal breath sounds. No wheezing.  Abdominal:     General: Bowel sounds are normal. There is no distension.     Palpations: Abdomen is soft.     Tenderness: There is no abdominal tenderness.  Musculoskeletal: Normal range of motion.        General: No tenderness.  Skin:    General: Skin is warm and dry.  Neurological:     Mental Status: She is alert and oriented to person, place, and time.     Cranial Nerves: No  cranial nerve deficit.     Deep Tendon Reflexes: Reflexes are normal and symmetric.  Psychiatric:        Behavior: Behavior normal.        Thought Content: Thought content normal.        Judgment: Judgment normal.     BP 131/80   Pulse (!) 103   Temp (!) 97 F (36.1 C) (Oral)   Ht '5\' 4"'  (1.626 m)   Wt 153 lb 6.4 oz (69.6 kg)   BMI 26.33 kg/m      Assessment & Plan:  Erin Donaldson comes in today with chief complaint of Dizziness   Diagnosis and orders addressed:  1. Dizziness - BMP8+EGFR  2. Weakness - BMP8+EGFR - Anemia Profile B - Ambulatory referral to Cardiology  3. Iron deficiency - BMP8+EGFR - Anemia Profile B  4. Hypokalemia - BMP8+EGFR  5. Hospital discharge follow-up - BMP8+EGFR  6. Oral thrush Rinse after meals - nystatin (MYCOSTATIN) 100000 UNIT/ML suspension; Take 5 mLs (500,000 Units total) by mouth 4 (four) times daily.  Dispense: 473 mL; Refill: 1  7. Iron deficiency anemia, unspecified iron deficiency anemia type Keep all Hematologists appt  8. Family history of cardiovascular disease - Ambulatory referral to Cardiology   Labs pending Health Maintenance reviewed Diet and exercise encouraged  Follow  up plan: Keep follow up with PCP and keep Hematologists appt    Erin Dun, FNP

## 2019-05-17 NOTE — Patient Instructions (Signed)
Weakness °Weakness is a lack of strength. You may feel weak all over your body (generalized), or you may feel weak in one specific part of your body (focal). Common causes of weakness include: °· Infection and immune system disorders. °· Physical exhaustion. °· Internal bleeding or other blood loss that results in a lack of red blood cells (anemia). °· Dehydration. °· An imbalance in mineral (electrolyte) levels, such as potassium. °· Heart disease, circulation problems, or stroke. °Other causes include: °· Some medicines or cancer treatment. °· Stress, anxiety, or depression. °· Nervous system disorders. °· Thyroid disorders. °· Loss of muscle strength because of age or inactivity. °· Poor sleep quality or sleep disorders. °The cause of your weakness may not be known. Some causes of weakness can be serious, so it is important to see your health care provider. °Follow these instructions at home: °Activity °· Rest as needed. °· Try to get enough sleep. Most adults need 7-8 hours of quality sleep each night. Talk to your health care provider about how much sleep you need each night. °· Do exercises, such as arm curls and leg raises, for 30 minutes at least 2 days a week or as told by your health care provider. This helps build muscle strength. °· Consider working with a physical therapist or trainer who can develop an exercise plan to help you gain muscle strength. °General instructions ° °· Take over-the-counter and prescription medicines only as told by your health care provider. °· Eat a healthy, well-balanced diet. This includes: °? Proteins to build muscles, such as lean meats and fish. °? Fresh fruits and vegetables. °? Carbohydrates to boost energy, such as whole grains. °· Drink enough fluid to keep your urine pale yellow. °· Keep all follow-up visits as told by your health care provider. This is important. °Contact a health care provider if your weakness: °· Does not improve or gets worse. °· Affects your  ability to think clearly. °· Affects your ability to do your normal daily activities. °Get help right away if you: °· Develop sudden weakness, especially on one side of your face or body. °· Have chest pain. °· Have trouble breathing or shortness of breath. °· Have problems with your vision. °· Have trouble talking or swallowing. °· Have trouble standing or walking. °· Are light-headed or lose consciousness. °Summary °· Weakness is a lack of strength. You may feel weak all over your body or just in one specific part of your body. °· Weakness can be caused by a variety of things. In some cases, the cause may be unknown. °· Rest as needed, and try to get enough sleep. Most adults need 7-8 hours of quality sleep each night. °· Eat a healthy, well-balanced diet. °This information is not intended to replace advice given to you by your health care provider. Make sure you discuss any questions you have with your health care provider. °Document Released: 11/02/2005 Document Revised: 06/08/2018 Document Reviewed: 06/08/2018 °Elsevier Patient Education © 2020 [employer] Inc[employer] ° °

## 2019-05-18 ENCOUNTER — Other Ambulatory Visit: Payer: Self-pay | Admitting: Family

## 2019-05-18 DIAGNOSIS — E538 Deficiency of other specified B group vitamins: Secondary | ICD-10-CM

## 2019-05-18 DIAGNOSIS — D509 Iron deficiency anemia, unspecified: Secondary | ICD-10-CM

## 2019-05-18 LAB — BMP8+EGFR
BUN/Creatinine Ratio: 14 (ref 12–28)
BUN: 12 mg/dL (ref 8–27)
CO2: 17 mmol/L — ABNORMAL LOW (ref 20–29)
Calcium: 8.6 mg/dL — ABNORMAL LOW (ref 8.7–10.3)
Chloride: 105 mmol/L (ref 96–106)
Creatinine, Ser: 0.85 mg/dL (ref 0.57–1.00)
GFR calc Af Amer: 83 mL/min/{1.73_m2} (ref >59)
GFR calc non Af Amer: 72 mL/min/{1.73_m2} (ref >59)
Glucose: 171 mg/dL — ABNORMAL HIGH (ref 65–99)
Potassium: 3.9 mmol/L (ref 3.5–5.2)
Sodium: 138 mmol/L (ref 134–144)

## 2019-05-18 LAB — ANEMIA PROFILE B
Basophils Absolute: 0 10*3/uL (ref 0.0–0.2)
Basos: 0 %
EOS (ABSOLUTE): 0.1 10*3/uL (ref 0.0–0.4)
Eos: 1 %
Ferritin: 805 ng/mL — ABNORMAL HIGH (ref 15–150)
Folate: 5.8 ng/mL (ref >3.0)
Hematocrit: 31.1 % — ABNORMAL LOW (ref 34.0–46.6)
Hemoglobin: 9.6 g/dL — ABNORMAL LOW (ref 11.1–15.9)
Immature Grans (Abs): 0.1 10*3/uL (ref 0.0–0.1)
Immature Granulocytes: 1 %
Iron Saturation: 13 % — ABNORMAL LOW (ref 15–55)
Iron: 20 ug/dL — ABNORMAL LOW (ref 27–139)
Lymphocytes Absolute: 0.8 10*3/uL (ref 0.7–3.1)
Lymphs: 10 %
MCH: 25.9 pg — ABNORMAL LOW (ref 26.6–33.0)
MCHC: 30.9 g/dL — ABNORMAL LOW (ref 31.5–35.7)
MCV: 84 fL (ref 79–97)
Monocytes Absolute: 0.7 10*3/uL (ref 0.1–0.9)
Monocytes: 10 %
Neutrophils Absolute: 6 10*3/uL (ref 1.4–7.0)
Neutrophils: 78 %
Platelets: 286 10*3/uL (ref 150–450)
RBC: 3.7 x10E6/uL — ABNORMAL LOW (ref 3.77–5.28)
RDW: 19.8 % — ABNORMAL HIGH (ref 11.7–15.4)
Retic Ct Pct: 1 % (ref 0.6–2.6)
Total Iron Binding Capacity: 151 ug/dL — ABNORMAL LOW (ref 250–450)
UIBC: 131 ug/dL (ref 118–369)
Vitamin B-12: 228 pg/mL — ABNORMAL LOW (ref 232–1245)
WBC: 7.7 10*3/uL (ref 3.4–10.8)

## 2019-05-22 ENCOUNTER — Other Ambulatory Visit: Payer: Self-pay | Admitting: Family Medicine

## 2019-05-22 ENCOUNTER — Ambulatory Visit (INDEPENDENT_AMBULATORY_CARE_PROVIDER_SITE_OTHER): Payer: Medicare Other | Admitting: *Deleted

## 2019-05-22 ENCOUNTER — Other Ambulatory Visit: Payer: Self-pay

## 2019-05-22 DIAGNOSIS — E538 Deficiency of other specified B group vitamins: Secondary | ICD-10-CM | POA: Diagnosis not present

## 2019-05-22 MED ORDER — CYANOCOBALAMIN 1000 MCG/ML IJ SOLN
1000.0000 ug | INTRAMUSCULAR | Status: AC
  Administered 2019-05-22 – 2019-05-29 (×2): 1000 ug via INTRAMUSCULAR

## 2019-05-22 NOTE — Progress Notes (Signed)
B12 shot given right deltoid Pt tolerated

## 2019-05-26 ENCOUNTER — Other Ambulatory Visit: Payer: Self-pay

## 2019-05-29 ENCOUNTER — Other Ambulatory Visit: Payer: Self-pay

## 2019-05-29 ENCOUNTER — Ambulatory Visit (INDEPENDENT_AMBULATORY_CARE_PROVIDER_SITE_OTHER): Payer: Medicare Other | Admitting: *Deleted

## 2019-05-29 DIAGNOSIS — E538 Deficiency of other specified B group vitamins: Secondary | ICD-10-CM

## 2019-05-29 NOTE — Progress Notes (Signed)
Pt given Cyanocobalamin inj Tolerated well 

## 2019-05-30 ENCOUNTER — Encounter: Payer: Self-pay | Admitting: Internal Medicine

## 2019-06-09 ENCOUNTER — Other Ambulatory Visit: Payer: Self-pay | Admitting: Psychiatry

## 2019-06-09 ENCOUNTER — Other Ambulatory Visit: Payer: Self-pay | Admitting: Family Medicine

## 2019-06-09 DIAGNOSIS — M549 Dorsalgia, unspecified: Secondary | ICD-10-CM

## 2019-06-19 ENCOUNTER — Telehealth: Payer: Self-pay | Admitting: Psychiatry

## 2019-06-19 ENCOUNTER — Other Ambulatory Visit: Payer: Self-pay | Admitting: Psychiatry

## 2019-06-19 MED ORDER — QUETIAPINE FUMARATE 400 MG PO TABS: 400.0000 mg | ORAL_TABLET | Freq: Every day | ORAL | 0 refills | Status: DC

## 2019-06-19 NOTE — Telephone Encounter (Signed)
Pt request Seroquel Rx to [facility] in [place]. [facility] too expensive

## 2019-06-19 NOTE — Telephone Encounter (Signed)
90 days of quetiapine per [facility] will be over $400.  It will be dramatically cheaper at either [facility] or [facility].
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
## 2019-06-19 NOTE — Telephone Encounter (Signed)
Yes ok to change. Thanks

## 2019-06-19 NOTE — Telephone Encounter (Signed)
I have 2 unopened bottles of #60 quetiapine 300 mg tablets.  She can pick them up and would need to take 1-1/2 daily to get 450 mg each evening.  The other option as she can get a 90-day supply of quetiapine 400 mg through [facility] at either [facility] or [facility] for about $30.  She would need to use [facility] to get that price.  She does need to stay on the medication.  Let us know which of those options she would prefer.
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
## 2019-06-20 ENCOUNTER — Other Ambulatory Visit: Payer: Self-pay

## 2019-06-20 MED ORDER — QUETIAPINE FUMARATE 400 MG PO TABS: 800.0000 mg | ORAL_TABLET | Freq: Every day | ORAL | 0 refills | Status: DC

## 2019-06-26 ENCOUNTER — Ambulatory Visit (INDEPENDENT_AMBULATORY_CARE_PROVIDER_SITE_OTHER): Payer: Medicare Other | Admitting: Physician Assistant

## 2019-06-26 ENCOUNTER — Encounter: Payer: Self-pay | Admitting: Physician Assistant

## 2019-06-26 ENCOUNTER — Ambulatory Visit: Payer: Medicare Other | Admitting: Hematology & Oncology

## 2019-06-26 ENCOUNTER — Other Ambulatory Visit: Payer: Medicare Other

## 2019-06-26 DIAGNOSIS — N3000 Acute cystitis without hematuria: Secondary | ICD-10-CM | POA: Diagnosis not present

## 2019-06-26 MED ORDER — FLUCONAZOLE 150 MG PO TABS: ORAL_TABLET | ORAL | 0 refills | Status: DC

## 2019-06-26 MED ORDER — CEPHALEXIN 500 MG PO CAPS: 500.0000 mg | ORAL_CAPSULE | Freq: Two times a day (BID) | ORAL | 0 refills | Status: DC

## 2019-06-26 NOTE — Progress Notes (Signed)
Erin Donaldson.aj     Telephone visit  Subjective: CC:uti PCP: Raliegh IpGottschalk, Ashly M, DO WUJ:[id number]:Erin Donaldson is a 68 y.o. female calls for telephone consult today. Patient provides verbal consent for consult held via phone.  Patient is identified with 2 separate identifiers.  At this time the entire area is on COVID-19 social distancing and stay home orders are in place.  Patient is of higher risk and therefore we are performing this by a virtual method.  Location of patient: home Location of provider: [facility] Others present for call: no  This patient has had 7 days of dysuria, frequency and nocturia. There is also pain over the bladder in the suprapubic region, no back pain. Denies leakage or hematuria.  Denies fever or chills. No pain in flank area. Cloudy urine.  She does frequently get these urinary tract infections.  She states the last time she did use Keflex and it did well with her.  In addition she does get a yeast infection whenever she takes an antibiotic.  Patient was also asking about changing her Maxalt to Midrin.  She states that the Maxalt is very expensive and she is only able to get 6 tablets of it.  I instructed her that I will send this information on to Dr. Nadine CountsGottschalk to evaluate and let her know if this can be done.   ROS: Per HPI  Allergies  Allergen Reactions  . Bactrim [Sulfamethoxazole-Trimethoprim]     Raw and irritatated in mouth.  Leodis [place]. Darvocet [Propoxyphene N-Acetaminophen]   . Decongestant [Oxymetazoline]   . Elavil [Amitriptyline]   . Lithium Other (See Comments)    Hx toxicity  . Risperidone And Related   . Ultram [Tramadol]    Past Medical History:  Diagnosis Date  . Anemia   . Anxiety   . Bipolar affective (HCC)   . Depression   . Diabetes in pregnancy   . Diabetes mellitus   . Elevated LFTs   . GERD (gastroesophageal reflux disease)   . Hyperlipidemia   . Hypertension   . IBS (irritable bowel syndrome)   . Migraine     Current Outpatient  Medications:  .  cephALEXin (KEFLEX) 500 MG capsule, Take 1 capsule (500 mg total) by mouth 2 (two) times daily., Disp: 20 capsule, Rfl: 0 .  cyclobenzaprine (FLEXERIL) 10 MG tablet, TAKE 1/2 TO 1 TABLET BY MOUTH TWICE A DAY AS DIRECTED, Disp: 30 tablet, Rfl: 2 .  donepezil (ARICEPT) 10 MG tablet, TAKE 1 TABLET BY MOUTH EVERY DAY, Disp: 90 tablet, Rfl: 1 .  DULoxetine (CYMBALTA) 30 MG capsule, TAKE 1 CAPSULE BY MOUTH EVERY DAY, Disp: 90 capsule, Rfl: 1 .  fluconazole (DIFLUCAN) 150 MG tablet, 1 po q week x 4 weeks, Disp: 4 tablet, Rfl: 0 .  fluticasone (FLONASE) 50 MCG/ACT nasal spray, Place 2 sprays into both nostrils daily., Disp: 48 g, Rfl: 1 .  furosemide (LASIX) 20 MG tablet, , Disp: , Rfl:  .  gabapentin (NEURONTIN) 300 MG capsule, , Disp: , Rfl:  .  lamoTRIgine (LAMICTAL) 200 MG tablet, TAKE 1 TABLET BY MOUTH EVERY DAY, Disp: 90 tablet, Rfl: 1 .  meclizine (ANTIVERT) 25 MG tablet, TAKE 1 TABLET (25 MG TOTAL) BY MOUTH 3 (THREE) TIMES DAILY AS NEEDED FOR DIZZINESS., Disp: 30 tablet, Rfl: 0 .  nystatin (MYCOSTATIN) 100000 UNIT/ML suspension, Take 5 mLs (500,000 Units total) by mouth 4 (four) times daily., Disp: 473 mL, Rfl: 1 .  nystatin cream (MYCOSTATIN), APPLY 1 APPLICATION TOPICALLY 2 (TWO)  TIMES DAILY., Disp: 30 g, Rfl: 2 .  pantoprazole (PROTONIX) 40 MG tablet, TAKE 2 TABLETS (80 MG TOTAL) BY MOUTH DAILY., Disp: 180 tablet, Rfl: 0 .  potassium chloride SA (K-DUR) 20 MEQ tablet, Take 20 mEq by mouth once., Disp: , Rfl:  .  QUEtiapine (SEROQUEL) 200 MG tablet, , Disp: , Rfl:  .  QUEtiapine (SEROQUEL) 400 MG tablet, Take 2 tablets (800 mg total) by mouth at bedtime., Disp: 180 tablet, Rfl: 0 .  rizatriptan (MAXALT) 10 MG tablet, Take 1 tablet (10 mg total) by mouth as needed. May repeat in 2 hours if needed, Disp: 10 tablet, Rfl: 5 .  Vitamin D, Ergocalciferol, (DRISDOL) 1.25 MG (50000 UT) CAPS capsule, TAKE 1 CAPSULE (50,000 UNITS TOTAL) BY MOUTH 2 (TWO) TIMES A WEEK., Disp: , Rfl: 3   Assessment/ Plan: 68 y.o. female   1. Acute cystitis without hematuria 1. Acute cystitis without hematuria - cephALEXin (KEFLEX) 500 MG capsule; Take 1 capsule (500 mg total) by mouth 2 (two) times daily.  Dispense: 20 capsule; Refill: 0 - fluconazole (DIFLUCAN) 150 MG tablet; 1 po q week x 4 weeks  Dispense: 4 tablet; Refill: 0    No follow-ups on file.  Continue all other maintenance medications as listed above.  Start time: 1:09 PM End time: 1:14 PM  Meds ordered this encounter  Medications  . cephALEXin (KEFLEX) 500 MG capsule    Sig: Take 1 capsule (500 mg total) by mouth 2 (two) times daily.    Dispense:  20 capsule    Refill:  0    Order Specific Question:   Supervising Provider    Answer:   Janora Norlander [[id number]]  . fluconazole (DIFLUCAN) 150 MG tablet    Sig: 1 po q week x 4 weeks    Dispense:  4 tablet    Refill:  0    Order Specific Question:   Supervising Provider    Answer:   Janora Norlander [[id number]]    Particia Nearing PA-C [place] [phone]

## 2019-06-28 ENCOUNTER — Encounter: Payer: Self-pay | Admitting: Hematology & Oncology

## 2019-06-28 ENCOUNTER — Inpatient Hospital Stay (HOSPITAL_BASED_OUTPATIENT_CLINIC_OR_DEPARTMENT_OTHER): Payer: Medicare Other | Admitting: Hematology & Oncology

## 2019-06-28 ENCOUNTER — Telehealth: Payer: Self-pay | Admitting: Hematology & Oncology

## 2019-06-28 ENCOUNTER — Other Ambulatory Visit: Payer: Self-pay

## 2019-06-28 ENCOUNTER — Inpatient Hospital Stay: Payer: Medicare Other | Attending: Family

## 2019-06-28 VITALS — BP 121/77 | HR 88 | Temp 98.0°F | Resp 18 | Wt 152.0 lb

## 2019-06-28 DIAGNOSIS — D508 Other iron deficiency anemias: Secondary | ICD-10-CM

## 2019-06-28 DIAGNOSIS — Z9884 Bariatric surgery status: Secondary | ICD-10-CM | POA: Insufficient documentation

## 2019-06-28 DIAGNOSIS — D509 Iron deficiency anemia, unspecified: Secondary | ICD-10-CM | POA: Diagnosis not present

## 2019-06-28 LAB — CBC WITH DIFFERENTIAL (CANCER CENTER ONLY)
Abs Immature Granulocytes: 0.04 10*3/uL (ref 0.00–0.07)
Basophils Absolute: 0.1 10*3/uL (ref 0.0–0.1)
Basophils Relative: 1 %
Eosinophils Absolute: 0.2 10*3/uL (ref 0.0–0.5)
Eosinophils Relative: 2 %
HCT: 37.9 % (ref 36.0–46.0)
Hemoglobin: 11.8 g/dL — ABNORMAL LOW (ref 12.0–15.0)
Immature Granulocytes: 1 %
Lymphocytes Relative: 23 %
Lymphs Abs: 1.9 10*3/uL (ref 0.7–4.0)
MCH: 27.6 pg (ref 26.0–34.0)
MCHC: 31.1 g/dL (ref 30.0–36.0)
MCV: 88.8 fL (ref 80.0–100.0)
Monocytes Absolute: 0.5 10*3/uL (ref 0.1–1.0)
Monocytes Relative: 6 %
Neutro Abs: 5.7 10*3/uL (ref 1.7–7.7)
Neutrophils Relative %: 67 %
Platelet Count: 190 10*3/uL (ref 150–400)
RBC: 4.27 MIL/uL (ref 3.87–5.11)
RDW: 16.2 % — ABNORMAL HIGH (ref 11.5–15.5)
WBC Count: 8.3 10*3/uL (ref 4.0–10.5)
nRBC: 0 % (ref 0.0–0.2)

## 2019-06-28 LAB — RETICULOCYTES
Immature Retic Fract: 9.1 % (ref 2.3–15.9)
RBC.: 4.27 MIL/uL (ref 3.87–5.11)
Retic Count, Absolute: 55.9 10*3/uL (ref 19.0–186.0)
Retic Ct Pct: 1.3 % (ref 0.4–3.1)

## 2019-06-28 NOTE — Progress Notes (Signed)
Hematology and Oncology Follow Up Visit  Erin Donaldson [id number] 01-04-1951 68 y.o. 06/28/2019   Principle Diagnosis:   Iron deficiency anemia -- gastric bypass  Current Therapy:    IV Iron -- last dose on 05/10/2019     Interim History:  Erin Donaldson is back for follow-up.  She is feeling better.  She has little bit more energy now that she had the IV iron.  Her iron levels were quite low when we first saw her.  Her ferritin was less than 4 with an iron saturation of 11%.  We went ahead and gave her 2 doses of IV iron.  This helped her.  Back a month ago, her ferritin was 805 with an iron saturation of 13%.  She is now the mother of a 20-year-old great grandchild.  She got custody of the great-grandchild as the child's parents were not fit to take care of her.  She does have some fatigue.  Again I think this is more so from her taking care of this 29-year-old child.  There has been no bleeding.  She is had no change in bowel or bladder habits.  She has had no leg swelling.  Erin Donaldson did give her a prayer blanket.  I really thought she could use this given all the stress that she is under.  Overall, I would say her performance status is ECOG 1.   Medications:  Current Outpatient Medications:  .  cephALEXin (KEFLEX) 500 MG capsule, Take 1 capsule (500 mg total) by mouth 2 (two) times daily., Disp: 20 capsule, Rfl: 0 .  cyclobenzaprine (FLEXERIL) 10 MG tablet, TAKE 1/2 TO 1 TABLET BY MOUTH TWICE A DAY AS DIRECTED, Disp: 30 tablet, Rfl: 2 .  donepezil (ARICEPT) 10 MG tablet, TAKE 1 TABLET BY MOUTH EVERY DAY, Disp: 90 tablet, Rfl: 1 .  DULoxetine (CYMBALTA) 30 MG capsule, TAKE 1 CAPSULE BY MOUTH EVERY DAY, Disp: 90 capsule, Rfl: 1 .  fluconazole (DIFLUCAN) 150 MG tablet, 1 po q week x 4 weeks, Disp: 4 tablet, Rfl: 0 .  fluticasone (FLONASE) 50 MCG/ACT nasal spray, Place 2 sprays into both nostrils daily., Disp: 48 g, Rfl: 1 .  furosemide (LASIX) 20 MG tablet, , Disp: , Rfl:  .   gabapentin (NEURONTIN) 300 MG capsule, , Disp: , Rfl:  .  lamoTRIgine (LAMICTAL) 200 MG tablet, TAKE 1 TABLET BY MOUTH EVERY DAY, Disp: 90 tablet, Rfl: 1 .  meclizine (ANTIVERT) 25 MG tablet, TAKE 1 TABLET (25 MG TOTAL) BY MOUTH 3 (THREE) TIMES DAILY AS NEEDED FOR DIZZINESS., Disp: 30 tablet, Rfl: 0 .  nystatin (MYCOSTATIN) 100000 UNIT/ML suspension, Take 5 mLs (500,000 Units total) by mouth 4 (four) times daily., Disp: 473 mL, Rfl: 1 .  nystatin cream (MYCOSTATIN), APPLY 1 APPLICATION TOPICALLY 2 (TWO) TIMES DAILY., Disp: 30 g, Rfl: 2 .  pantoprazole (PROTONIX) 40 MG tablet, TAKE 2 TABLETS (80 MG TOTAL) BY MOUTH DAILY., Disp: 180 tablet, Rfl: 0 .  potassium chloride SA (K-DUR) 20 MEQ tablet, Take 20 mEq by mouth once., Disp: , Rfl:  .  QUEtiapine (SEROQUEL) 200 MG tablet, , Disp: , Rfl:  .  QUEtiapine (SEROQUEL) 400 MG tablet, Take 2 tablets (800 mg total) by mouth at bedtime., Disp: 180 tablet, Rfl: 0 .  rizatriptan (MAXALT) 10 MG tablet, Take 1 tablet (10 mg total) by mouth as needed. May repeat in 2 hours if needed, Disp: 10 tablet, Rfl: 5 .  Vitamin D, Ergocalciferol, (DRISDOL) 1.25 MG (50000 UT) CAPS  capsule, TAKE 1 CAPSULE (50,000 UNITS TOTAL) BY MOUTH 2 (TWO) TIMES A WEEK., Disp: , Rfl: 3  Allergies:  Allergies  Allergen Reactions  . Bactrim [Sulfamethoxazole-Trimethoprim]     Raw and irritatated in mouth.  Leodis [place]. Darvocet [Propoxyphene N-Acetaminophen]   . Decongestant [Oxymetazoline]   . Elavil [Amitriptyline]   . Lithium Other (See Comments)    Hx toxicity  . Risperidone And Related   . Ultram [Tramadol]     Past Medical History, Surgical history, Social history, and Family History were reviewed and updated.  Review of Systems: Review of Systems  Constitutional: Negative.   HENT:  Negative.   Eyes: Negative.   Respiratory: Negative.   Cardiovascular: Negative.   Gastrointestinal: Negative.   Endocrine: Negative.   Genitourinary: Negative.    Musculoskeletal: Negative.    Skin: Negative.   Neurological: Negative.   Hematological: Negative.   Psychiatric/Behavioral: Negative.     Physical Exam:  weight is 152 lb (68.9 kg). Her oral temperature is 98 F (36.7 C). Her blood pressure is 121/77 and her pulse is 88. Her respiration is 18 and oxygen saturation is 98%.   Wt Readings from Last 3 Encounters:  06/28/19 152 lb (68.9 kg)  05/17/19 153 lb 6.4 oz (69.6 kg)  04/24/19 157 lb (71.2 kg)    Physical Exam Vitals signs reviewed.  HENT:     Head: Normocephalic and atraumatic.  Eyes:     Pupils: Pupils are equal, round, and reactive to light.  Neck:     Musculoskeletal: Normal range of motion.  Cardiovascular:     Rate and Rhythm: Normal rate and regular rhythm.     Heart sounds: Normal heart sounds.  Pulmonary:     Effort: Pulmonary effort is normal.     Breath sounds: Normal breath sounds.  Abdominal:     General: Bowel sounds are normal.     Palpations: Abdomen is soft.  Musculoskeletal: Normal range of motion.        General: No tenderness or deformity.  Lymphadenopathy:     Cervical: No cervical adenopathy.  Skin:    General: Skin is warm and dry.     Findings: No erythema or rash.  Neurological:     Mental Status: She is alert and oriented to person, place, and time.  Psychiatric:        Behavior: Behavior normal.        Thought Content: Thought content normal.        Judgment: Judgment normal.      Lab Results  Component Value Date   WBC 8.3 06/28/2019   HGB 11.8 (L) 06/28/2019   HCT 37.9 06/28/2019   MCV 88.8 06/28/2019   PLT 190 06/28/2019     Chemistry      Component Value Date/Time   NA 138 05/17/2019 1702   NA 140 01/24/2016 1335   K 3.9 05/17/2019 1702   K 4.3 01/24/2016 1335   CL 105 05/17/2019 1702   CL 97 (L) 07/15/2011 1413   CO2 17 (L) 05/17/2019 1702   CO2 21 (L) 01/24/2016 1335   BUN 12 05/17/2019 1702   BUN 15.4 01/24/2016 1335   CREATININE 0.85 05/17/2019 1702   CREATININE 1.21 (H) 04/24/2019 1337    CREATININE 1.0 01/24/2016 1335      Component Value Date/Time   CALCIUM 8.6 (L) 05/17/2019 1702   CALCIUM 8.7 01/24/2016 1335   ALKPHOS 199 (H) 04/24/2019 1337   ALKPHOS 302 (H) 01/24/2016 1335   AST 11 (  L) 04/24/2019 1337   AST 16 01/24/2016 1335   ALT 10 04/24/2019 1337   ALT <9 01/24/2016 1335   BILITOT 0.3 04/24/2019 1337   BILITOT <0.30 01/24/2016 1335       Impression and Plan: Ms. Lendell CapriceSullivan is a 68 year old female.  She has iron deficiency anemia.  She has a gastric bypass so she does not absorb oral iron.  The IV iron worked very nicely so far.  We will see what her iron studies look like today.  I noted that her vitamin B12 level has been a little bit on the lower side.  We may have to watch out with this I think about given her vitamin B12 intramuscularly.  I am just impressed with her for taking care of a 68-year-old and raising a 66-year-old child.  This really shows me the kind of woman that she is.  We will plan to get her back before the holidays in November.   Josph MachoPeter R Jeanifer Halliday, MD 8/12/20203:11 PM

## 2019-06-29 ENCOUNTER — Ambulatory Visit: Payer: Medicare Other

## 2019-06-29 LAB — FERRITIN: Ferritin: 166 ng/mL (ref 11–307)

## 2019-06-29 LAB — IRON AND TIBC
Iron: 84 ug/dL (ref 41–142)
Saturation Ratios: 45 % (ref 21–57)
TIBC: 186 ug/dL — ABNORMAL LOW (ref 236–444)
UIBC: 102 ug/dL — ABNORMAL LOW (ref 120–384)

## 2019-07-03 ENCOUNTER — Ambulatory Visit: Payer: Medicare Other

## 2019-07-04 ENCOUNTER — Telehealth: Payer: Self-pay | Admitting: *Deleted

## 2019-07-04 NOTE — Telephone Encounter (Signed)
Call received from patient requesting lab results from 06/28/19.  Lab results reviewed with patient.  Patient appreciative of information and has no further questions or concerns at this time.

## 2019-07-05 ENCOUNTER — Ambulatory Visit: Payer: Medicare Other | Admitting: Gastroenterology

## 2019-07-05 ENCOUNTER — Other Ambulatory Visit: Payer: Self-pay

## 2019-07-06 ENCOUNTER — Ambulatory Visit (INDEPENDENT_AMBULATORY_CARE_PROVIDER_SITE_OTHER): Payer: Medicare Other | Admitting: *Deleted

## 2019-07-06 DIAGNOSIS — E538 Deficiency of other specified B group vitamins: Secondary | ICD-10-CM | POA: Diagnosis not present

## 2019-07-06 MED ORDER — CYANOCOBALAMIN 1000 MCG/ML IJ SOLN
1000.0000 ug | INTRAMUSCULAR | Status: AC
  Administered 2019-07-06 – 2019-11-03 (×4): 1000 ug via INTRAMUSCULAR

## 2019-07-12 ENCOUNTER — Other Ambulatory Visit: Payer: Self-pay | Admitting: Family Medicine

## 2019-07-13 ENCOUNTER — Encounter: Payer: Self-pay | Admitting: *Deleted

## 2019-07-14 ENCOUNTER — Telehealth: Payer: Self-pay | Admitting: Cardiology

## 2019-07-14 ENCOUNTER — Other Ambulatory Visit: Payer: Self-pay | Admitting: Family Medicine

## 2019-07-14 ENCOUNTER — Ambulatory Visit: Payer: Medicare Other | Admitting: Psychiatry

## 2019-07-14 NOTE — Telephone Encounter (Signed)
New Message   Patient is calling in about the appointment that she has scheduled on 07/19/19 at 3 pm in [place] with Dr. Percival Spanish. Patient states that she has been having headaches and is tired. Denies having any other symptoms. Patient would like to know if appointment needed to be rescheduled.

## 2019-07-14 NOTE — Telephone Encounter (Signed)
Called patient, she states she was to follow up on a as needed basis, she is having no issues- was not sure why this appointment was made. But asked to have it cancelled.  Will route to primary to make aware. Patient appointment taken off.

## 2019-07-19 ENCOUNTER — Ambulatory Visit: Payer: Medicare Other | Admitting: Cardiology

## 2019-07-22 ENCOUNTER — Other Ambulatory Visit: Payer: Self-pay | Admitting: Family Medicine

## 2019-07-22 DIAGNOSIS — J0100 Acute maxillary sinusitis, unspecified: Secondary | ICD-10-CM

## 2019-07-27 ENCOUNTER — Other Ambulatory Visit: Payer: Self-pay | Admitting: Physician Assistant

## 2019-07-27 DIAGNOSIS — N3000 Acute cystitis without hematuria: Secondary | ICD-10-CM

## 2019-07-27 DIAGNOSIS — M549 Dorsalgia, unspecified: Secondary | ICD-10-CM

## 2019-07-28 NOTE — Telephone Encounter (Signed)
Please also have patient schedule OV to meet me.  I would like to do a chronic care follow up, as she is a transfer from Dr Laurance Flatten.

## 2019-08-03 ENCOUNTER — Other Ambulatory Visit: Payer: Self-pay | Admitting: Psychiatry

## 2019-08-03 NOTE — Telephone Encounter (Signed)
Can you check which pharmacy she's using

## 2019-08-03 NOTE — Telephone Encounter (Signed)
I tried both numbers. Pt. Did not answer so I left her a Vm to return my call.

## 2019-08-04 ENCOUNTER — Ambulatory Visit: Payer: Medicare Other | Admitting: Urology

## 2019-08-04 NOTE — Telephone Encounter (Signed)
Spoke with pt. And [facility] [place] one time thing.

## 2019-08-07 ENCOUNTER — Other Ambulatory Visit: Payer: Self-pay

## 2019-08-07 NOTE — Telephone Encounter (Signed)
Has upcoming appt with you on 10/14

## 2019-08-08 ENCOUNTER — Ambulatory Visit: Payer: Medicare Other

## 2019-08-11 ENCOUNTER — Other Ambulatory Visit: Payer: Self-pay

## 2019-08-14 ENCOUNTER — Ambulatory Visit (INDEPENDENT_AMBULATORY_CARE_PROVIDER_SITE_OTHER): Payer: Medicare Other

## 2019-08-14 ENCOUNTER — Other Ambulatory Visit: Payer: Self-pay

## 2019-08-14 DIAGNOSIS — E538 Deficiency of other specified B group vitamins: Secondary | ICD-10-CM | POA: Diagnosis not present

## 2019-08-14 NOTE — Progress Notes (Signed)
Cyanocobalamin given to left deltoid.  Patient tolerated well. 

## 2019-08-18 ENCOUNTER — Ambulatory Visit (INDEPENDENT_AMBULATORY_CARE_PROVIDER_SITE_OTHER): Payer: Medicare Other | Admitting: Family Medicine

## 2019-08-18 DIAGNOSIS — J019 Acute sinusitis, unspecified: Secondary | ICD-10-CM

## 2019-08-18 MED ORDER — PREDNISONE 10 MG (21) PO TBPK: ORAL_TABLET | ORAL | 0 refills | Status: DC

## 2019-08-18 MED ORDER — AMOXICILLIN-POT CLAVULANATE 875-125 MG PO TABS: 1.0000 | ORAL_TABLET | Freq: Two times a day (BID) | ORAL | 0 refills | Status: AC

## 2019-08-18 NOTE — Patient Instructions (Signed)

## 2019-08-18 NOTE — Progress Notes (Signed)
Virtual Visit via Telephone Note  I connected with Erin Donaldson on 08/18/19 at 12:56 PM by telephone and verified that I am speaking with the correct person using two identifiers. Erin Donaldson is currently located at home and nobody is currently with her during this visit. The provider, Loman Brooklyn, FNP is located in their office at time of visit.  I discussed the limitations, risks, security and privacy concerns of performing an evaluation and management service by telephone and the availability of in person appointments. I also discussed with the patient that there may be a patient responsible charge related to this service. The patient expressed understanding and agreed to proceed.  Subjective: PCP: Erin Norlander, DO  Chief Complaint  Patient presents with  . Sinus Problem   Patient complains of headache and facial pain/pressure. Additional symptoms include head congestion, sore throat and postnasal drainage. Onset of symptoms was 2 weeks ago, gradually worsening since that time. She is drinking plenty of fluids. Evaluation to date: none. Treatment to date: decongestants. She does not have a history of allergies, asthma, or COPD. She does not smoke. Patient has not had known recent close contact with someone who has tested positive for COVID-19.    ROS: Per HPI  Current Outpatient Medications:  .  cyclobenzaprine (FLEXERIL) 10 MG tablet, TAKE 1/2 TO 1 TABLET BY MOUTH TWICE A DAY AS DIRECTED, Disp: 30 tablet, Rfl: 2 .  donepezil (ARICEPT) 10 MG tablet, TAKE 1 TABLET BY MOUTH EVERY DAY, Disp: 90 tablet, Rfl: 1 .  DULoxetine (CYMBALTA) 30 MG capsule, TAKE 1 CAPSULE BY MOUTH EVERY DAY, Disp: 90 capsule, Rfl: 1 .  fluticasone (FLONASE) 50 MCG/ACT nasal spray, SPRAY 2 SPRAYS INTO EACH NOSTRIL EVERY DAY, Disp: 48 mL, Rfl: 1 .  furosemide (LASIX) 20 MG tablet, TAKE 1 TABLET BY MOUTH EVERY DAY, Disp: 90 tablet, Rfl: 1 .  gabapentin (NEURONTIN) 300 MG capsule, , Disp: , Rfl:  .   lamoTRIgine (LAMICTAL) 200 MG tablet, TAKE 1 TABLET BY MOUTH EVERY DAY, Disp: 90 tablet, Rfl: 1 .  meclizine (ANTIVERT) 25 MG tablet, TAKE 1 TABLET (25 MG TOTAL) BY MOUTH 3 (THREE) TIMES DAILY AS NEEDED FOR DIZZINESS., Disp: 30 tablet, Rfl: 0 .  nystatin (MYCOSTATIN) 100000 UNIT/ML suspension, Take 5 mLs (500,000 Units total) by mouth 4 (four) times daily., Disp: 473 mL, Rfl: 1 .  nystatin cream (MYCOSTATIN), APPLY TO AFFECTED AREA TWICE A DAY, Disp: 30 g, Rfl: 0 .  pantoprazole (PROTONIX) 40 MG tablet, TAKE 2 TABLETS (80 MG TOTAL) BY MOUTH DAILY., Disp: 180 tablet, Rfl: 0 .  potassium chloride SA (K-DUR) 20 MEQ tablet, Take 20 mEq by mouth once., Disp: , Rfl:  .  QUEtiapine (SEROQUEL) 200 MG tablet, , Disp: , Rfl:  .  QUEtiapine (SEROQUEL) 400 MG tablet, TAKE 2 TABLETS (800 MG TOTAL) BY MOUTH AT BEDTIME., Disp: 180 tablet, Rfl: 2 .  rizatriptan (MAXALT) 10 MG tablet, Take 1 tablet (10 mg total) by mouth as needed. May repeat in 2 hours if needed, Disp: 10 tablet, Rfl: 5 .  Vitamin D, Ergocalciferol, (DRISDOL) 1.25 MG (50000 UT) CAPS capsule, TAKE 1 CAPSULE (50,000 UNITS TOTAL) BY MOUTH 2 (TWO) TIMES A WEEK., Disp: , Rfl: 3  Current Facility-Administered Medications:  .  cyanocobalamin ((VITAMIN B-12)) injection 1,000 mcg, 1,000 mcg, Intramuscular, Q30 days, Gottschalk, Ashly M, DO, 1,000 mcg at 08/14/19 1128  Allergies  Allergen Reactions  . Bactrim [Sulfamethoxazole-Trimethoprim]     Raw and irritatated  in mouth.  Leodis Liverpool [Propoxyphene N-Acetaminophen]   . Decongestant [Oxymetazoline]   . Elavil [Amitriptyline]   . Lithium Other (See Comments)    Hx toxicity  . Risperidone And Related   . Ultram [Tramadol]    Past Medical History:  Diagnosis Date  . Anemia   . Anxiety   . Bipolar affective (HCC)   . Depression   . Diabetes in pregnancy   . Diabetes mellitus   . Elevated LFTs   . GERD (gastroesophageal reflux disease)   . Hyperlipidemia   . Hypertension   . IBS (irritable  bowel syndrome)   . Migraine     Observations/Objective: A&O  No respiratory distress or wheezing audible over the phone Mood, judgement, and thought processes all WNL  Assessment and Plan: 1. Acute non-recurrent sinusitis, unspecified location - Education provided on sinusitis. Encouraged adequate hydration. Symptom management. Encouraged patient to get tested for COVID-19 to rule it out.  - amoxicillin-clavulanate (AUGMENTIN) 875-125 MG tablet; Take 1 tablet by mouth 2 (two) times daily for 7 days.  Dispense: 14 tablet; Refill: 0 - predniSONE (STERAPRED UNI-PAK 21 TAB) 10 MG (21) TBPK tablet; As directed x 6 days  Dispense: 21 tablet; Refill: 0   Follow Up Instructions:  I discussed the assessment and treatment plan with the patient. The patient was provided an opportunity to ask questions and all were answered. The patient agreed with the plan and demonstrated an understanding of the instructions.   The patient was advised to call back or seek an in-person evaluation if the symptoms worsen or if the condition fails to improve as anticipated.  The above assessment and management plan was discussed with the patient. The patient verbalized understanding of and has agreed to the management plan. Patient is aware to call the clinic if symptoms persist or worsen. Patient is aware when to return to the clinic for a follow-up visit. Patient educated on when it is appropriate to go to the emergency department.   Time call ended: 1:02 PM  I provided 8 minutes of non-face-to-face time during this encounter.  Deliah Boston, MSN, APRN, FNP-C [facility] 08/18/19

## 2019-08-22 ENCOUNTER — Encounter: Payer: Self-pay | Admitting: Psychiatry

## 2019-08-22 ENCOUNTER — Other Ambulatory Visit: Payer: Self-pay

## 2019-08-22 ENCOUNTER — Ambulatory Visit (INDEPENDENT_AMBULATORY_CARE_PROVIDER_SITE_OTHER): Payer: Medicare Other | Admitting: Psychiatry

## 2019-08-22 DIAGNOSIS — F411 Generalized anxiety disorder: Secondary | ICD-10-CM

## 2019-08-22 DIAGNOSIS — F3181 Bipolar II disorder: Secondary | ICD-10-CM

## 2019-08-22 DIAGNOSIS — M130 Polyarthritis, unspecified: Secondary | ICD-10-CM

## 2019-08-22 DIAGNOSIS — R7989 Other specified abnormal findings of blood chemistry: Secondary | ICD-10-CM | POA: Diagnosis not present

## 2019-08-22 MED ORDER — DULOXETINE HCL 60 MG PO CPEP: 60.0000 mg | ORAL_CAPSULE | Freq: Every day | ORAL | 0 refills | Status: DC

## 2019-08-22 NOTE — Progress Notes (Signed)
Erin Donaldson [id number] 07/16/1951 68 y.o.  Subjective:   Patient ID:  Erin Donaldson is a 68 y.o. (DOB 07/16/1951) female.  Chief Complaint:  Chief Complaint  Patient presents with  . Follow-up    bipolar and anxiety and med management    HPI Erin Donaldson presents to the office today for follow-up of bipolar disorder, generalized anxiety disorder, insomnia, low vitamin D.  Last seen October 2019.  No meds were changed.  It was suggested she get her vitamin D level rechecked.  Last 2 mos really depressed.  Caring for Phineas [place]enshton 4 1/66yo,  the boy she adopted and doesn't have much help with him.  Broke off the engagement.  She stopped seeing him bc she was having to care for him too in [place].  She felt she was being used.  Also conflict with his daughter.   Feels really down, crying esp with triggers.  Still can cry over loss of mother years ago.  Low motivation and energy.  No clear reason for the depression.  Covid and isolation and mask-wearing is hard too.  Sleep OK  And not oversleeping.  Memory worse with he depression some but not marked.  Anemia and iron low in July requiring transfusion.  [facility]  Past Psychiatric Medication Trials: Olanzapine 15 restless legs, quetiapine 800, lamotrigine 200, lithium, selegiline initial hypomania resolved, Librium, clonazepam, hydroxyzine, risperidone, brief Abilify, Strattera, duloxetine, mirtazapine, buspirone, Lexapro 20, Wellbutrin angry, citalopram History of overdose with psychiatric hospitalization December 2011 Psychiatric hospitalization April 2013 [facility] for bipolar disorder manic phase  Review of Systems:  Review of Systems  Constitutional: Positive for fatigue.  Neurological: Negative for tremors and weakness.    Medications: I have reviewed the patient's current medications.  Current Outpatient Medications  Medication Sig Dispense Refill  . cyclobenzaprine (FLEXERIL) 10 MG  tablet TAKE 1/2 TO 1 TABLET BY MOUTH TWICE A DAY AS DIRECTED 30 tablet 2  . donepezil (ARICEPT) 10 MG tablet TAKE 1 TABLET BY MOUTH EVERY DAY 90 tablet 1  . DULoxetine (CYMBALTA) 30 MG capsule TAKE 1 CAPSULE BY MOUTH EVERY DAY 90 capsule 1  . fluticasone (FLONASE) 50 MCG/ACT nasal spray SPRAY 2 SPRAYS INTO EACH NOSTRIL EVERY DAY 48 mL 1  . furosemide (LASIX) 20 MG tablet TAKE 1 TABLET BY MOUTH EVERY DAY 90 tablet 1  . gabapentin (NEURONTIN) 300 MG capsule 900 mg at bedtime.     . lamoTRIgine (LAMICTAL) 200 MG tablet TAKE 1 TABLET BY MOUTH EVERY DAY 90 tablet 1  . meclizine (ANTIVERT) 25 MG tablet TAKE 1 TABLET (25 MG TOTAL) BY MOUTH 3 (THREE) TIMES DAILY AS NEEDED FOR DIZZINESS. 30 tablet 0  . pantoprazole (PROTONIX) 40 MG tablet TAKE 2 TABLETS (80 MG TOTAL) BY MOUTH DAILY. 180 tablet 0  . predniSONE (STERAPRED UNI-PAK 21 TAB) 10 MG (21) TBPK tablet As directed x 6 days 21 tablet 0  . QUEtiapine (SEROQUEL) 200 MG tablet     . QUEtiapine (SEROQUEL) 400 MG tablet TAKE 2 TABLETS (800 MG TOTAL) BY MOUTH AT BEDTIME. 180 tablet 2  . rizatriptan (MAXALT) 10 MG tablet Take 1 tablet (10 mg total) by mouth as needed. May repeat in 2 hours if needed 10 tablet 5  . Vitamin D, Ergocalciferol, (DRISDOL) 1.25 MG (50000 UT) CAPS capsule TAKE 1 CAPSULE (50,000 UNITS TOTAL) BY MOUTH 2 (TWO) TIMES A WEEK.  3  . amoxicillin-clavulanate (AUGMENTIN) 875-125 MG tablet Take 1 tablet by  mouth 2 (two) times daily for 7 days. (Patient not taking: Reported on 08/22/2019) 14 tablet 0  . nystatin (MYCOSTATIN) 100000 UNIT/ML suspension Take 5 mLs (500,000 Units total) by mouth 4 (four) times daily. (Patient not taking: Reported on 08/22/2019) 473 mL 1  . nystatin cream (MYCOSTATIN) APPLY TO AFFECTED AREA TWICE A DAY (Patient not taking: Reported on 08/22/2019) 30 g 0  . potassium chloride SA (K-DUR) 20 MEQ tablet Take 20 mEq by mouth once.     Current Facility-Administered Medications  Medication Dose Route Frequency Provider  Last Rate Last Dose  . cyanocobalamin ((VITAMIN B-12)) injection 1,000 mcg  1,000 mcg Intramuscular Q30 days Delynn Flavin M, DO   1,000 mcg at 08/14/19 1128    Medication Side Effects: None  Allergies:  Allergies  Allergen Reactions  . Bactrim [Sulfamethoxazole-Trimethoprim]     Raw and irritatated in mouth.  Leodis Liverpool [Propoxyphene N-Acetaminophen]   . Decongestant [Oxymetazoline]   . Elavil [Amitriptyline]   . Lithium Other (See Comments)    Hx toxicity  . Risperidone And Related   . Ultram [Tramadol]     Past Medical History:  Diagnosis Date  . Anemia   . Anxiety   . Bipolar affective (HCC)   . Depression   . Diabetes in pregnancy   . Diabetes mellitus   . Elevated LFTs   . GERD (gastroesophageal reflux disease)   . Hyperlipidemia   . Hypertension   . IBS (irritable bowel syndrome)   . Migraine     Family History  Problem Relation Age of Onset  . Kidney disease Father   . Heart disease Father 47       CABG  . Diabetes Father   . CAD Father   . Heart disease Mother 46       CAD  . Breast cancer Mother   . Cancer Mother        bladder  . Heart disease Maternal Grandfather   . Heart attack Maternal Grandfather   . Stroke Maternal Grandfather   . Heart disease Brother   . Dementia Brother   . Heart disease Maternal Aunt   . Heart disease Maternal Uncle   . Heart disease Paternal Aunt   . Heart disease Paternal Uncle   . Diabetes Maternal Aunt   . Diabetes Maternal Uncle   . Diabetes Paternal Aunt   . Diabetes Paternal Uncle   . Stroke Maternal Grandmother   . Heart disease Paternal Grandfather   . Diabetes Daughter   . Drug abuse Daughter   . Depression Sister   . Hypertension Sister   . Alcohol abuse Brother   . Drug abuse Brother   . Other Brother        accident caused brain swelling   . Colon cancer Neg Hx     Social History   Socioeconomic History  . Marital status: Widowed    Spouse name: Not on file  . Number of children: 1   . Years of education: college  . Highest education level: Not on file  Occupational History  . Occupation: Disabled    Comment: [employer]   Social Needs  . Financial resource strain: Not on file  . Food insecurity    Worry: Not on file    Inability: Not on file  . Transportation needs    Medical: Not on file    Non-medical: Not on file  Tobacco Use  . Smoking status: Never Smoker  . Smokeless tobacco: Never Used  Substance and Sexual Activity  . Alcohol use: No    Alcohol/week: 0.0 standard drinks    Comment: rare  . Drug use: No  . Sexual activity: Not on file  Lifestyle  . Physical activity    Days per week: Not on file    Minutes per session: Not on file  . Stress: Not on file  Relationships  . Social Musician on phone: Not on file    Gets together: Not on file    Attends religious service: Not on file    Active member of club or organization: Not on file    Attends meetings of clubs or organizations: Not on file    Relationship status: Not on file  . Intimate partner violence    Fear of current or ex partner: Not on file    Emotionally abused: Not on file    Physically abused: Not on file    Forced sexual activity: Not on file  Other Topics Concern  . Not on file  Social History Narrative   Lives with great grandson.      Past Medical History, Surgical history, Social history, and Family history were reviewed and updated as appropriate.   Please see review of systems for further details on the patient's review from today.   Objective:   Physical Exam:  There were no vitals taken for this visit.  Physical Exam Constitutional:      General: She is not in acute distress.    Appearance: She is well-developed. She is obese.  Musculoskeletal:        General: No deformity.  Neurological:     Mental Status: She is alert and oriented to person, place, and time.     Coordination: Coordination normal.  Psychiatric:        Attention and Perception:  Attention and perception normal. She does not perceive auditory or visual hallucinations.        Mood and Affect: Mood is depressed. Mood is not anxious. Affect is not labile, blunt, angry or inappropriate.        Speech: Speech normal.        Behavior: Behavior normal.        Thought Content: Thought content normal. Thought content does not include homicidal or suicidal ideation. Thought content does not include homicidal or suicidal plan.        Cognition and Memory: Cognition normal. She exhibits impaired recent memory.        Judgment: Judgment normal.     Comments: Insight fair No delusions.      Lab Review:     Component Value Date/Time   NA 138 05/17/2019 1702   NA 140 01/24/2016 1335   K 3.9 05/17/2019 1702   K 4.3 01/24/2016 1335   CL 105 05/17/2019 1702   CL 97 (L) 07/15/2011 1413   CO2 17 (L) 05/17/2019 1702   CO2 21 (L) 01/24/2016 1335   GLUCOSE 171 (H) 05/17/2019 1702   GLUCOSE 133 (H) 04/24/2019 1337   GLUCOSE 108 01/24/2016 1335   GLUCOSE 86 07/15/2011 1413   BUN 12 05/17/2019 1702   BUN 15.4 01/24/2016 1335   CREATININE 0.85 05/17/2019 1702   CREATININE 1.21 (H) 04/24/2019 1337   CREATININE 1.0 01/24/2016 1335   CALCIUM 8.6 (L) 05/17/2019 1702   CALCIUM 8.7 01/24/2016 1335   PROT 6.2 (L) 04/24/2019 1337   PROT 6.4 03/23/2019 1151   PROT 7.1 01/24/2016 1335   ALBUMIN 3.8 04/24/2019 1337  ALBUMIN 4.1 03/23/2019 1151   ALBUMIN 3.5 01/24/2016 1335   AST 11 (L) 04/24/2019 1337   AST 16 01/24/2016 1335   ALT 10 04/24/2019 1337   ALT <9 01/24/2016 1335   ALKPHOS 199 (H) 04/24/2019 1337   ALKPHOS 302 (H) 01/24/2016 1335   BILITOT 0.3 04/24/2019 1337   BILITOT <0.30 01/24/2016 1335   GFRNONAA 72 05/17/2019 1702   GFRNONAA 47 (L) 04/24/2019 1337   GFRNONAA 67 04/17/2013 1135   GFRAA 83 05/17/2019 1702   GFRAA 54 (L) 04/24/2019 1337   GFRAA 78 04/17/2013 1135       Component Value Date/Time   WBC 8.3 06/28/2019 1416   WBC 13.7 (H) 02/24/2012 1620    RBC 4.27 06/28/2019 1417   RBC 4.27 06/28/2019 1416   HGB 11.8 (L) 06/28/2019 1416   HGB 9.6 (L) 05/17/2019 1702   HGB 6.9 (LL) 01/24/2016 1334   HGB 7.8 (L) 09/26/2007 1510   HCT 37.9 06/28/2019 1416   HCT 31.1 (L) 05/17/2019 1702   HCT 24.5 (L) 01/24/2016 1334   HCT 24.8 (L) 09/26/2007 1510   PLT 190 06/28/2019 1416   PLT 286 05/17/2019 1702   MCV 88.8 06/28/2019 1416   MCV 84 05/17/2019 1702   MCV 67 (L) 01/24/2016 1334   MCV 64.4 (L) 09/26/2007 1510   MCH 27.6 06/28/2019 1416   MCHC 31.1 06/28/2019 1416   RDW 16.2 (H) 06/28/2019 1416   RDW 19.8 (H) 05/17/2019 1702   RDW 18.1 (H) 01/24/2016 1334   RDW 17.3 (H) 09/26/2007 1510   LYMPHSABS 1.9 06/28/2019 1416   LYMPHSABS 0.8 05/17/2019 1702   LYMPHSABS 1.4 01/24/2016 1334   LYMPHSABS 1.6 09/26/2007 1510   MONOABS 0.5 06/28/2019 1416   MONOABS 0.5 09/26/2007 1510   EOSABS 0.2 06/28/2019 1416   EOSABS 0.1 05/17/2019 1702   EOSABS 0.3 01/24/2016 1334   BASOSABS 0.1 06/28/2019 1416   BASOSABS 0.0 05/17/2019 1702   BASOSABS 0.1 01/24/2016 1334   BASOSABS 0.0 09/26/2007 1510    Lithium Lvl  Date Value Ref Range Status  03/31/2010 0.27 (L) 0.80 - 1.40 mEq/L Final     No results found for: PHENYTOIN, PHENOBARB, VALPROATE, CBMZ   .res Assessment: Plan:    Erin Donaldson was seen today for follow-up.  Diagnoses and all orders for this visit:  Bipolar II disorder (HCC)  Generalized anxiety disorder  Low serum vitamin D  Greater than 50% of 30-minute face to face time with patient was spent on counseling and coordination of care. We discussed the following.  Suportive therapy dealing with loss of relationship and isolation and feeling overwhelmed caring for a child.  Encourage increased activity and self-care to help with the above and also to help with her depression.  Patient has a history of bipolar disorder and had a manic episode with hospitalization as noted a few years ago.  She is taking 2 medications that should help  with bipolar depression now including the quetiapine and lamotrigine.  Both her reasonably high dosages and it is unlikely to help to go any higher in those dosages.  She has been prescribed duloxetine for fibromyalgia by another doctor.  The simplest solution would be to increase duloxetine to try to help with both fibromyalgia and depression.  Discussed the risk of it triggering mania and discussed signs and symptoms of mania in detail.  Increase duloxetine to 60 mg daily. Continue quetiapine thousand milligrams daily.  Discussed the high dose. Continue lamotrigine 200 mg daily  Discussed potential metabolic side effects associated with atypical antipsychotics, as well as potential risk for movement side effects. Advised pt to contact office if movement side effects occur.   Follow-up 8 weeks  Lynder Parents MD, DFAPA Please see After Visit Summary for patient specific instructions.  Future Appointments  Date Time Provider [place]  09/11/2019  9:15 AM [facility] NURSE [facility] None  09/19/2019  1:30 PM Cottle, Billey Co., MD [facility] None  09/27/2019  2:45 PM [facility] None  09/27/2019  3:15 PM [place], Holli Humbles, NP [facility] None  10/16/2019 10:00 AM Janora Norlander, DO [facility] None    No orders of the defined types were placed in this encounter.   -------------------------------

## 2019-08-30 ENCOUNTER — Ambulatory Visit: Payer: Medicare Other | Admitting: Family Medicine

## 2019-09-08 ENCOUNTER — Other Ambulatory Visit: Payer: Self-pay

## 2019-09-11 ENCOUNTER — Ambulatory Visit (INDEPENDENT_AMBULATORY_CARE_PROVIDER_SITE_OTHER): Payer: Medicare Other

## 2019-09-11 DIAGNOSIS — E538 Deficiency of other specified B group vitamins: Secondary | ICD-10-CM | POA: Diagnosis not present

## 2019-09-11 DIAGNOSIS — Z23 Encounter for immunization: Secondary | ICD-10-CM

## 2019-09-11 NOTE — Progress Notes (Signed)
Cyanocobalamin injection given to right deltoid.  Patient tolerated well. 

## 2019-09-12 ENCOUNTER — Ambulatory Visit (INDEPENDENT_AMBULATORY_CARE_PROVIDER_SITE_OTHER): Payer: Medicare Other | Admitting: *Deleted

## 2019-09-12 DIAGNOSIS — Z0000 Encounter for general adult medical examination without abnormal findings: Secondary | ICD-10-CM | POA: Diagnosis not present

## 2019-09-12 NOTE — Progress Notes (Signed)
MEDICARE ANNUAL WELLNESS VISIT  09/12/2019  Telephone Visit Disclaimer This Medicare AWV was conducted by telephone due to national recommendations for restrictions regarding the COVID-19 Pandemic (e.g. social distancing).  I verified, using two identifiers, that I am speaking with Erin Donaldson or their authorized healthcare agent. I discussed the limitations, risks, security, and privacy concerns of performing an evaluation and management service by telephone and the potential availability of an in-person appointment in the future. The patient expressed understanding and agreed to proceed.   Subjective:  Erin Donaldson is a 68 y.o. female patient of Erin Ip, DO who had a Medicare Annual Wellness Visit today via telephone. Erin Donaldson is Disabled and lives with their son. she has 2 children. she reports that she is not socially active and does interact with Donaldson/family regularly. she is not physically active and enjoys keeping up with her 80 year old adopted son.  Patient Care Team: Erin Ip, DO as PCP - General (Family Medicine) Erin Donaldson, Erin Friends, MD as Consulting Physician (Gastroenterology) Cottle, Steva Ready., MD as Attending Physician (Psychiatry) Erin Macho, MD as Consulting Physician (Oncology)  Advanced Directives 06/28/2019 05/10/2019 04/24/2019 08/12/2018 01/31/2016 01/24/2016  Does Patient Have a Medical Advance Directive? No No No No No No  Would patient like information on creating a medical advance directive? No - Patient declined No - Patient declined No - Patient declined Yes (MAU/Ambulatory/Procedural Areas - Information given) No - patient declined information No - patient declined information    Hospital Utilization Over the Past 12 Months: # of hospitalizations or ER visits: 0 # of surgeries: 0  Review of Systems    Patient reports that her overall health is worse compared to last year.  History obtained from chart review and the patient   Patient Reported Readings (BP, Pulse, CBG, Weight, etc) Done at home this am 109/65  Pain Assessment Pain : 0-10 Pain Score: 6  Pain Type: Chronic pain Pain Location: (all over Fibromyalgia) Pain Descriptors / Indicators: Constant Pain Onset: More than a month ago Pain Frequency: Intermittent Pain Relieving Factors: Cymbalta Effect of Pain on Daily Activities: yes, will explain in fuctional statua  Pain Relieving Factors: Cymbalta  Current Medications & Allergies (verified) Allergies as of 09/12/2019      Reactions   Bactrim [sulfamethoxazole-trimethoprim]    Raw and irritatated in mouth.   Darvocet [propoxyphene N-acetaminophen]    Decongestant [oxymetazoline]    Elavil [amitriptyline]    Lithium Other (See Comments)   Hx toxicity   Risperidone And Related    Ultram [tramadol]       Medication List       Accurate as of September 12, 2019 10:13 AM. If you have any questions, ask your nurse or doctor.        STOP taking these medications   nystatin 100000 UNIT/ML suspension Commonly known as: MYCOSTATIN   nystatin cream Commonly known as: MYCOSTATIN   potassium chloride SA 20 MEQ tablet Commonly known as: KLOR-CON   predniSONE 10 MG (21) Tbpk tablet Commonly known as: STERAPRED UNI-PAK 21 TAB     TAKE these medications   cyclobenzaprine 10 MG tablet Commonly known as: FLEXERIL TAKE 1/2 TO 1 TABLET BY MOUTH TWICE A DAY AS DIRECTED   donepezil 10 MG tablet Commonly known as: ARICEPT TAKE 1 TABLET BY MOUTH EVERY DAY   DULoxetine 60 MG capsule Commonly known as: CYMBALTA Take 1 capsule (60 mg total) by mouth daily.   fluticasone  50 MCG/ACT nasal spray Commonly known as: FLONASE SPRAY 2 SPRAYS INTO EACH NOSTRIL EVERY DAY   furosemide 20 MG tablet Commonly known as: LASIX TAKE 1 TABLET BY MOUTH EVERY DAY   gabapentin 300 MG capsule Commonly known as: NEURONTIN 900 mg at bedtime.   lamoTRIgine 200 MG tablet Commonly known as: LAMICTAL TAKE 1 TABLET  BY MOUTH EVERY DAY   meclizine 25 MG tablet Commonly known as: ANTIVERT TAKE 1 TABLET (25 MG TOTAL) BY MOUTH 3 (THREE) TIMES DAILY AS NEEDED FOR DIZZINESS.   pantoprazole 40 MG tablet Commonly known as: PROTONIX TAKE 2 TABLETS (80 MG TOTAL) BY MOUTH DAILY.   QUEtiapine 200 MG tablet Commonly known as: SEROQUEL   QUEtiapine 400 MG tablet Commonly known as: SEROQUEL TAKE 2 TABLETS (800 MG TOTAL) BY MOUTH AT BEDTIME.   rizatriptan 10 MG tablet Commonly known as: MAXALT Take 1 tablet (10 mg total) by mouth as needed. May repeat in 2 hours if needed   Vitamin D (Ergocalciferol) 1.25 MG (50000 UT) Caps capsule Commonly known as: DRISDOL TAKE 1 CAPSULE (50,000 UNITS TOTAL) BY MOUTH 2 (TWO) TIMES A WEEK.       History (reviewed): Past Medical History:  Diagnosis Date  . Anemia   . Anxiety   . Bipolar affective (HCC)   . Depression   . Diabetes in pregnancy   . Diabetes mellitus   . Elevated LFTs   . GERD (gastroesophageal reflux disease)   . Hyperlipidemia   . Hypertension   . IBS (irritable bowel syndrome)   . Migraine    Past Surgical History:  Procedure Laterality Date  . CHOLECYSTECTOMY    . GASTRIC BYPASS  2005   Family History  Problem Relation Age of Onset  . Kidney disease Father   . Heart disease Father [id number]       CABG  . Diabetes Father   . CAD Father   . Heart disease Mother [id number]       CAD  . Breast cancer Mother   . Cancer Mother        bladder  . Heart disease Maternal Grandfather   . Heart attack Maternal Grandfather   . Stroke Maternal Grandfather   . Heart disease Brother   . Dementia Brother   . Heart disease Maternal Aunt   . Heart disease Maternal Uncle   . Heart disease Paternal Aunt   . Heart disease Paternal Uncle   . Diabetes Maternal Aunt   . Diabetes Maternal Uncle   . Diabetes Paternal Aunt   . Diabetes Paternal Uncle   . Stroke Maternal Grandmother   . Heart disease Paternal Grandfather   . Diabetes Daughter   . Drug abuse  Daughter   . Depression Sister   . Hypertension Sister   . Alcohol abuse Brother   . Drug abuse Brother   . Other Brother        accident caused brain swelling   . Colon cancer Neg Hx    Social History   Socioeconomic History  . Marital status: Widowed    Spouse name: Not on file  . Number of children: 1  . Years of education: college  . Highest education level: Not on file  Occupational History  . Occupation: disabled    Comment: [employer]   Social Needs  . Financial resource strain: Not hard at all  . Food insecurity    Worry: Never true    Inability: Never true  . Transportation needs  Medical: No    Non-medical: No  Tobacco Use  . Smoking status: Never Smoker  . Smokeless tobacco: Never Used  Substance and Sexual Activity  . Alcohol use: No    Alcohol/week: 0.0 standard drinks    Comment: rare  . Drug use: No  . Sexual activity: Not Currently  Lifestyle  . Physical activity    Days per week: 0 days    Minutes per session: 0 min  . Stress: Very much  Relationships  . Social connections    Talks on phone: More than three times a week    Gets together: Once a week    Attends religious service: Patient refused    Active member of club or organization: No    Attends meetings of clubs or organizations: Never    Relationship status: Widowed  Other Topics Concern  . Not on file  Social History Narrative   Lives with great grandson.      Activities of Daily Living In your present state of health, do you have any difficulty performing the following activities: 09/12/2019  Hearing? N  Vision? N  Difficulty concentrating or making decisions? N  Walking or climbing stairs? N  Dressing or bathing? N  Doing errands, shopping? N  Preparing Food and eating ? N  Using the Toilet? N  In the past six months, have you accidently leaked urine? Y  Do you have problems with loss of bowel control? N  Managing your Finances? N  Housekeeping or managing your Housekeeping? N   Some recent data might be hidden    Patient Education/ Literacy How often do you need to have someone help you when you read instructions, pamphlets, or other written materials from your doctor or pharmacy?: 1 - Never What is the last grade level you completed in school?: 12  Exercise Current Exercise Habits: The patient does not participate in regular exercise at present, Exercise limited by: Other - see comments(fibromyalgia)  Diet Patient reports consuming 2 meals a day and 1 snack(s) a day Patient reports that her primary diet is: Regular Patient reports that she does have regular access to food.   Depression Screen PHQ 2/9 Scores 09/12/2019 05/17/2019 03/23/2019 12/21/2018 11/23/2018 11/07/2018 11/03/2018  PHQ - 2 Score 6 0 - 0  PHQ- 9 Score 10 - - -  Exception Documentation - - Other- indicate reason in comment box - - - -  Not completed - - Patient goes for counseling with Dr. Jennelle Human. - - - -     Fall Risk Fall Risk  09/12/2019 05/17/2019 03/23/2019 12/21/2018 11/23/2018  Falls in the past year? 0 0 0 0 0  Number falls in past yr: - - - - -  Injury with Fall? - - - - -  Risk for fall due to : - - - - -     Objective:  Erin Donaldson seemed alert and oriented and she participated appropriately during our telephone visit.  Blood Pressure Weight BMI  BP Readings from Last 3 Encounters:  06/28/19 121/77  05/17/19 131/80  05/10/19 (!) 100/55   Wt Readings from Last 3 Encounters:  06/28/19 152 lb (68.9 kg)  05/17/19 153 lb 6.4 oz (69.6 kg)  04/24/19 157 lb (71.2 kg)   BMI Readings from Last 1 Encounters:  06/28/19 26.09 kg/m    *Unable to obtain current vital signs, weight, and BMI due to telephone visit type  Hearing/Vision  . Melida did not  seem to have difficulty with hearing/understanding during the telephone conversation . Reports that she has not had a formal eye exam by an eye care professional within the past year . Reports that she has not had a formal  hearing evaluation within the past year *Unable to fully assess hearing and vision during telephone visit type  Cognitive Function: 6CIT Screen 09/12/2019  What Year? 0 points  What month? 0 points  What time? 0 points  Count back from 20 0 points  Months in reverse 0 points  Repeat phrase 0 points  Total Score 0   (Normal:0-7, Significant for Dysfunction: >8)  Normal Cognitive Function Screening: Yes   Immunization & Health Maintenance Record Immunization History  Administered Date(s) Administered  . Fluad Quad(high Dose 65+) 09/11/2019  . Influenza, High Dose Seasonal PF 08/24/2018  . Influenza,inj,Quad PF,6+ Mos 10/02/2016, 10/18/2017  . Pneumococcal Polysaccharide-23 06/04/2010  . Zoster 06/04/2010    Health Maintenance  Topic Date Due  . TETANUS/TDAP  11/16/2012  . COLONOSCOPY  04/05/2013  . OPHTHALMOLOGY EXAM  03/17/2015  . DEXA SCAN  08/10/2018  . PNA vac Low Risk Adult (1 of 2 - PCV13) 08/10/2018  . HEMOGLOBIN A1C  07/11/2019  . URINE MICROALBUMIN  08/30/2019  . FOOT EXAM  12/22/2019  . MAMMOGRAM  11/01/2020  . INFLUENZA VACCINE  Completed  . Hepatitis C Screening  Completed       Assessment  This is a routine wellness examination for Erin Donaldson.  Health Maintenance: Due or Overdue Health Maintenance Due  Topic Date Due  . TETANUS/TDAP  11/16/2012  . COLONOSCOPY  04/05/2013  . OPHTHALMOLOGY EXAM  03/17/2015  . DEXA SCAN  08/10/2018  . PNA vac Low Risk Adult (1 of 2 - PCV13) 08/10/2018  . HEMOGLOBIN A1C  07/11/2019  . URINE MICROALBUMIN  08/30/2019    Erin Donaldson does not need a referral for Community Assistance: Care Management:   no Social Work:    no Prescription Assistance:  no Nutrition/Diabetes Education:  no   Plan:  Personalized Goals Goals Addressed            This Visit's Progress   . Take medications as prescribed       Take meds as the are prescribed, do not skip doses because of feeling better.       Personalized Health Maintenance & Screening Recommendations  Pneumococcal vaccine  Td vaccine Bone densitometry screening Colorectal cancer screening Diabetes screening  Lung Cancer Screening Recommended: no (Low Dose CT Chest recommended if Age 8-80 years, 30 pack-year currently smoking OR have quit w/in past 15 years) Hepatitis C Screening recommended: no HIV Screening recommended: no  Advanced Directives: Written information was prepared per patient's request.  Referrals & Orders No orders of the defined types were placed in this encounter.   Follow-up Plan . Follow-up with Erin Ip, DO as planned on 10/16/19. Marland Kitchen Schedule  . Pt alert and answered all questions accordingly. Pt denies being a diabetic. We will obtain A1C and micro albumin on next visit. (added to appt note). Pt has no trouble with hearing or seeing. . We will discuss colonoscopy, dexa, eye exam, tdap and prevnar at visit on 10/16/19. (added to appt note) . Pt has an adopted 22 year old son, she gets her exercise from taking care of him. She has fibromyalgia and can not do much exercising. . Advanced Directives placed up front for pt to pick up.   I have personally reviewed and  noted the following in the patient's chart:   . Medical and social history . Use of alcohol, tobacco or illicit drugs  . Current medications and supplements . Functional ability and status . Nutritional status . Physical activity . Advanced directives . List of other physicians . Hospitalizations, surgeries, and ER visits in previous 12 months . Vitals . Screenings to include cognitive, depression, and falls . Referrals and appointments  In addition, I have reviewed and discussed with Erin Donaldson certain preventive protocols, quality metrics, and best practice recommendations. A written personalized care plan for preventive services as well as general preventive health recommendations is available and can be mailed to  the patient at her request.      Rana Snare, LPN  [id number]

## 2019-09-16 ENCOUNTER — Other Ambulatory Visit: Payer: Self-pay | Admitting: Family Medicine

## 2019-09-18 ENCOUNTER — Other Ambulatory Visit: Payer: Self-pay | Admitting: Psychiatry

## 2019-09-19 ENCOUNTER — Ambulatory Visit: Payer: Medicare Other | Admitting: Psychiatry

## 2019-09-21 ENCOUNTER — Other Ambulatory Visit: Payer: Self-pay | Admitting: Family Medicine

## 2019-09-21 DIAGNOSIS — M549 Dorsalgia, unspecified: Secondary | ICD-10-CM

## 2019-09-27 ENCOUNTER — Other Ambulatory Visit: Payer: Medicare Other

## 2019-09-27 ENCOUNTER — Ambulatory Visit: Payer: Medicare Other | Admitting: Family

## 2019-10-09 ENCOUNTER — Inpatient Hospital Stay: Payer: Medicare Other | Admitting: Family

## 2019-10-09 ENCOUNTER — Ambulatory Visit: Payer: Medicare Other

## 2019-10-09 ENCOUNTER — Inpatient Hospital Stay: Payer: Medicare Other

## 2019-10-16 ENCOUNTER — Ambulatory Visit: Payer: Medicare Other | Admitting: Family Medicine

## 2019-10-17 ENCOUNTER — Other Ambulatory Visit: Payer: Self-pay | Admitting: Family Medicine

## 2019-10-25 ENCOUNTER — Encounter: Payer: Self-pay | Admitting: Psychiatry

## 2019-10-25 ENCOUNTER — Ambulatory Visit (INDEPENDENT_AMBULATORY_CARE_PROVIDER_SITE_OTHER): Payer: Medicare Other | Admitting: Psychiatry

## 2019-10-25 DIAGNOSIS — R7989 Other specified abnormal findings of blood chemistry: Secondary | ICD-10-CM

## 2019-10-25 DIAGNOSIS — F411 Generalized anxiety disorder: Secondary | ICD-10-CM | POA: Diagnosis not present

## 2019-10-25 DIAGNOSIS — F3181 Bipolar II disorder: Secondary | ICD-10-CM | POA: Diagnosis not present

## 2019-10-25 DIAGNOSIS — M130 Polyarthritis, unspecified: Secondary | ICD-10-CM | POA: Diagnosis not present

## 2019-10-25 NOTE — Progress Notes (Signed)
Erin Donaldson [id number] 10-26-1951 68 y.o.   Virtual Visit via WebEX  I connected with pt by WebEx and verified that I am speaking with the correct person using two identifiers.   I discussed the limitations, risks, security and privacy concerns of performing an evaluation and management service by [facility] and the availability of in person appointments. I also discussed with the patient that there may be a patient responsible charge related to this service. The patient expressed understanding and agreed to proceed.  I discussed the assessment and treatment plan with the patient. The patient was provided an opportunity to ask questions and all were answered. The patient agreed with the plan and demonstrated an understanding of the instructions.   The patient was advised to call back or seek an in-person evaluation if the symptoms worsen or if the condition fails to improve as anticipated.  I provided 30 minutes of video time during this encounter. The call started at 230 and ended at 2:45. The patient was located at home and the provider was located office.   Subjective:   Patient ID:  Erin Donaldson is a 68 y.o. (DOB Mar 13, 1951) female.  Chief Complaint:  Chief Complaint  Patient presents with  . Follow-up     Medication Management  . Other    Bipolar 2  . Depression    med management    HPI Erin Donaldson presents to the office today for follow-up of bipolar disorder, generalized anxiety disorder, insomnia, low vitamin D.  Last seen October 2020.  Increased duloxetine to 60 mg daily   . No SE.  Patient did not come into the office today because she was exposed to Covid yesterday.  It helped some.  Not as down.  Going out more.  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  No additional benefit for pain.   Last 2 mos really depressed.  Caring for [name] 4 1/66yo,  the boy she adopted and doesn't have much help with him.   Covid and isolation and mask-wearing  is hard too.  Sleep OK  And not oversleeping.  Memory worse with he depression some but not marked.  Anemia and iron low in July requiring transfusion.  FU November Duloxetine 30 mg started for FM  Past Psychiatric Medication Trials: Olanzapine 15 restless legs, quetiapine 800, lamotrigine 200, lithium, selegiline initial hypomania resolved, Librium, clonazepam, hydroxyzine, risperidone, brief Abilify, Strattera, duloxetine, mirtazapine, buspirone, Lexapro 20, Wellbutrin angry, citalopram History of overdose with psychiatric hospitalization December 2011 Psychiatric hospitalization April 2013 [facility] for bipolar disorder manic phase  Review of Systems:  Review of Systems  Constitutional: Positive for fatigue.  Neurological: Negative for tremors and weakness.    Medications: I have reviewed the patient's current medications.  Current Outpatient Medications  Medication Sig Dispense Refill  . cyclobenzaprine (FLEXERIL) 10 MG tablet TAKE 1/2 TO 1 TABLET BY MOUTH TWICE A DAY AS DIRECTED 30 tablet 2  . donepezil (ARICEPT) 10 MG tablet TAKE 1 TABLET BY MOUTH EVERY DAY 90 tablet 1  . DULoxetine (CYMBALTA) 60 MG capsule Take 1 capsule (60 mg total) by mouth daily. 90 capsule 0  . fluticasone (FLONASE) 50 MCG/ACT nasal spray SPRAY 2 SPRAYS INTO EACH NOSTRIL EVERY DAY 48 mL 1  . furosemide (LASIX) 20 MG tablet TAKE 1 TABLET BY MOUTH EVERY DAY 90 tablet 1  . gabapentin (NEURONTIN) 300 MG capsule 900 mg at bedtime.     . lamoTRIgine (LAMICTAL) 200  MG tablet TAKE 1 TABLET BY MOUTH EVERY DAY 90 tablet 1  . meclizine (ANTIVERT) 25 MG tablet TAKE 1 TABLET (25 MG TOTAL) BY MOUTH 3 (THREE) TIMES DAILY AS NEEDED FOR DIZZINESS. 30 tablet 0  . nystatin cream (MYCOSTATIN) APPLY TO AFFECTED AREA TWICE A DAY 30 g 0  . pantoprazole (PROTONIX) 40 MG tablet TAKE 2 TABLETS (80 MG TOTAL) BY MOUTH DAILY. 180 tablet 0  . QUEtiapine (SEROQUEL) 200 MG tablet     . QUEtiapine (SEROQUEL) 400 MG tablet  TAKE 2 TABLETS (800 MG TOTAL) BY MOUTH AT BEDTIME. 180 tablet 2  . rizatriptan (MAXALT) 10 MG tablet Take 1 tablet (10 mg total) by mouth as needed. May repeat in 2 hours if needed 10 tablet 5  . Vitamin D, Ergocalciferol, (DRISDOL) 1.25 MG (50000 UT) CAPS capsule TAKE 1 CAPSULE (50,000 UNITS TOTAL) BY MOUTH 2 (TWO) TIMES A WEEK.  3   Current Facility-Administered Medications  Medication Dose Route Frequency Provider Last Rate Last Dose  . cyanocobalamin ((VITAMIN B-12)) injection 1,000 mcg  1,000 mcg Intramuscular Q30 days Delynn Flavin M, DO   1,000 mcg at 09/11/19 4098    Medication Side Effects: None  Allergies:  Allergies  Allergen Reactions  . Bactrim [Sulfamethoxazole-Trimethoprim]     Raw and irritatated in mouth.  Leodis Liverpool [Propoxyphene N-Acetaminophen]   . Decongestant [Oxymetazoline]   . Elavil [Amitriptyline]   . Lithium Other (See Comments)    Hx toxicity  . Risperidone And Related   . Ultram [Tramadol]     Past Medical History:  Diagnosis Date  . Anemia   . Anxiety   . Bipolar affective (HCC)   . Depression   . Diabetes in pregnancy   . Diabetes mellitus   . Elevated LFTs   . GERD (gastroesophageal reflux disease)   . Hyperlipidemia   . Hypertension   . IBS (irritable bowel syndrome)   . Migraine     Family History  Problem Relation Age of Onset  . Kidney disease Father   . Heart disease Father 68       CABG  . Diabetes Father   . CAD Father   . Heart disease Mother 23       CAD  . Breast cancer Mother   . Cancer Mother        bladder  . Heart disease Maternal Grandfather   . Heart attack Maternal Grandfather   . Stroke Maternal Grandfather   . Heart disease Brother   . Dementia Brother   . Heart disease Maternal Aunt   . Heart disease Maternal Uncle   . Heart disease Paternal Aunt   . Heart disease Paternal Uncle   . Diabetes Maternal Aunt   . Diabetes Maternal Uncle   . Diabetes Paternal Aunt   . Diabetes Paternal Uncle   .  Stroke Maternal Grandmother   . Heart disease Paternal Grandfather   . Diabetes Daughter   . Drug abuse Daughter   . Depression Sister   . Hypertension Sister   . Alcohol abuse Brother   . Drug abuse Brother   . Other Brother        accident caused brain swelling   . Colon cancer Neg Hx     Social History   Socioeconomic History  . Marital status: Widowed    Spouse name: Not on file  . Number of children: 1  . Years of education: college  . Highest education level: Not on file  Occupational History  .  Occupation: disabled    Comment: [employer]   Social Needs  . Financial resource strain: Not hard at all  . Food insecurity    Worry: Never true    Inability: Never true  . Transportation needs    Medical: No    Non-medical: No  Tobacco Use  . Smoking status: Never Smoker  . Smokeless tobacco: Never Used  Substance and Sexual Activity  . Alcohol use: No    Alcohol/week: 0.0 standard drinks    Comment: rare  . Drug use: No  . Sexual activity: Not Currently  Lifestyle  . Physical activity    Days per week: 0 days    Minutes per session: 0 min  . Stress: Very much  Relationships  . Social connections    Talks on phone: More than three times a week    Gets together: Once a week    Attends religious service: Patient refused    Active member of club or organization: No    Attends meetings of clubs or organizations: Never    Relationship status: Widowed  . Intimate partner violence    Fear of current or ex partner: No    Emotionally abused: No    Physically abused: No    Forced sexual activity: No  Other Topics Concern  . Not on file  Social History Narrative   Lives with great grandson.      Past Medical History, Surgical history, Social history, and Family history were reviewed and updated as appropriate.   Please see review of systems for further details on the patient's review from today.   Objective:   Physical Exam:  There were no vitals taken for this  visit.  Physical Exam Constitutional:      General: She is not in acute distress.    Appearance: She is well-developed. She is obese.  Musculoskeletal:        General: No deformity.  Neurological:     Mental Status: She is alert and oriented to person, place, and time.     Coordination: Coordination normal.  Psychiatric:        Attention and Perception: Attention and perception normal. She does not perceive auditory or visual hallucinations.        Mood and Affect: Mood is not anxious or depressed. Affect is not labile, blunt, angry or inappropriate.        Speech: Speech normal.        Behavior: Behavior normal.        Thought Content: Thought content normal. Thought content does not include homicidal or suicidal ideation. Thought content does not include homicidal or suicidal plan.        Cognition and Memory: Cognition normal. She exhibits impaired recent memory.        Judgment: Judgment normal.     Comments: Insight fair No delusions.      Lab Review:     Component Value Date/Time   NA 138 05/17/2019 1702   NA 140 01/24/2016 1335   K 3.9 05/17/2019 1702   K 4.3 01/24/2016 1335   CL 105 05/17/2019 1702   CL 97 (L) 07/15/2011 1413   CO2 17 (L) 05/17/2019 1702   CO2 21 (L) 01/24/2016 1335   GLUCOSE 171 (H) 05/17/2019 1702   GLUCOSE 133 (H) 04/24/2019 1337   GLUCOSE 108 01/24/2016 1335   GLUCOSE 86 07/15/2011 1413   BUN 12 05/17/2019 1702   BUN 15.4 01/24/2016 1335   CREATININE 0.85 05/17/2019 1702  CREATININE 1.21 (H) 04/24/2019 1337   CREATININE 1.0 01/24/2016 1335   CALCIUM 8.6 (L) 05/17/2019 1702   CALCIUM 8.7 01/24/2016 1335   PROT 6.2 (L) 04/24/2019 1337   PROT 6.4 03/23/2019 1151   PROT 7.1 01/24/2016 1335   ALBUMIN 3.8 04/24/2019 1337   ALBUMIN 4.1 03/23/2019 1151   ALBUMIN 3.5 01/24/2016 1335   AST 11 (L) 04/24/2019 1337   AST 16 01/24/2016 1335   ALT 10 04/24/2019 1337   ALT <9 01/24/2016 1335   ALKPHOS 199 (H) 04/24/2019 1337   ALKPHOS 302 (H)  01/24/2016 1335   BILITOT 0.3 04/24/2019 1337   BILITOT <0.30 01/24/2016 1335   GFRNONAA 72 05/17/2019 1702   GFRNONAA 47 (L) 04/24/2019 1337   GFRNONAA 67 04/17/2013 1135   GFRAA 83 05/17/2019 1702   GFRAA 54 (L) 04/24/2019 1337   GFRAA 78 04/17/2013 1135       Component Value Date/Time   WBC 8.3 06/28/2019 1416   WBC 13.7 (H) 02/24/2012 1620   RBC 4.27 06/28/2019 1417   RBC 4.27 06/28/2019 1416   HGB 11.8 (L) 06/28/2019 1416   HGB 9.6 (L) 05/17/2019 1702   HGB 6.9 (LL) 01/24/2016 1334   HGB 7.8 (L) 09/26/2007 1510   HCT 37.9 06/28/2019 1416   HCT 31.1 (L) 05/17/2019 1702   HCT 24.5 (L) 01/24/2016 1334   HCT 24.8 (L) 09/26/2007 1510   PLT 190 06/28/2019 1416   PLT 286 05/17/2019 1702   MCV 88.8 06/28/2019 1416   MCV 84 05/17/2019 1702   MCV 67 (L) 01/24/2016 1334   MCV 64.4 (L) 09/26/2007 1510   MCH 27.6 06/28/2019 1416   MCHC 31.1 06/28/2019 1416   RDW 16.2 (H) 06/28/2019 1416   RDW 19.8 (H) 05/17/2019 1702   RDW 18.1 (H) 01/24/2016 1334   RDW 17.3 (H) 09/26/2007 1510   LYMPHSABS 1.9 06/28/2019 1416   LYMPHSABS 0.8 05/17/2019 1702   LYMPHSABS 1.4 01/24/2016 1334   LYMPHSABS 1.6 09/26/2007 1510   MONOABS 0.5 06/28/2019 1416   MONOABS 0.5 09/26/2007 1510   EOSABS 0.2 06/28/2019 1416   EOSABS 0.1 05/17/2019 1702   EOSABS 0.3 01/24/2016 1334   BASOSABS 0.1 06/28/2019 1416   BASOSABS 0.0 05/17/2019 1702   BASOSABS 0.1 01/24/2016 1334   BASOSABS 0.0 09/26/2007 1510    Lithium Lvl  Date Value Ref Range Status  03/31/2010 0.27 (L) 0.80 - 1.40 mEq/L Final     No results found for: PHENYTOIN, PHENOBARB, VALPROATE, CBMZ   .res Assessment: Plan:    Erin Donaldson was seen today for follow-up, other and depression.  Diagnoses and all orders for this visit:  Bipolar II disorder (HCC)  Polyarthritis  Generalized anxiety disorder  Low serum vitamin D     Greater than 50% of 15 -minute face to face time with patient was spent on counseling and coordination of care.  We discussed the following.  Suportive therapy dealing with loss of relationship and isolation and feeling overwhelmed caring for a child.  Encourage increased activity and self-care to help with the above and also to help with her depression.  Patient has a history of bipolar disorder and had a manic episode with hospitalization as noted a few years ago.  She is taking 2 medications that should help with bipolar depression now including the quetiapine and lamotrigine.  Both her reasonably high dosages and it is unlikely to help to go any higher in those dosages.  She has been prescribed duloxetine for fibromyalgia by  another doctor.  The simplest solution at the last appointment was to increase duloxetine to try to help with both fibromyalgia and depression.  Discussed the risk of it triggering mania and discussed signs and symptoms of mania in detail.  Continue duloxetine to 60 mg daily.  It was helpful for depression.  It was not additionally helpful for pain Continue quetiapine thousand milligrams daily.  Discussed the high dose. Continue lamotrigine 200 mg daily No additional med changes today.  Discussed potential metabolic side effects associated with atypical antipsychotics, as well as potential risk for movement side effects. Advised pt to contact office if movement side effects occur.   Discussed signs and symptoms of mania and rapid cycling that could occur using an antidepressant in a patient with a history of bipolar disorder.  Because of the benefit she wants to continue the medication.  She will call if she has any of those symptoms.  Follow-up 6 mos  Meredith Staggers MD, DFAPA Please see After Visit Summary for patient specific instructions.  Future Appointments  Date Time Provider Department Center  11/03/2019 10:30 AM Raliegh Ip, DO [facility] None    No orders of the defined types were placed in this encounter.   -------------------------------

## 2019-11-02 ENCOUNTER — Other Ambulatory Visit: Payer: Self-pay

## 2019-11-03 ENCOUNTER — Encounter: Payer: Self-pay | Admitting: Family Medicine

## 2019-11-03 ENCOUNTER — Ambulatory Visit (INDEPENDENT_AMBULATORY_CARE_PROVIDER_SITE_OTHER): Payer: Medicare Other | Admitting: Family Medicine

## 2019-11-03 VITALS — BP 108/74 | HR 97 | Temp 96.8°F | Ht 64.0 in | Wt 158.0 lb

## 2019-11-03 DIAGNOSIS — Z23 Encounter for immunization: Secondary | ICD-10-CM | POA: Diagnosis not present

## 2019-11-03 DIAGNOSIS — E611 Iron deficiency: Secondary | ICD-10-CM

## 2019-11-03 DIAGNOSIS — E538 Deficiency of other specified B group vitamins: Secondary | ICD-10-CM

## 2019-11-03 DIAGNOSIS — E1169 Type 2 diabetes mellitus with other specified complication: Secondary | ICD-10-CM | POA: Diagnosis not present

## 2019-11-03 DIAGNOSIS — J0110 Acute frontal sinusitis, unspecified: Secondary | ICD-10-CM | POA: Diagnosis not present

## 2019-11-03 DIAGNOSIS — E785 Hyperlipidemia, unspecified: Secondary | ICD-10-CM

## 2019-11-03 LAB — BAYER DCA HB A1C WAIVED: HB A1C (BAYER DCA - WAIVED): 6.1 % (ref <7.0)

## 2019-11-03 MED ORDER — AMOXICILLIN-POT CLAVULANATE 875-125 MG PO TABS: 1.0000 | ORAL_TABLET | Freq: Two times a day (BID) | ORAL | 0 refills | Status: DC

## 2019-11-03 NOTE — Progress Notes (Signed)
Subjective: CC: est care, T2DM PCP: Erin Donaldson, Erin Harshman M, DO ZOX:WRUEHPI:Erin Donaldson is a 68 y.o. female presenting to clinic today for:  1. Type 2 Diabetes w/ HLD:  Patient reports onset of type 2 diabetes prior to her gastric bypass, which was performed over 16 years ago.  She has history of Roux-en-Y.  She is diet controlled.  Denies any history of hospitalization for uncontrolled blood sugars.  No history of foot ulcer or retinopathy.  She is not currently treated with cholesterol medicine, nor does she take any blood pressure medicine.  Last eye exam: She had done this year Last foot exam: Up-to-date Last A1c:  Lab Results  Component Value Date   HGBA1C 7.0 (H) 01/10/2019   Nephropathy screen indicated?: Needs Last flu, zoster and/or pneumovax: Prevnar today, posibly shingles  Immunization History  Administered Date(s) Administered  . Fluad Quad(high Dose 65+) 09/11/2019  . Influenza, High Dose Seasonal PF 08/24/2018  . Influenza,inj,Quad PF,6+ Mos 10/02/2016, 10/18/2017  . Pneumococcal Polysaccharide-23 06/04/2010  . Zoster 06/04/2010    ROS: Denies dizziness, LOC, polyuria, polydipsia, unintended weight loss/gain, foot ulcerations, numbness or tingling in extremities, shortness of breath or chest pain.  2.  History of gastric bypass Again, patient had history of Roux-en-Y 16 years ago.  She has since had difficulty with vitamin deficiencies.  She is currently treated with vitamin D, vitamin B12 injection monthly and Feraheme infusions.  She had 2 Feraheme infusions 2 months 6 months ago.  She is followed by Erin Donaldson.  She will need a hemoglobin and ferritin level today.  She unfortunately does not respond to oral vitamin replacement due to surgery as above.  3.  Major depressive disorder Patient with history of major depressive disorder.  She is managed by Dr. Jennelle Donaldson.  She does have history of hospitalization for this where she was sleep deprived for 3 days and was having  visual hallucinations.  She reports stability on Cymbalta, Seroquel and Lamictal.  She knows the warning signs for exacerbations and who to call.  4.  Sinusitis Patient reports several day history of sinus pain.  She points to the frontal and maxillary sinuses as the area of pain.  Denies any nasal purulence, fevers, cough or congestion. Has been using her normal home medicines which include Flonase and an over-the-counter sinus medicine  ROS: Per HPI  Allergies  Allergen Reactions  . Bactrim [Sulfamethoxazole-Trimethoprim]     Raw and irritatated in mouth.  Leodis Liverpool. Darvocet [Propoxyphene N-Acetaminophen]   . Decongestant [Oxymetazoline]   . Elavil [Amitriptyline]   . Lithium Other (See Comments)    Hx toxicity  . Risperidone And Related   . Ultram [Tramadol]    Past Medical History:  Diagnosis Date  . Anemia   . Anxiety   . Bipolar affective (HCC)   . Depression   . Diabetes in pregnancy   . Diabetes mellitus   . Elevated LFTs   . GERD (gastroesophageal reflux disease)   . Hyperlipidemia   . Hypertension   . IBS (irritable bowel syndrome)   . Migraine     Current Outpatient Medications:  .  cyclobenzaprine (FLEXERIL) 10 MG tablet, TAKE 1/2 TO 1 TABLET BY MOUTH TWICE A DAY AS DIRECTED, Disp: 30 tablet, Rfl: 2 .  donepezil (ARICEPT) 10 MG tablet, TAKE 1 TABLET BY MOUTH EVERY DAY, Disp: 90 tablet, Rfl: 1 .  DULoxetine (CYMBALTA) 60 MG capsule, Take 1 capsule (60 mg total) by mouth daily., Disp: 90 capsule, Rfl: 0 .  fluticasone (FLONASE) 50 MCG/ACT nasal spray, SPRAY 2 SPRAYS INTO EACH NOSTRIL EVERY DAY, Disp: 48 mL, Rfl: 1 .  furosemide (LASIX) 20 MG tablet, TAKE 1 TABLET BY MOUTH EVERY DAY, Disp: 90 tablet, Rfl: 1 .  gabapentin (NEURONTIN) 300 MG capsule, 900 mg at bedtime. , Disp: , Rfl:  .  lamoTRIgine (LAMICTAL) 200 MG tablet, TAKE 1 TABLET BY MOUTH EVERY DAY, Disp: 90 tablet, Rfl: 1 .  meclizine (ANTIVERT) 25 MG tablet, TAKE 1 TABLET (25 MG TOTAL) BY MOUTH 3 (THREE) TIMES  DAILY AS NEEDED FOR DIZZINESS., Disp: 30 tablet, Rfl: 0 .  nystatin cream (MYCOSTATIN), APPLY TO AFFECTED AREA TWICE A DAY, Disp: 30 g, Rfl: 0 .  pantoprazole (PROTONIX) 40 MG tablet, TAKE 2 TABLETS (80 MG TOTAL) BY MOUTH DAILY., Disp: 180 tablet, Rfl: 0 .  QUEtiapine (SEROQUEL) 200 MG tablet, , Disp: , Rfl:  .  QUEtiapine (SEROQUEL) 400 MG tablet, TAKE 2 TABLETS (800 MG TOTAL) BY MOUTH AT BEDTIME., Disp: 180 tablet, Rfl: 2 .  rizatriptan (MAXALT) 10 MG tablet, Take 1 tablet (10 mg total) by mouth as needed. May repeat in 2 hours if needed, Disp: 10 tablet, Rfl: 5 .  Vitamin D, Ergocalciferol, (DRISDOL) 1.25 MG (50000 UT) CAPS capsule, TAKE 1 CAPSULE (50,000 UNITS TOTAL) BY MOUTH 2 (TWO) TIMES A WEEK., Disp: , Rfl: 3  Current Facility-Administered Medications:  .  cyanocobalamin ((VITAMIN B-12)) injection 1,000 mcg, 1,000 mcg, Intramuscular, Q30 days, Erin Lipson M, DO, 1,000 mcg at 09/11/19 5809 Social History   Socioeconomic History  . Marital status: Widowed    Spouse name: Not on file  . Number of children: 1  . Years of education: college  . Highest education level: Not on file  Occupational History  . Occupation: disabled    Comment: [employer]   Tobacco Use  . Smoking status: Never Smoker  . Smokeless tobacco: Never Used  Substance and Sexual Activity  . Alcohol use: No    Alcohol/week: 0.0 standard drinks    Comment: rare  . Drug use: No  . Sexual activity: Not Currently  Other Topics Concern  . Not on file  Social History Narrative   Erin Donaldson is a widow, her husband passed away in his sleep in 03-28-2012.   She currently has custody of her great grandson, who is 62 years old.  She actually delivered her great-grandson at home.   Social Determinants of Health   Financial Resource Strain: Low Risk   . Difficulty of Paying Living Expenses: Not hard at all  Food Insecurity: No Food Insecurity  . Worried About Charity fundraiser in the Last Year: Never true  . Ran Out of  Food in the Last Year: Never true  Transportation Needs: No Transportation Needs  . Lack of Transportation (Medical): No  . Lack of Transportation (Non-Medical): No  Physical Activity: Inactive  . Days of Exercise per Week: 0 days  . Minutes of Exercise per Session: 0 min  Stress: Stress Concern Present  . Feeling of Stress : Very much  Social Connections: Unknown  . Frequency of Communication with Friends and Family: More than three times a week  . Frequency of Social Gatherings with Friends and Family: Once a week  . Attends Religious Services: Patient refused  . Active Member of Clubs or Organizations: No  . Attends Archivist Meetings: Never  . Marital Status: Widowed  Intimate Partner Violence: Not At Risk  . Fear of Current or Ex-Partner: No  .  Emotionally Abused: No  . Physically Abused: No  . Sexually Abused: No   Family History  Problem Relation Age of Onset  . Kidney disease Father   . Heart disease Father 56       CABG  . Diabetes Father   . CAD Father   . Heart disease Mother 25       CAD  . Breast cancer Mother   . Cancer Mother        bladder  . Heart disease Maternal Grandfather   . Heart attack Maternal Grandfather   . Stroke Maternal Grandfather   . Heart disease Brother   . Dementia Brother   . Heart disease Maternal Aunt   . Heart disease Maternal Uncle   . Heart disease Paternal Aunt   . Heart disease Paternal Uncle   . Diabetes Maternal Aunt   . Diabetes Maternal Uncle   . Diabetes Paternal Aunt   . Diabetes Paternal Uncle   . Stroke Maternal Grandmother   . Heart disease Paternal Grandfather   . Diabetes Daughter   . Drug abuse Daughter   . Depression Sister   . Hypertension Sister   . Alcohol abuse Brother   . Drug abuse Brother   . Other Brother        accident caused brain swelling   . Colon cancer Neg Hx     Objective: Office vital signs reviewed. BP 108/74   Pulse 97   Temp (!) 96.8 F (36 C) (Temporal)   Ht   (1.626 Donaldson)   Wt 158 lb (71.7 kg)   SpO2 97%   BMI 27.12 kg/Donaldson   Physical Examination:  General: Awake, alert, well nourished, No acute distress HEENT: Normal, sclera white, no carotid bruits  Ears: Tympanic membranes intact bilaterally.  Normal light reflex.  No purulence.  No erythema.  Nasal: No gross purulence appreciated.  No erythema.  No gross swelling at the nasal mucosa.  Mouth: Moist mucous membranes.  No erythema. Cardio: regular rate and rhythm, S1S2 heard, no murmurs appreciated Pulm: clear to auscultation bilaterally, no wheezes, rhonchi or rales; normal work of breathing on room air Extremities: warm, well perfused, No edema, cyanosis or clubbing; +2 pulses bilaterally Psych: Mood stable, speech normal, affect appropriate, pleasant and interactive.  Good eye contact. Depression screen [facility] 2/9 11/03/2019 09/12/2019 05/17/2019  Decreased Interest 3 3 0  Down, Depressed, Hopeless 3 3 0  PHQ - 2 Score 6 6 0  Altered sleeping 0 0 -  Tired, decreased energy 1 1 -  Change in appetite 0 0 -  Feeling bad or failure about yourself  1 1 -  Trouble concentrating 0 1 -  Moving slowly or fidgety/restless 0 1 -  Suicidal thoughts - 0 -  PHQ-9 Score 8 10 -  Difficult doing work/chores Somewhat difficult Very difficult -  Some recent data might be hidden   Assessment/ Plan: 68 y.o. female   1. Type 2 diabetes mellitus with hyperlipidemia (HCC) Under excellent control with drop in her A1c from 7-6.1 today.  She is diet controlled. - hgba1c - Microalbumin / creatinine urine ratio  2. Vitamin B 12 deficiency Secondary to Roux-en-Y and now malabsorptive disorder.  Vitamin B12 injection administered - Vitamin B12  3. Iron deficiency Secondary to Roux-en-Y and now malabsorptive disorder.  Check iron and CBC.  Plan to CC to Dr. Myna Hidalgo - CBC with Differential - Ferritin  4. Acute non-recurrent frontal sinusitis I have advised her to continue  home care conservatively.  If no  substantial improvement or if worsening may proceed with Augmentin, which has been sent to the pharmacy - amoxicillin-clavulanate (AUGMENTIN) 875-125 MG tablet; Take 1 tablet by mouth 2 (two) times daily.  Dispense: 20 tablet; Refill: 0   Orders Placed This Encounter  Procedures  . hgba1c  . CBC with Differential  . Ferritin  . Vitamin B12  . Microalbumin / creatinine urine ratio   Meds ordered this encounter  Medications  . amoxicillin-clavulanate (AUGMENTIN) 875-125 MG tablet    Sig: Take 1 tablet by mouth 2 (two) times daily.    Dispense:  20 tablet    Refill:  0     Zyshawn Bohnenkamp Hulen Skains, DO [facility] [phone]

## 2019-11-03 NOTE — Addendum Note (Signed)
Addended byCarrolyn Leigh on: 11/03/2019 02:35 PM   Modules accepted: Orders

## 2019-11-04 LAB — CBC WITH DIFFERENTIAL/PLATELET
Basophils Absolute: 0 10*3/uL (ref 0.0–0.2)
Basos: 1 %
EOS (ABSOLUTE): 0.2 10*3/uL (ref 0.0–0.4)
Eos: 2 %
Hematocrit: 40.2 % (ref 34.0–46.6)
Hemoglobin: 13.6 g/dL (ref 11.1–15.9)
Immature Grans (Abs): 0 10*3/uL (ref 0.0–0.1)
Immature Granulocytes: 0 %
Lymphocytes Absolute: 1.3 10*3/uL (ref 0.7–3.1)
Lymphs: 19 %
MCH: 30.5 pg (ref 26.6–33.0)
MCHC: 33.8 g/dL (ref 31.5–35.7)
MCV: 90 fL (ref 79–97)
Monocytes Absolute: 0.5 10*3/uL (ref 0.1–0.9)
Monocytes: 7 %
Neutrophils Absolute: 5.1 10*3/uL (ref 1.4–7.0)
Neutrophils: 71 %
Platelets: 248 10*3/uL (ref 150–450)
RBC: 4.46 x10E6/uL (ref 3.77–5.28)
RDW: 12.3 % (ref 11.7–15.4)
WBC: 7.1 10*3/uL (ref 3.4–10.8)

## 2019-11-04 LAB — VITAMIN B12: Vitamin B-12: 474 pg/mL (ref 232–1245)

## 2019-11-04 LAB — FERRITIN: Ferritin: 109 ng/mL (ref 15–150)

## 2019-11-11 ENCOUNTER — Other Ambulatory Visit: Payer: Self-pay | Admitting: Family Medicine

## 2019-11-11 DIAGNOSIS — K219 Gastro-esophageal reflux disease without esophagitis: Secondary | ICD-10-CM

## 2019-11-12 ENCOUNTER — Other Ambulatory Visit: Payer: Self-pay | Admitting: Psychiatry

## 2019-11-12 DIAGNOSIS — M130 Polyarthritis, unspecified: Secondary | ICD-10-CM

## 2019-11-21 ENCOUNTER — Telehealth: Payer: Self-pay | Admitting: Family Medicine

## 2019-11-21 NOTE — Telephone Encounter (Signed)
Labs reviewed with patient.

## 2019-11-22 ENCOUNTER — Other Ambulatory Visit: Payer: Self-pay | Admitting: Family Medicine

## 2019-11-22 DIAGNOSIS — J0100 Acute maxillary sinusitis, unspecified: Secondary | ICD-10-CM

## 2019-11-24 ENCOUNTER — Other Ambulatory Visit: Payer: Self-pay | Admitting: Family Medicine

## 2019-11-27 ENCOUNTER — Other Ambulatory Visit: Payer: Self-pay

## 2019-11-27 ENCOUNTER — Ambulatory Visit: Payer: Medicare Other | Attending: Internal Medicine

## 2019-11-27 DIAGNOSIS — Z20822 Contact with and (suspected) exposure to covid-19: Secondary | ICD-10-CM

## 2019-11-28 LAB — NOVEL CORONAVIRUS, NAA: SARS-CoV-2, NAA: DETECTED — AB

## 2019-11-29 ENCOUNTER — Encounter: Payer: Self-pay | Admitting: Family Medicine

## 2019-11-29 ENCOUNTER — Other Ambulatory Visit: Payer: Self-pay | Admitting: Family Medicine

## 2019-11-29 ENCOUNTER — Ambulatory Visit (INDEPENDENT_AMBULATORY_CARE_PROVIDER_SITE_OTHER): Payer: Medicare Other | Admitting: Family Medicine

## 2019-11-29 ENCOUNTER — Telehealth: Payer: Self-pay | Admitting: Family Medicine

## 2019-11-29 DIAGNOSIS — M549 Dorsalgia, unspecified: Secondary | ICD-10-CM

## 2019-11-29 DIAGNOSIS — U071 COVID-19: Secondary | ICD-10-CM | POA: Diagnosis not present

## 2019-11-29 MED ORDER — ALBUTEROL SULFATE HFA 108 (90 BASE) MCG/ACT IN AERS: 2.0000 | INHALATION_SPRAY | Freq: Four times a day (QID) | RESPIRATORY_TRACT | 0 refills | Status: DC | PRN

## 2019-11-29 NOTE — Patient Instructions (Signed)
Prevent the Spread of COVID-19 if You Are Sick If you are sick with COVID-19 or think you might have COVID-19, follow the steps below to care for yourself and to help protect other people in your home and community. Stay home except to get medical care.  Stay home. Most people with COVID-19 have mild illness and are able to recover at home without medical care. Do not leave your home, except to get medical care. Do not visit public areas.  Take care of yourself. Get rest and stay hydrated. Take over-the-counter medicines, such as acetaminophen, to help you feel better.  Stay in touch with your doctor. Call before you get medical care. Be sure to get care if you have trouble breathing, or have any other emergency warning signs, or if you think it is an emergency.  Avoid public transportation, ride-sharing, or taxis. Separate yourself from other people and pets in your home.  As much as possible, stay in a specific room and away from other people and pets in your home. Also, you should use a separate bathroom, if available. If you need to be around other people or animals in or outside of the home, wear a mask. ? See COVID-19 and Animals if you have questions about pets:www.cdc.gov/coronavirus/2019-ncov/faq.html#COVID19animals. ? Additional guidance is available for those living in close quarters. (https://www.cdc.gov/coronavirus/2019-ncov/daily-life-coping/living-in-close-quarters.html) and shared housing (https://www.cdc.gov/coronavirus/2019-ncov/daily-life-coping/shared-housing/index.html). Monitor your symptoms.  Symptoms of COVID-19 include fever, cough, and shortness of breath but other symptoms may be present as well.  Follow care instructions from your healthcare provider and local health department. Your local health authorities will give instructions on checking your symptoms and reporting information. When to Seek Emergency Medical Attention Look for emergency warning signs* for  COVID-19. If someone is showing any of these signs, seek emergency medical care immediately:  Trouble breathing  Persistent pain or pressure in the chest  New confusion  Bluish lips or face  Inability to wake or stay awake *This list is not all possible symptoms. Please call your medical provider for any other symptoms that are severe or concerning to you. Call 911 or call ahead to your local emergency facility: Notify the operator that you are seeking care for someone who has or may have COVID-19. Call ahead before visiting your doctor.  Call ahead. Many medical visits for routine care are being postponed or done by phone or telemedicine.  If you have a medical appointment that cannot be postponed, call your doctor's office, and tell them you have or may have COVID-19. If you are sick, wear a mask over your nose and mouth.  You should wear a mask over your nose and mouth if you must be around other people or animals, including pets (even at home).  You don't need to wear the mask if you are alone. If you can't put on a mask (because of trouble breathing for example), cover your coughs and sneezes in some other way. Try to stay at least 6 feet away from other people. This will help protect the people around you.  Masks should not be placed on young children under age 2 years, anyone who has trouble breathing, or anyone who is not able to remove the mask without help. Note: During the COVID-19 pandemic, medical grade facemasks are reserved for healthcare workers and some first responders. You may need to make a mask using a scarf or bandana. Cover your coughs and sneezes.  Cover your mouth and nose with a tissue when you cough or sneeze.    Throw used tissues in a lined trash can.  Immediately wash your hands with soap and water for at least 20 seconds. If soap and water are not available, clean your hands with an alcohol-based hand sanitizer that contains at least 60% alcohol. Clean  your hands often.  Wash your hands often with soap and water for at least 20 seconds. This is especially important after blowing your nose, coughing, or sneezing; going to the bathroom; and before eating or preparing food.  Use hand sanitizer if soap and water are not available. Use an alcohol-based hand sanitizer with at least 60% alcohol, covering all surfaces of your hands and rubbing them together until they feel dry.  Soap and water are the best option, especially if your hands are visibly dirty.  Avoid touching your eyes, nose, and mouth with unwashed hands. Avoid sharing personal household items.  Do not share dishes, drinking glasses, cups, eating utensils, towels, or bedding with other people in your home.  Wash these items thoroughly after using them with soap and water or put them in the dishwasher. Clean all "high-touch" surfaces everyday.  Clean and disinfect high-touch surfaces in your "sick room" and bathroom. Let someone else clean and disinfect surfaces in common areas, but not your bedroom and bathroom.  If a caregiver or other person needs to clean and disinfect a sick person's bedroom or bathroom, they should do so on an as-needed basis. The caregiver/other person should wear a mask and wait as long as possible after the sick person has used the bathroom. High-touch surfaces include phones, remote controls, counters, tabletops, doorknobs, bathroom fixtures, toilets, keyboards, tablets, and bedside tables.  Clean and disinfect areas that may have blood, stool, or body fluids on them.  Use household cleaners and disinfectants. Clean the area or item with soap and water or another detergent if it is dirty. Then use a household disinfectant. ? Be sure to follow the instructions on the label to ensure safe and effective use of the product. Many products recommend keeping the surface wet for several minutes to ensure germs are killed. Many also recommend precautions such as  wearing gloves and making sure you have good ventilation during use of the product. ? Most EPA-registered household disinfectants should be effective. When you can be around others after you had or likely had COVID-19 When you can be around others (end home isolation) depends on different factors for different situations.  I think or know I had COVID-19, and I had symptoms ? You can be with others after  24 hours with no fever AND  Symptoms improved AND  10 days since symptoms first appeared ? Depending on your healthcare provider's advice and availability of testing, you might get tested to see if you still have COVID-19. If you will be tested, you can be around others when you have no fever, symptoms have improved, and you receive two negative test results in a row, at least 24 hours apart.  I tested positive for COVID-19 but had no symptoms ? If you continue to have no symptoms, you can be with others after:  10 days have passed since test ? Depending on your healthcare provider's advice and availability of testing, you might get tested to see if you still have COVID-19. If you will be tested, you can be around others after you receive two negative test results in a row, at least 24 hours apart. ? If you develop symptoms after testing positive, follow the guidance above for "  I think or know I had COVID, and I had symptoms." cdc.gov/coronavirus 06/27/2019 This information is not intended to replace advice given to you by your health care provider. Make sure you discuss any questions you have with your health care provider. Document Revised: 07/13/2019 Document Reviewed: 05/16/2019 Elsevier Patient Education  2020 [employer] Inc.  

## 2019-11-29 NOTE — Telephone Encounter (Signed)
Scheduled televisit appt for patient this afternoon. Patient verbalized understanding

## 2019-11-29 NOTE — Progress Notes (Signed)
Virtual Visit via Telephone Note  I connected with Erin Donaldson on 11/29/19 at 1:17 PM by telephone and verified that I am speaking with the correct person using two identifiers. Erin Donaldson is currently located at home and nobody is currently with her during this visit. The provider, Gwenlyn Fudge, FNP is located in their home at time of visit.  I discussed the limitations, risks, security and privacy concerns of performing an evaluation and management service by telephone and the availability of in person appointments. I also discussed with the patient that there may be a patient responsible charge related to this service. The patient expressed understanding and agreed to proceed.  Subjective: PCP: Raliegh Ip, DO  Chief Complaint  Patient presents with  . URI   Patient complains of cough, headache and body aches. Onset of symptoms was 6 days ago, gradually worsening since that time.  She had testing for COVID-19 and was made aware she was positive today. She is drinking plenty of fluids.  She does not smoke.    ROS: Per HPI  Current Outpatient Medications:  .  amoxicillin-clavulanate (AUGMENTIN) 875-125 MG tablet, Take 1 tablet by mouth 2 (two) times daily., Disp: 20 tablet, Rfl: 0 .  cyclobenzaprine (FLEXERIL) 10 MG tablet, TAKE 1/2 TO 1 TABLET BY MOUTH TWICE A DAY AS DIRECTED, Disp: 30 tablet, Rfl: 2 .  donepezil (ARICEPT) 10 MG tablet, TAKE 1 TABLET BY MOUTH EVERY DAY, Disp: 90 tablet, Rfl: 1 .  DULoxetine (CYMBALTA) 60 MG capsule, TAKE 1 CAPSULE BY MOUTH EVERY DAY, Disp: 90 capsule, Rfl: 0 .  fluticasone (FLONASE) 50 MCG/ACT nasal spray, SPRAY 2 SPRAYS INTO EACH NOSTRIL EVERY DAY, Disp: 48 mL, Rfl: 1 .  furosemide (LASIX) 20 MG tablet, TAKE 1 TABLET BY MOUTH EVERY DAY, Disp: 90 tablet, Rfl: 1 .  gabapentin (NEURONTIN) 300 MG capsule, 900 mg at bedtime. , Disp: , Rfl:  .  lamoTRIgine (LAMICTAL) 200 MG tablet, TAKE 1 TABLET BY MOUTH EVERY DAY, Disp: 90 tablet, Rfl:  1 .  meclizine (ANTIVERT) 25 MG tablet, TAKE 1 TABLET (25 MG TOTAL) BY MOUTH 3 (THREE) TIMES DAILY AS NEEDED FOR DIZZINESS., Disp: 30 tablet, Rfl: 0 .  nystatin cream (MYCOSTATIN), APPLY TO AFFECTED AREA TWICE A DAY, Disp: 30 g, Rfl: 0 .  pantoprazole (PROTONIX) 40 MG tablet, TAKE 2 TABLETS BY MOUTH EVERY DAY, Disp: 180 tablet, Rfl: 0 .  QUEtiapine (SEROQUEL) 200 MG tablet, , Disp: , Rfl:  .  QUEtiapine (SEROQUEL) 400 MG tablet, TAKE 2 TABLETS (800 MG TOTAL) BY MOUTH AT BEDTIME., Disp: 180 tablet, Rfl: 2 .  rizatriptan (MAXALT) 10 MG tablet, Take 1 tablet (10 mg total) by mouth as needed. May repeat in 2 hours if needed, Disp: 10 tablet, Rfl: 5 .  Vitamin D, Ergocalciferol, (DRISDOL) 1.25 MG (50000 UT) CAPS capsule, TAKE 1 CAPSULE (50,000 UNITS TOTAL) BY MOUTH 2 (TWO) TIMES A WEEK., Disp: , Rfl: 3  Current Facility-Administered Medications:  .  cyanocobalamin ((VITAMIN B-12)) injection 1,000 mcg, 1,000 mcg, Intramuscular, Q30 days, Gottschalk, Ashly M, DO, 1,000 mcg at 11/03/19 1431  Allergies  Allergen Reactions  . Bactrim [Sulfamethoxazole-Trimethoprim]     Raw and irritatated in mouth.  Leodis [place] [Propoxyphene N-Acetaminophen]   . Decongestant [Oxymetazoline]   . Elavil [Amitriptyline]   . Lithium Other (See Comments)    Hx toxicity  . Risperidone And Related   . Ultram [Tramadol]    Past Medical History:  Diagnosis Date  .  Anemia   . Anxiety   . Bipolar affective ([place])   . Depression   . Diabetes in pregnancy   . Diabetes mellitus   . Elevated LFTs   . GERD (gastroesophageal reflux disease)   . Hyperlipidemia   . Hypertension   . IBS (irritable bowel syndrome)   . Migraine     Observations/Objective: A&O  No respiratory distress or wheezing audible over the phone Mood, judgement, and thought processes all WNL  Assessment and Plan: 1. COVID-19 - Discussed symptom management. Education provided on preventing the spread of COVID-19. Will have staff call infusion  clinic to see if she is a candidate due to age and diabetes.  - MyChart COVID-19 home monitoring program; Future - Temperature monitoring; Future   Follow Up Instructions:  I discussed the assessment and treatment plan with the patient. The patient was provided an opportunity to ask questions and all were answered. The patient agreed with the plan and demonstrated an understanding of the instructions.   The patient was advised to call back or seek an in-person evaluation if the symptoms worsen or if the condition fails to improve as anticipated.  The above assessment and management plan was discussed with the patient. The patient verbalized understanding of and has agreed to the management plan. Patient is aware to call the clinic if symptoms persist or worsen. Patient is aware when to return to the clinic for a follow-up visit. Patient educated on when it is appropriate to go to the emergency department.   Time call ended: 1:25 PM  I provided 12 minutes of non-face-to-face time during this encounter.  Hendricks Limes, MSN, APRN, FNP-C [facility] 11/29/19

## 2019-11-29 NOTE — Telephone Encounter (Signed)
What is the name of the medication? inhaler Have you contacted your pharmacy to request a refill?no  Which pharmacy would you [facility] sent to[facility]   Patient notified that their request is being sent to the clinical staff for review and that they should receive a call once it is complete. If they do not receive a call within 24 hours they can check with their pharmacy or our office.

## 2019-11-29 NOTE — Telephone Encounter (Signed)
Pt had telephone visit with Britney today for positive Covid. She is now requesting an inhaler. Please advise

## 2019-11-29 NOTE — Telephone Encounter (Signed)
I will send in an albuterol inhaler to the [facility] pharmacy as requested.  However, as far as I know patient does not have an underlying lung disease.  I do not see where shortness of breath or wheezing was mentioned in her office note from today.  If she has new onset shortness of breath, wheezing she is to seek immediate medical attention at one of the respiratory clinics or emergency department.  This may indicate a secondary pneumonia

## 2019-11-30 ENCOUNTER — Telehealth: Payer: Self-pay | Admitting: Nurse Practitioner

## 2019-11-30 NOTE — Progress Notes (Signed)
Left detailed message for office to call us back about patient.

## 2019-11-30 NOTE — Telephone Encounter (Signed)
Called to Discuss with patient about Covid symptoms and the use of bamlanivimab, a monoclonal antibody infusion for those with mild to moderate Covid symptoms and at a high risk of hospitalization.     Symptoms started over 10 days ago.

## 2019-11-30 NOTE — Telephone Encounter (Signed)
Patient aware and verbalizes understanding. 

## 2019-12-04 ENCOUNTER — Encounter: Payer: Self-pay | Admitting: Family

## 2019-12-04 ENCOUNTER — Ambulatory Visit (INDEPENDENT_AMBULATORY_CARE_PROVIDER_SITE_OTHER): Payer: Medicare Other | Admitting: Family

## 2019-12-04 DIAGNOSIS — U071 COVID-19: Secondary | ICD-10-CM

## 2019-12-04 DIAGNOSIS — J029 Acute pharyngitis, unspecified: Secondary | ICD-10-CM

## 2019-12-04 MED ORDER — DEXAMETHASONE 6 MG PO TABS: 6.0000 mg | ORAL_TABLET | Freq: Every day | ORAL | 0 refills | Status: DC

## 2019-12-04 MED ORDER — AMOXICILLIN-POT CLAVULANATE 875-125 MG PO TABS: 1.0000 | ORAL_TABLET | Freq: Two times a day (BID) | ORAL | 0 refills | Status: DC

## 2019-12-04 MED ORDER — ALBUTEROL SULFATE HFA 108 (90 BASE) MCG/ACT IN AERS: 2.0000 | INHALATION_SPRAY | Freq: Four times a day (QID) | RESPIRATORY_TRACT | 0 refills | Status: DC | PRN

## 2019-12-04 NOTE — Progress Notes (Signed)
Virtual Visit via telephone Note Due to COVID-19 pandemic this visit was conducted virtually. This visit type was conducted due to national recommendations for restrictions regarding the COVID-19 Pandemic (e.g. social distancing, sheltering in place) in an effort to limit this patient's exposure and mitigate transmission in our community. All issues noted in this document were discussed and addressed.  A physical exam was not performed with this format.  I connected with Erin Donaldson on 12/04/19 at 3:30 pm by telephone and verified that I am speaking with the correct person using two identifiers. Erin Donaldson is currently located at home and no one is currently with  her during visit. The provider, [name], FNP is located in their office at time of visit.  I discussed the limitations, risks, security and privacy concerns of performing an evaluation and management service by telephone and the availability of in person appointments. I also discussed with the patient that there may be a patient responsible charge related to this service. The patient expressed understanding and agreed to proceed.   History and Present Illness:  Pt call the office today with worsening symptoms. She is COVID positive and her symptoms started on 01/ 08/21.  Cough This is a new problem. The current episode started 1 to 4 weeks ago. The problem has been gradually worsening. The problem occurs every few minutes. The cough is non-productive. Associated symptoms include myalgias, a sore throat ("white patches") and shortness of breath ("when doing too much"). Pertinent negatives include no chills, ear congestion, ear pain, fever, headaches, nasal congestion, postnasal drip or wheezing. She has tried rest and OTC cough suppressant for the symptoms. The treatment provided mild relief.      Review of Systems  Constitutional: Negative for chills and fever.  HENT: Positive for sore throat ("white patches").  Negative for ear pain and postnasal drip.   Respiratory: Positive for cough and shortness of breath ("when doing too much"). Negative for wheezing.   Musculoskeletal: Positive for myalgias.  Neurological: Negative for headaches.  All other systems reviewed and are negative.    Observations/Objective: No SOB or distress noted   Assessment and Plan: Erin Donaldson comes in today with chief complaint of No chief complaint on file.   Diagnosis and orders addressed:  1. COVID-19 virus detected - amoxicillin-clavulanate (AUGMENTIN) 875-125 MG tablet; Take 1 tablet by mouth 2 (two) times daily.  Dispense: 14 tablet; Refill: 0 - dexamethasone (DECADRON) 6 MG tablet; Take 1 tablet (6 mg total) by mouth daily.  Dispense: 6 tablet; Refill: 0 - albuterol (VENTOLIN HFA) 108 (90 Base) MCG/ACT inhaler; Inhale 2 puffs into the lungs every 6 (six) hours as needed for wheezing or shortness of breath.  Dispense: 8 g; Refill: 0  2. Acute pharyngitis, unspecified etiology Worrisome for strep given white patches and sore throat New toothbrush in 3 days Call if symptoms worsen or do not improve  - amoxicillin-clavulanate (AUGMENTIN) 875-125 MG tablet; Take 1 tablet by mouth 2 (two) times daily.  Dispense: 14 tablet; Refill: 0 - dexamethasone (DECADRON) 6 MG tablet; Take 1 tablet (6 mg total) by mouth daily.  Dispense: 6 tablet; Refill: 0 - albuterol (VENTOLIN HFA) 108 (90 Base) MCG/ACT inhaler; Inhale 2 puffs into the lungs every 6 (six) hours as needed for wheezing or shortness of breath.  Dispense: 8 g; Refill: 0      I discussed the assessment and treatment plan with the patient. The patient was provided an opportunity to ask questions and  all were answered. The patient agreed with the plan and demonstrated an understanding of the instructions.   The patient was advised to call back or seek an in-person evaluation if the symptoms worsen or if the condition fails to improve as anticipated.  The  above assessment and management plan was discussed with the patient. The patient verbalized understanding of and has agreed to the management plan. Patient is aware to call the clinic if symptoms persist or worsen. Patient is aware when to return to the clinic for a follow-up visit. Patient educated on when it is appropriate to go to the emergency department.   Time call ended:  3:45 pm  I provided 15 minutes of non-face-to-face time during this encounter.    Jannifer Rodney, FNP

## 2019-12-04 NOTE — Progress Notes (Signed)
Patient does not qualify for infusion.  Patient still complains of sore throat, dyspnea and fatigue but overall feeling some better.

## 2019-12-07 ENCOUNTER — Other Ambulatory Visit: Payer: Medicare Other

## 2019-12-07 ENCOUNTER — Ambulatory Visit: Payer: Medicare Other | Attending: Internal Medicine

## 2019-12-07 ENCOUNTER — Other Ambulatory Visit: Payer: Self-pay

## 2019-12-07 DIAGNOSIS — Z20822 Contact with and (suspected) exposure to covid-19: Secondary | ICD-10-CM

## 2019-12-08 LAB — NOVEL CORONAVIRUS, NAA: SARS-CoV-2, NAA: DETECTED — AB

## 2019-12-14 ENCOUNTER — Other Ambulatory Visit: Payer: Medicare Other

## 2019-12-15 ENCOUNTER — Ambulatory Visit (INDEPENDENT_AMBULATORY_CARE_PROVIDER_SITE_OTHER): Payer: Medicare Other | Admitting: Family

## 2019-12-15 ENCOUNTER — Other Ambulatory Visit: Payer: Self-pay

## 2019-12-15 ENCOUNTER — Encounter: Payer: Self-pay | Admitting: Family

## 2019-12-15 DIAGNOSIS — R399 Unspecified symptoms and signs involving the genitourinary system: Secondary | ICD-10-CM

## 2019-12-15 MED ORDER — CEPHALEXIN 500 MG PO CAPS: 500.0000 mg | ORAL_CAPSULE | Freq: Two times a day (BID) | ORAL | 0 refills | Status: DC

## 2019-12-15 NOTE — Progress Notes (Signed)
   Virtual Visit via telephone Note Due to COVID-19 pandemic this visit was conducted virtually. This visit type was conducted due to national recommendations for restrictions regarding the COVID-19 Pandemic (e.g. social distancing, sheltering in place) in an effort to limit this patient's exposure and mitigate transmission in our community. All issues noted in this document were discussed and addressed.  A physical exam was not performed with this format.  I connected with Erin Donaldson on 12/15/19 at 12:02 pm by telephone and verified that I am speaking with the correct person using two identifiers. [facility] and no one is currently with her during visit. The provider, Jannifer Rodney, FNP is located in their office at time of visit.  I discussed the limitations, risks, security and privacy concerns of performing an evaluation and management service by telephone and the availability of in person appointments. I also discussed with the patient that there may be a patient responsible charge related to this service. The patient expressed understanding and agreed to proceed.   History and Present Illness:  Dysuria  This is a new problem. The current episode started yesterday. The problem occurs every urination. The problem has been gradually worsening. The quality of the pain is described as burning. The pain is at a severity of 7/10. The pain is moderate. There has been no fever. Associated symptoms include frequency, hesitancy and urgency. Pertinent negatives include no discharge, flank pain, hematuria, nausea or vomiting. She has tried increased fluids for the symptoms. The treatment provided mild relief.      Review of Systems  Gastrointestinal: Negative for nausea and vomiting.  Genitourinary: Positive for dysuria, frequency, hesitancy and urgency. Negative for flank pain and hematuria.     Observations/Objective: No SOB or distress noted    Assessment and Plan: 1. UTI symptoms Force fluids AZO over the counter X2 days RTO if symptoms worsen or do not improve  - cephALEXin (KEFLEX) 500 MG capsule; Take 1 capsule (500 mg total) by mouth 2 (two) times daily.  Dispense: 14 capsule; Refill: 0     I discussed the assessment and treatment plan with the patient. The patient was provided an opportunity to ask questions and all were answered. The patient agreed with the plan and demonstrated an understanding of the instructions.   The patient was advised to call back or seek an in-person evaluation if the symptoms worsen or if the condition fails to improve as anticipated.  The above assessment and management plan was discussed with the patient. The patient verbalized understanding of and has agreed to the management plan. Patient is aware to call the clinic if symptoms persist or worsen. Patient is aware when to return to the clinic for a follow-up visit. Patient educated on when it is appropriate to go to the emergency department.   Time call ended:  12:13 pm   I provided 11 minutes of non-face-to-face time during this encounter.    Jannifer Rodney, FNP

## 2019-12-16 ENCOUNTER — Other Ambulatory Visit: Payer: Self-pay | Admitting: Psychiatry

## 2019-12-29 ENCOUNTER — Other Ambulatory Visit: Payer: Self-pay | Admitting: Family Medicine

## 2020-01-01 ENCOUNTER — Ambulatory Visit (INDEPENDENT_AMBULATORY_CARE_PROVIDER_SITE_OTHER): Payer: Medicare Other | Admitting: Family Medicine

## 2020-01-01 DIAGNOSIS — G9331 Postviral fatigue syndrome: Secondary | ICD-10-CM

## 2020-01-01 DIAGNOSIS — G933 Postviral fatigue syndrome: Secondary | ICD-10-CM | POA: Diagnosis not present

## 2020-01-01 NOTE — Patient Instructions (Signed)
I think you have something called post viral fatigue syndrome.  This can occur after an illness, particularly with COVID-19.  Some patients have experienced fatigue and change in breathing outside of their baseline for several weeks to months after contracting the illness.  Sometimes these changes are permanent but often they do resolve after several weeks to months.  If anything worsens or if you develop any of the other worrisome symptoms or signs we discussed, please do not hesitate to reach out to me.

## 2020-01-01 NOTE — Progress Notes (Signed)
Telephone visit  Subjective: CC: fatigue PCP: Janora Norlander, DO RJJ:OACZ B Carbo is a 69 y.o. female calls for telephone consult today. Patient provides verbal consent for consult held via phone.  Due to COVID-19 pandemic this visit was conducted virtually. This visit type was conducted due to national recommendations for restrictions regarding the COVID-19 Pandemic (e.g. social distancing, sheltering in place) in an effort to limit this patient's exposure and mitigate transmission in our community. All issues noted in this document were discussed and addressed.  A physical exam was not performed with this format.   Location of patient: home Location of provider: Working remotely from home Others present for call: none  1. Fatigue Patient diagnosed with COVID in January.  She reports having been fatigued and winded during that time.  She has ongoing fatigue and some dyspnea on exertion.  Symptoms are gradually improving but she has not returned to baseline and she was worried that perhaps she was having a low iron or other metabolic abnormality.  She was unsure if this was typical after COVID-19 infection.  No fevers, cough, congestion, runny nose.   ROS: Per HPI  Allergies  Allergen Reactions  . Bactrim [Sulfamethoxazole-Trimethoprim]     Raw and irritatated in mouth.  Carlton Adam [Propoxyphene N-Acetaminophen]   . Decongestant [Oxymetazoline]   . Elavil [Amitriptyline]   . Lithium Other (See Comments)    Hx toxicity  . Risperidone And Related   . Ultram [Tramadol]    Past Medical History:  Diagnosis Date  . Anemia   . Anxiety   . Bipolar affective ([place])   . Depression   . Diabetes in pregnancy   . Diabetes mellitus   . Elevated LFTs   . GERD (gastroesophageal reflux disease)   . Hyperlipidemia   . Hypertension   . IBS (irritable bowel syndrome)   . Migraine     Current Outpatient Medications:  .  albuterol (VENTOLIN HFA) 108 (90 Base) MCG/ACT inhaler, Inhale  2 puffs into the lungs every 6 (six) hours as needed for wheezing or shortness of breath., Disp: 8 g, Rfl: 0 .  cyclobenzaprine (FLEXERIL) 10 MG tablet, TAKE 1/2 TO 1 TABLET BY MOUTH TWICE A DAY AS DIRECTED, Disp: 30 tablet, Rfl: 2 .  donepezil (ARICEPT) 10 MG tablet, TAKE 1 TABLET BY MOUTH EVERY DAY, Disp: 90 tablet, Rfl: 1 .  DULoxetine (CYMBALTA) 60 MG capsule, TAKE 1 CAPSULE BY MOUTH EVERY DAY, Disp: 90 capsule, Rfl: 0 .  fluticasone (FLONASE) 50 MCG/ACT nasal spray, SPRAY 2 SPRAYS INTO EACH NOSTRIL EVERY DAY, Disp: 48 mL, Rfl: 1 .  furosemide (LASIX) 20 MG tablet, TAKE 1 TABLET BY MOUTH EVERY DAY, Disp: 90 tablet, Rfl: 1 .  gabapentin (NEURONTIN) 300 MG capsule, 900 mg at bedtime. , Disp: , Rfl:  .  lamoTRIgine (LAMICTAL) 200 MG tablet, TAKE 1 TABLET BY MOUTH EVERY DAY, Disp: 90 tablet, Rfl: 1 .  meclizine (ANTIVERT) 25 MG tablet, TAKE 1 TABLET (25 MG TOTAL) BY MOUTH 3 (THREE) TIMES DAILY AS NEEDED FOR DIZZINESS., Disp: 30 tablet, Rfl: 0 .  nystatin cream (MYCOSTATIN), APPLY TO AFFECTED AREA TWICE A DAY, Disp: 30 g, Rfl: 0 .  pantoprazole (PROTONIX) 40 MG tablet, TAKE 2 TABLETS BY MOUTH EVERY DAY, Disp: 180 tablet, Rfl: 0 .  QUEtiapine (SEROQUEL) 200 MG tablet, , Disp: , Rfl:  .  QUEtiapine (SEROQUEL) 400 MG tablet, TAKE 2 TABLETS (800 MG TOTAL) BY MOUTH AT BEDTIME., Disp: 180 tablet, Rfl: 2 .  rizatriptan (  MAXALT) 10 MG tablet, Take 1 tablet (10 mg total) by mouth as needed. May repeat in 2 hours if needed, Disp: 10 tablet, Rfl: 5 .  Vitamin D, Ergocalciferol, (DRISDOL) 1.25 MG (50000 UT) CAPS capsule, TAKE 1 CAPSULE (50,000 UNITS TOTAL) BY MOUTH 2 (TWO) TIMES A WEEK., Disp: , Rfl: 3  Current Facility-Administered Medications:  .  cyanocobalamin ((VITAMIN B-12)) injection 1,000 mcg, 1,000 mcg, Intramuscular, Q30 days, Delynn Flavin M, DO, 1,000 mcg at 11/03/19 1431  Assessment/ Plan: 69 y.o. female   1. Postviral fatigue syndrome Unlikely to be vitamin abnormalities given normal  ferritin, B12 and hemoglobin in December.  I have a higher suspicion that she is experiencing a post viral fatigue syndrome given recent COVID-19 infection.  We discussed the expected timeline of this.  We discussed reasons for evaluation.  Patient voiced understanding will follow up as needed   Start time: 2:39pm End time: 2:46pm  Total time spent on patient care (including telephone call/ virtual visit): 15 minutes  Aerionna Moravek Hulen Skains, DO [facility] [phone]

## 2020-01-10 ENCOUNTER — Other Ambulatory Visit: Payer: Self-pay | Admitting: Family

## 2020-01-12 ENCOUNTER — Telehealth: Payer: Self-pay | Admitting: Family Medicine

## 2020-01-12 NOTE — Chronic Care Management (AMB) (Signed)
  Chronic Care Management   Note  01/12/2020 Name: Erin Donaldson MRN: [id number] DOB: 07-Jan-1951  Erin Donaldson is a 69 y.o. year old female who is a primary care patient of Janora Norlander, DO. I reached out to [name] by phone today in response to a referral sent by Ms. Rosaria Ferries health plan.     Ms. Hellickson was given information about Chronic Care Management services today including:  1. CCM service includes personalized support from designated clinical staff supervised by her physician, including individualized plan of care and coordination with other care providers 2. 24/7 contact phone numbers for assistance for urgent and routine care needs. 3. Service will only be billed when office clinical staff spend 20 minutes or more in a month to coordinate care. 4. Only one practitioner may furnish and bill the service in a calendar month. 5. The patient may stop CCM services at any time (effective at the end of the month) by phone call to the office staff. 6. The patient will be responsible for cost sharing (co-pay) of up to 20% of the service fee (after annual deductible is met).  Patient agreed to services and verbal consent obtained.   Follow up plan: Telephone appointment with care management team member scheduled for:02/27/2020  Noreene Larsson, [place], [place], [place] [address] Direct Dial: [phone] [email or web address]: [email or web address]

## 2020-01-25 ENCOUNTER — Telehealth (INDEPENDENT_AMBULATORY_CARE_PROVIDER_SITE_OTHER): Payer: Medicare Other | Admitting: Family Medicine

## 2020-01-25 ENCOUNTER — Encounter: Payer: Self-pay | Admitting: Family Medicine

## 2020-01-25 DIAGNOSIS — H9203 Otalgia, bilateral: Secondary | ICD-10-CM

## 2020-01-25 MED ORDER — AMOXICILLIN-POT CLAVULANATE 875-125 MG PO TABS: 1.0000 | ORAL_TABLET | Freq: Two times a day (BID) | ORAL | 0 refills | Status: AC

## 2020-01-25 NOTE — Progress Notes (Signed)
Virtual Visit via MyChart Video Note Due to COVID-19 pandemic this visit was conducted virtually. This visit type was conducted due to national recommendations for restrictions regarding the COVID-19 Pandemic (e.g. social distancing, sheltering in place) in an effort to limit this patient's exposure and mitigate transmission in our community. All issues noted in this document were discussed and addressed.  A physical exam was not performed with this format.   I connected with Erin Donaldson on 01/25/2020 at 1005 by MyChart Video and verified that I am speaking with the correct person using two identifiers. Erin Donaldson is currently located at home and no one is currently with them during visit. The provider, Kari Baars, FNP is located in their office at time of visit.  I discussed the limitations, risks, security and privacy concerns of performing an evaluation and management service by MyChart Video and the availability of in person appointments. I also discussed with the patient that there may be a patient responsible charge related to this service. The patient expressed understanding and agreed to proceed.  Subjective:  Patient ID: Erin Donaldson, female    DOB: 29-Sep-1951, 69 y.o.   MRN: [id number]  Chief Complaint:  Otalgia   HPI: Erin Donaldson is a 69 y.o. female presenting on 01/25/2020 for Otalgia   Pt reports bilateral otalgia that is worsening daily. No reported sick exposures. Ongoing for over 10 days.   Otalgia  There is pain in both ears. This is a new problem. The current episode started 1 to 4 weeks ago. The problem occurs constantly. The problem has been gradually worsening. The pain is at a severity of 5/10. The pain is moderate. Associated symptoms include headaches. Pertinent negatives include no abdominal pain, coughing, diarrhea, ear discharge, hearing loss, neck pain, rash, rhinorrhea, sore throat or vomiting. She has tried acetaminophen for the symptoms. The  treatment provided no relief.     Relevant past medical, surgical, family, and social history reviewed and updated as indicated.  Allergies and medications reviewed and updated.   Past Medical History:  Diagnosis Date  . Anemia   . Anxiety   . Bipolar affective ([facility])   . Depression   . Diabetes in pregnancy   . Diabetes mellitus   . Elevated LFTs   . GERD (gastroesophageal reflux disease)   . Hyperlipidemia   . Hypertension   . IBS (irritable bowel syndrome)   . Migraine     Past Surgical History:  Procedure Laterality Date  . CHOLECYSTECTOMY    . GASTRIC BYPASS  02-29-04    Social History   Socioeconomic History  . Marital status: Widowed    Spouse name: Not on file  . Number of children: 1  . Years of education: college  . Highest education level: Not on file  Occupational History  . Occupation: disabled    Comment: [employer]   Tobacco Use  . Smoking status: Never Smoker  . Smokeless tobacco: Never Used  Substance and Sexual Activity  . Alcohol use: No    Alcohol/week: 0.0 standard drinks    Comment: rare  . Drug use: Yes    Types: Mescaline  . Sexual activity: Not Currently  Other Topics Concern  . Not on file  Social History Narrative   Erin Donaldson is a widow, her husband passed away in his sleep in 02-29-2012.   She currently has custody of her great grandson, who is 18 years old.  She actually delivered her great-grandson at home.  Social Determinants of Health   Financial Resource Strain: Low Risk   . Difficulty of Paying Living Expenses: Not hard at all  Food Insecurity: No Food Insecurity  . Worried About Charity fundraiser in the Last Year: Never true  . Ran Out of Food in the Last Year: Never true  Transportation Needs: No Transportation Needs  . Lack of Transportation (Medical): No  . Lack of Transportation (Non-Medical): No  Physical Activity: Inactive  . Days of Exercise per Week: 0 days  . Minutes of Exercise per Session: 0 min  Stress: Stress  Concern Present  . Feeling of Stress : Very much  Social Connections: Unknown  . Frequency of Communication with Friends and Family: More than three times a week  . Frequency of Social Gatherings with Friends and Family: Once a week  . Attends Religious Services: Patient refused  . Active Member of Clubs or Organizations: No  . Attends Archivist Meetings: Never  . Marital Status: Widowed  Intimate Partner Violence: Not At Risk  . Fear of Current or Ex-Partner: No  . Emotionally Abused: No  . Physically Abused: No  . Sexually Abused: No    Outpatient Encounter Medications as of 01/25/2020  Medication Sig  . albuterol (VENTOLIN HFA) 108 (90 Base) MCG/ACT inhaler Inhale 2 puffs into the lungs every 6 (six) hours as needed for wheezing or shortness of breath.  Marland Kitchen amoxicillin-clavulanate (AUGMENTIN) 875-125 MG tablet Take 1 tablet by mouth 2 (two) times daily for 10 days.  . cyclobenzaprine (FLEXERIL) 10 MG tablet TAKE 1/2 TO 1 TABLET BY MOUTH TWICE A DAY AS DIRECTED  . donepezil (ARICEPT) 10 MG tablet TAKE 1 TABLET BY MOUTH EVERY DAY  . DULoxetine (CYMBALTA) 60 MG capsule TAKE 1 CAPSULE BY MOUTH EVERY DAY  . fluticasone (FLONASE) 50 MCG/ACT nasal spray SPRAY 2 SPRAYS INTO EACH NOSTRIL EVERY DAY  . furosemide (LASIX) 20 MG tablet TAKE 1 TABLET BY MOUTH EVERY DAY  . gabapentin (NEURONTIN) 300 MG capsule 900 mg at bedtime.   . lamoTRIgine (LAMICTAL) 200 MG tablet TAKE 1 TABLET BY MOUTH EVERY DAY  . meclizine (ANTIVERT) 25 MG tablet TAKE 1 TABLET (25 MG TOTAL) BY MOUTH 3 (THREE) TIMES DAILY AS NEEDED FOR DIZZINESS.  Marland Kitchen nystatin cream (MYCOSTATIN) APPLY TO AFFECTED AREA TWICE A DAY  . pantoprazole (PROTONIX) 40 MG tablet TAKE 2 TABLETS BY MOUTH EVERY DAY  . QUEtiapine (SEROQUEL) 200 MG tablet   . QUEtiapine (SEROQUEL) 400 MG tablet TAKE 2 TABLETS (800 MG TOTAL) BY MOUTH AT BEDTIME.  . rizatriptan (MAXALT) 10 MG tablet Take 1 tablet (10 mg total) by mouth as needed. May repeat in 2  hours if needed  . Vitamin D, Ergocalciferol, (DRISDOL) 1.25 MG (50000 UT) CAPS capsule TAKE 1 CAPSULE (50,000 UNITS TOTAL) BY MOUTH 2 (TWO) TIMES A WEEK.   Facility-Administered Encounter Medications as of 01/25/2020  Medication  . cyanocobalamin ((VITAMIN B-12)) injection 1,000 mcg    Allergies  Allergen Reactions  . Bactrim [Sulfamethoxazole-Trimethoprim]     Raw and irritatated in mouth.  Carlton Adam [Propoxyphene N-Acetaminophen]   . Decongestant [Oxymetazoline]   . Elavil [Amitriptyline]   . Lithium Other (See Comments)    Hx toxicity  . Risperidone And Related   . Ultram [Tramadol]     Review of Systems  Constitutional: Negative for activity change, appetite change, chills, diaphoresis, fatigue, fever and unexpected weight change.  HENT: Positive for ear pain. Negative for ear discharge, hearing loss, rhinorrhea and  sore throat.   Eyes: Negative.  Negative for photophobia and visual disturbance.  Respiratory: Negative for cough, chest tightness and shortness of breath.   Cardiovascular: Negative for chest pain, palpitations and leg swelling.  Gastrointestinal: Negative for abdominal pain, blood in stool, constipation, diarrhea, nausea and vomiting.  Endocrine: Negative.   Genitourinary: Negative for decreased urine volume, difficulty urinating, dysuria, frequency and urgency.  Musculoskeletal: Negative for arthralgias, myalgias and neck pain.  Skin: Negative.  Negative for rash.  Allergic/Immunologic: Negative.   Neurological: Positive for headaches. Negative for dizziness, tremors, seizures, syncope, facial asymmetry, speech difficulty, weakness, light-headedness and numbness.  Hematological: Positive for adenopathy. Does not bruise/bleed easily.  Psychiatric/Behavioral: Negative for confusion, hallucinations, sleep disturbance and suicidal ideas.  All other systems reviewed and are negative.        Observations/Objective: No vital signs or physical exam, this was a  telephone or virtual health encounter.  Pt alert and oriented, answers all questions appropriately, and able to speak in full sentences.    Assessment and Plan: [place] was seen today for otalgia.  Diagnoses and all orders for this visit:  Acute otalgia, bilateral Ongoing and worsening bilateral otalgia despite symptomatic care at home. Due to length of symptoms without improvement will initiate below for likely bacterial otitis media. Pt aware to report any new,worsening, or persistent symptoms. Follow up for reevaluation.  -     amoxicillin-clavulanate (AUGMENTIN) 875-125 MG tablet; Take 1 tablet by mouth 2 (two) times daily for 10 days.     Follow Up Instructions: Return in about 2 weeks (around 02/08/2020), or if symptoms worsen or fail to improve, for otalgia.    I discussed the assessment and treatment plan with the patient. The patient was provided an opportunity to ask questions and all were answered. The patient agreed with the plan and demonstrated an understanding of the instructions.   The patient was advised to call back or seek an in-person evaluation if the symptoms worsen or if the condition fails to improve as anticipated.  The above assessment and management plan was discussed with the patient. The patient verbalized understanding of and has agreed to the management plan. Patient is aware to call the clinic if they develop any new symptoms or if symptoms persist or worsen. Patient is aware when to return to the clinic for a follow-up visit. Patient educated on when it is appropriate to go to the emergency department.    I provided 15 minutes of Video non-face-to-face time during this encounter. The video started at 1005. The video ended at 1015. The other time was used for coordination of care.    Kari Baars, FNP-C [facility] [address] [place], [place] [address] [phone] 01/25/2020

## 2020-02-02 ENCOUNTER — Other Ambulatory Visit: Payer: Self-pay | Admitting: Family Medicine

## 2020-02-05 ENCOUNTER — Other Ambulatory Visit: Payer: Self-pay | Admitting: Family Medicine

## 2020-02-05 DIAGNOSIS — M549 Dorsalgia, unspecified: Secondary | ICD-10-CM

## 2020-02-05 NOTE — Telephone Encounter (Signed)
Last visit (video) 01/25/2020 Last refill 12/01/2019, #30, 2 refills

## 2020-02-12 ENCOUNTER — Encounter: Payer: Self-pay | Admitting: Family Medicine

## 2020-02-12 ENCOUNTER — Other Ambulatory Visit: Payer: Self-pay | Admitting: Psychiatry

## 2020-02-12 ENCOUNTER — Ambulatory Visit (INDEPENDENT_AMBULATORY_CARE_PROVIDER_SITE_OTHER): Payer: Medicare Other | Admitting: Family Medicine

## 2020-02-12 DIAGNOSIS — H9203 Otalgia, bilateral: Secondary | ICD-10-CM

## 2020-02-12 DIAGNOSIS — H6593 Unspecified nonsuppurative otitis media, bilateral: Secondary | ICD-10-CM | POA: Diagnosis not present

## 2020-02-12 MED ORDER — CETIRIZINE HCL 10 MG PO TABS: 10.0000 mg | ORAL_TABLET | Freq: Every day | ORAL | 11 refills | Status: DC

## 2020-02-12 MED ORDER — FLUTICASONE PROPIONATE 50 MCG/ACT NA SUSP: NASAL | 5 refills | Status: AC

## 2020-02-12 NOTE — Progress Notes (Signed)
Virtual Visit via telephone Note Due to COVID-19 pandemic this visit was conducted virtually. This visit type was conducted due to national recommendations for restrictions regarding the COVID-19 Pandemic (e.g. social distancing, sheltering in place) in an effort to limit this patient's exposure and mitigate transmission in our community. All issues noted in this document were discussed and addressed.  A physical exam was not performed with this format.   I connected with Erin Donaldson on 02/12/2020 at [place] by telephone and verified that I am speaking with the correct person using two identifiers. Erin Donaldson is currently located at home and family is currently with them during visit. The provider, Monia Pouch, FNP is located in their office at time of visit.  I discussed the limitations, risks, security and privacy concerns of performing an evaluation and management service by telephone and the availability of in person appointments. I also discussed with the patient that there may be a patient responsible charge related to this service. The patient expressed understanding and agreed to proceed.  Subjective:  Patient ID: Erin Donaldson, female    DOB: 08-Jan-1951, 69 y.o.   MRN: [id number]  Chief Complaint:  Otalgia   HPI: Erin Donaldson is a 69 y.o. female presenting on 02/12/2020 for Otalgia   Feeling of fullness and pressure in bilateral ears post acute otitis media. No fever, chills, coryza, lymphadenopathy, or malaise.   Otalgia  There is pain in both ears. This is a recurrent problem. The current episode started 1 to 4 weeks ago. The problem occurs constantly. The problem has been waxing and waning. There has been no fever. The pain is at a severity of 2/10. The pain is mild. Pertinent negatives include no abdominal pain, coughing, diarrhea, ear discharge, headaches, hearing loss, neck pain, rash, rhinorrhea, sore throat or vomiting. She has tried antibiotics for the symptoms.  The treatment provided moderate relief.     Relevant past medical, surgical, family, and social history reviewed and updated as indicated.  Allergies and medications reviewed and updated.   Past Medical History:  Diagnosis Date  . Anemia   . Anxiety   . Bipolar affective ([place])   . Depression   . Diabetes in pregnancy   . Diabetes mellitus   . Elevated LFTs   . GERD (gastroesophageal reflux disease)   . Hyperlipidemia   . Hypertension   . IBS (irritable bowel syndrome)   . Migraine     Past Surgical History:  Procedure Laterality Date  . CHOLECYSTECTOMY    . GASTRIC BYPASS  Mar 06, 2004    Social History   Socioeconomic History  . Marital status: Widowed    Spouse name: Not on file  . Number of children: 1  . Years of education: college  . Highest education level: Not on file  Occupational History  . Occupation: disabled    Comment: [employer]   Tobacco Use  . Smoking status: Never Smoker  . Smokeless tobacco: Never Used  Substance and Sexual Activity  . Alcohol use: No    Alcohol/week: 0.0 standard drinks    Comment: rare  . Drug use: Yes    Types: Mescaline  . Sexual activity: Not Currently  Other Topics Concern  . Not on file  Social History Narrative   Ms. Goodell is a widow, her husband passed away in his sleep in 2012/03/06.   She currently has custody of her great grandson, who is 46 years old.  She actually delivered her great-grandson at home.  Social Determinants of Health   Financial Resource Strain: Low Risk   . Difficulty of Paying Living Expenses: Not hard at all  Food Insecurity: No Food Insecurity  . Worried About Programme researcher, broadcasting/film/video in the Last Year: Never true  . Ran Out of Food in the Last Year: Never true  Transportation Needs: No Transportation Needs  . Lack of Transportation (Medical): No  . Lack of Transportation (Non-Medical): No  Physical Activity: Inactive  . Days of Exercise per Week: 0 days  . Minutes of Exercise per Session: 0 min  Stress:  Stress Concern Present  . Feeling of Stress : Very much  Social Connections: Unknown  . Frequency of Communication with Friends and Family: More than three times a week  . Frequency of Social Gatherings with Friends and Family: Once a week  . Attends Religious Services: Patient refused  . Active Member of Clubs or Organizations: No  . Attends Banker Meetings: Never  . Marital Status: Widowed  Intimate Partner Violence: Not At Risk  . Fear of Current or Ex-Partner: No  . Emotionally Abused: No  . Physically Abused: No  . Sexually Abused: No    Outpatient Encounter Medications as of 02/12/2020  Medication Sig  . albuterol (VENTOLIN HFA) 108 (90 Base) MCG/ACT inhaler Inhale 2 puffs into the lungs every 6 (six) hours as needed for wheezing or shortness of breath.  . cetirizine (ZYRTEC) 10 MG tablet Take 1 tablet (10 mg total) by mouth daily.  . cyclobenzaprine (FLEXERIL) 10 MG tablet TAKE 1/2 TO 1 TABLET BY MOUTH TWICE A DAY AS DIRECTED  . donepezil (ARICEPT) 10 MG tablet TAKE 1 TABLET BY MOUTH EVERY DAY  . DULoxetine (CYMBALTA) 60 MG capsule TAKE 1 CAPSULE BY MOUTH EVERY DAY  . fluticasone (FLONASE) 50 MCG/ACT nasal spray SPRAY 2 SPRAYS INTO EACH NOSTRIL EVERY DAY  . furosemide (LASIX) 20 MG tablet TAKE 1 TABLET BY MOUTH EVERY DAY  . gabapentin (NEURONTIN) 300 MG capsule TAKE 3 CAPSULES BY MOUTH THREE TIMES A DAY  . lamoTRIgine (LAMICTAL) 200 MG tablet TAKE 1 TABLET BY MOUTH EVERY DAY  . meclizine (ANTIVERT) 25 MG tablet TAKE 1 TABLET (25 MG TOTAL) BY MOUTH 3 (THREE) TIMES DAILY AS NEEDED FOR DIZZINESS.  Marland Kitchen nystatin cream (MYCOSTATIN) APPLY TO AFFECTED AREA TWICE A DAY  . pantoprazole (PROTONIX) 40 MG tablet TAKE 2 TABLETS BY MOUTH EVERY DAY  . QUEtiapine (SEROQUEL) 200 MG tablet   . QUEtiapine (SEROQUEL) 400 MG tablet TAKE 2 TABLETS (800 MG TOTAL) BY MOUTH AT BEDTIME.  . rizatriptan (MAXALT) 10 MG tablet Take 1 tablet (10 mg total) by mouth as needed. May repeat in 2 hours  if needed  . Vitamin D, Ergocalciferol, (DRISDOL) 1.25 MG (50000 UT) CAPS capsule TAKE 1 CAPSULE (50,000 UNITS TOTAL) BY MOUTH 2 (TWO) TIMES A WEEK.  . [DISCONTINUED] fluticasone (FLONASE) 50 MCG/ACT nasal spray SPRAY 2 SPRAYS INTO EACH NOSTRIL EVERY DAY   Facility-Administered Encounter Medications as of 02/12/2020  Medication  . cyanocobalamin ((VITAMIN B-12)) injection 1,000 mcg    Allergies  Allergen Reactions  . Bactrim [Sulfamethoxazole-Trimethoprim]     Raw and irritatated in mouth.  Leodis Liverpool [Propoxyphene N-Acetaminophen]   . Decongestant [Oxymetazoline]   . Elavil [Amitriptyline]   . Lithium Other (See Comments)    Hx toxicity  . Risperidone And Related   . Ultram [Tramadol]     Review of Systems  Constitutional: Negative for activity change, appetite change, chills, diaphoresis, fatigue, fever  and unexpected weight change.  HENT: Positive for ear pain. Negative for congestion, dental problem, drooling, ear discharge, facial swelling, hearing loss, mouth sores, nosebleeds, postnasal drip, rhinorrhea, sinus pressure, sinus pain, sneezing, sore throat, tinnitus, trouble swallowing and voice change.   Eyes: Negative.  Negative for photophobia and visual disturbance.  Respiratory: Negative for cough, chest tightness and shortness of breath.   Cardiovascular: Negative for chest pain, palpitations and leg swelling.  Gastrointestinal: Negative for abdominal pain, blood in stool, constipation, diarrhea, nausea and vomiting.  Endocrine: Negative.   Genitourinary: Negative for dysuria, frequency and urgency.  Musculoskeletal: Negative for arthralgias, myalgias and neck pain.  Skin: Negative.  Negative for rash.  Allergic/Immunologic: Negative.   Neurological: Negative for dizziness and headaches.  Hematological: Negative.   Psychiatric/Behavioral: Negative for confusion, hallucinations, sleep disturbance and suicidal ideas.  All other systems reviewed and are negative.         Observations/Objective: No vital signs or physical exam, this was a telephone or virtual health encounter.  Pt alert and oriented, answers all questions appropriately, and able to speak in full sentences.    Assessment and Plan: Minette was seen today for otalgia.  Diagnoses and all orders for this visit:  Acute otalgia, bilateral Bilateral otitis media with effusion Recently completed antibiotics for acute otitis media. Pt has residual ear fullness and occasional otalgia. Likely due to effusion post infection. Will treat with below. Symptomatic care discussed in detail. Pt aware of symptoms that require follow up or immediate evaluation. Medications as prescribed. Report any new, worsening, or persistent symptoms.  -     cetirizine (ZYRTEC) 10 MG tablet; Take 1 tablet (10 mg total) by mouth daily. -     fluticasone (FLONASE) 50 MCG/ACT nasal spray; SPRAY 2 SPRAYS INTO EACH NOSTRIL EVERY DAY     Follow Up Instructions: Return if symptoms worsen or fail to improve.    I discussed the assessment and treatment plan with the patient. The patient was provided an opportunity to ask questions and all were answered. The patient agreed with the plan and demonstrated an understanding of the instructions.   The patient was advised to call back or seek an in-person evaluation if the symptoms worsen or if the condition fails to improve as anticipated.  The above assessment and management plan was discussed with the patient. The patient verbalized understanding of and has agreed to the management plan. Patient is aware to call the clinic if they develop any new symptoms or if symptoms persist or worsen. Patient is aware when to return to the clinic for a follow-up visit. Patient educated on when it is appropriate to go to the emergency department.    I provided 15 minutes of non-face-to-face time during this encounter. The call started at 0940. The call ended at 0955. The other time was used for  coordination of care.    Kari Baars, FNP-C [facility] [address] [place], [place] [address] [phone] 02/12/2020

## 2020-02-13 ENCOUNTER — Other Ambulatory Visit: Payer: Self-pay | Admitting: Psychiatry

## 2020-02-13 DIAGNOSIS — M130 Polyarthritis, unspecified: Secondary | ICD-10-CM

## 2020-02-19 ENCOUNTER — Ambulatory Visit (INDEPENDENT_AMBULATORY_CARE_PROVIDER_SITE_OTHER): Payer: Medicare Other | Admitting: Family Medicine

## 2020-02-19 ENCOUNTER — Encounter: Payer: Self-pay | Admitting: Family Medicine

## 2020-02-19 DIAGNOSIS — N12 Tubulo-interstitial nephritis, not specified as acute or chronic: Secondary | ICD-10-CM | POA: Diagnosis not present

## 2020-02-19 MED ORDER — CIPROFLOXACIN HCL 500 MG PO TABS: 500.0000 mg | ORAL_TABLET | Freq: Two times a day (BID) | ORAL | 0 refills | Status: DC

## 2020-02-19 NOTE — Progress Notes (Signed)
Subjective:    Patient ID: Erin Donaldson, female    DOB: January 03, 1951, 69 y.o.   MRN: [id number]   HPI: Erin Donaldson is a 69 y.o. female presenting for fell last night. Had a spell feeling cold and shivering. Last night lasted 1-2 hours. Another this morning. Denies any fever. Balance gets off. Then gets really tired. Foor over one week urine is really cloudy. Has frequency of urine. Up X 5 last night.    Depression screen [facility] 2/9 11/03/2019 09/12/2019 05/17/2019 12/21/2018 11/23/2018  Decreased Interest 3 3 0 1 3  Down, Depressed, Hopeless 3 3 0 1 3  PHQ - 2 Score 6 6 0 2 6  Altered sleeping 0 0 - 0 0  Tired, decreased energy 1 1 - 2 2  Change in appetite 0 0 - 0 0  Feeling bad or failure about yourself  1 1 - 1 2  Trouble concentrating 0 1 - 2 2  Moving slowly or fidgety/restless 0 1 - 0 0  Suicidal thoughts - 0 - 0 0  PHQ-9 Score 8 10 - 7 12  Difficult doing work/chores Somewhat difficult Very difficult - Somewhat difficult Somewhat difficult  Some recent data might be hidden     Relevant past medical, surgical, family and social history reviewed and updated as indicated.  Interim medical history since our last visit reviewed. Allergies and medications reviewed and updated.  ROS:  Review of Systems  Constitutional: Negative.   HENT: Negative.   Eyes: Negative for visual disturbance.  Respiratory: Negative for shortness of breath.   Cardiovascular: Negative for chest pain.  Gastrointestinal: Negative for abdominal pain.  Musculoskeletal: Negative for arthralgias.     Social History   Tobacco Use  Smoking Status Never Smoker  Smokeless Tobacco Never Used       Objective:     Wt Readings from Last 3 Encounters:  11/03/19 158 lb (71.7 kg)  06/28/19 152 lb (68.9 kg)  05/17/19 153 lb 6.4 oz (69.6 kg)     Exam deferred. Pt. Harboring due to COVID 19. Phone visit performed.   Assessment & Plan:   1. Pyelonephritis     Meds ordered this encounter    Medications  . ciprofloxacin (CIPRO) 500 MG tablet    Sig: Take 1 tablet (500 mg total) by mouth 2 (two) times daily.    Dispense:  20 tablet    Refill:  0    No orders of the defined types were placed in this encounter.     Diagnoses and all orders for this visit:  Pyelonephritis  Other orders -     ciprofloxacin (CIPRO) 500 MG tablet; Take 1 tablet (500 mg total) by mouth 2 (two) times daily.    Virtual Visit via telephone Note  I discussed the limitations, risks, security and privacy concerns of performing an evaluation and management service by telephone and the availability of in person appointments. The patient was identified with two identifiers. Pt.expressed understanding and agreed to proceed. Pt. Is at home. Dr. Darlyn Read is in his office.  Follow Up Instructions:   I discussed the assessment and treatment plan with the patient. The patient was provided an opportunity to ask questions and all were answered. The patient agreed with the plan and demonstrated an understanding of the instructions.   The patient was advised to call back or seek an in-person evaluation if the symptoms worsen or if the condition fails to improve as anticipated.   Total  minutes including chart review and phone contact time: 16   Follow up plan: Return if symptoms worsen or fail to improve.  Claretta Fraise, MD [place]

## 2020-02-24 ENCOUNTER — Other Ambulatory Visit: Payer: Self-pay | Admitting: Family Medicine

## 2020-02-27 ENCOUNTER — Ambulatory Visit: Payer: Medicare Other | Admitting: *Deleted

## 2020-02-27 DIAGNOSIS — E1169 Type 2 diabetes mellitus with other specified complication: Secondary | ICD-10-CM

## 2020-02-27 DIAGNOSIS — E785 Hyperlipidemia, unspecified: Secondary | ICD-10-CM

## 2020-02-27 DIAGNOSIS — I10 Essential (primary) hypertension: Secondary | ICD-10-CM

## 2020-02-27 NOTE — Chronic Care Management (AMB) (Signed)
  Chronic Care Management   Initial Visit Outreach  02/27/2020 Name: Erin Donaldson MRN: [id number] DOB: [birth year]  Referred by: Raliegh Ip, DO Reason for referral : Chronic Care Management (Initial Visit)   An unsuccessful Initial Telephone Visit was attempted today. The patient was referred to the case management team for assistance with care management and care coordination.   RN Care Plan   . Patient Stated       CARE PLAN ENTRY (see longtitudinal plan of care for additional care plan information)  Current Barriers:  . Chronic Disease Management support, education, and care coordination needs related to HTN, Migraine, GERD, DM, HLD, Anxiety, Depression, bipolar affective, iron deficiency anemia  Clinical Goals: . Over the next 10 days, patient will be contacted by a Care Guide to reschedule their Initial CCM Visit . Over the next 30 days, patient will have an Initial CCM Visit with a member of the embedded CCM team to discuss self-management of their chronic medical conditions  Interventions: . Chart reviewed in preparation for initial visit telephone call . Collaboration with other care team members as needed . Unsuccessful outreach to patient  . A HIPPA compliant phone message was left for the patient providing contact information and requesting a return call.  . Request sent to care guides to reach out and reschedule patient's initial visit  Patient Self Care Activities: . Undetermined   Initial goal documentation        Follow Up Plan:  . A HIPPA compliant phone message was left for the patient providing contact information and requesting a return call.  . Request sent to care guides to reach out and reschedule patient's initial visit  Demetrios Loll, BSN, RN-BC Embedded Chronic Care Manager [facility] Care Management Direct Dial: [phone]

## 2020-02-27 NOTE — Patient Instructions (Signed)
Visit Information  Goals Addressed            This Visit's Progress   . Patient Stated       CARE PLAN ENTRY (see longtitudinal plan of care for additional care plan information)  Current Barriers:  . Chronic Disease Management support, education, and care coordination needs related to HTN, Migraine, GERD, DM, HLD, Anxiety, Depression, bipolar affective, iron deficiency anemia  Clinical Goals: . Over the next 10 days, patient will be contacted by a Care Guide to reschedule their Initial CCM Visit . Over the next 30 days, patient will have an Initial CCM Visit with a member of the embedded CCM team to discuss self-management of their chronic medical conditions  Interventions: . Chart reviewed in preparation for initial visit telephone call . Collaboration with other care team members as needed . Unsuccessful outreach to patient  . A HIPPA compliant phone message was left for the patient providing contact information and requesting a return call.  . Request sent to care guides to reach out and reschedule patient's initial visit  Patient Self Care Activities: . Undetermined   Initial goal documentation        The patient verbalized understanding of instructions provided today and declined a print copy of patient instruction materials.   Follow-up Plan . A HIPPA compliant phone message was left for the patient providing contact information and requesting a return call.  . Request sent to care guides to reach out and reschedule patient's initial visit  Demetrios Loll, BSN, RN-BC Embedded Chronic Care Manager [facility] / [facility] Care Management Direct Dial: [phone]

## 2020-03-04 ENCOUNTER — Telehealth: Payer: Self-pay | Admitting: Family Medicine

## 2020-03-04 ENCOUNTER — Encounter: Payer: Self-pay | Admitting: Family Medicine

## 2020-03-04 ENCOUNTER — Telehealth (INDEPENDENT_AMBULATORY_CARE_PROVIDER_SITE_OTHER): Payer: Medicare Other | Admitting: Family Medicine

## 2020-03-04 DIAGNOSIS — R220 Localized swelling, mass and lump, head: Secondary | ICD-10-CM | POA: Diagnosis not present

## 2020-03-04 DIAGNOSIS — M7989 Other specified soft tissue disorders: Secondary | ICD-10-CM

## 2020-03-04 NOTE — Progress Notes (Addendum)
MyChart Video visit  Subjective: CC: facial swelling PCP: Janora Norlander, DO UMP:NTIR Erin Donaldson is a 69 y.o. female. Patient provides verbal consent for consult held via video.  Due to COVID-19 pandemic this visit was conducted virtually. This visit type was conducted due to national recommendations for restrictions regarding the COVID-19 Pandemic (e.g. social distancing, sheltering in place) in an effort to limit this patient's exposure and mitigate transmission in our community. All issues noted in this document were discussed and addressed.  A physical exam was not performed with this format.   Location of patient: home Location of provider: [facility] Others present for call: none  1. Facial swelling Patient reports a 1 day history of significant right-sided facial swelling.  She notes that this was preceded by several weeks of ear pressure/pain.  She has difficulty opening mouth fully due to pain but is able to get her dentures and without difficulty.  No fevers.  She is status post Cipro for pyelonephritis.  She was treated with Augmentin in March as well for presumed otitis media.  No worsening facial swelling with consumption of food but it is overall getting progressively worse throughout the day.  She has used ibuprofen with little improvement in swelling.  Not currently using any cold or hot compresses.  She reports radiation of pain to the anterior neck but does not report any stiffness in the neck.  2.  Right upper extremity mass Patient reports also a right upper extremity lump that is present along the lateral aspect of the upper arm between her shoulder and elbow.  This is causing some aching in the arm.  ROS: Per HPI  Allergies  Allergen Reactions  . Bactrim [Sulfamethoxazole-Trimethoprim]     Raw and irritatated in mouth.  Carlton Adam [Propoxyphene N-Acetaminophen]   . Decongestant [Oxymetazoline]   . Elavil [Amitriptyline]   . Lithium Other (See Comments)    Hx toxicity   . Risperidone And Related   . Ultram [Tramadol]    Past Medical History:  Diagnosis Date  . Anemia   . Anxiety   . Bipolar affective ([place])   . Depression   . Diabetes in pregnancy   . Diabetes mellitus   . Elevated LFTs   . GERD (gastroesophageal reflux disease)   . Hyperlipidemia   . Hypertension   . IBS (irritable bowel syndrome)   . Migraine     Current Outpatient Medications:  .  albuterol (VENTOLIN HFA) 108 (90 Base) MCG/ACT inhaler, Inhale 2 puffs into the lungs every 6 (six) hours as needed for wheezing or shortness of breath., Disp: 8 g, Rfl: 0 .  cetirizine (ZYRTEC) 10 MG tablet, Take 1 tablet (10 mg total) by mouth daily., Disp: 30 tablet, Rfl: 11 .  ciprofloxacin (CIPRO) 500 MG tablet, Take 1 tablet (500 mg total) by mouth 2 (two) times daily., Disp: 20 tablet, Rfl: 0 .  cyclobenzaprine (FLEXERIL) 10 MG tablet, TAKE 1/2 TO 1 TABLET BY MOUTH TWICE A DAY AS DIRECTED, Disp: 30 tablet, Rfl: 2 .  donepezil (ARICEPT) 10 MG tablet, TAKE 1 TABLET BY MOUTH EVERY DAY, Disp: 90 tablet, Rfl: 1 .  DULoxetine (CYMBALTA) 60 MG capsule, TAKE 1 CAPSULE BY MOUTH EVERY DAY, Disp: 90 capsule, Rfl: 1 .  fluticasone (FLONASE) 50 MCG/ACT nasal spray, SPRAY 2 SPRAYS INTO EACH NOSTRIL EVERY DAY, Disp: 16 g, Rfl: 5 .  furosemide (LASIX) 20 MG tablet, TAKE 1 TABLET BY MOUTH EVERY DAY, Disp: 90 tablet, Rfl: 0 .  gabapentin (NEURONTIN)  300 MG capsule, TAKE 3 CAPSULES BY MOUTH THREE TIMES A DAY, Disp: 810 capsule, Rfl: 1 .  lamoTRIgine (LAMICTAL) 200 MG tablet, TAKE 1 TABLET BY MOUTH EVERY DAY, Disp: 90 tablet, Rfl: 1 .  meclizine (ANTIVERT) 25 MG tablet, TAKE 1 TABLET (25 MG TOTAL) BY MOUTH 3 (THREE) TIMES DAILY AS NEEDED FOR DIZZINESS., Disp: 30 tablet, Rfl: 0 .  nystatin cream (MYCOSTATIN), APPLY TO AFFECTED AREA TWICE A DAY, Disp: 30 g, Rfl: 0 .  pantoprazole (PROTONIX) 40 MG tablet, TAKE 2 TABLETS BY MOUTH EVERY DAY, Disp: 180 tablet, Rfl: 0 .  QUEtiapine (SEROQUEL) 200 MG tablet, TAKE 1 TABLET  BY MOUTH EVERY DAY, Disp: 90 tablet, Rfl: 3 .  QUEtiapine (SEROQUEL) 400 MG tablet, TAKE 2 TABLETS (800 MG TOTAL) BY MOUTH AT BEDTIME., Disp: 180 tablet, Rfl: 2 .  rizatriptan (MAXALT) 10 MG tablet, Take 1 tablet (10 mg total) by mouth as needed. May repeat in 2 hours if needed, Disp: 10 tablet, Rfl: 5 .  Vitamin D, Ergocalciferol, (DRISDOL) 1.25 MG (50000 UT) CAPS capsule, TAKE 1 CAPSULE (50,000 UNITS TOTAL) BY MOUTH 2 (TWO) TIMES A WEEK., Disp: , Rfl: 3  Current Facility-Administered Medications:  .  cyanocobalamin ((VITAMIN Erin-12)) injection 1,000 mcg, 1,000 mcg, Intramuscular, Q30 days, Kingsten Enfield M, DO, 1,000 mcg at 11/03/19 1431  Gen: Moderate right-sided facial swelling that is preauricular.  The mastoid was not well visualized on the video.  She is able to open her mouth about 2-1/2 finger breaths.  No slurred or muffled speech.  Assessment/ Plan: 69 y.o. female   1. Right facial swelling Uncertain etiology.  Concern for possible mastoiditis given recent ear infections.  We discussed possible emergent nature of this.  I discussed her case with the radiologist on-call and he recommended CT of the temporal bone without contrast.  I discussed red flag signs and symptoms with the patient.  She voiced good understanding.  We will try and see if we can get this arranged tomorrow as she did not feel that she can go in this evening for this.  Given the uncertain nature of the swelling, will hold off on oral antibiotics as I think this would be futile in a mastoiditis.  She most certainly would need IV antibiotics.  We discussed this on the video today. - [place]; Future  2. Localized swelling, mass, and lump of head - CT TEMPORAL BONES WO CONTRAST; Future  3. Mass of soft tissue of right upper extremity Uncertain etiology.  This was not well visualized on today's video visit.  I am going to obtain an ultrasound of the right upper extremity.  We discussed the possible  differential diagnosis including cystic formation, lipoma, liposarcoma. - [place] RT UPPER EXTREM LTD SOFT TISSUE NON VASCULAR; Future  Orders Placed This Encounter  Procedures  . CT TEMPORAL BONES WO CONTRAST    Standing Status:   Future    Standing Expiration Date:   06/03/2021    Order Specific Question:   ** REASON FOR EXAM (FREE TEXT)    Answer:   recent ear infection. acute onset of right sided periauricular swelling and pain    Order Specific Question:   GRA to provide read?    Answer:   Yes    Order Specific Question:   Preferred imaging location?    Answer:   [facility]    Order Specific Question:   Radiology Contrast Protocol - do NOT remove file path  Answer:   [email or web address]  . [place] RT UPPER EXTREM LTD SOFT TISSUE NON VASCULAR    Standing Status:   Future    Standing Expiration Date:   05/04/2021    Order Specific Question:   Reason for Exam (SYMPTOM  OR DIAGNOSIS REQUIRED)    Answer:   palpable mass of RUE that is causing pain    Order Specific Question:   Preferred imaging location?    Answer:   Internal  . CBC with Differential    Standing Status:   Future    Standing Expiration Date:   03/04/2021  . [id number]    Standing Status:   Future    Standing Expiration Date:   03/04/2021    Start time: 2:58pm (text sent) End time: 3:10pm  Total time spent on patient care (including video visit/ documentation): 22 minutes  [place], [place] [phone]

## 2020-03-05 ENCOUNTER — Telehealth: Payer: Self-pay | Admitting: Family Medicine

## 2020-03-05 ENCOUNTER — Ambulatory Visit (HOSPITAL_COMMUNITY): Payer: Medicare Other

## 2020-03-05 NOTE — Telephone Encounter (Signed)
Patient is aware of date/time/location of appt for today (03/05/20)

## 2020-03-05 NOTE — Telephone Encounter (Signed)
Patient aware and verbalized understanding. °

## 2020-03-05 NOTE — Telephone Encounter (Signed)
I was waiting on the result of the CT scan.  If she needs abx it will likely need to be IV.  I explained this to her.  See my note.

## 2020-03-06 ENCOUNTER — Ambulatory Visit (HOSPITAL_COMMUNITY): Payer: Medicare Other

## 2020-03-06 ENCOUNTER — Ambulatory Visit (HOSPITAL_COMMUNITY)
Admission: RE | Admit: 2020-03-06 | Discharge: 2020-03-06 | Disposition: A | Discharge status: Home / Self Care | Payer: Medicare Other | Source: Ambulatory Visit | Attending: Family Medicine | Admitting: Family Medicine

## 2020-03-06 ENCOUNTER — Other Ambulatory Visit: Payer: Self-pay

## 2020-03-06 DIAGNOSIS — R220 Localized swelling, mass and lump, head: Secondary | ICD-10-CM | POA: Diagnosis present

## 2020-03-07 ENCOUNTER — Telehealth: Payer: Self-pay | Admitting: Family Medicine

## 2020-03-07 NOTE — Telephone Encounter (Signed)
Patient aware Dr. Reece Agar will be back tomorrow please review.

## 2020-03-07 NOTE — Telephone Encounter (Signed)
Dr. Nadine Counts has not reviewed these results at this time

## 2020-03-08 ENCOUNTER — Other Ambulatory Visit: Payer: Self-pay

## 2020-03-08 ENCOUNTER — Encounter (HOSPITAL_COMMUNITY): Payer: Self-pay

## 2020-03-08 ENCOUNTER — Emergency Department (HOSPITAL_COMMUNITY)
Admission: EM | Admit: 2020-03-08 | Discharge: 2020-03-08 | Disposition: A | Discharge status: Home / Self Care | Payer: Medicare Other | Attending: Emergency Medicine | Admitting: Emergency Medicine

## 2020-03-08 DIAGNOSIS — E119 Type 2 diabetes mellitus without complications: Secondary | ICD-10-CM | POA: Diagnosis not present

## 2020-03-08 DIAGNOSIS — K1121 Acute sialoadenitis: Secondary | ICD-10-CM | POA: Diagnosis not present

## 2020-03-08 DIAGNOSIS — R220 Localized swelling, mass and lump, head: Secondary | ICD-10-CM | POA: Diagnosis present

## 2020-03-08 DIAGNOSIS — Z79899 Other long term (current) drug therapy: Secondary | ICD-10-CM | POA: Diagnosis not present

## 2020-03-08 DIAGNOSIS — I10 Essential (primary) hypertension: Secondary | ICD-10-CM | POA: Insufficient documentation

## 2020-03-08 LAB — CBC WITH DIFFERENTIAL/PLATELET
Abs Immature Granulocytes: 0.01 10*3/uL (ref 0.00–0.07)
Basophils Absolute: 0.1 10*3/uL (ref 0.0–0.1)
Basophils Relative: 1 %
Eosinophils Absolute: 0.2 10*3/uL (ref 0.0–0.5)
Eosinophils Relative: 3 %
HCT: 37 % (ref 36.0–46.0)
Hemoglobin: 11.7 g/dL — ABNORMAL LOW (ref 12.0–15.0)
Immature Granulocytes: 0 %
Lymphocytes Relative: 19 %
Lymphs Abs: 1.1 10*3/uL (ref 0.7–4.0)
MCH: 29.5 pg (ref 26.0–34.0)
MCHC: 31.6 g/dL (ref 30.0–36.0)
MCV: 93.4 fL (ref 80.0–100.0)
Monocytes Absolute: 0.4 10*3/uL (ref 0.1–1.0)
Monocytes Relative: 7 %
Neutro Abs: 4 10*3/uL (ref 1.7–7.7)
Neutrophils Relative %: 70 %
Platelets: 300 10*3/uL (ref 150–400)
RBC: 3.96 MIL/uL (ref 3.87–5.11)
RDW: 13.3 % (ref 11.5–15.5)
WBC: 5.7 10*3/uL (ref 4.0–10.5)
nRBC: 0 % (ref 0.0–0.2)

## 2020-03-08 LAB — BASIC METABOLIC PANEL
Anion gap: 9 (ref 5–15)
BUN: 12 mg/dL (ref 8–23)
CO2: 25 mmol/L (ref 22–32)
Calcium: 8.9 mg/dL (ref 8.9–10.3)
Chloride: 106 mmol/L (ref 98–111)
Creatinine, Ser: 0.92 mg/dL (ref 0.44–1.00)
GFR calc Af Amer: >60 mL/min (ref >60)
GFR calc non Af Amer: >60 mL/min (ref >60)
Glucose, Bld: 108 mg/dL — ABNORMAL HIGH (ref 70–99)
Potassium: 4.4 mmol/L (ref 3.5–5.1)
Sodium: 140 mmol/L (ref 135–145)

## 2020-03-08 MED ORDER — SODIUM CHLORIDE 0.9 % IV BOLUS: 1000.0000 mL | Freq: Once | INTRAVENOUS | Status: DC

## 2020-03-08 MED ORDER — SODIUM CHLORIDE 0.9 % IV SOLN
3.0000 g | Freq: Four times a day (QID) | INTRAVENOUS | Status: DC
  Administered 2020-03-08: 3 g via INTRAVENOUS
  Filled 2020-03-08: qty 8

## 2020-03-08 MED ORDER — AMOXICILLIN-POT CLAVULANATE 875-125 MG PO TABS
1.0000 | ORAL_TABLET | Freq: Two times a day (BID) | ORAL | 0 refills | Status: DC
Start: 2020-03-08 — End: 2020-04-16

## 2020-03-08 NOTE — Telephone Encounter (Signed)
Patient aware and will go to the emergency room

## 2020-03-08 NOTE — Telephone Encounter (Signed)
Please call patient and inform she needs to go to ED for IV antibiotics.  She will need admission for this.  Outpatient antibiotics are NOT recommended.

## 2020-03-08 NOTE — ED Provider Notes (Signed)
[facility] EMERGENCY DEPARTMENT Provider Note   CSN: [id number] Arrival date & time: 03/08/20  1105     History Chief Complaint  Patient presents with  . Facial Swelling    Erin Donaldson is a 69 y.o. female with PMH significant for HLD, HTN, and GERD who was sent to the ED by her PCP, Dr. Valerie Salts, for evaluation.  Patient has been complaining of 5-day history of right-sided facial/periauricular swelling and discomfort.  CT was obtained outpatient of temporal bones without contrast.  I reviewed her medical record and her imaging demonstrates an acute right-sided parotitis.  On my examination, patient is resting comfortably.  She states that she has mild discomfort, but it was her swelling that prompted her to reach out to her primary care provider.  She denies any fevers or chills, headache or dizziness, neck pain or stiffness, rhinorrhea, dental pain, diminished appetite, difficulty eating or swallowing, pain with mastication, ear pain, blurred vision, difficulty breathing, wheezing, or other symptoms.  HPI     Past Medical History:  Diagnosis Date  . Anemia   . Anxiety   . Bipolar affective (HCC)   . Depression   . Diabetes in pregnancy   . Diabetes mellitus   . Elevated LFTs   . GERD (gastroesophageal reflux disease)   . Hyperlipidemia   . Hypertension   . IBS (irritable bowel syndrome)   . Migraine     Patient Active Problem List   Diagnosis Date Noted  . Vitamin D deficiency 10/03/2018  . Overweight (BMI 25.0-29.9) 04/26/2018  . Senile purpura (HCC) 04/04/2018  . Abnormal EKG 12/29/2017  . Iron deficiency anemia 01/24/2016  . Swelling of right lower extremity 07/15/2015  . Right hip pain 07/15/2015  . Microcytic anemia 07/15/2015  . Elevated LFTs   . History of gastric bypass 01/15/2014  . Amnestic disorder due to medical condition (HCC) 08/23/2013  . Diarrhea 07/19/2013  . Bipolar affective disorder in remission (HCC) 08/26/2009  . ANXIETY 08/26/2009  .  Migraine headache 08/26/2009  . Type 2 diabetes mellitus with hyperlipidemia (HCC) 08/15/2009  . Hyperlipidemia 08/15/2009  . Essential hypertension 08/15/2009  . GERD 08/15/2009  . CONSTIPATION 08/15/2009  . IRRITABLE BOWEL SYNDROME 08/15/2009    Past Surgical History:  Procedure Laterality Date  . CHOLECYSTECTOMY    . GASTRIC BYPASS  2005     OB History   No obstetric history on file.     Family History  Problem Relation Age of Onset  . Kidney disease Father   . Heart disease Father 57       CABG  . Diabetes Father   . CAD Father   . Heart disease Mother 71       CAD  . Breast cancer Mother   . Cancer Mother        bladder  . Heart disease Maternal Grandfather   . Heart attack Maternal Grandfather   . Stroke Maternal Grandfather   . Heart disease Brother   . Dementia Brother   . Heart disease Maternal Aunt   . Heart disease Maternal Uncle   . Heart disease Paternal Aunt   . Heart disease Paternal Uncle   . Diabetes Maternal Aunt   . Diabetes Maternal Uncle   . Diabetes Paternal Aunt   . Diabetes Paternal Uncle   . Stroke Maternal Grandmother   . Heart disease Paternal Grandfather   . Diabetes Daughter   . Drug abuse Daughter   . Depression Sister   .  Hypertension Sister   . Alcohol abuse Brother   . Drug abuse Brother   . Other Brother        accident caused brain swelling   . Colon cancer Neg Hx     Social History   Tobacco Use  . Smoking status: Never Smoker  . Smokeless tobacco: Never Used  Substance Use Topics  . Alcohol use: No    Alcohol/week: 0.0 standard drinks    Comment: rare  . Drug use: Yes    Types: Mescaline    Home Medications Prior to Admission medications   Medication Sig Start Date End Date Taking? Authorizing Provider  albuterol (VENTOLIN HFA) 108 (90 Base) MCG/ACT inhaler Inhale 2 puffs into the lungs every 6 (six) hours as needed for wheezing or shortness of breath. 12/04/19   [name] A, FNP   amoxicillin-clavulanate (AUGMENTIN) 875-125 MG tablet Take 1 tablet by mouth every 12 (twelve) hours. 03/08/20   Corena Herter, PA-C  cetirizine (ZYRTEC) 10 MG tablet Take 1 tablet (10 mg total) by mouth daily. 02/12/20   Baruch Gouty, FNP  cyclobenzaprine (FLEXERIL) 10 MG tablet TAKE 1/2 TO 1 TABLET BY MOUTH TWICE A DAY AS DIRECTED 02/05/20   Ronnie Doss M, DO  donepezil (ARICEPT) 10 MG tablet TAKE 1 TABLET BY MOUTH EVERY DAY 11/24/19   Ronnie Doss M, DO  DULoxetine (CYMBALTA) 60 MG capsule TAKE 1 CAPSULE BY MOUTH EVERY DAY 02/13/20   Cottle, Billey Co., MD  fluticasone [facility]) 50 MCG/ACT nasal spray SPRAY 2 SPRAYS INTO EACH NOSTRIL EVERY DAY 02/12/20   Baruch Gouty, FNP  furosemide (LASIX) 20 MG tablet TAKE 1 TABLET BY MOUTH EVERY DAY 02/26/20   Ronnie Doss M, DO  gabapentin (NEURONTIN) 300 MG capsule TAKE 3 CAPSULES BY MOUTH THREE TIMES A DAY 02/02/20   Gottschalk, Leatrice Jewels M, DO  lamoTRIgine (LAMICTAL) 200 MG tablet TAKE 1 TABLET BY MOUTH EVERY DAY 12/18/19   Cottle, Billey Co., MD  meclizine (ANTIVERT) 25 MG tablet TAKE 1 TABLET (25 MG TOTAL) BY MOUTH 3 (THREE) TIMES DAILY AS NEEDED FOR DIZZINESS. 12/29/19   Janora Norlander, DO  nystatin cream (MYCOSTATIN) APPLY TO AFFECTED AREA TWICE A DAY 10/17/19   Gottschalk, Leatrice Jewels M, DO  pantoprazole (PROTONIX) 40 MG tablet TAKE 2 TABLETS BY MOUTH EVERY DAY 11/13/19   Ronnie Doss M, DO  QUEtiapine (SEROQUEL) 200 MG tablet TAKE 1 TABLET BY MOUTH EVERY DAY 02/13/20   Cottle, Billey Co., MD  QUEtiapine (SEROQUEL) 400 MG tablet TAKE 2 TABLETS (800 MG TOTAL) BY MOUTH AT BEDTIME. 08/04/19   Cottle, Billey Co., MD  rizatriptan (MAXALT) 10 MG tablet Take 1 tablet (10 mg total) by mouth as needed. May repeat in 2 hours if needed 11/03/18   Rakes, Connye Burkitt, FNP  Vitamin D, Ergocalciferol, (DRISDOL) 1.25 MG (50000 UT) CAPS capsule TAKE 1 CAPSULE (50,000 UNITS TOTAL) BY MOUTH 2 (TWO) TIMES A WEEK. 08/27/18   [provider]     Allergies    Bactrim [sulfamethoxazole-trimethoprim], Darvocet [propoxyphene n-acetaminophen], Decongestant [oxymetazoline], Elavil [amitriptyline], Lithium, Risperidone and related, and Ultram [tramadol]  Review of Systems   Review of Systems  All other systems reviewed and are negative.   Physical Exam Updated Vital Signs BP (!) 149/77 (BP Location: Right Arm)   Pulse 95   Temp 97.8 F (36.6 C) (Oral)   Resp 15   Ht 5\' 4"  (1.626 m)   Wt 68.5 kg   SpO2 99%  BMI 25.92 kg/m   Physical Exam Vitals and nursing note reviewed. Exam conducted with a chaperone present.  Constitutional:      Appearance: Normal appearance.  HENT:     Head: Normocephalic.     Comments: Mild right-sided facial swelling, with mild associated erythema.  Mild tenderness over parotid gland.  No tragal, pinna, or mastoid TTP.  No mastoid erythema.  No significant rhinorrhea.    Mouth/Throat:     Comments: Patent oropharynx.  Nonerythematous.  No tonsillar hypertrophy or uvular deviation.  No masses appreciated.  No tongue swelling or floor of mouth induration.  Tolerating secretions well.  No trismus. Eyes:     General: No scleral icterus.    Conjunctiva/sclera: Conjunctivae normal.  Neck:     Comments: Demonstrates full ROM.  No sublingual or submental lymphadenopathy.  No neck swelling.  No midline cervical tenderness palpation. Cardiovascular:     Rate and Rhythm: Normal rate and regular rhythm.     Pulses: Normal pulses.     Heart sounds: Normal heart sounds.  Pulmonary:     Effort: Pulmonary effort is normal. No respiratory distress.     Breath sounds: Normal breath sounds. No wheezing.  Skin:    General: Skin is dry.     Capillary Refill: Capillary refill takes less than 2 seconds.  Neurological:     Mental Status: She is alert and oriented to person, place, and time.     GCS: GCS eye subscore is 4. GCS verbal subscore is 5. GCS motor subscore is 6.  Psychiatric:        Mood and Affect: Mood  normal.        Behavior: Behavior normal.        Thought Content: Thought content normal.      ED Results / Procedures / Treatments   Labs (all labs ordered are listed, but only abnormal results are displayed) Labs Reviewed  CBC WITH DIFFERENTIAL/PLATELET - Abnormal; Notable for the following components:      Result Value   Hemoglobin 11.7 (*)    All other components within normal limits  BASIC METABOLIC PANEL - Abnormal; Notable for the following components:   Glucose, Bld 108 (*)    All other components within normal limits    EKG None  Radiology CT TEMPORAL BONES WO CONTRAST  Result Date: 03/06/2020 CLINICAL DATA:  Right-sided facial/periauricular smelling and pain for 1 day. Recent ear infection. Several weeks of ear pressure/pain. EXAM: CT TEMPORAL BONES WITHOUT CONTRAST TECHNIQUE: Axial and coronal plane CT imaging of the petrous temporal bones was performed with thin-collimation image reconstruction. No intravenous contrast was administered. Multiplanar CT image reconstructions were also generated. COMPARISON:  Head CT 05/14/2019 FINDINGS: RIGHT: The external auditory canal is unremarkable. The ossicles appear intact. The tympanic cavity is clear. The internal auditory canal, cochlea, vestibule, and semicircular canals are unremarkable. The mastoid air cells are clear. LEFT: The external auditory canal is unremarkable. The ossicles appear intact. The tympanic cavity is clear. The internal auditory canal, cochlea, vestibule, and semicircular canals are unremarkable. The mastoid air cells are clear. The paranasal sinuses are clear. The right parotid gland is asymmetrically enlarged compared to the left with heterogeneously increased density and mild surrounding inflammatory stranding. No parotid ductal dilatation or stone is evident. The visualized portion of the brain is unremarkable. Visualized orbits are unremarkable. IMPRESSION: 1. Acute right parotitis. 2. Unremarkable appearance  of the temporal bones. Electronically Signed   By: Sebastian Ache M.D.   On:  03/06/2020 19:21    Procedures Procedures (including critical care time)  Medications Ordered in ED Medications  Ampicillin-Sulbactam (UNASYN) 3 g in sodium chloride 0.9 % 100 mL IVPB (0 g Intravenous Stopped 03/08/20 1442)    ED Course  I have reviewed the triage vital signs and the nursing notes.  Pertinent labs & imaging results that were available during my care of the patient were reviewed by me and considered in my medical decision making (see chart for details).  Clinical Course as of Mar 08 1509  Fri Mar 08, 2020  1257 Patient seen by myself as well as PA provider.  Briefly 69 year old female presented to ED with right-sided facial swelling for approximately 5 days.  Patient was seen by her family medicine practitioner ordered a CT scan as an outpatient which was suggestive of a right-sided parotitis with some mild surrounding soft tissue swelling, no obstructing stone.  Following this diagnosis the patient's doctor referred her urgently to the ER for IV antibiotics.  Patient reports to me on exam that she is feeling much better since her swelling even began 5 days ago.  She feels like the swelling is gone down.  She has no significant pain or fevers.  She has no blurred vision.  She is no difficulty with chewing.  She has never had an issue like this before.  Clinically she is extremely well-appearing.  I cannot even see any swelling on her face or erythema.  She does not have any significant tenderness over the parotid gland.  I cannot express any pus.  She is afebrile.  We will plan to check basic labs.  I suspect he may be able to give her a dose of IV Unasyn here in the emergency department and discharged on Augmentin.  I have a low suspicion for worsening infection given that she has had this for 5 days now (without antibiotics), and feels she is improving on her own.  We could consider outpatient treatment with  augmentin   [MT]    Clinical Course User Index [MT] Renaye Rakers Kermit Balo, MD   MDM Rules/Calculators/A&P                      I reviewed CT imaging obtained 03/06/2020 which is consistent with an acute parotitis.  Patient denies any fevers and there is no trismus appreciated on exam.  However, given swelling, rapid onset, and mild erythema, will treat for suppurative parotitis even though this may be viral in etiology.  Her physical exam is entirely benign and there is no oropharyngeal swelling, uvular deviation, respiratory difficulty or wheezing, or other concerning physical exam findings.  Patient is denying any and all other symptoms and clinically she is well-appearing and in no acute distress.  Her laboratory work-up was entirely reassuring and her vital signs have been stable WNL, aside from mildly elevated BP.  We will treat with 3 g IV Unasyn here in the ED and 1 L IV NS, but will discharge home with Augmentin.  Patient states that she feels as though her symptoms have improved over the course of the past few days despite lack of any outpatient antibiotic regimen.  Patient was eating and drinking without difficulty here in the ED.  Dr. Renaye Rakers personally evaluated patient and agrees with assessment and plan.  Encouraged her to follow-up with her primary care provider regarding today's encounter.  Strict ED return precautions discussed.  All of the evaluation and work-up results were discussed with the  patient and any family at bedside. They were provided opportunity to ask any additional questions and have none at this time. They have expressed understanding of verbal discharge instructions as well as return precautions and are agreeable to the plan.    Final Clinical Impression(s) / ED Diagnoses Final diagnoses:  Acute parotitis    Rx / DC Orders ED Discharge Orders         Ordered    amoxicillin-clavulanate (AUGMENTIN) 875-125 MG tablet  Every 12 hours     03/08/20 1427            Lorelee NewGreen, Luqman Perrelli L, PA-C 03/08/20 1510    Terald Sleeperrifan, Matthew J, MD 03/08/20 1901

## 2020-03-08 NOTE — ED Triage Notes (Signed)
Patient was seen by Dr. Valerie Salts for jaw swelling.  Patient was ordered a CT scan of jaw and had been called to come to the ED for possible IV antibiotics.

## 2020-03-08 NOTE — Discharge Instructions (Signed)
Please take your medications, as prescribed Tylenol as needed for symptoms of discomfort.  Please be sure to check your temperature regularly.    I encourage you to follow-up with your primary care provider regarding today's encounter.  Please read the attachment on parotitis.  Return to the ED or seek immediate medical attention should you develop any new or worsening symptoms.

## 2020-03-08 NOTE — Telephone Encounter (Signed)
Left message to please call our office. 

## 2020-03-08 NOTE — Telephone Encounter (Signed)
Pt wants to speak with Dr Reece Agar or nurse regarding CT results.

## 2020-03-15 ENCOUNTER — Telehealth: Payer: Self-pay | Admitting: Family Medicine

## 2020-03-15 NOTE — Telephone Encounter (Signed)
Patient reports she fell just a short while ago, feels extremely shaky, and is having double vision.  Patient is unsure whether the double vision started before or after the fall.  I advised patient that we do no have any appointments available unless we have a cancellation and recommended she go on to the emergency room to be evaluated.

## 2020-03-20 ENCOUNTER — Other Ambulatory Visit: Payer: Self-pay | Admitting: Family Medicine

## 2020-03-20 DIAGNOSIS — M549 Dorsalgia, unspecified: Secondary | ICD-10-CM

## 2020-03-25 ENCOUNTER — Other Ambulatory Visit: Payer: Self-pay | Admitting: Family

## 2020-03-25 DIAGNOSIS — B370 Candidal stomatitis: Secondary | ICD-10-CM

## 2020-04-05 ENCOUNTER — Other Ambulatory Visit: Payer: Self-pay | Admitting: Psychiatry

## 2020-04-16 ENCOUNTER — Encounter: Payer: Self-pay | Admitting: Family

## 2020-04-16 ENCOUNTER — Ambulatory Visit (INDEPENDENT_AMBULATORY_CARE_PROVIDER_SITE_OTHER): Payer: Medicare Other | Admitting: Family

## 2020-04-16 DIAGNOSIS — J019 Acute sinusitis, unspecified: Secondary | ICD-10-CM | POA: Diagnosis not present

## 2020-04-16 MED ORDER — AMOXICILLIN-POT CLAVULANATE 875-125 MG PO TABS: 1.0000 | ORAL_TABLET | Freq: Two times a day (BID) | ORAL | 0 refills | Status: DC

## 2020-04-16 MED ORDER — NYSTATIN 100000 UNIT/GM EX CREA: TOPICAL_CREAM | Freq: Two times a day (BID) | CUTANEOUS | 3 refills | Status: DC

## 2020-04-16 NOTE — Progress Notes (Signed)
   Virtual Visit via telephone Note Due to COVID-19 pandemic this visit was conducted virtually. This visit type was conducted due to national recommendations for restrictions regarding the COVID-19 Pandemic (e.g. social distancing, sheltering in place) in an effort to limit this patient's exposure and mitigate transmission in our community. All issues noted in this document were discussed and addressed.  A physical exam was not performed with this format.  I connected with Erin Donaldson on 04/16/20 at 11:35 AM by telephone and verified that I am speaking with the correct person using two identifiers. Erin Donaldson is currently located at home and no one is currently with her during visit. The provider, Jannifer Rodney, FNP is located in their office at time of visit.   I discussed the limitations, risks, security and privacy concerns of performing an evaluation and management service by telephone and the availability of in person appointments. I also discussed with the patient that there may be a patient responsible charge related to this service. The patient expressed understanding and agreed to proceed.   History and Present Illness:  Sinusitis This is a new problem. The current episode started 1 to 4 weeks ago. The problem has been gradually worsening since onset. There has been no fever. Her pain is at a severity of 8/10. The pain is moderate. Associated symptoms include congestion, coughing, ear pain (right), headaches and sinus pressure. Past treatments include oral decongestants and acetaminophen. The treatment provided mild relief.      Review of Systems  HENT: Positive for congestion, ear pain (right) and sinus pressure.   Respiratory: Positive for cough.   Neurological: Positive for headaches.  All other systems reviewed and are negative.    Observations/Objective: No SOB or distress noted  Assessment and Plan: Erin Donaldson comes in today with chief complaint of No chief  complaint on file.   Diagnosis and orders addressed:  1. Acute sinusitis, recurrence not specified, unspecified location - Take meds as prescribed - Use a cool mist humidifier  -Use saline nose sprays frequently -Force fluids -For any cough or congestion  Use plain Mucinex- regular strength or max strength is fine -For fever or aces or pains- take tylenol or ibuprofen. -Throat lozenges if help -Call if symptoms worsen or do not improve  - amoxicillin-clavulanate (AUGMENTIN) 875-125 MG tablet; Take 1 tablet by mouth 2 (two) times daily.  Dispense: 14 tablet; Refill: 0     I discussed the assessment and treatment plan with the patient. The patient was provided an opportunity to ask questions and all were answered. The patient agreed with the plan and demonstrated an understanding of the instructions.   The patient was advised to call back or seek an in-person evaluation if the symptoms worsen or if the condition fails to improve as anticipated.  The above assessment and management plan was discussed with the patient. The patient verbalized understanding of and has agreed to the management plan. Patient is aware to call the clinic if symptoms persist or worsen. Patient is aware when to return to the clinic for a follow-up visit. Patient educated on when it is appropriate to go to the emergency department.   Time call ended:  11:44 AM  I provided 9 minutes of non-face-to-face time during this encounter.    Jannifer Rodney, FNP

## 2020-04-23 ENCOUNTER — Telehealth: Payer: Self-pay

## 2020-04-23 NOTE — Telephone Encounter (Signed)
Patient states that she had COVID in Jan.  States since then she has been on 3 different antibiotics for different infections.  Would like to know if COVID affects your immune system?  Please review and advise

## 2020-04-24 NOTE — Telephone Encounter (Signed)
It can cause damage to the body.  I wouldn't be surprised if she had chronic cough, chest congestion for months following an infection.  Recommend sinus rinses, antihistamines, rest and good diet to reduce risk of recurrence.

## 2020-04-24 NOTE — Telephone Encounter (Signed)
Aware of provider's suggestions. 

## 2020-05-02 ENCOUNTER — Encounter: Payer: Self-pay | Admitting: Nurse Practitioner

## 2020-05-02 ENCOUNTER — Ambulatory Visit (INDEPENDENT_AMBULATORY_CARE_PROVIDER_SITE_OTHER): Payer: Medicare Other | Admitting: Nurse Practitioner

## 2020-05-02 ENCOUNTER — Other Ambulatory Visit: Payer: Self-pay | Admitting: Family Medicine

## 2020-05-02 DIAGNOSIS — R42 Dizziness and giddiness: Secondary | ICD-10-CM | POA: Diagnosis not present

## 2020-05-02 MED ORDER — PREDNISONE 20 MG PO TABS: ORAL_TABLET | ORAL | 0 refills | Status: DC

## 2020-05-02 NOTE — Progress Notes (Signed)
Virtual Visit via telephone Note Due to COVID-19 pandemic this visit was conducted virtually. This visit type was conducted due to national recommendations for restrictions regarding the COVID-19 Pandemic (e.g. social distancing, sheltering in place) in an effort to limit this patient's exposure and mitigate transmission in our community. All issues noted in this document were discussed and addressed.  A physical exam was not performed with this format.  I connected with Erin Donaldson on 05/02/20 at 10:40 by telephone and verified that I am speaking with the correct person using two identifiers. Erin Donaldson is currently located at home and no one is currently with  her during visit. The provider, Mary-Margaret Daphine Deutscher, FNP is located in their office at time of visit.  I discussed the limitations, risks, security and privacy concerns of performing an evaluation and management service by telephone and the availability of in person appointments. I also discussed with the patient that there may be a patient responsible charge related to this service. The patient expressed understanding and agreed to proceed.   History and Present Illness:   Chief Complaint: Dizziness   HPI Patient calls in stating that she has been dizzy for the last 4 days. Has some double vision during episodes. Slight nausea. She has been taking meclizine which has not really helped.   Review of Systems  Constitutional: Negative for diaphoresis and weight loss.  Eyes: Positive for double vision. Negative for blurred vision and pain.  Respiratory: Negative for shortness of breath.   Cardiovascular: Negative for chest pain, palpitations, orthopnea and leg swelling.  Gastrointestinal: Negative for abdominal pain.  Skin: Negative for rash.  Neurological: Positive for dizziness. Negative for sensory change, loss of consciousness, weakness and headaches.  Endo/Heme/Allergies: Negative for polydipsia. Does not bruise/bleed  easily.  Psychiatric/Behavioral: Negative for memory loss. The patient does not have insomnia.   All other systems reviewed and are negative.    Observations/Objective: Alert and oriented- answers all questions appropriately No distress    Assessment and Plan: Erin Donaldson in today with chief complaint of Dizziness   1. Vertigo Force fluids Rise slowly from sitting to standing Continue meclizine Meds ordered this encounter  Medications  . predniSONE (DELTASONE) 20 MG tablet    Sig: 2 po at sametime daily for 5 days    Dispense:  10 tablet    Refill:  0    Order Specific Question:   Supervising Provider    Answer:   Arville Care A [[id number]]       Follow Up Instructions: Needs to be seen if no better in 48-72 hours    I discussed the assessment and treatment plan with the patient. The patient was provided an opportunity to ask questions and all were answered. The patient agreed with the plan and demonstrated an understanding of the instructions.   The patient was advised to call back or seek an in-person evaluation if the symptoms worsen or if the condition fails to improve as anticipated.  The above assessment and management plan was discussed with the patient. The patient verbalized understanding of and has agreed to the management plan. Patient is aware to call the clinic if symptoms persist or worsen. Patient is aware when to return to the clinic for a follow-up visit. Patient educated on when it is appropriate to go to the emergency department.   Time call ended:  11:00  I provided 15 minutes of non-face-to-face time during this encounter.    Mary-Margaret Daphine Deutscher, FNP

## 2020-05-06 ENCOUNTER — Ambulatory Visit: Payer: Medicare Other | Admitting: Family Medicine

## 2020-05-13 ENCOUNTER — Encounter: Payer: Self-pay | Admitting: Nurse Practitioner

## 2020-05-13 ENCOUNTER — Ambulatory Visit (INDEPENDENT_AMBULATORY_CARE_PROVIDER_SITE_OTHER): Payer: Medicare Other | Admitting: Nurse Practitioner

## 2020-05-13 ENCOUNTER — Other Ambulatory Visit: Payer: Self-pay

## 2020-05-13 VITALS — BP 113/74 | HR 93 | Temp 97.9°F | Resp 20 | Ht 64.0 in | Wt 165.0 lb

## 2020-05-13 DIAGNOSIS — L03113 Cellulitis of right upper limb: Secondary | ICD-10-CM

## 2020-05-13 MED ORDER — CEPHALEXIN 500 MG PO CAPS: 500.0000 mg | ORAL_CAPSULE | Freq: Three times a day (TID) | ORAL | 0 refills | Status: DC

## 2020-05-13 NOTE — Patient Instructions (Signed)
Wound Infection °A wound infection happens when tiny organisms (microorganisms) start to grow in a wound. A wound infection is most often caused by bacteria. Infection can cause the wound to break open or worsen. Wound infection needs treatment. If a wound infection is left untreated, complications can occur. Untreated wound infections may lead to an infection in the bloodstream (septicemia) or a bone infection (osteomyelitis). °What are the causes? °This condition is most often caused by bacteria growing in a wound. Other microorganisms, like yeast and fungi, can also cause wound infections. °What increases the risk? °The following factors may make you more likely to develop this condition: °· Having a weak body defense system (immune system). °· Having diabetes. °· Taking steroid medicines for a long time (chronic use). °· Smoking. °· Being an older person. °· Being overweight. °· Taking chemotherapy medicines. °What are the signs or symptoms? °Symptoms of this condition include: °· Having more redness, swelling, or pain at the wound site. °· Having more blood or fluid at the wound site. °· A bad smell coming from a wound or bandage (dressing). °· Having a fever. °· Feeling tired or fatigued. °· Having warmth at or around the wound. °· Having pus at the wound site. °How is this diagnosed? °This condition is diagnosed with a medical history and physical exam. You may also have a wound culture or blood tests or both. °How is this treated? °This condition is usually treated with an antibiotic medicine. °· The infection should improve 24-48 hours after you start antibiotics. °· After 24-48 hours, redness around the wound should stop spreading, and the wound should be less painful. °Follow these instructions at home: °Medicines °· Take or apply over-the-counter and prescription medicines only as told by your health care provider. °· If you were prescribed an antibiotic medicine, take or apply it as told by your health  care provider. Do not stop using the antibiotic even if you start to feel better. °Wound care ° °· Clean the wound each day, or as told by your health care provider. °? Wash the wound with mild soap and water. °? Rinse the wound with water to remove all soap. °? Pat the wound dry with a clean towel. Do not rub it. °· Follow instructions from your health care provider about how to take care of your wound. Make sure you: °? Wash your hands with soap and water before and after you change your dressing. If soap and water are not available, use hand sanitizer. °? Change your dressing as told by your health care provider. °? Leave stitches (sutures), skin glue, or adhesive strips in place if your wound has been closed. These skin closures may need to stay in place for 2 weeks or longer. If adhesive strip edges start to loosen and curl up, you may trim the loose edges. Do not remove adhesive strips completely unless your health care provider tells you to do that. Some wounds are left open to heal on their own. °· Check your wound every day for signs of infection. Watch for: °? More redness, swelling, or pain. °? More fluid or blood. °? Warmth. °? Pus or a bad smell. °General instructions °· Keep the dressing dry until your health care provider says it can be removed. °· Do not take baths, swim, or use a hot tub until your health care provider approves. Ask your health care provider if you may take showers. You may only be allowed to take sponge baths. °· Raise (elevate)   the injured area above the level of your heart while you are sitting or lying down. °· Do not scratch or pick at the wound. °· Keep all follow-up visits as told by your health care provider. This is important. °Contact a health care provider if: °· Your pain is not controlled with medicine. °· You have more redness, swelling, or pain around your wound. °· You have more fluid or blood coming from your wound. °· Your wound feels warm to the touch. °· You have  pus coming from your wound. °· You continue to notice a bad smell coming from your wound or your dressing. °· Your wound that was closed breaks open. °Get help right away if: °· You have a red streak going away from your wound. °· You have a fever. °Summary °· A wound infection happens when tiny organisms (microorganisms) start to grow in a wound. °· This condition is usually treated with an antibiotic medicine. °· Follow instructions from your health care provider about how to take care of your wound. °· Contact a health care provider if your wound infection does not begin to improve in 24-48 hours, or your symptoms worsen. °· Keep all follow-up visits as told by your health care provider. This is important. °This information is not intended to replace advice given to you by your health care provider. Make sure you discuss any questions you have with your health care provider. °Document Revised: 06/14/2018 Document Reviewed: 06/14/2018 °Elsevier Patient Education © 2020 [employer] ° °

## 2020-05-13 NOTE — Progress Notes (Signed)
   Subjective:    Patient ID: Erin Donaldson, female    DOB: 03/13/1951, 69 y.o.   MRN: [id number]   Chief Complaint: Check place on right arm   HPI Patient comes today with scrap on right forearm that she did about a month ago. All the sudden the area started draing and has turned red.   Review of Systems  Constitutional: Negative for diaphoresis.  Eyes: Negative for pain.  Respiratory: Negative for shortness of breath.   Cardiovascular: Negative for chest pain, palpitations and leg swelling.  Gastrointestinal: Negative for abdominal pain.  Endocrine: Negative for polydipsia.  Skin: Negative for rash.  Neurological: Negative for dizziness, weakness and headaches.  Hematological: Does not bruise/bleed easily.  All other systems reviewed and are negative.      Objective:   Physical Exam Vitals and nursing note reviewed.  Constitutional:      Appearance: Normal appearance.  Skin:    General: Skin is warm.     Comments: Open wound right forearm.  Neurological:     General: No focal deficit present.     Mental Status: She is alert and oriented to person, place, and time.        BP 113/74   Pulse 93   Temp 97.9 F (36.6 C) (Temporal)   Resp 20   Ht 5\' 4"  (1.626 m)   Wt 165 lb (74.8 kg)   SpO2 97%   BMI 28.32 kg/m   Wound ckeaned with saline- antibiotic ointment and dressing applied      Assessment & Plan:  in today with chief complaint of Check place on right arm   1. Cellulitis of right forearm Clean with antibacterial soap BID Keep clean and dry If not improving by Thursday- call and will refer to wound care. - cephALEXin (KEFLEX) 500 MG capsule; Take 1 capsule (500 mg total) by mouth 3 (three) times daily.  Dispense: 30 capsule; Refill: 0 - Anaerobic and Aerobic Culture    The above assessment and management plan was discussed with the patient. The patient verbalized understanding of and has agreed to the management plan. Patient is  aware to call the clinic if symptoms persist or worsen. Patient is aware when to return to the clinic for a follow-up visit. Patient educated on when it is appropriate to go to the emergency department.   Mary-Margaret Thursday, FNP

## 2020-05-17 LAB — ANAEROBIC AND AEROBIC CULTURE

## 2020-05-21 ENCOUNTER — Other Ambulatory Visit: Payer: Self-pay | Admitting: Family Medicine

## 2020-05-21 ENCOUNTER — Telehealth: Payer: Self-pay | Admitting: Family Medicine

## 2020-05-21 MED ORDER — DOXYCYCLINE HYCLATE 100 MG PO TABS: 100.0000 mg | ORAL_TABLET | Freq: Two times a day (BID) | ORAL | 0 refills | Status: DC

## 2020-05-21 NOTE — Telephone Encounter (Signed)
I will send replacement but PLEASE make sure that you are sending to [facility] provider in the future.  Will cc MMM of the change.

## 2020-05-22 NOTE — Telephone Encounter (Signed)
Patient aware, per message left on voice mail,   script is ready. 

## 2020-05-27 ENCOUNTER — Other Ambulatory Visit: Payer: Self-pay | Admitting: Family Medicine

## 2020-06-06 ENCOUNTER — Encounter: Payer: Self-pay | Admitting: Family

## 2020-06-06 ENCOUNTER — Ambulatory Visit (INDEPENDENT_AMBULATORY_CARE_PROVIDER_SITE_OTHER): Payer: Medicare Other | Admitting: Family

## 2020-06-06 DIAGNOSIS — L089 Local infection of the skin and subcutaneous tissue, unspecified: Secondary | ICD-10-CM | POA: Diagnosis not present

## 2020-06-06 DIAGNOSIS — R296 Repeated falls: Secondary | ICD-10-CM

## 2020-06-06 DIAGNOSIS — S40811A Abrasion of right upper arm, initial encounter: Secondary | ICD-10-CM

## 2020-06-06 MED ORDER — DOXYCYCLINE HYCLATE 100 MG PO TABS: 100.0000 mg | ORAL_TABLET | Freq: Two times a day (BID) | ORAL | 0 refills | Status: DC

## 2020-06-06 NOTE — Progress Notes (Signed)
   Virtual Visit via telephone Note Due to COVID-19 pandemic this visit was conducted virtually. This visit type was conducted due to national recommendations for restrictions regarding the COVID-19 Pandemic (e.g. social distancing, sheltering in place) in an effort to limit this patient's exposure and mitigate transmission in our community. All issues noted in this document were discussed and addressed.  A physical exam was not performed with this format.  I connected with Erin Donaldson on 06/06/20 at 1:14 pm by telephone and verified that I am speaking with the correct person using two identifiers. Erin Donaldson is currently located at home and son is currently with her during visit. The provider, Jannifer Rodney, FNP is located in their office at time of visit.  I discussed the limitations, risks, security and privacy concerns of performing an evaluation and management service by telephone and the availability of in person appointments. I also discussed with the patient that there may be a patient responsible charge related to this service. The patient expressed understanding and agreed to proceed.   History and Present Illness:  HPI  PT calls the office today with a abrasion on right arm from her elbow to half down her arm. She states she fell on concrete two days ago. She reports she had a staph infection in her right arm after another fall that occurred about a month ago. She completed doxycycline and states her arm was healed, until she just fell two days ago .   She reports she is having a erythemas/brown discharge. Denies warmth, fever. She has constant burning pain of 8 out 10.    Review of Systems  Musculoskeletal: Positive for falls.  Skin:       Wound   All other systems reviewed and are negative.    Observations/Objective: No SOB or distress noted   Assessment and Plan: 1. Infected abrasion of right upper arm, initial encounter - doxycycline (VIBRA-TABS) 100 MG tablet;  Take 1 tablet (100 mg total) by mouth 2 (two) times daily.  Dispense: 20 tablet; Refill: 0  2. Frequent falls - doxycycline (VIBRA-TABS) 100 MG tablet; Take 1 tablet (100 mg total) by mouth 2 (two) times daily.  Dispense: 20 tablet; Refill: 0  Will restart Doxycycline today Will follow up with PCP about frequent falls next week Fall precautions discussed Keep wound clean and dry    I discussed the assessment and treatment plan with the patient. The patient was provided an opportunity to ask questions and all were answered. The patient agreed with the plan and demonstrated an understanding of the instructions.   The patient was advised to call back or seek an in-person evaluation if the symptoms worsen or if the condition fails to improve as anticipated.  The above assessment and management plan was discussed with the patient. The patient verbalized understanding of and has agreed to the management plan. Patient is aware to call the clinic if symptoms persist or worsen. Patient is aware when to return to the clinic for a follow-up visit. Patient educated on when it is appropriate to go to the emergency department.   Time call ended:  1:30 pm   I provided 16 minutes of non-face-to-face time during this encounter.    Jannifer Rodney, FNP

## 2020-06-14 ENCOUNTER — Ambulatory Visit (INDEPENDENT_AMBULATORY_CARE_PROVIDER_SITE_OTHER): Payer: Medicare Other | Admitting: Family Medicine

## 2020-06-14 ENCOUNTER — Encounter: Payer: Self-pay | Admitting: Family Medicine

## 2020-06-14 ENCOUNTER — Other Ambulatory Visit: Payer: Self-pay

## 2020-06-14 VITALS — BP 96/65 | HR 120 | Temp 97.3°F | Ht 64.0 in | Wt 153.8 lb

## 2020-06-14 DIAGNOSIS — E569 Vitamin deficiency, unspecified: Secondary | ICD-10-CM | POA: Diagnosis not present

## 2020-06-14 DIAGNOSIS — E119 Type 2 diabetes mellitus without complications: Secondary | ICD-10-CM

## 2020-06-14 DIAGNOSIS — M62838 Other muscle spasm: Secondary | ICD-10-CM

## 2020-06-14 DIAGNOSIS — Z9884 Bariatric surgery status: Secondary | ICD-10-CM

## 2020-06-14 DIAGNOSIS — E785 Hyperlipidemia, unspecified: Secondary | ICD-10-CM

## 2020-06-14 DIAGNOSIS — E1169 Type 2 diabetes mellitus with other specified complication: Secondary | ICD-10-CM | POA: Diagnosis not present

## 2020-06-14 LAB — BAYER DCA HB A1C WAIVED: HB A1C (BAYER DCA - WAIVED): 6.8 % (ref <7.0)

## 2020-06-14 MED ORDER — CYCLOBENZAPRINE HCL 10 MG PO TABS: 5.0000 mg | ORAL_TABLET | Freq: Two times a day (BID) | ORAL | 2 refills | Status: DC | PRN

## 2020-06-14 NOTE — Patient Instructions (Addendum)
You had labs performed today.  You will be contacted with the results of the labs once they are available, usually in the next 3 business days for routine lab work.  If you have an active my chart account, they will be released to your MyChart.  If you prefer to have these labs released to you via telephone, please let us know.  If you had a pap smear or biopsy performed, expect to be contacted in about 7-10 days.  Weight was

## 2020-06-14 NOTE — Progress Notes (Signed)
Subjective: CC: f/u T2DM PCP: Janora Norlander, DO HLK:TGYB B Bollig is a 69 y.o. female presenting to clinic today for:  1. Type 2 Diabetes w/ HLD; h/o gastric bypass:  History: Onset type 2 diabetes prior to her gastric bypass, which was performed over 16 years ago.  She has history of Roux-en-Y.  No history of foot ulcer or retinopathy  She is diet controlled.  Denies any history of hospitalization for uncontrolled blood sugars.  She is currently treated with vitamin D, vitamin B12 injection monthly and Feraheme infusions.  Next appointment with Dr. Marin Olp not scheduled.    Last eye exam: She had done this year Last foot exam: needs Last A1c:  Lab Results  Component Value Date   HGBA1C 6.1 11/03/2019   Nephropathy screen indicated?: DUE Last flu, zoster and/or pneumovax:  Immunization History  Administered Date(s) Administered  . Fluad Quad(high Dose 65+) 09/11/2019  . Influenza, High Dose Seasonal PF 08/24/2018  . Influenza,inj,Quad PF,6+ Mos 10/02/2016, 10/18/2017  . Moderna SARS-COVID-2 Vaccination 04/01/2020  . Pneumococcal Conjugate-13 11/03/2019  . Pneumococcal Polysaccharide-23 06/04/2010  . Zoster 06/04/2010    ROS: No shortness of breath.  She does have a slight chest pain but this is due to contusion.  She unfortunately fell over and hit the anterior chest and has subsequently been having some spasms in the muscles of the chest.  She is asking for a refill on her Flexeril for this  ROS: Per HPI  Allergies  Allergen Reactions  . Bactrim [Sulfamethoxazole-Trimethoprim]     Raw and irritatated in mouth.  Carlton Adam [Propoxyphene N-Acetaminophen]   . Decongestant [Oxymetazoline]   . Elavil [Amitriptyline]   . Lithium Other (See Comments)    Hx toxicity  . Risperidone And Related   . Ultram [Tramadol]   . Keflex [Cephalexin] Rash   Past Medical History:  Diagnosis Date  . Anemia   . Anxiety   . Bipolar affective ([place])   . Depression   . Diabetes  in pregnancy   . Diabetes mellitus   . Elevated LFTs   . GERD (gastroesophageal reflux disease)   . Hyperlipidemia   . Hypertension   . IBS (irritable bowel syndrome)   . Migraine     Current Outpatient Medications:  .  albuterol (VENTOLIN HFA) 108 (90 Base) MCG/ACT inhaler, Inhale 2 puffs into the lungs every 6 (six) hours as needed for wheezing or shortness of breath., Disp: 8 g, Rfl: 0 .  cetirizine (ZYRTEC) 10 MG tablet, Take 1 tablet (10 mg total) by mouth daily., Disp: 30 tablet, Rfl: 11 .  cyclobenzaprine (FLEXERIL) 10 MG tablet, Take 0.5-1 tablets (5-10 mg total) by mouth 2 (two) times daily as needed for muscle spasms., Disp: 60 tablet, Rfl: 2 .  donepezil (ARICEPT) 10 MG tablet, TAKE 1 TABLET BY MOUTH EVERY DAY, Disp: 90 tablet, Rfl: 0 .  doxycycline (VIBRA-TABS) 100 MG tablet, Take 1 tablet (100 mg total) by mouth 2 (two) times daily., Disp: 20 tablet, Rfl: 0 .  DULoxetine (CYMBALTA) 60 MG capsule, TAKE 1 CAPSULE BY MOUTH EVERY DAY, Disp: 90 capsule, Rfl: 1 .  fluticasone (FLONASE) 50 MCG/ACT nasal spray, SPRAY 2 SPRAYS INTO EACH NOSTRIL EVERY DAY, Disp: 16 g, Rfl: 5 .  furosemide (LASIX) 20 MG tablet, TAKE 1 TABLET BY MOUTH EVERY DAY, Disp: 90 tablet, Rfl: 0 .  gabapentin (NEURONTIN) 300 MG capsule, TAKE 3 CAPSULES BY MOUTH THREE TIMES A DAY, Disp: 810 capsule, Rfl: 1 .  lamoTRIgine (LAMICTAL)  200 MG tablet, TAKE 1 TABLET BY MOUTH EVERY DAY, Disp: 90 tablet, Rfl: 1 .  meclizine (ANTIVERT) 25 MG tablet, TAKE 1 TABLET (25 MG TOTAL) BY MOUTH 3 (THREE) TIMES DAILY AS NEEDED FOR DIZZINESS., Disp: 30 tablet, Rfl: 0 .  nystatin cream (MYCOSTATIN), Apply topically 2 (two) times daily., Disp: 60 g, Rfl: 3 .  pantoprazole (PROTONIX) 40 MG tablet, TAKE 2 TABLETS BY MOUTH EVERY DAY, Disp: 180 tablet, Rfl: 0 .  QUEtiapine (SEROQUEL) 200 MG tablet, TAKE 1 TABLET BY MOUTH EVERY DAY, Disp: 90 tablet, Rfl: 3 .  QUEtiapine (SEROQUEL) 400 MG tablet, TAKE 2 TABLETS (800 MG TOTAL) BY MOUTH AT  BEDTIME., Disp: 180 tablet, Rfl: 2 .  rizatriptan (MAXALT) 10 MG tablet, Take 1 tablet (10 mg total) by mouth as needed. May repeat in 2 hours if needed, Disp: 10 tablet, Rfl: 5 .  Vitamin D, Ergocalciferol, (DRISDOL) 1.25 MG (50000 UT) CAPS capsule, TAKE 1 CAPSULE (50,000 UNITS TOTAL) BY MOUTH 2 (TWO) TIMES A WEEK., Disp: , Rfl: 3  Current Facility-Administered Medications:  .  cyanocobalamin ((VITAMIN B-12)) injection 1,000 mcg, 1,000 mcg, Intramuscular, Q30 days, Lawren Sexson M, DO, 1,000 mcg at 11/03/19 1431 Social History   Socioeconomic History  . Marital status: Widowed    Spouse name: Not on file  . Number of children: 1  . Years of education: college  . Highest education level: Not on file  Occupational History  . Occupation: disabled    Comment: [employer]   Tobacco Use  . Smoking status: Never Smoker  . Smokeless tobacco: Never Used  Vaping Use  . Vaping Use: Never used  Substance and Sexual Activity  . Alcohol use: No    Alcohol/week: 0.0 standard drinks    Comment: rare  . Drug use: Yes    Types: [place]  . Sexual activity: Not Currently  Other Topics Concern  . Not on file  Social History Narrative   Ms. Kravitz is a widow, her husband passed away in his sleep in Feb 11, 2012.   She currently has custody of her great grandson, who is 64 years old.  She actually delivered her great-grandson at home.   Social Determinants of Health   Financial Resource Strain: Low Risk   . Difficulty of Paying Living Expenses: Not hard at all  Food Insecurity: No Food Insecurity  . Worried About Charity fundraiser in the Last Year: Never true  . Ran Out of Food in the Last Year: Never true  Transportation Needs: No Transportation Needs  . Lack of Transportation (Medical): No  . Lack of Transportation (Non-Medical): No  Physical Activity: Inactive  . Days of Exercise per Week: 0 days  . Minutes of Exercise per Session: 0 min  Stress: Stress Concern Present  . Feeling of Stress :  Very much  Social Connections: Unknown  . Frequency of Communication with Friends and Family: More than three times a week  . Frequency of Social Gatherings with Friends and Family: Once a week  . Attends Religious Services: Patient refused  . Active Member of Clubs or Organizations: No  . Attends Archivist Meetings: Never  . Marital Status: Widowed  Intimate Partner Violence: Not At Risk  . Fear of Current or Ex-Partner: No  . Emotionally Abused: No  . Physically Abused: No  . Sexually Abused: No   Family History  Problem Relation Age of Onset  . Kidney disease Father   . Heart disease Father 14  CABG  . Diabetes Father   . CAD Father   . Heart disease Mother 75       CAD  . Breast cancer Mother   . Cancer Mother        bladder  . Heart disease Maternal Grandfather   . Heart attack Maternal Grandfather   . Stroke Maternal Grandfather   . Heart disease Brother   . Dementia Brother   . Heart disease Maternal Aunt   . Heart disease Maternal Uncle   . Heart disease Paternal Aunt   . Heart disease Paternal Uncle   . Diabetes Maternal Aunt   . Diabetes Maternal Uncle   . Diabetes Paternal Aunt   . Diabetes Paternal Uncle   . Stroke Maternal Grandmother   . Heart disease Paternal Grandfather   . Diabetes Daughter   . Drug abuse Daughter   . Depression Sister   . Hypertension Sister   . Alcohol abuse Brother   . Drug abuse Brother   . Other Brother        accident caused brain swelling   . Colon cancer Neg Hx     Objective: Office vital signs reviewed. BP 96/65   Pulse (!) 120   Temp (!) 97.3 F (36.3 C)   Ht '5\' 4"'$  (1.626 m)   Wt 153 lb 12.8 oz (69.8 kg)   SpO2 100%   BMI 26.40 kg/m   Physical Examination:  General: Awake, alert, well nourished. No acute distress HEENT: Normal, sclera white, MMM Cardio: regular rate and rhythm, S1S2 heard, no murmurs appreciated Pulm: clear to auscultation bilaterally, no wheezes, rhonchi or rales; normal  work of breathing on room air Extremities: warm, well perfused, No edema, cyanosis or clubbing; +2 pulses bilaterally. Skin: Large area of ecchymosis along the chest with multiple contusions along bilateral forearms Psych: Mood stable, speech normal, affect appropriate  Depression screen [facility] 2/9 06/14/2020 05/13/2020 11/03/2019  Decreased Interest 0 0 3  Down, Depressed, Hopeless 0 0 3  PHQ - 2 Score 0 0 6  Altered sleeping - - 0  Tired, decreased energy - - 1  Change in appetite - - 0  Feeling bad or failure about yourself  - - 1  Trouble concentrating - - 0  Moving slowly or fidgety/restless - - 0  Suicidal thoughts - - -  PHQ-9 Score - - 8  Difficult doing work/chores - - Somewhat difficult  Some recent data might be hidden   Assessment/ Plan: 69 y.o. female   1. Diet-controlled diabetes mellitus ([place]) Continues to have diet-controlled type 2 diabetes with A1c at 6.8 today.  Urine microalbumin collected - Bayer DCA Hb A1c Waived - Microalbumin / creatinine urine ratio - CMP14+EGFR  2. Hyperlipidemia associated with type 2 diabetes mellitus ([place]) - CMP14+EGFR  3. History of Roux-en-Y gastric bypass Check for vitamin deficiency - CBC with Differential - Vitamin B12 - VITAMIN D 25 Hydroxy (Vit-D Deficiency, Fractures)  4. Vitamin deficiency - CBC with Differential - CMP14+EGFR - Vitamin B12 - VITAMIN D 25 Hydroxy (Vit-D Deficiency, Fractures)  5.  Muscle spasm In the setting of recent fall.  Caution sedation. - cyclobenzaprine (FLEXERIL) 10 MG tablet; Take 0.5-1 tablets (5-10 mg total) by mouth 2 (two) times daily as needed for muscle spasms.  Dispense: 60 tablet; Refill: 2   Orders Placed This Encounter  Procedures  . Bayer DCA Hb A1c Waived  . Microalbumin / creatinine urine ratio   No orders of the defined types were placed  in this encounter.    Janora Norlander, DO [place] [phone]

## 2020-06-15 LAB — CMP14+EGFR
ALT: 29 IU/L (ref 0–32)
AST: 42 IU/L — ABNORMAL HIGH (ref 0–40)
Albumin/Globulin Ratio: 1.4 (ref 1.2–2.2)
Albumin: 3.5 g/dL — ABNORMAL LOW (ref 3.8–4.8)
Alkaline Phosphatase: 271 IU/L — ABNORMAL HIGH (ref 48–121)
BUN/Creatinine Ratio: 17 (ref 12–28)
BUN: 26 mg/dL (ref 8–27)
Bilirubin Total: 0.3 mg/dL (ref 0.0–1.2)
CO2: 21 mmol/L (ref 20–29)
Calcium: 9.3 mg/dL (ref 8.7–10.3)
Chloride: 98 mmol/L (ref 96–106)
Creatinine, Ser: 1.57 mg/dL — ABNORMAL HIGH (ref 0.57–1.00)
GFR calc Af Amer: 39 mL/min/{1.73_m2} — ABNORMAL LOW (ref >59)
GFR calc non Af Amer: 34 mL/min/{1.73_m2} — ABNORMAL LOW (ref >59)
Globulin, Total: 2.5 g/dL (ref 1.5–4.5)
Glucose: 213 mg/dL — ABNORMAL HIGH (ref 65–99)
Potassium: 3.1 mmol/L — ABNORMAL LOW (ref 3.5–5.2)
Sodium: 137 mmol/L (ref 134–144)
Total Protein: 6 g/dL (ref 6.0–8.5)

## 2020-06-15 LAB — CBC WITH DIFFERENTIAL/PLATELET
Basophils Absolute: 0.1 10*3/uL (ref 0.0–0.2)
Basos: 1 %
EOS (ABSOLUTE): 0.1 10*3/uL (ref 0.0–0.4)
Eos: 0 %
Hematocrit: 41.6 % (ref 34.0–46.6)
Hemoglobin: 14.2 g/dL (ref 11.1–15.9)
Immature Grans (Abs): 0.1 10*3/uL (ref 0.0–0.1)
Immature Granulocytes: 1 %
Lymphocytes Absolute: 1.6 10*3/uL (ref 0.7–3.1)
Lymphs: 10 %
MCH: 30.3 pg (ref 26.6–33.0)
MCHC: 34.1 g/dL (ref 31.5–35.7)
MCV: 89 fL (ref 79–97)
Monocytes Absolute: 1.1 10*3/uL — ABNORMAL HIGH (ref 0.1–0.9)
Monocytes: 7 %
Neutrophils Absolute: 12.5 10*3/uL — ABNORMAL HIGH (ref 1.4–7.0)
Neutrophils: 81 %
Platelets: 396 10*3/uL (ref 150–450)
RBC: 4.69 x10E6/uL (ref 3.77–5.28)
RDW: 12.9 % (ref 11.7–15.4)
WBC: 15.4 10*3/uL — ABNORMAL HIGH (ref 3.4–10.8)

## 2020-06-15 LAB — VITAMIN B12: Vitamin B-12: 1655 pg/mL — ABNORMAL HIGH (ref 232–1245)

## 2020-06-15 LAB — VITAMIN D 25 HYDROXY (VIT D DEFICIENCY, FRACTURES): Vit D, 25-Hydroxy: 28.7 ng/mL — ABNORMAL LOW (ref 30.0–100.0)

## 2020-06-17 ENCOUNTER — Telehealth: Payer: Self-pay | Admitting: Family Medicine

## 2020-06-17 ENCOUNTER — Other Ambulatory Visit (INDEPENDENT_AMBULATORY_CARE_PROVIDER_SITE_OTHER): Payer: Medicare Other | Admitting: Family Medicine

## 2020-06-17 DIAGNOSIS — N12 Tubulo-interstitial nephritis, not specified as acute or chronic: Secondary | ICD-10-CM

## 2020-06-17 DIAGNOSIS — N179 Acute kidney failure, unspecified: Secondary | ICD-10-CM

## 2020-06-17 MED ORDER — CIPROFLOXACIN HCL 500 MG PO TABS: 500.0000 mg | ORAL_TABLET | Freq: Two times a day (BID) | ORAL | 0 refills | Status: DC

## 2020-06-17 NOTE — Telephone Encounter (Signed)
Patient aware and verbalizes understanding.  States she will go to the ER.

## 2020-06-17 NOTE — Telephone Encounter (Signed)
Patient had a visit with Dr. Reece Agar on 06/14/20 and her BP was 96/65.  States since then her BP has still been running high and she has been feeling light headed.  Below are BP readings.  06/16/20- 93/57 98/72 102/59 93/56  06/17/20- 97/69  Please review and advise

## 2020-06-17 NOTE — Progress Notes (Signed)
Patient: home Provider: [facility]  I contacted the patient this evening to review her most recent labs.  She has an acute kidney injury, hypokalemia and leukocytosis.  After further questioning it appears that she has been having dysuria for several days but did not report this to me during her visit.  She does not report any fevers, nausea or vomiting.  Her blood pressure has remained low.  When we saw her in the office she had a lowered blood pressure and a mildly elevated heart rate, which we thought at that point was due to inadequate consumption of fluids.  However, given the new findings I am quite worried about a sepsis picture in this patient.   1. Pyelonephritis  2. AKI (acute kidney injury) (HCC)  I reiterated that I really want her go to the emergency department for evaluation, IV fluids and IV antibiotics but she was very resistant to this because she has a disabled spouse that requires her assistance.  She has nobody to watch them.  At minimum, I am asking that she at least start an oral antibiotic though I did reiterate that this is not the preferred treatment.  She voiced good understanding but wishes to proceed with outpatient treatment and she will try and get to the emergency department tomorrow for evaluation.  She understands the risks of not seeking treatment at this time.Given her multiple drug allergies, Cipro was dose adjusted for current renal function.  She most certainly is meeting sepsis criteria.  Meds ordered this encounter  Medications  . ciprofloxacin (CIPRO) 500 MG tablet    Sig: Take 1 tablet (500 mg total) by mouth 2 (two) times daily for 7 days.    Dispense:  14 tablet    Refill:  0   Start: 5:42pm End: 5:50pm

## 2020-06-17 NOTE — Telephone Encounter (Signed)
Given the fact that her blood pressure continues to remain low and she is now symptomatic with feeling lightheaded, she may need to consider evaluation in the emergency department for IV fluids.  I worry about her being dehydrated in the setting of history of Roux-en-Y gastric bypass.  I am a certainly think that she would also benefit from seeing the cardiologist again.  I think she was seeing Dr. Antoine Poche previously.  She may have to be placed on medication to raise her blood pressure.  Have her increase salt intake.  This may help as well

## 2020-06-17 NOTE — Telephone Encounter (Signed)
Pt was here last Friday--she is asking how can she get bp to go up because she bp machine is reading below 100. Please call back.

## 2020-06-20 ENCOUNTER — Other Ambulatory Visit: Payer: Self-pay

## 2020-06-20 ENCOUNTER — Other Ambulatory Visit: Payer: Self-pay | Admitting: Psychiatry

## 2020-06-20 ENCOUNTER — Observation Stay (HOSPITAL_COMMUNITY)
Admission: EM | Admit: 2020-06-20 | Discharge: 2020-06-21 | Disposition: A | Discharge status: Home / Self Care | Payer: Medicare Other | Attending: Family Medicine | Admitting: Family Medicine

## 2020-06-20 ENCOUNTER — Encounter (HOSPITAL_COMMUNITY): Payer: Self-pay | Admitting: Emergency Medicine

## 2020-06-20 ENCOUNTER — Emergency Department (HOSPITAL_COMMUNITY): Payer: Medicare Other

## 2020-06-20 ENCOUNTER — Telehealth: Payer: Self-pay | Admitting: Family Medicine

## 2020-06-20 DIAGNOSIS — F3170 Bipolar disorder, currently in remission, most recent episode unspecified: Secondary | ICD-10-CM | POA: Diagnosis not present

## 2020-06-20 DIAGNOSIS — K219 Gastro-esophageal reflux disease without esophagitis: Secondary | ICD-10-CM | POA: Diagnosis present

## 2020-06-20 DIAGNOSIS — I10 Essential (primary) hypertension: Secondary | ICD-10-CM | POA: Diagnosis not present

## 2020-06-20 DIAGNOSIS — N179 Acute kidney failure, unspecified: Secondary | ICD-10-CM | POA: Diagnosis present

## 2020-06-20 DIAGNOSIS — E119 Type 2 diabetes mellitus without complications: Secondary | ICD-10-CM | POA: Insufficient documentation

## 2020-06-20 DIAGNOSIS — N390 Urinary tract infection, site not specified: Principal | ICD-10-CM

## 2020-06-20 DIAGNOSIS — Z20822 Contact with and (suspected) exposure to covid-19: Secondary | ICD-10-CM | POA: Diagnosis not present

## 2020-06-20 DIAGNOSIS — F04 Amnestic disorder due to known physiological condition: Secondary | ICD-10-CM | POA: Diagnosis present

## 2020-06-20 DIAGNOSIS — E876 Hypokalemia: Secondary | ICD-10-CM

## 2020-06-20 DIAGNOSIS — R531 Weakness: Secondary | ICD-10-CM

## 2020-06-20 DIAGNOSIS — R300 Dysuria: Secondary | ICD-10-CM | POA: Diagnosis present

## 2020-06-20 LAB — COMPREHENSIVE METABOLIC PANEL
ALT: 41 U/L (ref 0–44)
AST: 44 U/L — ABNORMAL HIGH (ref 15–41)
Albumin: 3.4 g/dL — ABNORMAL LOW (ref 3.5–5.0)
Alkaline Phosphatase: 282 U/L — ABNORMAL HIGH (ref 38–126)
Anion gap: 14 (ref 5–15)
BUN: 34 mg/dL — ABNORMAL HIGH (ref 8–23)
CO2: 18 mmol/L — ABNORMAL LOW (ref 22–32)
Calcium: 9 mg/dL (ref 8.9–10.3)
Chloride: 98 mmol/L (ref 98–111)
Creatinine, Ser: 1.77 mg/dL — ABNORMAL HIGH (ref 0.44–1.00)
GFR calc Af Amer: 34 mL/min — ABNORMAL LOW (ref >60)
GFR calc non Af Amer: 29 mL/min — ABNORMAL LOW (ref >60)
Glucose, Bld: 217 mg/dL — ABNORMAL HIGH (ref 70–99)
Potassium: 2.7 mmol/L — CL (ref 3.5–5.1)
Sodium: 130 mmol/L — ABNORMAL LOW (ref 135–145)
Total Bilirubin: 0.6 mg/dL (ref 0.3–1.2)
Total Protein: 6.9 g/dL (ref 6.5–8.1)

## 2020-06-20 LAB — URINALYSIS, ROUTINE W REFLEX MICROSCOPIC
Bilirubin Urine: NEGATIVE
Glucose, UA: NEGATIVE mg/dL
Hgb urine dipstick: NEGATIVE
Ketones, ur: NEGATIVE mg/dL
Nitrite: POSITIVE — AB
Protein, ur: 30 mg/dL — AB
Specific Gravity, Urine: 1.015 (ref 1.005–1.030)
WBC, UA: >50 WBC/hpf — ABNORMAL HIGH (ref 0–5)
pH: 5 (ref 5.0–8.0)

## 2020-06-20 LAB — CBC WITH DIFFERENTIAL/PLATELET
Abs Immature Granulocytes: 0.09 10*3/uL — ABNORMAL HIGH (ref 0.00–0.07)
Basophils Absolute: 0.1 10*3/uL (ref 0.0–0.1)
Basophils Relative: 1 %
Eosinophils Absolute: 0.1 10*3/uL (ref 0.0–0.5)
Eosinophils Relative: 1 %
HCT: 42.8 % (ref 36.0–46.0)
Hemoglobin: 14.5 g/dL (ref 12.0–15.0)
Immature Granulocytes: 1 %
Lymphocytes Relative: 10 %
Lymphs Abs: 1.3 10*3/uL (ref 0.7–4.0)
MCH: 29.9 pg (ref 26.0–34.0)
MCHC: 33.9 g/dL (ref 30.0–36.0)
MCV: 88.2 fL (ref 80.0–100.0)
Monocytes Absolute: 0.8 10*3/uL (ref 0.1–1.0)
Monocytes Relative: 6 %
Neutro Abs: 10.5 10*3/uL — ABNORMAL HIGH (ref 1.7–7.7)
Neutrophils Relative %: 81 %
Platelets: 303 10*3/uL (ref 150–400)
RBC: 4.85 MIL/uL (ref 3.87–5.11)
RDW: 12.7 % (ref 11.5–15.5)
WBC: 12.8 10*3/uL — ABNORMAL HIGH (ref 4.0–10.5)
nRBC: 0 % (ref 0.0–0.2)

## 2020-06-20 LAB — RENAL FUNCTION PANEL
Albumin: 2.9 g/dL — ABNORMAL LOW (ref 3.5–5.0)
Anion gap: 11 (ref 5–15)
BUN: 29 mg/dL — ABNORMAL HIGH (ref 8–23)
CO2: 17 mmol/L — ABNORMAL LOW (ref 22–32)
Calcium: 8.4 mg/dL — ABNORMAL LOW (ref 8.9–10.3)
Chloride: 104 mmol/L (ref 98–111)
Creatinine, Ser: 1.45 mg/dL — ABNORMAL HIGH (ref 0.44–1.00)
GFR calc Af Amer: 43 mL/min — ABNORMAL LOW (ref >60)
GFR calc non Af Amer: 37 mL/min — ABNORMAL LOW (ref >60)
Glucose, Bld: 211 mg/dL — ABNORMAL HIGH (ref 70–99)
Phosphorus: 3.3 mg/dL (ref 2.5–4.6)
Potassium: 3.3 mmol/L — ABNORMAL LOW (ref 3.5–5.1)
Sodium: 132 mmol/L — ABNORMAL LOW (ref 135–145)

## 2020-06-20 LAB — CK: Total CK: 60 U/L (ref 38–234)

## 2020-06-20 LAB — TROPONIN I (HIGH SENSITIVITY)
Troponin I (High Sensitivity): 13 ng/L (ref <18)
Troponin I (High Sensitivity): 10 ng/L (ref <18)

## 2020-06-20 LAB — TSH: TSH: 1.199 u[IU]/mL (ref 0.350–4.500)

## 2020-06-20 LAB — LACTIC ACID, PLASMA
Lactic Acid, Venous: 2.9 mmol/L (ref 0.5–1.9)
Lactic Acid, Venous: 1.4 mmol/L (ref 0.5–1.9)

## 2020-06-20 LAB — CREATININE, URINE, RANDOM: Creatinine, Urine: 82.03 mg/dL

## 2020-06-20 LAB — SODIUM, URINE, RANDOM: Sodium, Ur: 24 mmol/L

## 2020-06-20 LAB — SARS CORONAVIRUS 2 BY RT PCR (HOSPITAL ORDER, PERFORMED IN ~~LOC~~ HOSPITAL LAB): SARS Coronavirus 2: NEGATIVE

## 2020-06-20 LAB — FOLATE: Folate: 15.9 ng/mL (ref >5.9)

## 2020-06-20 MED ORDER — PANTOPRAZOLE SODIUM 40 MG PO TBEC
80.0000 mg | DELAYED_RELEASE_TABLET | Freq: Every day | ORAL | Status: DC
  Administered 2020-06-21: 80 mg via ORAL
  Filled 2020-06-20: qty 2

## 2020-06-20 MED ORDER — PANTOPRAZOLE SODIUM 40 MG PO TBEC: 40.0000 mg | DELAYED_RELEASE_TABLET | Freq: Every day | ORAL | Status: DC

## 2020-06-20 MED ORDER — QUETIAPINE FUMARATE 100 MG PO TABS
200.0000 mg | ORAL_TABLET | Freq: Every day | ORAL | Status: DC
  Administered 2020-06-20: 200 mg via ORAL
  Filled 2020-06-20: qty 2

## 2020-06-20 MED ORDER — DONEPEZIL HCL 5 MG PO TABS
10.0000 mg | ORAL_TABLET | Freq: Every day | ORAL | Status: DC
  Administered 2020-06-21: 10 mg via ORAL
  Filled 2020-06-20: qty 1
  Filled 2020-06-20: qty 2

## 2020-06-20 MED ORDER — POTASSIUM CHLORIDE CRYS ER 20 MEQ PO TBCR
40.0000 meq | EXTENDED_RELEASE_TABLET | Freq: Once | ORAL | Status: AC
  Administered 2020-06-20: 40 meq via ORAL
  Filled 2020-06-20: qty 2

## 2020-06-20 MED ORDER — DULOXETINE HCL 60 MG PO CPEP
60.0000 mg | ORAL_CAPSULE | Freq: Every day | ORAL | Status: DC
  Administered 2020-06-21: 60 mg via ORAL
  Filled 2020-06-20: qty 1

## 2020-06-20 MED ORDER — SODIUM CHLORIDE 0.9 % IV SOLN
1.0000 g | Freq: Two times a day (BID) | INTRAVENOUS | Status: DC
  Administered 2020-06-20 – 2020-06-21 (×2): 1 g via INTRAVENOUS
  Filled 2020-06-20 (×2): qty 1

## 2020-06-20 MED ORDER — MAGNESIUM SULFATE IN D5W 1-5 GM/100ML-% IV SOLN
1.0000 g | Freq: Once | INTRAVENOUS | Status: AC
  Administered 2020-06-20: 1 g via INTRAVENOUS
  Filled 2020-06-20: qty 100

## 2020-06-20 MED ORDER — POTASSIUM CHLORIDE 10 MEQ/100ML IV SOLN
10.0000 meq | INTRAVENOUS | Status: AC
  Administered 2020-06-20 (×4): 10 meq via INTRAVENOUS
  Filled 2020-06-20 (×4): qty 100

## 2020-06-20 MED ORDER — GABAPENTIN 400 MG PO CAPS
400.0000 mg | ORAL_CAPSULE | Freq: Three times a day (TID) | ORAL | Status: DC
  Administered 2020-06-20 – 2020-06-21 (×2): 400 mg via ORAL
  Filled 2020-06-20 (×2): qty 1

## 2020-06-20 MED ORDER — SODIUM CHLORIDE 0.9 % IV BOLUS
1000.0000 mL | Freq: Once | INTRAVENOUS | Status: AC
  Administered 2020-06-20: 1000 mL via INTRAVENOUS

## 2020-06-20 MED ORDER — ADULT MULTIVITAMIN LIQUID CH
15.0000 mL | Freq: Every day | ORAL | Status: DC
  Administered 2020-06-21: 15 mL via ORAL
  Filled 2020-06-20 (×2): qty 15

## 2020-06-20 MED ORDER — LAMOTRIGINE 100 MG PO TABS
200.0000 mg | ORAL_TABLET | Freq: Every day | ORAL | Status: DC
  Administered 2020-06-21: 200 mg via ORAL
  Filled 2020-06-20: qty 2

## 2020-06-20 MED ORDER — VITAMIN D (ERGOCALCIFEROL) 1.25 MG (50000 UNIT) PO CAPS
50000.0000 [IU] | ORAL_CAPSULE | ORAL | Status: DC
  Filled 2020-06-20: qty 1

## 2020-06-20 MED ORDER — QUETIAPINE FUMARATE 25 MG PO TABS
50.0000 mg | ORAL_TABLET | Freq: Every day | ORAL | Status: DC
  Administered 2020-06-21: 50 mg via ORAL
  Filled 2020-06-20: qty 2

## 2020-06-20 MED ORDER — POTASSIUM CHLORIDE IN NACL 20-0.9 MEQ/L-% IV SOLN: INTRAVENOUS | Status: DC

## 2020-06-20 MED ORDER — HEPARIN SODIUM (PORCINE) 5000 UNIT/ML IJ SOLN
5000.0000 [IU] | Freq: Three times a day (TID) | INTRAMUSCULAR | Status: DC
  Administered 2020-06-20 – 2020-06-21 (×2): 5000 [IU] via SUBCUTANEOUS
  Filled 2020-06-20 (×3): qty 1

## 2020-06-20 MED ORDER — PANTOPRAZOLE SODIUM 40 MG PO PACK: 40.0000 mg | PACK | Freq: Every day | ORAL | Status: DC

## 2020-06-20 NOTE — ED Triage Notes (Signed)
Pt sent over by PCP ([facility]) for elevated WBC and "something with my kidney function." Pt states she is currently taking antibiotics for a UTI. Pt hypotensive 83/67 and tachycardic 113 in triage.

## 2020-06-20 NOTE — ED Provider Notes (Signed)
[facility] EMERGENCY DEPARTMENT Provider Note   CSN: [id number] Arrival date & time: 06/20/20  1318     History Chief Complaint  Patient presents with  . Abnormal Lab    Erin Donaldson is a 69 y.o. female who presents to the ED from [facility] for abnormal labs.  Patient tells me that she has had multiple falls in the past few weeks and has been a bit more unsteady on her feet.  She is endorsing a 1 week history of chest pain.  She states that her urine has also been darker in character.  She has been placed on an antibiotic by her primary care provider, but is unaware of which one specifically.  She was instructed to come to the ED on Monday, however she adopted a 35-year-old son and had a stay at home with him.  She has multiple bandages and bruising noted on her arm and torso.  Patient notes that she had a recent staph infection on right forearm that had improved.  She has a large area of ecchymoses over left side of chest.  Patient endorses recent dysuria and dark urine.  She denies any obvious fevers or chills, headache, difficulty breathing, cough, abdominal pain, nausea or vomiting, back pain, or changes in bowel habits.    I reviewed correspondence between Dr. Nadine Counts, her primary care provider, and patient.  Her PCP was concerned for dehydration in the setting of patient's history of Roux-en-Y gastric bypass.  She was encouraged to go to the ER for IV hydration and further work-up given that she has been persistently tachycardic and hypotensive in conjunction with recent lightheadedness and falls.  Patient has seen Dr. Antoine Poche in the past.  Patient's laboratory work-up resulted shortly thereafter and her PCP prescribed patient ciprofloxacin given concern for pyelonephritis.     HPI     Past Medical History:  Diagnosis Date  . Anemia   . Anxiety   . Bipolar affective (HCC)   . Depression   . Diabetes in pregnancy   . Diabetes mellitus   . Elevated  LFTs   . GERD (gastroesophageal reflux disease)   . Hyperlipidemia   . Hypertension   . IBS (irritable bowel syndrome)   . Migraine     Patient Active Problem List   Diagnosis Date Noted  . AKI (acute kidney injury) (HCC) 06/20/2020  . Vitamin D deficiency 10/03/2018  . Overweight (BMI 25.0-29.9) 04/26/2018  . Senile purpura (HCC) 04/04/2018  . Abnormal EKG 12/29/2017  . Iron deficiency anemia 01/24/2016  . Swelling of right lower extremity 07/15/2015  . Right hip pain 07/15/2015  . Microcytic anemia 07/15/2015  . Elevated LFTs   . History of gastric bypass 01/15/2014  . Amnestic disorder due to medical condition (HCC) 08/23/2013  . Diarrhea 07/19/2013  . Bipolar affective disorder in remission (HCC) 08/26/2009  . ANXIETY 08/26/2009  . Migraine headache 08/26/2009  . Hyperlipidemia associated with type 2 diabetes mellitus (HCC) 08/15/2009  . Hyperlipidemia 08/15/2009  . Essential hypertension 08/15/2009  . GERD 08/15/2009  . CONSTIPATION 08/15/2009  . IRRITABLE BOWEL SYNDROME 08/15/2009    Past Surgical History:  Procedure Laterality Date  . CHOLECYSTECTOMY    . GASTRIC BYPASS  2005     OB History   No obstetric history on file.     Family History  Problem Relation Age of Onset  . Kidney disease Father   . Heart disease Father 20       CABG  .  Diabetes Father   . CAD Father   . Heart disease Mother [id number]       CAD  . Breast cancer Mother   . Cancer Mother        bladder  . Heart disease Maternal Grandfather   . Heart attack Maternal Grandfather   . Stroke Maternal Grandfather   . Heart disease Brother   . Dementia Brother   . Heart disease Maternal Aunt   . Heart disease Maternal Uncle   . Heart disease Paternal Aunt   . Heart disease Paternal Uncle   . Diabetes Maternal Aunt   . Diabetes Maternal Uncle   . Diabetes Paternal Aunt   . Diabetes Paternal Uncle   . Stroke Maternal Grandmother   . Heart disease Paternal Grandfather   . Diabetes Daughter    . Drug abuse Daughter   . Depression Sister   . Hypertension Sister   . Alcohol abuse Brother   . Drug abuse Brother   . Other Brother        accident caused brain swelling   . Colon cancer Neg Hx     Social History   Tobacco Use  . Smoking status: Never Smoker  . Smokeless tobacco: Never Used  Vaping Use  . Vaping Use: Never used  Substance Use Topics  . Alcohol use: No    Alcohol/week: 0.0 standard drinks    Comment: rare  . Drug use: Yes    Types: Mescaline    Home Medications Prior to Admission medications   Medication Sig Start Date End Date Taking? Authorizing Provider  albuterol (VENTOLIN HFA) 108 (90 Base) MCG/ACT inhaler Inhale 2 puffs into the lungs every 6 (six) hours as needed for wheezing or shortness of breath. 12/04/19   Junie SpencerHawks, Christy A, FNP  cetirizine (ZYRTEC) 10 MG tablet Take 1 tablet (10 mg total) by mouth daily. 02/12/20   Sonny Mastersakes, Linda M, FNP  ciprofloxacin (CIPRO) 500 MG tablet Take 1 tablet (500 mg total) by mouth 2 (two) times daily for 7 days. 06/17/20 06/24/20  Raliegh IpGottschalk, Ashly M, DO  cyclobenzaprine (FLEXERIL) 10 MG tablet Take 0.5-1 tablets (5-10 mg total) by mouth 2 (two) times daily as needed for muscle spasms. 06/14/20   Delynn FlavinGottschalk, Ashly M, DO  donepezil (ARICEPT) 10 MG tablet TAKE 1 TABLET BY MOUTH EVERY DAY 05/21/20   Delynn FlavinGottschalk, Ashly M, DO  doxycycline (VIBRA-TABS) 100 MG tablet Take 1 tablet (100 mg total) by mouth 2 (two) times daily. 06/06/20   Junie SpencerHawks, Christy A, FNP  DULoxetine (CYMBALTA) 60 MG capsule TAKE 1 CAPSULE BY MOUTH EVERY DAY 02/13/20   Cottle, Steva Readyarey G Jr., MD  fluticasone [facility](FLONASE) 50 MCG/ACT nasal spray SPRAY 2 SPRAYS INTO EACH NOSTRIL EVERY DAY 02/12/20   Sonny Mastersakes, Linda M, FNP  furosemide (LASIX) 20 MG tablet TAKE 1 TABLET BY MOUTH EVERY DAY 05/27/20   Delynn FlavinGottschalk, Ashly M, DO  gabapentin (NEURONTIN) 300 MG capsule TAKE 3 CAPSULES BY MOUTH THREE TIMES A DAY 02/02/20   Gottschalk, Kathie RhodesAshly M, DO  lamoTRIgine (LAMICTAL) 200 MG tablet TAKE 1  TABLET BY MOUTH EVERY DAY 12/18/19   Cottle, Steva Readyarey G Jr., MD  meclizine (ANTIVERT) 25 MG tablet TAKE 1 TABLET (25 MG TOTAL) BY MOUTH 3 (THREE) TIMES DAILY AS NEEDED FOR DIZZINESS. 05/03/20   Raliegh IpGottschalk, Ashly M, DO  nystatin cream (MYCOSTATIN) Apply topically 2 (two) times daily. 04/16/20   Jannifer RodneyHawks, Christy A, FNP  pantoprazole (PROTONIX) 40 MG tablet TAKE 2 TABLETS BY MOUTH EVERY DAY 11/13/19  Gottschalk, Ashly M, DO  QUEtiapine (SEROQUEL) 200 MG tablet TAKE 1 TABLET BY MOUTH EVERY DAY 02/13/20   Cottle, Steva Ready., MD  QUEtiapine (SEROQUEL) 400 MG tablet TAKE 2 TABLETS (800 MG TOTAL) BY MOUTH AT BEDTIME. 04/08/20   Cottle, Steva Ready., MD  rizatriptan (MAXALT) 10 MG tablet Take 1 tablet (10 mg total) by mouth as needed. May repeat in 2 hours if needed 11/03/18   Rakes, Doralee Albino, FNP  Vitamin D, Ergocalciferol, (DRISDOL) 1.25 MG (50000 UT) CAPS capsule TAKE 1 CAPSULE (50,000 UNITS TOTAL) BY MOUTH 2 (TWO) TIMES A WEEK. 08/27/18   [provider]    Allergies    Bactrim [sulfamethoxazole-trimethoprim], Darvocet [propoxyphene n-acetaminophen], Decongestant [oxymetazoline], Elavil [amitriptyline], Lithium, Risperidone and related, Ultram [tramadol], and Keflex [cephalexin]  Review of Systems   Review of Systems  All other systems reviewed and are negative.   Physical Exam Updated Vital Signs BP 127/74   Pulse 99   Temp 98.3 F (36.8 C) (Oral)   Resp 16   Ht  (1.626 m)   Wt 67.1 kg   SpO2 100%   BMI 25.40 kg/m   Physical Exam Vitals and nursing note reviewed. Exam conducted with a chaperone present.  Constitutional:      General: She is not in acute distress.    Appearance: She is ill-appearing.  HENT:     Head: Normocephalic and atraumatic.     Mouth/Throat:     Mouth: Mucous membranes are dry.  Eyes:     General: No scleral icterus.    Conjunctiva/sclera: Conjunctivae normal.  Cardiovascular:     Rate and Rhythm: Tachycardia present.     Pulses: Normal pulses.    Pulmonary:     Effort: Pulmonary effort is normal.  Abdominal:     General: Abdomen is flat. There is no distension.     Palpations: Abdomen is soft.     Tenderness: There is no abdominal tenderness. There is no right CVA tenderness, left CVA tenderness or guarding.  Musculoskeletal:     Right lower leg: No edema.     Left lower leg: No edema.  Skin:    General: Skin is dry.     Capillary Refill: Capillary refill takes less than 2 seconds.  Neurological:     Mental Status: She is alert and oriented to person, place, and time.     GCS: GCS eye subscore is 4. GCS verbal subscore is 5. GCS motor subscore is 6.  Psychiatric:        Mood and Affect: Mood normal.        Behavior: Behavior normal.        Thought Content: Thought content normal.     ED Results / Procedures / Treatments   Labs (all labs ordered are listed, but only abnormal results are displayed) Labs Reviewed  COMPREHENSIVE METABOLIC PANEL - Abnormal; Notable for the following components:      Result Value   Sodium 130 (*)    Potassium 2.7 (*)    CO2 18 (*)    Glucose, Bld 217 (*)    BUN 34 (*)    Creatinine, Ser 1.77 (*)    Albumin 3.4 (*)    AST 44 (*)    Alkaline Phosphatase 282 (*)    GFR calc non Af Amer 29 (*)    GFR calc Af Amer 34 (*)    All other components within normal limits  CBC WITH DIFFERENTIAL/PLATELET - Abnormal; Notable for the  following components:   WBC 12.8 (*)    Neutro Abs 10.5 (*)    Abs Immature Granulocytes 0.09 (*)    All other components within normal limits  LACTIC ACID, PLASMA - Abnormal; Notable for the following components:   Lactic Acid, Venous 2.9 (*)    All other components within normal limits  CULTURE, BLOOD (ROUTINE X 2)  CULTURE, BLOOD (ROUTINE X 2)  URINE CULTURE  SARS CORONAVIRUS 2 BY RT PCR (HOSPITAL ORDER, PERFORMED IN [facility])  CK  LACTIC ACID, PLASMA  URINALYSIS, ROUTINE W REFLEX MICROSCOPIC  SODIUM, URINE, RANDOM  CREATININE, URINE,  RANDOM  TROPONIN I (HIGH SENSITIVITY)  TROPONIN I (HIGH SENSITIVITY)    EKG None  Radiology DG Chest 2 View  Result Date: 06/20/2020 CLINICAL DATA:  Chest pain EXAM: CHEST - 2 VIEW COMPARISON:  May 14, 2019 chest radiograph and chest CT May 14, 2019 FINDINGS: There is no evident edema or airspace opacity. The heart size and pulmonary vascularity are normal. No adenopathy. No evident bone lesions. IMPRESSION: Lungs clear.  Cardiac silhouette within normal limits. Electronically Signed   By: Bretta Bang III M.D.   On: 06/20/2020 15:45    Procedures Procedures (including critical care time)  Medications Ordered in ED Medications  potassium chloride 10 mEq in 100 mL IVPB (has no administration in time range)  potassium chloride SA (KLOR-CON) CR tablet 40 mEq (has no administration in time range)  sodium chloride 0.9 % bolus 1,000 mL (0 mLs Intravenous Stopped 06/20/20 1600)    ED Course  I have reviewed the triage vital signs and the nursing notes.  Pertinent labs & imaging results that were available during my care of the patient were reviewed by me and considered in my medical decision making (see chart for details).  Clinical Course as of Jun 20 1701  Thu Jun 20, 2020  1701 Spoke with Dr. Randol Kern who will evaluate patient for admission.     [GG]    Clinical Course User Index [GG] Lorelee New, PA-C   MDM Rules/Calculators/A&P                          Patient is clinically dehydrated on my examination, possibly why she was initially hypotensive to 83/67 with a heart rate of 113 on initial arrival here to the ED.  This could represent a hypokalemia picture.  She states that she has been making a concerted effort to drink plenty of fluids, combination of water and sweet tea.  She does endorse dysuria and dark urine, but has been taking her recently prescribed ciprofloxacin as directed.  Labs CBC with differential: Leukocytosis to 12.8, improved from 15.4 in labs  obtained 6 days ago. Lactic acid: 2.9 >>  Troponin: WNL. CK: WNL. CMP: Worsening in her laboratory work-up when compared to labs obtained 6 days ago.  Patient is now hyponatremic to 130, hypokalemic to 2.7, and has continued worsening in her renal function.  Creatinine elevated 1.77 and GFR reduced to 29, compared to normal renal function 3 months ago. UA/culture: Pending.    Imaging I personally reviewed DG chest 2 view which was obtained given patient's complaint of chest chest pain.  Cardiac silhouette is within normal limits and there is no evidence of acute cardiopulmonary disease.  EKG: Personally reviewed EKG, sinus tachycardia noted.   Given patient's lightheadedness and falls in the recent weeks with her persistent hypotension and tachycardia despite emphasis on oral  hydration outpatient, patient would likely benefit from admission for observation.  She has a significant AKI with creatinine that has doubled when compared to labs obtained 3 months ago.  She also has a significant hypokalemia to 2.7 which will replenish with IV and p.o. potassium.  Patient's vital signs have improved with IV NS, however patient would benefit from continued IVF and laboratory recheck in the morning to ensure correction of her AKI and hyperkalemia.  Will consult hospitalist for admission.  Spoke with Dr. Randol Kern who will evaluate patient for admission.     Final Clinical Impression(s) / ED Diagnoses Final diagnoses:  AKI (acute kidney injury) (HCC)  Hypokalemia    Rx / DC Orders ED Discharge Orders    None       Lorelee New, PA-C 06/20/20 1702    Derwood Kaplan, MD 06/21/20 1556

## 2020-06-20 NOTE — ED Notes (Signed)
CRITICAL VALUE ALERT  Critical Value:  Lactic acid 2.9 Date & Time Notied:  06/20/20, 1525  Provider Notified: Chilton Si, pa  Orders Received/Actions taken: no orders received

## 2020-06-20 NOTE — Progress Notes (Signed)
Pharmacy Antibiotic Note  Erin Donaldson is a 69 y.o. female admitted on 06/20/2020 with UTI.  Pharmacy has been consulted for merrem dosing.  Plan: merrem 1gm iv q12h  Height: 5\' 4"  (162.6 cm) Weight: 67.1 kg (148 lb) IBW/kg (Calculated) : 54.7  Temp (24hrs), Avg:98.3 F (36.8 C), Min:98.3 F (36.8 C), Max:98.3 F (36.8 C)  Recent Labs  Lab 06/14/20 1516 06/20/20 1432 06/20/20 1636  WBC 15.4* 12.8*  --   CREATININE 1.57* 1.77*  --   LATICACIDVEN  --  2.9* 1.4    Estimated Creatinine Clearance: 29.5 mL/min (A) (by C-G formula based on SCr of 1.77 mg/dL (H)).    Allergies  Allergen Reactions  . Bactrim [Sulfamethoxazole-Trimethoprim]     Raw and irritatated in mouth.  08/20/20 [Propoxyphene N-Acetaminophen]   . Decongestant [Oxymetazoline]   . Elavil [Amitriptyline]   . Lithium Other (See Comments)    Hx toxicity  . Risperidone And Related   . Ultram [Tramadol]   . Keflex [Cephalexin] Rash    Antimicrobials this admission: 8/5 merrem >>  Microbiology results: 8/5 BCx: sent 8/5 UCx: sent  8/5 Covid 19: sent   Thank you for allowing pharmacy to be a part of this patient's care.  10/5 Navi Ewton 06/20/2020 5:11 PM

## 2020-06-20 NOTE — Telephone Encounter (Signed)
Patient already at ED

## 2020-06-20 NOTE — H&P (Signed)
TRH H&P   Patient Demographics:    Erin Donaldson, is a 69 y.o. female  MRN: [id number]   DOB - 1951-08-18  Admit Date - 06/20/2020  Outpatient Primary MD for the patient is Janora Norlander, DO  Referring MD/NP/PA: PA Green  Patient coming from: Home  Chief Complaint  Patient presents with  . Abnormal Lab      HPI:    Erin Donaldson  is a 69 y.o. female, with past medical history of gastric bypass surgery, bipolar disorder, elevated LFTs, hyperlipidemia, hypertension, insomnia, to ED from [facility] for abnormal labs, [name] due to AKI, patient reports she had a gastric bypass surgery multiple years ago, with significant weight loss, she does report history of multiple falls in the past few weeks, she has been feeling unsteady, as well she does report dysuria, as well as been darker in color, for which she has been seen by PCP, for which he started her on ciprofloxacin for UTI, as well she reports she just finished doxycycline course for wound MRSA infection, patient was directed by PCP to come to ED for worsening renal function, patient reports poor fluid intake, dizziness, lightheadedness, but she denies any fever or chills. - in ED patient blood pressure was soft, which did respond to fluid bolus, her work-up was significant for chronically elevated alk phos at 282, hypokalemia with potassium of 2.7, hyponatremia with sodium of 130, she had elevated lactic acid, leukocytosis at 12.8, she had positive urine analysis, triage hospitalist consulted to admit   Review of systems:    In addition to the HPI above,  No Fever-chills, reports generalized weakness, poor appetite, multiple falls No Headache, No changes with Vision or hearing, No problems swallowing food or Liquids, No Chest pain, Cough or Shortness of Breath, No Abdominal pain, No Nausea or Vommitting,  Bowel movements are regular, No Blood in stool or Urine, She does report dysuria No new skin rashes or bruises, No new joints pains-aches,  No new weakness, tingling, numbness in any extremity, No recent weight gain or loss, No polyuria, polydypsia or polyphagia, No significant Mental Stressors.  A full 10 point Review of Systems was done, except as stated above, all other Review of Systems were negative.   With Past History of the following :    Past Medical History:  Diagnosis Date  . Anemia   . Anxiety   . Bipolar affective ([place])   . Depression   . Diabetes in pregnancy   . Diabetes mellitus   . Elevated LFTs   . GERD (gastroesophageal reflux disease)   . Hyperlipidemia   . Hypertension   . IBS (irritable bowel syndrome)   . Migraine       Past Surgical History:  Procedure Laterality Date  . CHOLECYSTECTOMY    . GASTRIC BYPASS  2005      Social History:  Social History   Tobacco Use  . Smoking status: Never Smoker  . Smokeless tobacco: Never Used  Substance Use Topics  . Alcohol use: No    Alcohol/week: 0.0 standard drinks    Comment: rare       Family History :     Family History  Problem Relation Age of Onset  . Kidney disease Father   . Heart disease Father 53       CABG  . Diabetes Father   . CAD Father   . Heart disease Mother 106       CAD  . Breast cancer Mother   . Cancer Mother        bladder  . Heart disease Maternal Grandfather   . Heart attack Maternal Grandfather   . Stroke Maternal Grandfather   . Heart disease Brother   . Dementia Brother   . Heart disease Maternal Aunt   . Heart disease Maternal Uncle   . Heart disease Paternal Aunt   . Heart disease Paternal Uncle   . Diabetes Maternal Aunt   . Diabetes Maternal Uncle   . Diabetes Paternal Aunt   . Diabetes Paternal Uncle   . Stroke Maternal Grandmother   . Heart disease Paternal Grandfather   . Diabetes Daughter   . Drug abuse Daughter   . Depression Sister     . Hypertension Sister   . Alcohol abuse Brother   . Drug abuse Brother   . Other Brother        accident caused brain swelling   . Colon cancer Neg Hx      Home Medications:   Prior to Admission medications   Medication Sig Start Date End Date Taking? Authorizing Provider  albuterol (VENTOLIN HFA) 108 (90 Base) MCG/ACT inhaler Inhale 2 puffs into the lungs every 6 (six) hours as needed for wheezing or shortness of breath. 12/04/19  Yes Hawks, Christy A, FNP  cetirizine (ZYRTEC) 10 MG tablet Take 1 tablet (10 mg total) by mouth daily. 02/12/20  Yes Rakes, Connye Burkitt, FNP  ciprofloxacin (CIPRO) 500 MG tablet Take 1 tablet (500 mg total) by mouth 2 (two) times daily for 7 days. 06/17/20 06/24/20 Yes Gottschalk, Leatrice Jewels M, DO  cyclobenzaprine (FLEXERIL) 10 MG tablet Take 0.5-1 tablets (5-10 mg total) by mouth 2 (two) times daily as needed for muscle spasms. 06/14/20  Yes Gottschalk, Leatrice Jewels M, DO  DULoxetine (CYMBALTA) 60 MG capsule TAKE 1 CAPSULE BY MOUTH EVERY DAY 02/13/20  Yes Cottle, Billey Co., MD  fluticasone [facility]) 50 MCG/ACT nasal spray SPRAY 2 SPRAYS INTO EACH NOSTRIL EVERY DAY 02/12/20  Yes Rakes, Connye Burkitt, FNP  furosemide (LASIX) 20 MG tablet TAKE 1 TABLET BY MOUTH EVERY DAY 05/27/20  Yes Gottschalk, Ashly M, DO  gabapentin (NEURONTIN) 300 MG capsule TAKE 3 CAPSULES BY MOUTH THREE TIMES A DAY Patient taking differently: Take 900 mg by mouth 3 (three) times daily.  02/02/20  Yes Gottschalk, Leatrice Jewels M, DO  lamoTRIgine (LAMICTAL) 200 MG tablet TAKE 1 TABLET BY MOUTH EVERY DAY 12/18/19  Yes Cottle, Billey Co., MD  meclizine (ANTIVERT) 25 MG tablet TAKE 1 TABLET (25 MG TOTAL) BY MOUTH 3 (THREE) TIMES DAILY AS NEEDED FOR DIZZINESS. 05/03/20  Yes Ronnie Doss M, DO  nystatin cream (MYCOSTATIN) Apply topically 2 (two) times daily. 04/16/20  Yes Hawks, Alyse Low A, FNP  pantoprazole (PROTONIX) 40 MG tablet TAKE 2 TABLETS BY MOUTH EVERY DAY 11/13/19  Yes Gottschalk, Ashly M, DO  QUEtiapine (SEROQUEL) 200  MG  tablet TAKE 1 TABLET BY MOUTH EVERY DAY 02/13/20  Yes Cottle, Billey Co., MD  QUEtiapine (SEROQUEL) 400 MG tablet TAKE 2 TABLETS (800 MG TOTAL) BY MOUTH AT BEDTIME. 04/08/20  Yes Cottle, Billey Co., MD  rizatriptan (MAXALT) 10 MG tablet Take 1 tablet (10 mg total) by mouth as needed. May repeat in 2 hours if needed 11/03/18  Yes Rakes, Connye Burkitt, FNP  Vitamin D, Ergocalciferol, (DRISDOL) 1.25 MG (50000 UT) CAPS capsule Take 50,000 Units by mouth 2 (two) times a week.  08/27/18  Yes [provider]  donepezil (ARICEPT) 10 MG tablet TAKE 1 TABLET BY MOUTH EVERY DAY 05/21/20   Janora Norlander, DO     Allergies:     Allergies  Allergen Reactions  . Bactrim [Sulfamethoxazole-Trimethoprim]     Raw and irritatated in mouth.  Carlton Adam [Propoxyphene N-Acetaminophen]   . Decongestant [Oxymetazoline]   . Elavil [Amitriptyline]   . Lithium Other (See Comments)    Hx toxicity  . Risperidone And Related   . Ultram [Tramadol]   . Keflex [Cephalexin] Rash     Physical Exam:   Vitals  Blood pressure (!) 143/86, pulse (!) 107, temperature 98.3 F (36.8 C), temperature source Oral, resp. rate 20, height '5\' 4"'  (1.626 m), weight 67.1 kg, SpO2 94 %.   1. General well developed female, laying in bed, in no apparent distress  2. Normal affect and insight, Not Suicidal or Homicidal, Awake Alert, Oriented X 3.  3. No F.N deficits, ALL C.Nerves Intact, Strength 5/5 all 4 extremities, Sensation intact all 4 extremities, Plantars down going.  4. Ears and Eyes appear Normal, Conjunctivae clear, PERRLA. Moist Oral Mucosa.  5. Supple Neck, No JVD, No cervical lymphadenopathy appriciated, No Carotid Bruits.  6. Symmetrical Chest wall movement, Good air movement bilaterally, CTAB.  7. RRR, No Gallops, Rubs or Murmurs, No Parasternal Heave.  8. Positive Bowel Sounds, Abdomen Soft, No tenderness, No organomegaly appriciated,No rebound -guarding or rigidity.  9.  No Cyanosis, Normal Skin  Turgor, No Skin Rash or Bruise.  10. Good muscle tone,  joints appear normal , no effusions, Normal ROM.  11. No Palpable Lymph Nodes in Neck or Axillae     Data Review:    CBC Recent Labs  Lab 06/14/20 1516 06/20/20 1432  WBC 15.4* 12.8*  HGB 14.2 14.5  HCT 41.6 42.8  PLT 396 303  MCV 89 88.2  MCH 30.3 29.9  MCHC 34.1 33.9  RDW 12.9 12.7  LYMPHSABS 1.6 1.3  MONOABS  --  0.8  EOSABS 0.1 0.1  BASOSABS 0.1 0.1   ------------------------------------------------------------------------------------------------------------------  Chemistries  Recent Labs  Lab 06/14/20 1516 06/20/20 1432  NA 137 130*  K 3.1* 2.7*  CL 98 98  CO2 21 18*  GLUCOSE 213* 217*  BUN 26 34*  CREATININE 1.57* 1.77*  CALCIUM 9.3 9.0  AST 42* 44*  ALT 29 41  ALKPHOS 271* 282*  BILITOT 0.3 0.6   ------------------------------------------------------------------------------------------------------------------ estimated creatinine clearance is 29.5 mL/min (A) (by C-G formula based on SCr of 1.77 mg/dL (H)). ------------------------------------------------------------------------------------------------------------------ No results for input(s): TSH, T4TOTAL, T3FREE, THYROIDAB in the last 72 hours.  Invalid input(s): FREET3  Coagulation profile No results for input(s): INR, PROTIME in the last 168 hours. ------------------------------------------------------------------------------------------------------------------- No results for input(s): DDIMER in the last 72 hours. -------------------------------------------------------------------------------------------------------------------  Cardiac Enzymes No results for input(s): CKMB, TROPONINI, MYOGLOBIN in the last 168 hours.  Invalid input(s): CK ------------------------------------------------------------------------------------------------------------------ No results found for:  BNP   ---------------------------------------------------------------------------------------------------------------  Urinalysis    Component Value Date/Time   COLORURINE YELLOW 06/20/2020 1740   APPEARANCEUR CLOUDY (A) 06/20/2020 1740   APPEARANCEUR Clear 01/10/2019 1212   LABSPEC 1.015 06/20/2020 1740   PHURINE 5.0 06/20/2020 1740   GLUCOSEU NEGATIVE 06/20/2020 1740   HGBUR NEGATIVE 06/20/2020 1740   BILIRUBINUR NEGATIVE 06/20/2020 1740   BILIRUBINUR Negative 01/10/2019 [place] 06/20/2020 1740   PROTEINUR 30 (A) 06/20/2020 1740   UROBILINOGEN negative 08/20/2015 1607   UROBILINOGEN 1.0 02/24/2012 1821   NITRITE POSITIVE (A) 06/20/2020 1740   LEUKOCYTESUR LARGE (A) 06/20/2020 1740    ----------------------------------------------------------------------------------------------------------------   Imaging Results:    DG Chest 2 View  Result Date: 06/20/2020 CLINICAL DATA:  Chest pain EXAM: CHEST - 2 VIEW COMPARISON:  May 14, 2019 chest radiograph and chest CT May 14, 2019 FINDINGS: There is no evident edema or airspace opacity. The heart size and pulmonary vascularity are normal. No adenopathy. No evident bone lesions. IMPRESSION: Lungs clear.  Cardiac silhouette within normal limits. Electronically Signed   By: Lowella Grip III M.D.   On: 06/20/2020 15:45       Assessment & Plan:    Active Problems:   Bipolar affective disorder in remission [facility])   Essential hypertension   GERD   Amnestic disorder due to medical condition ([place])   AKI (acute kidney injury) ([place])   Hypokalemia   Acute lower UTI   UTI -Patient reported dysuria, she has leukocytosis, urinalysis positive, will follow urine cultures, despite being on oral Cipro as an outpatient she is still symptomatic, evidently her cephalosporin allergy will start on meropenem, hopefully can narrow pending urine cultures.  Multiple fall, generalized deconditioning -Patient with history of  Strick bypass surgery, she has not been taking any bariatric multivitamins, I will check thiamine level, B12 level has been elevated at PCP office recently, no need to repeat, will start on multivitamins. -We will consult PT -Patient appears to be on large dose of gabapentin and Seroquel which I will decrease (gabapentin from 900 mg 3 times daily to 400 mg 3 times daily, Seroquel from 800 mg nightly to 200 mg nightly.  History of bipolar disorder -See above discussion regarding medications, continue with Lamictal at the current dose  AKI with hypokalemia -Volume depletion, continue with IV fluids, repleting potassium given IV magnesium as well - will check renal US  Status post gastric bypass surgery -Was instructed to continue with bariatric multivitamins, thiamine level will be sent during hospital stay, to be followed outpatient by PCP   DVT Prophylaxis Heparin  AM Labs Ordered, also please review Full Orders  Family Communication: Admission, patients condition and plan of care including tests being ordered have been discussed with the patient who indicate understanding and agree with the plan and Code Status.  Code Status Full  Likely DC to  Home  Condition GUARDED    Consults called:  none  Admission status:   obaservation  Time spent in minutes : 50 minutes   Phillips Climes M.D on 06/20/2020 at 7:02 PM   [facility] - Office  [phone]

## 2020-06-20 NOTE — ED Notes (Signed)
CRITICAL VALUE ALERT  Critical Value:  Potassium 2.7  Date & Time Notied:  06/20/20, 1542  Provider Notified: Chilton Si, PA  Orders Received/Actions taken: no new orders

## 2020-06-21 ENCOUNTER — Observation Stay (HOSPITAL_COMMUNITY): Payer: Medicare Other

## 2020-06-21 ENCOUNTER — Telehealth: Payer: Self-pay | Admitting: Family Medicine

## 2020-06-21 DIAGNOSIS — I10 Essential (primary) hypertension: Secondary | ICD-10-CM

## 2020-06-21 DIAGNOSIS — K219 Gastro-esophageal reflux disease without esophagitis: Secondary | ICD-10-CM | POA: Diagnosis not present

## 2020-06-21 DIAGNOSIS — F3170 Bipolar disorder, currently in remission, most recent episode unspecified: Secondary | ICD-10-CM | POA: Diagnosis not present

## 2020-06-21 DIAGNOSIS — N179 Acute kidney failure, unspecified: Secondary | ICD-10-CM | POA: Diagnosis not present

## 2020-06-21 DIAGNOSIS — F04 Amnestic disorder due to known physiological condition: Secondary | ICD-10-CM | POA: Diagnosis not present

## 2020-06-21 LAB — COMPREHENSIVE METABOLIC PANEL
ALT: 37 U/L (ref 0–44)
AST: 37 U/L (ref 15–41)
Albumin: 3 g/dL — ABNORMAL LOW (ref 3.5–5.0)
Alkaline Phosphatase: 225 U/L — ABNORMAL HIGH (ref 38–126)
Anion gap: 10 (ref 5–15)
BUN: 27 mg/dL — ABNORMAL HIGH (ref 8–23)
CO2: 20 mmol/L — ABNORMAL LOW (ref 22–32)
Calcium: 9 mg/dL (ref 8.9–10.3)
Chloride: 104 mmol/L (ref 98–111)
Creatinine, Ser: 1.4 mg/dL — ABNORMAL HIGH (ref 0.44–1.00)
GFR calc Af Amer: 45 mL/min — ABNORMAL LOW (ref >60)
GFR calc non Af Amer: 39 mL/min — ABNORMAL LOW (ref >60)
Glucose, Bld: 169 mg/dL — ABNORMAL HIGH (ref 70–99)
Potassium: 4 mmol/L (ref 3.5–5.1)
Sodium: 134 mmol/L — ABNORMAL LOW (ref 135–145)
Total Bilirubin: 0.7 mg/dL (ref 0.3–1.2)
Total Protein: 6.1 g/dL — ABNORMAL LOW (ref 6.5–8.1)

## 2020-06-21 LAB — CBC
HCT: 42.4 % (ref 36.0–46.0)
Hemoglobin: 13.7 g/dL (ref 12.0–15.0)
MCH: 29.1 pg (ref 26.0–34.0)
MCHC: 32.3 g/dL (ref 30.0–36.0)
MCV: 90 fL (ref 80.0–100.0)
Platelets: 332 10*3/uL (ref 150–400)
RBC: 4.71 MIL/uL (ref 3.87–5.11)
RDW: 12.7 % (ref 11.5–15.5)
WBC: 8.5 10*3/uL (ref 4.0–10.5)
nRBC: 0 % (ref 0.0–0.2)

## 2020-06-21 LAB — HIV ANTIBODY (ROUTINE TESTING W REFLEX): HIV Screen 4th Generation wRfx: NONREACTIVE

## 2020-06-21 MED ORDER — FUROSEMIDE 20 MG PO TABS: 20.0000 mg | ORAL_TABLET | Freq: Every day | ORAL | 0 refills | Status: DC

## 2020-06-21 MED ORDER — NYSTATIN 100000 UNIT/ML MT SUSP: 5.0000 mL | Freq: Four times a day (QID) | OROMUCOSAL | 0 refills | Status: AC

## 2020-06-21 MED ORDER — ADULT MULTIVITAMIN LIQUID CH: 15.0000 mL | Freq: Every day | ORAL | 1 refills | Status: AC

## 2020-06-21 MED ORDER — CIPROFLOXACIN HCL 500 MG PO TABS: 500.0000 mg | ORAL_TABLET | Freq: Two times a day (BID) | ORAL | 0 refills | Status: AC

## 2020-06-21 MED ORDER — NYSTATIN 100000 UNIT/ML MT SUSP
5.0000 mL | Freq: Four times a day (QID) | OROMUCOSAL | Status: DC
  Administered 2020-06-21: 500000 [IU] via ORAL
  Filled 2020-06-21: qty 5

## 2020-06-21 NOTE — Care Management Obs Status (Signed)
MEDICARE OBSERVATION STATUS NOTIFICATION   Patient Details  Name: CHALON ZOBRIST MRN: [id number] Date of Birth: [birth year]   Medicare Observation Status Notification Given:  Yes    Corey Harold 06/21/2020, 1:36 PM

## 2020-06-21 NOTE — Telephone Encounter (Signed)
FYI patient in hospital currently

## 2020-06-21 NOTE — Discharge Instructions (Signed)
Acute Kidney Injury, Adult  Acute kidney injury is a sudden worsening of kidney function. The kidneys are organs that have several jobs. They filter the blood to remove waste products and extra fluid. They also maintain a healthy balance of minerals and hormones in the body, which helps control blood pressure and keep bones strong. With this condition, your kidneys do not do their jobs as well as they should. This condition ranges from mild to severe. Over time it may develop into long-lasting (chronic) kidney disease. Early detection and treatment may prevent acute kidney injury from developing into a chronic condition. What are the causes? Common causes of this condition include:  A problem with blood flow to the kidneys. This may be caused by: ? Low blood pressure (hypotension) or shock. ? Blood loss. ? Heart and blood vessel (cardiovascular) disease. ? Severe burns. ? Liver disease.  Direct damage to the kidneys. This may be caused by: ? Certain medicines. ? A kidney infection. ? Poisoning. ? Being around or in contact with toxic substances. ? A surgical wound. ? A hard, direct hit to the kidney area.  A sudden blockage of urine flow. This may be caused by: ? Cancer. ? Kidney stones. ? An enlarged prostate in males. What are the signs or symptoms? Symptoms of this condition may not be obvious until the condition becomes severe. Symptoms of this condition can include:  Tiredness (lethargy), or difficulty staying awake.  Nausea or vomiting.  Swelling (edema) of the face, legs, ankles, or feet.  Problems with urination, such as: ? Abdominal pain, or pain along the side of your stomach (flank). ? Decreased urine production. ? Decrease in the force of urine flow.  Muscle twitches and cramps, especially in the legs.  Confusion or trouble concentrating.  Loss of appetite.  Fever. How is this diagnosed? This condition may be diagnosed with tests, including:  Blood  tests.  Urine tests.  Imaging tests.  A test in which a sample of tissue is removed from the kidneys to be examined under a microscope (kidney biopsy). How is this treated? Treatment for this condition depends on the cause and how severe the condition is. In mild cases, treatment may not be needed. The kidneys may heal on their own. In more severe cases, treatment will involve:  Treating the cause of the kidney injury. This may involve changing any medicines you are taking or adjusting your dosage.  Fluids. You may need specialized IV fluids to balance your body's needs.  Having a catheter placed to drain urine and prevent blockages.  Preventing problems from occurring. This may mean avoiding certain medicines or procedures that can cause further injury to the kidneys. In some cases treatment may also require:  A procedure to remove toxic wastes from the body (dialysis or continuous renal replacement therapy - CRRT).  Surgery. This may be done to repair a torn kidney, or to remove the blockage from the urinary system. Follow these instructions at home: Medicines  Take over-the-counter and prescription medicines only as told by your health care provider.  Do not take any new medicines without your health care provider's approval. Many medicines can worsen your kidney damage.  Do not take any vitamin and mineral supplements without your health care provider's approval. Many nutritional supplements can worsen your kidney damage. Lifestyle  If your health care provider prescribed changes to your diet, follow them. You may need to decrease the amount of protein you eat.  Achieve and maintain a healthy   weight. If you need help with this, ask your health care provider.  Start or continue an exercise plan. Try to exercise at least 30 minutes a day, 5 days a week.  Do not use any tobacco products, such as cigarettes, chewing tobacco, and e-cigarettes. If you need help quitting, ask your  health care provider. General instructions  Keep track of your blood pressure. Report changes in your blood pressure as told by your health care provider.  Stay up to date with immunizations. Ask your health care provider which immunizations you need.  Keep all follow-up visits as told by your health care provider. This is important. Where to find more information  American Association of Kidney Patients: ResidentialShow.is  [employer]: www.kidney.org  American Kid[employer] Fund: FightingMatch.com.ee  Life Options Rehabilitation Program: ? www.lifeoptions.org ? www.kidneyschool.org Contact a health care provider if:  Your symptoms get worse.  You develop new symptoms. Get help right away if:  You develop symptoms of worsening kidney disease, which include: ? Headaches. ? Abnormally dark or light skin. ? Easy bruising. ? Frequent hiccups. ? Chest pain. ? Shortness of breath. ? End of menstruation in women. ? Seizures. ? Confusion or altered mental status. ? Abdominal or back pain. ? Itchiness.  You have a fever.  Your body is producing less urine.  You have pain or bleeding when you urinate. Summary  Acute kidney injury is a sudden worsening of kidney function.  Acute kidney injury can be caused by problems with blood flow to the kidneys, direct damage to the kidneys, and sudden blockage of urine flow.  Symptoms of this condition may not be obvious until it becomes severe. Symptoms may include edema, lethargy, confusion, nausea or vomiting, and problems passing urine.  This condition can usually be diagnosed with blood tests, urine tests, and imaging tests. Sometimes a kidney biopsy is done to diagnose this condition.  Treatment for this condition often involves treating the underlying cause. It is treated with fluids, medicines, dialysis, diet changes, or surgery. This information is not intended to replace advice given to you by your health care provider. Make  sure you discuss any questions you have with your health care provider. Document Revised: 10/15/2017 Document Reviewed: 10/23/2016 Elsevier Patient Education  2020 Elsevier Inc.   IMPORTANT INFORMATION: PAY CLOSE ATTENTION   PHYSICIAN DISCHARGE INSTRUCTIONS  Follow with Primary care provider  Raliegh Ip, DO  and other consultants as instructed by your Hospitalist Physician  SEEK MEDICAL CARE OR RETURN TO EMERGENCY ROOM IF SYMPTOMS COME BACK, WORSEN OR NEW PROBLEM DEVELOPS   Please note: You were cared for by a hospitalist during your hospital stay. Every effort will be made to forward records to your primary care provider.  You can request that your primary care provider send for your hospital records if they have not received them.  Once you are discharged, your primary care physician will handle any further medical issues. Please note that NO REFILLS for any discharge medications will be authorized once you are discharged, as it is imperative that you return to your primary care physician (or establish a relationship with a primary care physician if you do not have one) for your post hospital discharge needs so that they can reassess your need for medications and monitor your lab values.  Please get a complete blood count and chemistry panel checked by your Primary MD at your next visit, and again as instructed by your Primary MD.  Get Medicines reviewed and adjusted: Please  take all your medications with you for your next visit with your Primary MD  Laboratory/radiological data: Please request your Primary MD to go over all hospital tests and procedure/radiological results at the follow up, please ask your primary care provider to get all Hospital records sent to his/her office.  In some cases, they will be blood work, cultures and biopsy results pending at the time of your discharge. Please request that your primary care provider follow up on these results.  If you are  diabetic, please bring your blood sugar readings with you to your follow up appointment with primary care.    Please call and make your follow up appointments as soon as possible.    Also Note the following: If you experience worsening of your admission symptoms, develop shortness of breath, life threatening emergency, suicidal or homicidal thoughts you must seek medical attention immediately by calling 911 or calling your MD immediately  if symptoms less severe.  You must read complete instructions/literature along with all the possible adverse reactions/side effects for all the Medicines you take and that have been prescribed to you. Take any new Medicines after you have completely understood and accpet all the possible adverse reactions/side effects.   Do not drive when taking Pain medications or sleeping medications (Benzodiazepines)  Do not take more than prescribed Pain, Sleep and Anxiety Medications. It is not advisable to combine anxiety,sleep and pain medications without talking with your primary care practitioner  Special Instructions: If you have smoked or chewed Tobacco  in the last 2 yrs please stop smoking, stop any regular Alcohol  and or any Recreational drug use.  Wear Seat belts while driving.  Do not drive if taking any narcotic, mind altering or controlled substances or recreational drugs or alcohol.

## 2020-06-21 NOTE — Telephone Encounter (Signed)
Pt discharge from hospital today. Needing an appt to follow-up with Dr. Reece Agar first available.

## 2020-06-21 NOTE — Telephone Encounter (Signed)
LM for pt to call back for hosp follow up appt - If we can't get Dr Reece Agar in the next 2 weeks or so, we may need to try for JE. -jhb 8/6

## 2020-06-21 NOTE — Discharge Summary (Addendum)
Physician Discharge Summary  Erin Donaldson WLK:[id number] DOB: July 31, 1951 DOA: 06/20/2020  PCP: Janora Norlander, DO  Admit date: 06/20/2020 Discharge date: 06/21/2020  Admitted From:  Home  Disposition:  Home   Recommendations for Outpatient Follow-up:  1. Follow up with PCP in 1 weeks 2. Please follow up on final urine culture results 3. Please check BMP in 1 week  4. Outpatient PT ordered.  5. Please discuss medication dosages with PCP.   Discharge Condition: STABLE   CODE STATUS: FULL    Brief Hospitalization Summary: Please see all hospital notes, images, labs for full details of the hospitalization. ADMISSION HPI:  Erin Donaldson  is a 69 y.o. female, with past medical history of gastric bypass surgery, bipolar disorder, elevated LFTs, hyperlipidemia, hypertension, insomnia, to ED from [facility] [facility] for abnormal labs, [name] due to AKI, patient reports she had a gastric bypass surgery multiple years ago, with significant weight loss, she does report history of multiple falls in the past few weeks, she has been feeling unsteady, as well she does report dysuria, as well as been darker in color, for which she has been seen by PCP, for which he started her on ciprofloxacin for UTI, as well she reports she just finished doxycycline course for wound MRSA infection, patient was directed by PCP to come to ED for worsening renal function, patient reports poor fluid intake, dizziness, lightheadedness, but she denies any fever or chills. - in ED patient blood pressure was soft, which did respond to fluid bolus, her work-up was significant for chronically elevated alk phos at 282, hypokalemia with potassium of 2.7, hyponatremia with sodium of 130, she had elevated lactic acid, leukocytosis at 12.8, she had positive urine analysis, [facility] consulted to admit   UTI - Pyelonephritis ruled out -Patient presented with dysuria, she had leukocytosis, urinalysis positive for  nitrite, bacteria and leukocytes, urine culture is pending. She was treated with IV meropenem with good results. She feels better and wants to go home, her labs are better.  Renal ultrasound was normal.  Finish 3 more days of cipro.  DC  Home today.  Follow up final urine culture on outpatient visit with PCP.   Multiple fall, generalized deconditioning -Patient with history of Strick bypass surgery, she has not been taking any bariatric multivitamins, I will check thiamine level, B12 level has been elevated at PCP office recently, no need to repeat, will start on multivitamins. -PT recommending outpatient PT which is ordered at discharge.   -Patient appears to be on large dose of gabapentin and Seroquel.  I have asked for her to discuss with PCP if she should continue these high doses.   History of bipolar disorder -See above discussion regarding medications, continue with Lamictal at the current dose  AKI with hypokalemia -Volume depletion, treated with IV fluids, repleted otassium given IV magnesium as well -renal US was normal.   Status post gastric bypass surgery -Was instructed to continue with bariatric multivitamins, thiamine level will be sent during hospital stay, to be followed outpatient by PCP   DVT Prophylaxis Heparin  AM Labs Ordered, also please review Full Orders   Discharge Diagnoses:  Active Problems:   Bipolar affective disorder in remission [facility])   Essential hypertension   GERD   Amnestic disorder due to medical condition ([place])   AKI (acute kidney injury) ([place])   Hypokalemia   Acute lower UTI   Discharge Instructions: Discharge Instructions    Ambulatory referral to Physical Therapy  Complete by: As directed      Allergies as of 06/21/2020      Reactions   Bactrim [sulfamethoxazole-trimethoprim]    Raw and irritatated in mouth.   Darvocet [propoxyphene N-acetaminophen]    Decongestant [oxymetazoline]    Elavil [amitriptyline]    Lithium Other  (See Comments)   Hx toxicity   Risperidone And Related    Ultram [tramadol]    Keflex [cephalexin] Rash      Medication List    TAKE these medications   albuterol 108 (90 Base) MCG/ACT inhaler Commonly known as: VENTOLIN HFA Inhale 2 puffs into the lungs every 6 (six) hours as needed for wheezing or shortness of breath.   cetirizine 10 MG tablet Commonly known as: ZYRTEC Take 1 tablet (10 mg total) by mouth daily.   ciprofloxacin 500 MG tablet Commonly known as: Cipro Take 1 tablet (500 mg total) by mouth 2 (two) times daily for 3 days. Start taking on: June 22, 2020   cyclobenzaprine 10 MG tablet Commonly known as: FLEXERIL Take 0.5-1 tablets (5-10 mg total) by mouth 2 (two) times daily as needed for muscle spasms.   donepezil 10 MG tablet Commonly known as: ARICEPT TAKE 1 TABLET BY MOUTH EVERY DAY Notes to patient: Next dose due tomorrow   DULoxetine 60 MG capsule Commonly known as: CYMBALTA TAKE 1 CAPSULE BY MOUTH EVERY DAY Notes to patient: Next dose due tomorrow   fluticasone 50 MCG/ACT nasal spray Commonly known as: FLONASE SPRAY 2 SPRAYS INTO EACH NOSTRIL EVERY DAY   furosemide 20 MG tablet Commonly known as: LASIX Take 1 tablet (20 mg total) by mouth daily. Start taking on: June 24, 2020 What changed: These instructions start on June 24, 2020. If you are unsure what to do until then, ask your doctor or other care provider.   gabapentin 300 MG capsule Commonly known as: NEURONTIN TAKE 3 CAPSULES BY MOUTH THREE TIMES A DAY What changed: See the new instructions.   lamoTRIgine 200 MG tablet Commonly known as: LAMICTAL TAKE 1 TABLET BY MOUTH EVERY DAY   meclizine 25 MG tablet Commonly known as: ANTIVERT TAKE 1 TABLET (25 MG TOTAL) BY MOUTH 3 (THREE) TIMES DAILY AS NEEDED FOR DIZZINESS.   multivitamin Liqd Take 15 mLs by mouth daily. Start taking on: June 22, 2020   nystatin 100000 UNIT/ML suspension Commonly known as: MYCOSTATIN Take 5 mLs  (500,000 Units total) by mouth 4 (four) times daily for 5 days.   nystatin cream Commonly known as: MYCOSTATIN Apply topically 2 (two) times daily. Notes to patient: Next dose due this evening   pantoprazole 40 MG tablet Commonly known as: PROTONIX TAKE 2 TABLETS BY MOUTH EVERY DAY Notes to patient: Next dose due tomorrow   QUEtiapine 200 MG tablet Commonly known as: SEROQUEL TAKE 1 TABLET BY MOUTH EVERY DAY Notes to patient: Next dose due tomorrow   QUEtiapine 400 MG tablet Commonly known as: SEROQUEL TAKE 2 TABLETS (800 MG TOTAL) BY MOUTH AT BEDTIME.   rizatriptan 10 MG tablet Commonly known as: MAXALT Take 1 tablet (10 mg total) by mouth as needed. May repeat in 2 hours if needed   Vitamin D (Ergocalciferol) 1.25 MG (50000 UNIT) Caps capsule Commonly known as: DRISDOL Take 50,000 Units by mouth 2 (two) times a week.       Follow-up Information    Ronnie Doss M, DO. Schedule an appointment as soon as possible for a visit in 1 week(s).   Specialty: Family Medicine Contact information: [phone]  [place] [address] [phone]              Allergies  Allergen Reactions  . Bactrim [Sulfamethoxazole-Trimethoprim]     Raw and irritatated in mouth.  Carlton Adam [Propoxyphene N-Acetaminophen]   . Decongestant [Oxymetazoline]   . Elavil [Amitriptyline]   . Lithium Other (See Comments)    Hx toxicity  . Risperidone And Related   . Ultram [Tramadol]   . Keflex [Cephalexin] Rash   Allergies as of 06/21/2020      Reactions   Bactrim [sulfamethoxazole-trimethoprim]    Raw and irritatated in mouth.   Darvocet [propoxyphene N-acetaminophen]    Decongestant [oxymetazoline]    Elavil [amitriptyline]    Lithium Other (See Comments)   Hx toxicity   Risperidone And Related    Ultram [tramadol]    Keflex [cephalexin] Rash      Medication List    TAKE these medications   albuterol 108 (90 Base) MCG/ACT inhaler Commonly known as: VENTOLIN HFA Inhale 2  puffs into the lungs every 6 (six) hours as needed for wheezing or shortness of breath.   cetirizine 10 MG tablet Commonly known as: ZYRTEC Take 1 tablet (10 mg total) by mouth daily.   ciprofloxacin 500 MG tablet Commonly known as: Cipro Take 1 tablet (500 mg total) by mouth 2 (two) times daily for 3 days. Start taking on: June 22, 2020   cyclobenzaprine 10 MG tablet Commonly known as: FLEXERIL Take 0.5-1 tablets (5-10 mg total) by mouth 2 (two) times daily as needed for muscle spasms.   donepezil 10 MG tablet Commonly known as: ARICEPT TAKE 1 TABLET BY MOUTH EVERY DAY Notes to patient: Next dose due tomorrow   DULoxetine 60 MG capsule Commonly known as: CYMBALTA TAKE 1 CAPSULE BY MOUTH EVERY DAY Notes to patient: Next dose due tomorrow   fluticasone 50 MCG/ACT nasal spray Commonly known as: FLONASE SPRAY 2 SPRAYS INTO EACH NOSTRIL EVERY DAY   furosemide 20 MG tablet Commonly known as: LASIX Take 1 tablet (20 mg total) by mouth daily. Start taking on: June 24, 2020 What changed: These instructions start on June 24, 2020. If you are unsure what to do until then, ask your doctor or other care provider.   gabapentin 300 MG capsule Commonly known as: NEURONTIN TAKE 3 CAPSULES BY MOUTH THREE TIMES A DAY What changed: See the new instructions.   lamoTRIgine 200 MG tablet Commonly known as: LAMICTAL TAKE 1 TABLET BY MOUTH EVERY DAY   meclizine 25 MG tablet Commonly known as: ANTIVERT TAKE 1 TABLET (25 MG TOTAL) BY MOUTH 3 (THREE) TIMES DAILY AS NEEDED FOR DIZZINESS.   multivitamin Liqd Take 15 mLs by mouth daily. Start taking on: June 22, 2020   nystatin 100000 UNIT/ML suspension Commonly known as: MYCOSTATIN Take 5 mLs (500,000 Units total) by mouth 4 (four) times daily for 5 days.   nystatin cream Commonly known as: MYCOSTATIN Apply topically 2 (two) times daily. Notes to patient: Next dose due this evening   pantoprazole 40 MG tablet Commonly known as:  PROTONIX TAKE 2 TABLETS BY MOUTH EVERY DAY Notes to patient: Next dose due tomorrow   QUEtiapine 200 MG tablet Commonly known as: SEROQUEL TAKE 1 TABLET BY MOUTH EVERY DAY Notes to patient: Next dose due tomorrow   QUEtiapine 400 MG tablet Commonly known as: SEROQUEL TAKE 2 TABLETS (800 MG TOTAL) BY MOUTH AT BEDTIME.   rizatriptan 10 MG tablet Commonly known as: MAXALT Take 1 tablet (10 mg  total) by mouth as needed. May repeat in 2 hours if needed   Vitamin D (Ergocalciferol) 1.25 MG (50000 UNIT) Caps capsule Commonly known as: DRISDOL Take 50,000 Units by mouth 2 (two) times a week.      Procedures/Studies: DG Chest 2 View  Result Date: 06/20/2020 CLINICAL DATA:  Chest pain EXAM: CHEST - 2 VIEW COMPARISON:  May 14, 2019 chest radiograph and chest CT May 14, 2019 FINDINGS: There is no evident edema or airspace opacity. The heart size and pulmonary vascularity are normal. No adenopathy. No evident bone lesions. IMPRESSION: Lungs clear.  Cardiac silhouette within normal limits. Electronically Signed   By: Lowella Grip III M.D.   On: 06/20/2020 15:45   US RENAL  Result Date: 06/21/2020 CLINICAL DATA:  69 year old female with acute renal injury. Dysuria and leukocytosis. EXAM: RENAL / URINARY TRACT ULTRASOUND COMPLETE COMPARISON:  Abdomen ultrasound 08/31/2018 and earlier. FINDINGS: Right Kidney: Renal measurements: 9.8 x 4.5 x 5.1 cm = volume: 119 mL . Echogenicity within normal limits. Stable renal cortical thickness since 2019. No mass or hydronephrosis visualized. Left Kidney: Renal measurements: 9.6 x 4.8 x 4.6 cm = volume: 110 mL. Cortical echogenicity and cortical thickness appear stable since 2019. Small chronic benign upper pole cyst is stable measuring about 2 cm (image 25). No hydronephrosis or solid mass. Bladder: Appears normal for degree of bladder distention.  No urinary debris. Other: None. IMPRESSION: Stable, unremarkable ultrasound appearance of the bilateral kidneys  and urinary bladder. Electronically Signed   By: Genevie Ann M.D.   On: 06/21/2020 09:38     Subjective: Pt says she feels much better and she wants to go home.    Discharge Exam: Vitals:   06/21/20 0129 06/21/20 0223  BP: 126/71 (!) 152/81  Pulse: 97 98  Resp: 18 16  Temp:  97.8 F (36.6 C)  SpO2: 97% 100%   Vitals:   06/20/20 1830 06/20/20 1900 06/21/20 0129 06/21/20 0223  BP: (!) 143/86 (!) 143/79 126/71 (!) 152/81  Pulse: (!) 107 (!) 107 97 98  Resp: '20 20 18 16  ' Temp:    97.8 F (36.6 C)  TempSrc:      SpO2: 94% 99% 97% 100%  Weight:      Height:       General: Pt is alert, awake, not in acute distress Cardiovascular: normal S1/S2 +, no rubs, no gallops Respiratory: CTA bilaterally, no wheezing, no rhonchi Abdominal: Soft, NT, ND, bowel sounds + Extremities: no edema, no cyanosis   The results of significant diagnostics from this hospitalization (including imaging, microbiology, ancillary and laboratory) are listed below for reference.     Microbiology: Recent Results (from the past 240 hour(s))  Culture, blood (routine x 2)     Status: None (Preliminary result)   Collection Time: 06/20/20  2:32 PM   Specimen: Right Antecubital; Blood  Result Value Ref Range Status   Specimen Description RIGHT ANTECUBITAL  Final   Special Requests   Final    BOTTLES DRAWN AEROBIC AND ANAEROBIC Blood Culture adequate volume Performed at [facility], [address], [place], [place] [address]    Culture PENDING  Incomplete   Report Status PENDING  Incomplete  Culture, blood (routine x 2)     Status: None (Preliminary result)   Collection Time: 06/20/20  2:32 PM   Specimen: BLOOD LEFT FOREARM  Result Value Ref Range Status   Specimen Description BLOOD LEFT FOREARM  Final   Special Requests   Final  BOTTLES DRAWN AEROBIC AND ANAEROBIC Blood Culture adequate volume Performed at [facility], [address] [place], [place] [address]    Culture PENDING  Incomplete   Report  Status PENDING  Incomplete  SARS Coronavirus 2 by RT PCR (hospital order, performed in [facility] hospital lab) Nasopharyngeal Nasopharyngeal Swab     Status: None   Collection Time: 06/20/20  4:10 PM   Specimen: Nasopharyngeal Swab  Result Value Ref Range Status   SARS Coronavirus 2 NEGATIVE NEGATIVE Final    Comment: (NOTE) SARS-CoV-2 target nucleic acids are NOT DETECTED.  The SARS-CoV-2 RNA is generally detectable in upper and lower respiratory specimens during the acute phase of infection. The lowest concentration of SARS-CoV-2 viral copies this assay can detect is 250 copies / mL. A negative result does not preclude SARS-CoV-2 infection and should not be used as the sole basis for treatment or other patient management decisions.  A negative result may occur with improper specimen collection / handling, submission of specimen other than nasopharyngeal swab, presence of viral mutation(s) within the areas targeted by this assay, and inadequate number of viral copies (<250 copies / mL). A negative result must be combined with clinical observations, patient history, and epidemiological information.  Fact Sheet for Patients:   StrictlyIdeas.no  Fact Sheet for Healthcare Providers: BankingDealers.co.za  This test is not yet approved or  cleared by the [place] FDA and has been authorized for detection and/or diagnosis of SARS-CoV-2 by FDA under an Emergency Use Authorization (EUA).  This EUA will remain in effect (meaning this test can be used) for the duration of the COVID-19 declaration under Section 564(b)(1) of the Act, 21 U.S.C. section 360bbb-3(b)(1), unless the authorization is terminated or revoked sooner.  Performed at [facility], [address], [place], [place] [address]      Labs: BNP (last 3 results) No results for input(s): BNP in the last 8760 hours. Basic Metabolic Panel: Recent Labs  Lab 06/14/20 1516  06/20/20 1432 06/20/20 1937 06/21/20 0718  NA 137 130* 132* 134*  K 3.1* 2.7* 3.3* 4.0  CL 98 98 104 104  CO2 21 18* 17* 20*  GLUCOSE 213* 217* 211* 169*  BUN 26 34* 29* 27*  CREATININE 1.57* 1.77* 1.45* 1.40*  CALCIUM 9.3 9.0 8.4* 9.0  PHOS  --   --  3.3  --    Liver Function Tests: Recent Labs  Lab 06/14/20 1516 06/20/20 1432 06/20/20 1937 06/21/20 0718  AST 42* 44*  --  37  ALT 29 41  --  37  ALKPHOS 271* 282*  --  225*  BILITOT 0.3 0.6  --  0.7  PROT 6.0 6.9  --  6.1*  ALBUMIN 3.5* 3.4* 2.9* 3.0*   No results for input(s): LIPASE, AMYLASE in the last 168 hours. No results for input(s): AMMONIA in the last 168 hours. CBC: Recent Labs  Lab 06/14/20 1516 06/20/20 1432 06/21/20 0718  WBC 15.4* 12.8* 8.5  NEUTROABS 12.5* 10.5*  --   HGB 14.2 14.5 13.7  HCT 41.6 42.8 42.4  MCV 89 88.2 90.0  PLT 396 303 332   Cardiac Enzymes: Recent Labs  Lab 06/20/20 1432  CKTOTAL 60   BNP: Invalid input(s): POCBNP CBG: No results for input(s): GLUCAP in the last 168 hours. D-Dimer No results for input(s): DDIMER in the last 72 hours. Hgb A1c No results for input(s): HGBA1C in the last 72 hours. Lipid Profile No results for input(s): CHOL, HDL, LDLCALC, TRIG, CHOLHDL, LDLDIRECT in  the last 72 hours. Thyroid function studies Recent Labs    06/20/20 1937  TSH 1.199   Anemia work up Recent Labs    06/20/20 1937  FOLATE 15.9   Urinalysis    Component Value Date/Time   COLORURINE YELLOW 06/20/2020 1740   APPEARANCEUR CLOUDY (A) 06/20/2020 1740   APPEARANCEUR Clear 01/10/2019 1212   LABSPEC 1.015 06/20/2020 1740   PHURINE 5.0 06/20/2020 1740   GLUCOSEU NEGATIVE 06/20/2020 1740   HGBUR NEGATIVE 06/20/2020 1740   BILIRUBINUR NEGATIVE 06/20/2020 1740   BILIRUBINUR Negative 01/10/2019 [place] 06/20/2020 1740   PROTEINUR 30 (A) 06/20/2020 1740   UROBILINOGEN negative 08/20/2015 1607   UROBILINOGEN 1.0 02/24/2012 1821   NITRITE POSITIVE (A)  06/20/2020 1740   LEUKOCYTESUR LARGE (A) 06/20/2020 1740   Sepsis Labs Invalid input(s): PROCALCITONIN,  WBC,  LACTICIDVEN Microbiology Recent Results (from the past 240 hour(s))  Culture, blood (routine x 2)     Status: None (Preliminary result)   Collection Time: 06/20/20  2:32 PM   Specimen: Right Antecubital; Blood  Result Value Ref Range Status   Specimen Description RIGHT ANTECUBITAL  Final   Special Requests   Final    BOTTLES DRAWN AEROBIC AND ANAEROBIC Blood Culture adequate volume Performed at [facility], [address] [place], [place] [address]    Culture PENDING  Incomplete   Report Status PENDING  Incomplete  Culture, blood (routine x 2)     Status: None (Preliminary result)   Collection Time: 06/20/20  2:32 PM   Specimen: BLOOD LEFT FOREARM  Result Value Ref Range Status   Specimen Description BLOOD LEFT FOREARM  Final   Special Requests   Final    BOTTLES DRAWN AEROBIC AND ANAEROBIC Blood Culture adequate volume Performed at [facility], [address], [place],  [address]    Culture PENDING  Incomplete   Report Status PENDING  Incomplete  SARS Coronavirus 2 by RT PCR (hospital order, performed in [facility] hospital lab) Nasopharyngeal Nasopharyngeal Swab     Status: None   Collection Time: 06/20/20  4:10 PM   Specimen: Nasopharyngeal Swab  Result Value Ref Range Status   SARS Coronavirus 2 NEGATIVE NEGATIVE Final    Comment: (NOTE) SARS-CoV-2 target nucleic acids are NOT DETECTED.  The SARS-CoV-2 RNA is generally detectable in upper and lower respiratory specimens during the acute phase of infection. The lowest concentration of SARS-CoV-2 viral copies this assay can detect is 250 copies / mL. A negative result does not preclude SARS-CoV-2 infection and should not be used as the sole basis for treatment or other patient management decisions.  A negative result may occur with improper specimen collection / handling, submission of specimen  other than nasopharyngeal swab, presence of viral mutation(s) within the areas targeted by this assay, and inadequate number of viral copies (<250 copies / mL). A negative result must be combined with clinical observations, patient history, and epidemiological information.  Fact Sheet for Patients:   StrictlyIdeas.no  Fact Sheet for Healthcare Providers: BankingDealers.co.za  This test is not yet approved or  cleared by the [place] FDA and has been authorized for detection and/or diagnosis of SARS-CoV-2 by FDA under an Emergency Use Authorization (EUA).  This EUA will remain in effect (meaning this test can be used) for the duration of the COVID-19 declaration under Section 564(b)(1) of the Act, 21 U.S.C. section 360bbb-3(b)(1), unless the authorization is terminated or revoked sooner.  Performed at [facility], [address]  [place], [place] [address]    Time coordinating discharge:   SIGNED:  Irwin Brakeman, MD  [facility] 06/21/2020, 2:32 PM How to contact the [facility] Attending or Consulting provider [place] or covering provider during after hours [place], for this patient?  1. Check the care team in [facility] and look for a) attending/consulting [facility] provider listed and b) the [facility] team listed 2. Log into [email or web address] and use [place]'s universal password to access. If you do not have the password, please contact the hospital operator. 3. Locate the [facility] provider you are looking for under [facility] and page to a number that you can be directly reached. 4. If you still have difficulty reaching the provider, please page the [facility] (Director on Call) for the Hospitalists listed on amion for assistance.

## 2020-06-21 NOTE — Evaluation (Signed)
Physical Therapy Evaluation Patient Details Name: Erin Donaldson MRN: [id number] DOB: Aug 30, 1951 Today's Date: 06/21/2020   History of Present Illness  Erin Donaldson  is a 69 y.o. female, with past medical history of gastric bypass surgery, bipolar disorder, elevated LFTs, hyperlipidemia, hypertension, insomnia, to ED from [facility] for abnormal labs, Erin Donaldson due to AKI, patient reports she had a gastric bypass surgery multiple years ago, with significant weight loss, she does report history of multiple falls in the past few weeks, she has been feeling unsteady, as well she does report dysuria, as well as been darker in color, for which she has been seen by PCP, for which he started her on ciprofloxacin for UTI, as well she reports she just finished doxycycline course for wound MRSA infection, patient was directed by PCP to come to ED for worsening renal function, patient reports poor fluid intake, dizziness, lightheadedness, but she denies any fever or chills.    Clinical Impression  Patient limited for functional mobility as stated below secondary to BLE weakness, impaired balance and recent falls. Patient does not require assist with mobility today but does demonstrate some unsteadiness upon initial standing and with ambulation. Patient states she has been feeling weak and has had issues with balance/unsteadiness which has been increasing recently with several falls in the last few weeks. Patient agreeable to attend outpatient PT for improving her strength and balance and she would benefit to reduce the risk of falls. Patient does not require additional PT services while in hospital. Patient discharged to care of nursing for ambulation daily as tolerated for length of stay.     Follow Up Recommendations Outpatient PT    Equipment Recommendations  None recommended by PT    Recommendations for Other Services       Precautions / Restrictions Precautions Precautions:  Fall Restrictions Weight Bearing Restrictions: No      Mobility  Bed Mobility Overal bed mobility: Independent             General bed mobility comments: HOB elevated  Transfers Overall transfer level: Modified independent Equipment used: Straight cane             General transfer comment: transfer to standing with SPC, min unsteadiness upon standing  Ambulation/Gait Ambulation/Gait assistance: Modified independent (Device/Increase time) Gait Distance (Feet): 200 Feet Assistive device: Straight cane Gait Pattern/deviations: Step-through pattern Gait velocity: decreased   General Gait Details: slightly slow, labored cadence with use of SPC; no loss of balance  Stairs            Wheelchair Mobility    Modified Rankin (Stroke Patients Only)       Balance Overall balance assessment: Needs assistance Sitting-balance support: No upper extremity supported Sitting balance-Leahy Scale: Normal Sitting balance - Comments: seated EOB   Standing balance support: Single extremity supported Standing balance-Leahy Scale: Good Standing balance comment: with SPC                             Pertinent Vitals/Pain Pain Assessment: No/denies pain    Home Living Family/patient expects to be discharged to:: Private residence Living Arrangements: Children Available Help at Discharge: Family;Available PRN/intermittently Type of Home: House Home Access: Stairs to enter Entrance Stairs-Rails: None Entrance Stairs-Number of Steps: 1-2 Home Layout: One level Home Equipment: Cane - single point      Prior Function Level of Independence: Independent with assistive device(s)  Comments: Patient states independent with ADL, community ambulator, uses [facility], takes care of 7 y.o. child and 2 dogs, drives, several falls recently     Hand Dominance        Extremity/Trunk Assessment   Upper Extremity Assessment Upper Extremity Assessment: Overall  WFL for tasks assessed    Lower Extremity Assessment Lower Extremity Assessment: Overall WFL for tasks assessed    Cervical / Trunk Assessment Cervical / Trunk Assessment: Normal  Communication   Communication: No difficulties  Cognition Arousal/Alertness: Awake/alert Behavior During Therapy: WFL for tasks assessed/performed Overall Cognitive Status: Within Functional Limits for tasks assessed                                        General Comments      Exercises     Assessment/Plan    PT Assessment All further PT needs can be met in the next venue of care  PT Problem List Decreased strength;Decreased activity tolerance;Decreased balance;Decreased mobility       PT Treatment Interventions      PT Goals (Current goals can be found in the Care Plan section)  Acute Rehab PT Goals Patient Stated Goal: Return home PT Goal Formulation: With patient Time For Goal Achievement: 06/21/20 Potential to Achieve Goals: Good    Frequency     Barriers to discharge        Co-evaluation               AM-PAC PT "6 Clicks" Mobility  Outcome Measure Help needed turning from your back to your side while in a flat bed without using bedrails?: None Help needed moving from lying on your back to sitting on the side of a flat bed without using bedrails?: None Help needed moving to and from a bed to a chair (including a wheelchair)?: None Help needed standing up from a chair using your arms (e.g., wheelchair or bedside chair)?: None Help needed to walk in hospital room?: A Little Help needed climbing 3-5 steps with a railing? : A Little 6 Click Score: 22    End of Session Equipment Utilized During Treatment: Gait belt Activity Tolerance: Patient tolerated treatment well Patient left: in bed;with call bell/phone within reach Nurse Communication: Mobility status PT Visit Diagnosis: Unsteadiness on feet (R26.81);Other abnormalities of gait and mobility  (R26.89);Repeated falls (R29.6);Muscle weakness (generalized) (M62.81)    Time: 2130-8657 PT Time Calculation (min) (ACUTE ONLY): 20 min   Charges:   PT Evaluation $PT Eval Low Complexity: 1 Low PT Treatments $Therapeutic Activity: 8-22 mins       11:11 AM, 06/21/20 Erin Donaldson PT, DPT Physical Therapist at [facility]

## 2020-06-21 NOTE — Progress Notes (Signed)
NURSING PROGRESS NOTE  GAURI GALVAO [id number] Discharge Data: 06/21/2020 3:04 PM Attending Provider: Cleora Fleet, MD [id number], Rozell Searing, DO     [name] to be D/C'd Home per MD order.  Discussed with the patient the After Visit Summary and all questions fully answered. All IV's discontinued with no bleeding noted. All belongings returned to patient for patient to take home.   Last Vital Signs:  Blood pressure 133/71, pulse 100, temperature 97.7 F (36.5 C), resp. rate 19, height 5\' 4"  (1.626 m), weight 67.1 kg, SpO2 98 %.  Discharge Medication List Allergies as of 06/21/2020      Reactions   Bactrim [sulfamethoxazole-trimethoprim]    Raw and irritatated in mouth.   Darvocet [propoxyphene N-acetaminophen]    Decongestant [oxymetazoline]    Elavil [amitriptyline]    Lithium Other (See Comments)   Hx toxicity   Risperidone And Related    Ultram [tramadol]    Keflex [cephalexin] Rash      Medication List    TAKE these medications   albuterol 108 (90 Base) MCG/ACT inhaler Commonly known as: VENTOLIN HFA Inhale 2 puffs into the lungs every 6 (six) hours as needed for wheezing or shortness of breath.   cetirizine 10 MG tablet Commonly known as: ZYRTEC Take 1 tablet (10 mg total) by mouth daily.   ciprofloxacin 500 MG tablet Commonly known as: Cipro Take 1 tablet (500 mg total) by mouth 2 (two) times daily for 3 days. Start taking on: June 22, 2020   cyclobenzaprine 10 MG tablet Commonly known as: FLEXERIL Take 0.5-1 tablets (5-10 mg total) by mouth 2 (two) times daily as needed for muscle spasms.   donepezil 10 MG tablet Commonly known as: ARICEPT TAKE 1 TABLET BY MOUTH EVERY DAY Notes to patient: Next dose due tomorrow   DULoxetine 60 MG capsule Commonly known as: CYMBALTA TAKE 1 CAPSULE BY MOUTH EVERY DAY Notes to patient: Next dose due tomorrow   fluticasone 50 MCG/ACT nasal spray Commonly known as: FLONASE SPRAY 2 SPRAYS INTO EACH NOSTRIL  EVERY DAY   furosemide 20 MG tablet Commonly known as: LASIX Take 1 tablet (20 mg total) by mouth daily. Start taking on: June 24, 2020 What changed: These instructions start on June 24, 2020. If you are unsure what to do until then, ask your doctor or other care provider.   gabapentin 300 MG capsule Commonly known as: NEURONTIN TAKE 3 CAPSULES BY MOUTH THREE TIMES A DAY What changed: See the new instructions.   lamoTRIgine 200 MG tablet Commonly known as: LAMICTAL TAKE 1 TABLET BY MOUTH EVERY DAY   meclizine 25 MG tablet Commonly known as: ANTIVERT TAKE 1 TABLET (25 MG TOTAL) BY MOUTH 3 (THREE) TIMES DAILY AS NEEDED FOR DIZZINESS.   multivitamin Liqd Take 15 mLs by mouth daily. Start taking on: June 22, 2020   nystatin 100000 UNIT/ML suspension Commonly known as: MYCOSTATIN Take 5 mLs (500,000 Units total) by mouth 4 (four) times daily for 5 days.   nystatin cream Commonly known as: MYCOSTATIN Apply topically 2 (two) times daily. Notes to patient: Next dose due this evening   pantoprazole 40 MG tablet Commonly known as: PROTONIX TAKE 2 TABLETS BY MOUTH EVERY DAY Notes to patient: Next dose due tomorrow   QUEtiapine 200 MG tablet Commonly known as: SEROQUEL TAKE 1 TABLET BY MOUTH EVERY DAY Notes to patient: Next dose due tomorrow   QUEtiapine 400 MG tablet Commonly known as: SEROQUEL TAKE 2 TABLETS (800 MG TOTAL)  BY MOUTH AT BEDTIME.   rizatriptan 10 MG tablet Commonly known as: MAXALT Take 1 tablet (10 mg total) by mouth as needed. May repeat in 2 hours if needed   Vitamin D (Ergocalciferol) 1.25 MG (50000 UNIT) Caps capsule Commonly known as: DRISDOL Take 50,000 Units by mouth 2 (two) times a week.        Roma Kayser, RN

## 2020-06-22 ENCOUNTER — Other Ambulatory Visit: Payer: Self-pay | Admitting: Family Medicine

## 2020-06-22 LAB — URINE CULTURE: Culture: >=100000 — AB

## 2020-06-22 MED ORDER — CEFDINIR 300 MG PO CAPS
600.0000 mg | ORAL_CAPSULE | Freq: Every day | ORAL | 0 refills | Status: AC
Start: 2020-06-22 — End: 2020-06-25

## 2020-06-22 NOTE — Progress Notes (Signed)
Pt treated with meropenem in hospital.  Phone call to patient to stop cipro and called in new Rx for omnicef for UTI.  Follow up with PCP.  Maryln Manuel MD

## 2020-06-22 NOTE — Progress Notes (Signed)
Pt called about new Rx and will pick up today.    Maryln Manuel MD

## 2020-06-24 NOTE — Telephone Encounter (Signed)
Appt made

## 2020-06-25 ENCOUNTER — Telehealth: Payer: Self-pay | Admitting: Family Medicine

## 2020-06-25 ENCOUNTER — Emergency Department (HOSPITAL_COMMUNITY)
Admission: EM | Admit: 2020-06-25 | Discharge: 2020-06-25 | Disposition: A | Discharge status: Home / Self Care | Payer: Medicare Other | Attending: Emergency Medicine | Admitting: Emergency Medicine

## 2020-06-25 ENCOUNTER — Other Ambulatory Visit: Payer: Self-pay

## 2020-06-25 ENCOUNTER — Encounter (HOSPITAL_COMMUNITY): Payer: Self-pay | Admitting: Emergency Medicine

## 2020-06-25 DIAGNOSIS — W19XXXA Unspecified fall, initial encounter: Secondary | ICD-10-CM

## 2020-06-25 DIAGNOSIS — Z5321 Procedure and treatment not carried out due to patient leaving prior to being seen by health care provider: Secondary | ICD-10-CM | POA: Diagnosis not present

## 2020-06-25 DIAGNOSIS — M7989 Other specified soft tissue disorders: Secondary | ICD-10-CM | POA: Diagnosis present

## 2020-06-25 DIAGNOSIS — R531 Weakness: Secondary | ICD-10-CM

## 2020-06-25 LAB — CULTURE, BLOOD (ROUTINE X 2)
Culture: NO GROWTH
Culture: NO GROWTH
Special Requests: ADEQUATE
Special Requests: ADEQUATE

## 2020-06-25 LAB — VITAMIN B1: Vitamin B1 (Thiamine): 158.3 nmol/L (ref 66.5–200.0)

## 2020-06-25 NOTE — ED Notes (Signed)
Pt was called 3 times to be roomed.

## 2020-06-25 NOTE — Telephone Encounter (Signed)
Pt states her feet are swollen and she fell a few times last night but didn't pass out. She states she just lost her balance, weak and slightly dizzy. She went back to the ED today but was told it would be a 3 hour wait so she left. Pt states her BP reading was 101/70 at the ED today. Pt wants to know if she will be ok to just take her lasix and keep her feet elevated and see if that helps? We don't have any openings for in person the rest of this week. Please advise?

## 2020-06-25 NOTE — ED Triage Notes (Signed)
Pt c/o of multiple falls (4) last night. She has noticeable swelling to the feet and ankles -right worse than left. Denies pain.

## 2020-06-25 NOTE — Telephone Encounter (Signed)
Left message to call back  

## 2020-06-25 NOTE — Telephone Encounter (Signed)
I worry about her BP being too soft for ongoing lasix use.  There is a 955 with stacks open tomorrow.  I just blocked it.  Can she come then?

## 2020-06-25 NOTE — Telephone Encounter (Signed)
Pt is scheduled with Dr. Darlyn Read 06/26/20 at 9:55

## 2020-06-26 ENCOUNTER — Encounter: Payer: Self-pay | Admitting: Family Medicine

## 2020-06-26 ENCOUNTER — Telehealth: Payer: Self-pay | Admitting: Family Medicine

## 2020-06-26 ENCOUNTER — Ambulatory Visit (INDEPENDENT_AMBULATORY_CARE_PROVIDER_SITE_OTHER): Payer: Medicare Other | Admitting: Family Medicine

## 2020-06-26 VITALS — BP 119/78 | HR 102 | Temp 97.2°F | Ht 64.0 in | Wt 152.0 lb

## 2020-06-26 DIAGNOSIS — R600 Localized edema: Secondary | ICD-10-CM | POA: Diagnosis not present

## 2020-06-26 DIAGNOSIS — R2689 Other abnormalities of gait and mobility: Secondary | ICD-10-CM | POA: Diagnosis not present

## 2020-06-26 DIAGNOSIS — E878 Other disorders of electrolyte and fluid balance, not elsewhere classified: Secondary | ICD-10-CM

## 2020-06-26 DIAGNOSIS — S098XXA Other specified injuries of head, initial encounter: Secondary | ICD-10-CM | POA: Diagnosis not present

## 2020-06-26 LAB — URINALYSIS, COMPLETE
Bilirubin, UA: NEGATIVE
Glucose, UA: NEGATIVE
Ketones, UA: NEGATIVE
Nitrite, UA: NEGATIVE
Protein,UA: NEGATIVE
Specific Gravity, UA: 1.015 (ref 1.005–1.030)
Urobilinogen, Ur: 0.2 mg/dL (ref 0.2–1.0)
pH, UA: 5.5 (ref 5.0–7.5)

## 2020-06-26 LAB — MICROSCOPIC EXAMINATION
Bacteria, UA: NONE SEEN
RBC, Urine: NONE SEEN /hpf (ref 0–2)

## 2020-06-26 MED ORDER — AMOXICILLIN 500 MG PO CAPS: 500.0000 mg | ORAL_CAPSULE | Freq: Three times a day (TID) | ORAL | 0 refills | Status: DC

## 2020-06-26 NOTE — Progress Notes (Signed)
Subjective:  Patient ID: Erin Donaldson, female    DOB: 1951-08-07  Age: 69 y.o. MRN: [id number]  CC: Edema (Bilateral, Right Worse) and Fall (Multiple (4) )   HPI Erin Donaldson presents for swelling and loss of balance.  On August 5 she was admitted for an overnight stay to [facility] due to being on the verge of sepsis.  Source was felt to be her kidney/urine and she had an ultrasound of the kidney that showed some wall thickening.  She just finished a course of Cipro yesterday for this.  2 days ago she had a fall in the evening.  She hit the back of her head.  She reports that she had poor balance at the time and felt weak.  She denies loss of consciousness.  Subsequently she fell 3 more times in the house.  No more head injury and again no loss of consciousness.  She continues to feel weak.  Yesterday she noted that her legs were swelling.  She says they never swell like this.  She has been taking Lasix for swelling.  While hospitalized she did have some electrolyte imbalance.  She notes that she had 4 bags of IV fluids during her overnight stay.  She felt better for a day or so but the weakness started coming back quickly.  Depression screen [facility] 2/9 06/26/2020 06/14/2020 06/14/2020  Decreased Interest 1 1 0  Down, Depressed, Hopeless 1 1 0  PHQ - 2 Score 2 2 0  Altered sleeping 0 0 -  Tired, decreased energy 1 2 -  Change in appetite 0 0 -  Feeling bad or failure about yourself  1 1 -  Trouble concentrating 2 3 -  Moving slowly or fidgety/restless 0 0 -  Suicidal thoughts 0 0 -  PHQ-9 Score 6 8 -  Difficult doing work/chores Somewhat difficult Not difficult at all -  Some recent data might be hidden    History Shara has a past medical history of Anemia, Anxiety, Bipolar affective ([place]), Depression, Diabetes in pregnancy, Diabetes mellitus, Elevated LFTs, GERD (gastroesophageal reflux disease), Hyperlipidemia, Hypertension, IBS (irritable bowel syndrome), and Migraine.   She  has a past surgical history that includes Cholecystectomy and Gastric bypass (2005).   Her family history includes Alcohol abuse in her brother; Breast cancer in her mother; CAD in her father; Cancer in her mother; Dementia in her brother; Depression in her sister; Diabetes in her daughter, father, maternal aunt, maternal uncle, paternal aunt, and paternal uncle; Drug abuse in her brother and daughter; Heart attack in her maternal grandfather; Heart disease in her brother, maternal aunt, maternal grandfather, maternal uncle, paternal aunt, paternal grandfather, and paternal uncle; Heart disease (age of onset: 4) in her mother; Heart disease (age of onset: 30) in her father; Hypertension in her sister; Kidney disease in her father; Other in her brother; Stroke in her maternal grandfather and maternal grandmother.She reports that she has never smoked. She has never used smokeless tobacco. She reports current drug use. Drug: Mescaline. She reports that she does not drink alcohol.    ROS Review of Systems  Constitutional: Negative.   HENT: Negative for congestion.   Eyes: Negative for visual disturbance.  Respiratory: Negative for shortness of breath.   Cardiovascular: Negative for chest pain.  Gastrointestinal: Negative for abdominal pain, constipation, diarrhea, nausea and vomiting.  Genitourinary: Negative for difficulty urinating.  Musculoskeletal: Positive for gait problem (poor balance). Negative for arthralgias and myalgias.  Neurological: Positive for  weakness. Negative for syncope and headaches.  Psychiatric/Behavioral: Negative for sleep disturbance.    Objective:  BP 119/78   Pulse (!) 102   Temp (!) 97.2 F (36.2 C) (Temporal)   Ht '5\' 4"'  (1.626 m)   Wt 152 lb (68.9 kg)   BMI 26.09 kg/m   BP Readings from Last 3 Encounters:  06/26/20 119/78  06/25/20 101/70  06/21/20 133/71    Wt Readings from Last 3 Encounters:  06/26/20 152 lb (68.9 kg)  06/25/20 151 lb (68.5 kg)    06/20/20 148 lb (67.1 kg)     Physical Exam Constitutional:      General: She is not in acute distress.    Appearance: She is well-developed.  HENT:     Head: Normocephalic and atraumatic.  Eyes:     Conjunctiva/sclera: Conjunctivae normal.     Pupils: Pupils are equal, round, and reactive to light.  Neck:     Thyroid: No thyromegaly.  Cardiovascular:     Rate and Rhythm: Normal rate and regular rhythm.     Heart sounds: Normal heart sounds. No murmur heard.   Pulmonary:     Effort: Pulmonary effort is normal. No respiratory distress.     Breath sounds: Normal breath sounds. No wheezing or rales.  Abdominal:     General: Bowel sounds are normal. There is no distension.     Palpations: Abdomen is soft.     Tenderness: There is no abdominal tenderness.  Musculoskeletal:        General: Normal range of motion.     Cervical back: Normal range of motion and neck supple.  Lymphadenopathy:     Cervical: No cervical adenopathy.  Skin:    General: Skin is warm and dry.  Neurological:     Mental Status: She is alert and oriented to person, place, and time.  Psychiatric:        Behavior: Behavior normal.        Thought Content: Thought content normal.        Judgment: Judgment normal.       Assessment & Plan:   Ambriella was seen today for edema and fall.  Diagnoses and all orders for this visit:  Imbalance -     CBC with Differential/Platelet -     CMP14+EGFR -     Brain natriuretic peptide -     Urinalysis, Complete -     Cancel: Urinalysis -     Microscopic Examination -     amoxicillin (AMOXIL) 500 MG capsule; Take 1 capsule (500 mg total) by mouth 3 (three) times daily.  Bilateral leg edema -     CBC with Differential/Platelet -     CMP14+EGFR -     Brain natriuretic peptide -     Urinalysis, Complete -     Cancel: Urinalysis -     US Venous Img Lower Bilateral (DVT); Future -     amoxicillin (AMOXIL) 500 MG capsule; Take 1 capsule (500 mg total) by mouth 3  (three) times daily.  Electrolyte imbalance -     CBC with Differential/Platelet -     CMP14+EGFR -     Brain natriuretic peptide -     Urinalysis, Complete -     Cancel: Urinalysis -     amoxicillin (AMOXIL) 500 MG capsule; Take 1 capsule (500 mg total) by mouth 3 (three) times daily.  Blunt head trauma, initial encounter -     CT Head Wo Contrast; Future -  amoxicillin (AMOXIL) 500 MG capsule; Take 1 capsule (500 mg total) by mouth 3 (three) times daily.   Results for orders placed or performed in visit on 06/26/20  Microscopic Examination   Urine  Result Value Ref Range   WBC, UA 0-5 0 - 5 /hpf   RBC None seen 0 - 2 /hpf   Epithelial Cells (non renal) 0-10 0 - 10 /hpf   Bacteria, UA None seen None seen/Few  CBC with Differential/Platelet  Result Value Ref Range   WBC 11.6 (H) 3.4 - 10.8 x10E3/uL   RBC 4.53 3.77 - 5.28 x10E6/uL   Hemoglobin 13.1 11.1 - 15.9 g/dL   Hematocrit 38.2 34.0 - 46.6 %   MCV 84 79 - 97 fL   MCH 28.9 26.6 - 33.0 pg   MCHC 34.3 31 - 35 g/dL   RDW 11.9 11.7 - 15.4 %   Platelets 331 150 - 450 x10E3/uL   Neutrophils 75 Not Estab. %   Lymphs 13 Not Estab. %   Monocytes 8 Not Estab. %   Eos 2 Not Estab. %   Basos 1 Not Estab. %   Neutrophils Absolute 8.7 (H) 1 - 7 x10E3/uL   Lymphocytes Absolute 1.5 0 - 3 x10E3/uL   Monocytes Absolute 1.0 (H) 0 - 0 x10E3/uL   EOS (ABSOLUTE) 0.2 0.0 - 0.4 x10E3/uL   Basophils Absolute 0.1 0 - 0 x10E3/uL   Immature Granulocytes 1 Not Estab. %   Immature Grans (Abs) 0.2 (H) 0.0 - 0.1 x10E3/uL  CMP14+EGFR  Result Value Ref Range   Glucose 149 (H) 65 - 99 mg/dL   BUN 14 8 - 27 mg/dL   Creatinine, Ser 1.45 (H) 0.57 - 1.00 mg/dL   GFR calc non Af Amer 38 (L) >59 mL/min/1.73   GFR calc Af Amer 43 (L) >59 mL/min/1.73   BUN/Creatinine Ratio 10 (L) 12 - 28   Sodium 137 134 - 144 mmol/L   Potassium 3.6 3.5 - 5.2 mmol/L   Chloride 98 96 - 106 mmol/L   CO2 20 20 - 29 mmol/L   Calcium 8.6 (L) 8.7 - 10.3 mg/dL   Total  Protein 6.1 6.0 - 8.5 g/dL   Albumin 3.3 (L) 3.8 - 4.8 g/dL   Globulin, Total 2.8 1.5 - 4.5 g/dL   Albumin/Globulin Ratio 1.2 1.2 - 2.2   Bilirubin Total 0.2 0.0 - 1.2 mg/dL   Alkaline Phosphatase 257 (H) 48 - 121 IU/L   AST 26 0 - 40 IU/L   ALT 31 0 - 32 IU/L  Brain natriuretic peptide  Result Value Ref Range   BNP 31.7 0.0 - 100.0 pg/mL  Urinalysis, Complete  Result Value Ref Range   Specific Gravity, UA 1.015 1.005 - 1.030   pH, UA 5.5 5.0 - 7.5   Color, UA Yellow Yellow   Appearance Ur Clear Clear   Leukocytes,UA Trace (A) Negative   Protein,UA Negative Negative/Trace   Glucose, UA Negative Negative   Ketones, UA Negative Negative   RBC, UA Trace (A) Negative   Bilirubin, UA Negative Negative   Urobilinogen, Ur 0.2 0.2 - 1.0 mg/dL   Nitrite, UA Negative Negative   Microscopic Examination See below:        I have discontinued Jeani Hawking B. Tippets's furosemide. I am also having her start on amoxicillin. Additionally, I am having her maintain her Vitamin D (Ergocalciferol), rizatriptan, albuterol, gabapentin, cetirizine, fluticasone, QUEtiapine, DULoxetine, QUEtiapine, nystatin cream, meclizine, donepezil, cyclobenzaprine, lamoTRIgine, and multivitamin.  Allergies as of 06/26/2020  Reactions   Bactrim [sulfamethoxazole-trimethoprim]    Raw and irritatated in mouth.   Darvocet [propoxyphene N-acetaminophen]    Decongestant [oxymetazoline]    Elavil [amitriptyline]    Lithium Other (See Comments)   Hx toxicity   Risperidone And Related    Ultram [tramadol]    Keflex [cephalexin] Rash      Medication List       Accurate as of June 26, 2020 11:59 PM. If you have any questions, ask your nurse or doctor.        STOP taking these medications   furosemide 20 MG tablet Commonly known as: LASIX Stopped by: Claretta Fraise, MD     TAKE these medications   albuterol 108 (90 Base) MCG/ACT inhaler Commonly known as: VENTOLIN HFA Inhale 2 puffs into the lungs every 6  (six) hours as needed for wheezing or shortness of breath.   amoxicillin 500 MG capsule Commonly known as: AMOXIL Take 1 capsule (500 mg total) by mouth 3 (three) times daily. Started by: Claretta Fraise, MD   cetirizine 10 MG tablet Commonly known as: ZYRTEC Take 1 tablet (10 mg total) by mouth daily.   cyclobenzaprine 10 MG tablet Commonly known as: FLEXERIL Take 0.5-1 tablets (5-10 mg total) by mouth 2 (two) times daily as needed for muscle spasms.   donepezil 10 MG tablet Commonly known as: ARICEPT TAKE 1 TABLET BY MOUTH EVERY DAY   DULoxetine 60 MG capsule Commonly known as: CYMBALTA TAKE 1 CAPSULE BY MOUTH EVERY DAY   fluticasone 50 MCG/ACT nasal spray Commonly known as: FLONASE SPRAY 2 SPRAYS INTO EACH NOSTRIL EVERY DAY   gabapentin 300 MG capsule Commonly known as: NEURONTIN TAKE 3 CAPSULES BY MOUTH THREE TIMES A DAY What changed: See the new instructions.   lamoTRIgine 200 MG tablet Commonly known as: LAMICTAL TAKE 1 TABLET BY MOUTH EVERY DAY   meclizine 25 MG tablet Commonly known as: ANTIVERT TAKE 1 TABLET (25 MG TOTAL) BY MOUTH 3 (THREE) TIMES DAILY AS NEEDED FOR DIZZINESS.   multivitamin Liqd Take 15 mLs by mouth daily.   nystatin 100000 UNIT/ML suspension Commonly known as: MYCOSTATIN Take 5 mLs (500,000 Units total) by mouth 4 (four) times daily for 5 days.   nystatin cream Commonly known as: MYCOSTATIN Apply topically 2 (two) times daily.   pantoprazole 40 MG tablet Commonly known as: PROTONIX TAKE 2 TABLETS BY MOUTH EVERY DAY   QUEtiapine 200 MG tablet Commonly known as: SEROQUEL TAKE 1 TABLET BY MOUTH EVERY DAY   QUEtiapine 400 MG tablet Commonly known as: SEROQUEL TAKE 2 TABLETS (800 MG TOTAL) BY MOUTH AT BEDTIME.   rizatriptan 10 MG tablet Commonly known as: MAXALT Take 1 tablet (10 mg total) by mouth as needed. May repeat in 2 hours if needed   Vitamin D (Ergocalciferol) 1.25 MG (50000 UNIT) Caps capsule Commonly known as:  DRISDOL Take 50,000 Units by mouth 2 (two) times a week.        Follow-up: Return if symptoms worsen or fail to improve.  Claretta Fraise, M.D.

## 2020-06-26 NOTE — Telephone Encounter (Signed)
Please let the patient know that I sent their prescription to their pharmacy. Thanks, WS 

## 2020-06-26 NOTE — Telephone Encounter (Signed)
Pt checking status on antibiotic being sent to the pharmacy.

## 2020-06-27 ENCOUNTER — Ambulatory Visit (HOSPITAL_COMMUNITY)
Admission: RE | Admit: 2020-06-27 | Discharge: 2020-06-27 | Disposition: A | Discharge status: Home / Self Care | Payer: Medicare Other | Source: Ambulatory Visit | Attending: Family Medicine | Admitting: Family Medicine

## 2020-06-27 ENCOUNTER — Other Ambulatory Visit: Payer: Self-pay

## 2020-06-27 DIAGNOSIS — R600 Localized edema: Secondary | ICD-10-CM

## 2020-06-27 DIAGNOSIS — S098XXA Other specified injuries of head, initial encounter: Secondary | ICD-10-CM | POA: Diagnosis present

## 2020-06-27 LAB — CMP14+EGFR
ALT: 31 IU/L (ref 0–32)
AST: 26 IU/L (ref 0–40)
Albumin/Globulin Ratio: 1.2 (ref 1.2–2.2)
Albumin: 3.3 g/dL — ABNORMAL LOW (ref 3.8–4.8)
Alkaline Phosphatase: 257 IU/L — ABNORMAL HIGH (ref 48–121)
BUN/Creatinine Ratio: 10 — ABNORMAL LOW (ref 12–28)
BUN: 14 mg/dL (ref 8–27)
Bilirubin Total: 0.2 mg/dL (ref 0.0–1.2)
CO2: 20 mmol/L (ref 20–29)
Calcium: 8.6 mg/dL — ABNORMAL LOW (ref 8.7–10.3)
Chloride: 98 mmol/L (ref 96–106)
Creatinine, Ser: 1.45 mg/dL — ABNORMAL HIGH (ref 0.57–1.00)
GFR calc Af Amer: 43 mL/min/{1.73_m2} — ABNORMAL LOW (ref >59)
GFR calc non Af Amer: 38 mL/min/{1.73_m2} — ABNORMAL LOW (ref >59)
Globulin, Total: 2.8 g/dL (ref 1.5–4.5)
Glucose: 149 mg/dL — ABNORMAL HIGH (ref 65–99)
Potassium: 3.6 mmol/L (ref 3.5–5.2)
Sodium: 137 mmol/L (ref 134–144)
Total Protein: 6.1 g/dL (ref 6.0–8.5)

## 2020-06-27 LAB — CBC WITH DIFFERENTIAL/PLATELET
Basophils Absolute: 0.1 10*3/uL (ref 0.0–0.2)
Basos: 1 %
EOS (ABSOLUTE): 0.2 10*3/uL (ref 0.0–0.4)
Eos: 2 %
Hematocrit: 38.2 % (ref 34.0–46.6)
Hemoglobin: 13.1 g/dL (ref 11.1–15.9)
Immature Grans (Abs): 0.2 10*3/uL — ABNORMAL HIGH (ref 0.0–0.1)
Immature Granulocytes: 1 %
Lymphocytes Absolute: 1.5 10*3/uL (ref 0.7–3.1)
Lymphs: 13 %
MCH: 28.9 pg (ref 26.6–33.0)
MCHC: 34.3 g/dL (ref 31.5–35.7)
MCV: 84 fL (ref 79–97)
Monocytes Absolute: 1 10*3/uL — ABNORMAL HIGH (ref 0.1–0.9)
Monocytes: 8 %
Neutrophils Absolute: 8.7 10*3/uL — ABNORMAL HIGH (ref 1.4–7.0)
Neutrophils: 75 %
Platelets: 331 10*3/uL (ref 150–450)
RBC: 4.53 x10E6/uL (ref 3.77–5.28)
RDW: 11.9 % (ref 11.7–15.4)
WBC: 11.6 10*3/uL — ABNORMAL HIGH (ref 3.4–10.8)

## 2020-06-27 LAB — BRAIN NATRIURETIC PEPTIDE: BNP: 31.7 pg/mL (ref 0.0–100.0)

## 2020-06-27 NOTE — Telephone Encounter (Signed)
Patient aware.

## 2020-06-28 ENCOUNTER — Other Ambulatory Visit: Payer: Self-pay | Admitting: Family Medicine

## 2020-06-28 ENCOUNTER — Telehealth: Payer: Self-pay | Admitting: Family Medicine

## 2020-06-28 MED ORDER — FLUCONAZOLE 150 MG PO TABS
150.0000 mg | ORAL_TABLET | Freq: Once | ORAL | 0 refills | Status: AC
Start: 2020-06-28 — End: 2020-06-28

## 2020-06-28 NOTE — Telephone Encounter (Signed)
Pt informed

## 2020-06-28 NOTE — Telephone Encounter (Signed)
  Incoming Patient Call  06/28/2020  What symptoms do you have? Patient is on ABX and has a yeast infection, Has itching and burning and wants something called in. Seen [facility] 8[facility]11. Has appt 8-17 with JE for hospital f/u.  How long have you been sick? Yesterday morning  Have you been seen for this problem? Yes  If your provider decides to give you a prescription, which pharmacy would you like for it to be sent to? [facility] in [place]   Patient informed that this information will be sent to the clinical staff for review and that they should receive a follow up call.

## 2020-06-28 NOTE — Progress Notes (Signed)
Hello [name],  Your lab result is normal and/or stable.Some minor variations that are not significant are commonly marked abnormal, but do not represent any medical problem for you.  Best regards, Mechele Claude, M.D.

## 2020-06-28 NOTE — Telephone Encounter (Signed)
Please let the patient know that I sent their prescription to their pharmacy. Thanks, WS 

## 2020-07-02 ENCOUNTER — Ambulatory Visit (INDEPENDENT_AMBULATORY_CARE_PROVIDER_SITE_OTHER): Payer: Medicare Other | Admitting: Nurse Practitioner

## 2020-07-02 DIAGNOSIS — M25511 Pain in right shoulder: Secondary | ICD-10-CM | POA: Insufficient documentation

## 2020-07-02 DIAGNOSIS — T7840XA Allergy, unspecified, initial encounter: Secondary | ICD-10-CM

## 2020-07-02 DIAGNOSIS — R21 Rash and other nonspecific skin eruption: Secondary | ICD-10-CM

## 2020-07-02 NOTE — Progress Notes (Signed)
Virtual Visit via telephone Note  I connected with Erin Donaldson on 07/02/20 at 11.10 by telephone and verified that I am speaking with the correct person using two identifiers. Erin Donaldson is currently located at home and grand son are currently with her during visit. The provider, Daryll Drown, NP is located in their office at time of visit.  Call ended at 11:15 am  I discussed the limitations, risks, security and privacy concerns of performing an evaluation and management service by telephone and the availability of in person appointments. I also discussed with the patient that there may be a patient responsible charge related to this service. The patient expressed understanding and agreed to proceed.   History and Present Illness:  Patient is a 69 year old female who presents with right shoulder pain.  Over televisit.  Patient reports pain is progressively worse since she fell about 10 days ago. Shoulder Pain  The pain is present in the right shoulder and right arm. This is a new problem. The current episode started in the past 7 days. There has been a history of trauma. The problem occurs constantly. The problem has been gradually worsening. The pain is moderate. Associated symptoms include a limited range of motion. Pertinent negatives include no fever or joint swelling. She has tried nothing for the symptoms.    Rash: Patient complains of rash involving the bilateral arm. Rash started 5 days ago. Appearance of rash at onset: hives. Rash has not changed over time. Initial distribution: Bilateral arms.  Discomfort associated with rash: causes no discomfort.  Associated symptoms: none. Denies: abdominal pain, cough, decrease in appetite and headache. Patient has not had previous evaluation of rash. Patient has not had previous treatment.  Response to treatment: n/a. Patient has not had contacts with similar rash. Patient has identified precipitant. Patient has had new exposures to  medications.  Patient started  amoxicillin and attributes rash to medication.  Outpatient Encounter Medications as of 07/02/2020  Medication Sig  . albuterol (VENTOLIN HFA) 108 (90 Base) MCG/ACT inhaler Inhale 2 puffs into the lungs every 6 (six) hours as needed for wheezing or shortness of breath.  Marland Kitchen amoxicillin (AMOXIL) 500 MG capsule Take 1 capsule (500 mg total) by mouth 3 (three) times daily.  . cetirizine (ZYRTEC) 10 MG tablet Take 1 tablet (10 mg total) by mouth daily.  . cyclobenzaprine (FLEXERIL) 10 MG tablet Take 0.5-1 tablets (5-10 mg total) by mouth 2 (two) times daily as needed for muscle spasms.  Marland Kitchen donepezil (ARICEPT) 10 MG tablet TAKE 1 TABLET BY MOUTH EVERY DAY  . DULoxetine (CYMBALTA) 60 MG capsule TAKE 1 CAPSULE BY MOUTH EVERY DAY  . fluticasone (FLONASE) 50 MCG/ACT nasal spray SPRAY 2 SPRAYS INTO EACH NOSTRIL EVERY DAY  . gabapentin (NEURONTIN) 300 MG capsule TAKE 3 CAPSULES BY MOUTH THREE TIMES A DAY (Patient taking differently: Take 900 mg by mouth 3 (three) times daily. )  . lamoTRIgine (LAMICTAL) 200 MG tablet TAKE 1 TABLET BY MOUTH EVERY DAY  . meclizine (ANTIVERT) 25 MG tablet TAKE 1 TABLET (25 MG TOTAL) BY MOUTH 3 (THREE) TIMES DAILY AS NEEDED FOR DIZZINESS.  . Multiple Vitamin (MULTIVITAMIN) LIQD Take 15 mLs by mouth daily.  Marland Kitchen nystatin cream (MYCOSTATIN) Apply topically 2 (two) times daily.  . pantoprazole (PROTONIX) 40 MG tablet TAKE 2 TABLETS BY MOUTH EVERY DAY  . QUEtiapine (SEROQUEL) 200 MG tablet TAKE 1 TABLET BY MOUTH EVERY DAY  . QUEtiapine (SEROQUEL) 400 MG tablet TAKE 2 TABLETS (  800 MG TOTAL) BY MOUTH AT BEDTIME.  . rizatriptan (MAXALT) 10 MG tablet Take 1 tablet (10 mg total) by mouth as needed. May repeat in 2 hours if needed  . Vitamin D, Ergocalciferol, (DRISDOL) 1.25 MG (50000 UT) CAPS capsule Take 50,000 Units by mouth 2 (two) times a week.    No facility-administered encounter medications on file as of 07/02/2020.    Review of Systems    Constitutional: Negative for fever.  Eyes: Negative.   Respiratory: Negative.   Cardiovascular: Negative.   Genitourinary: Negative.   Musculoskeletal: Negative.   Skin: Positive for rash.  Neurological: Negative.     Observations/Objective:   Assessment and Plan: Problem List Items Addressed This Visit      Musculoskeletal and Integument   Rash due to allergy    Patient complains of rash involving bilateral arms in the last 5 days.  Appearance of the rash looks like hives.  Patient is not reporting any pain, burning or itching.  Patient reports starting amoxicillin and attributes rash to new medication started 5 days ago. Provided education to patient on allergic reaction due to penicillin.  Advised patient that with no anaphylactic reaction therapeutic effect of medication outweighs allergic rash.  Advised patient to complete medication as ordered.  Follow-up with worsening or unresolved symptoms.         Other   Acute pain of right shoulder - Primary    Patient is a 69 year old female who presents with right shoulder pain.  Patient reports pain is progressively worse since she fell about 10 days ago.  The pain is present in the right shoulder and right arm, this is new for patient.  Pain is progressively worse and considered as moderate.  Patient has limited range of motion in the arm, denies any fever or joint swelling. Provided education to patient over the phone.  Advised patient to start anti-inflammatory ibuprofen 500 mg as needed, with ice application. Follow-up with worsening or uncontrolled pain.          Follow up plan: No follow-ups on file.     I discussed the assessment and treatment plan with the patient. The patient was provided an opportunity to ask questions and all were answered. The patient agreed with the plan and demonstrated an understanding of the instructions.   The patient was advised to call back or seek an in-person evaluation if the symptoms  worsen or if the condition fails to improve as anticipated.  The above assessment and management plan was discussed with the patient. The patient verbalized understanding of and has agreed to the management plan. Patient is aware to call the clinic if symptoms persist or worsen. Patient is aware when to return to the clinic for a follow-up visit. Patient educated on when it is appropriate to go to the emergency department.    I provided 10 minutes of non-face-to-face time during this encounter.    Daryll Drown, NP

## 2020-07-02 NOTE — Assessment & Plan Note (Signed)
Patient is a 69 year old female who presents with right shoulder pain.  Patient reports pain is progressively worse since she fell about 10 days ago.  The pain is present in the right shoulder and right arm, this is new for patient.  Pain is progressively worse and considered as moderate.  Patient has limited range of motion in the arm, denies any fever or joint swelling. Provided education to patient over the phone.  Advised patient to start anti-inflammatory ibuprofen 500 mg as needed, with ice application. Follow-up with worsening or uncontrolled pain.

## 2020-07-02 NOTE — Assessment & Plan Note (Signed)
Patient complains of rash involving bilateral arms in the last 5 days.  Appearance of the rash looks like hives.  Patient is not reporting any pain, burning or itching.  Patient reports starting amoxicillin and attributes rash to new medication started 5 days ago. Provided education to patient on allergic reaction due to penicillin.  Advised patient that with no anaphylactic reaction therapeutic effect of medication outweighs allergic rash.  Advised patient to complete medication as ordered.  Follow-up with worsening or unresolved symptoms.

## 2020-07-03 ENCOUNTER — Other Ambulatory Visit: Payer: Self-pay | Admitting: Family Medicine

## 2020-07-03 DIAGNOSIS — K219 Gastro-esophageal reflux disease without esophagitis: Secondary | ICD-10-CM

## 2020-07-05 ENCOUNTER — Telehealth: Payer: Self-pay | Admitting: Family Medicine

## 2020-07-05 NOTE — Telephone Encounter (Signed)
Pt is concerned with edema in both lower extremities and she still has "rash" on both legs. Scheduled 07/09/20 at 12:05.

## 2020-07-06 ENCOUNTER — Encounter: Payer: Self-pay | Admitting: Family Medicine

## 2020-07-07 ENCOUNTER — Encounter (HOSPITAL_COMMUNITY): Payer: Self-pay | Admitting: Emergency Medicine

## 2020-07-07 ENCOUNTER — Emergency Department (HOSPITAL_COMMUNITY): Payer: Medicare Other

## 2020-07-07 ENCOUNTER — Other Ambulatory Visit: Payer: Self-pay

## 2020-07-07 ENCOUNTER — Inpatient Hospital Stay (HOSPITAL_COMMUNITY)
Admission: EM | Admit: 2020-07-07 | Discharge: 2020-07-10 | DRG: 087 | Disposition: A | Discharge status: Home Health | Payer: Medicare Other | Attending: Neurosurgery | Admitting: Neurosurgery

## 2020-07-07 DIAGNOSIS — W010XXA Fall on same level from slipping, tripping and stumbling without subsequent striking against object, initial encounter: Secondary | ICD-10-CM | POA: Diagnosis present

## 2020-07-07 DIAGNOSIS — S065X0A Traumatic subdural hemorrhage without loss of consciousness, initial encounter: Principal | ICD-10-CM | POA: Diagnosis present

## 2020-07-07 DIAGNOSIS — S02119A Unspecified fracture of occiput, initial encounter for closed fracture: Secondary | ICD-10-CM | POA: Diagnosis present

## 2020-07-07 DIAGNOSIS — E785 Hyperlipidemia, unspecified: Secondary | ICD-10-CM | POA: Diagnosis present

## 2020-07-07 DIAGNOSIS — E876 Hypokalemia: Secondary | ICD-10-CM | POA: Diagnosis not present

## 2020-07-07 DIAGNOSIS — I10 Essential (primary) hypertension: Secondary | ICD-10-CM | POA: Diagnosis present

## 2020-07-07 DIAGNOSIS — B379 Candidiasis, unspecified: Secondary | ICD-10-CM | POA: Diagnosis present

## 2020-07-07 DIAGNOSIS — D649 Anemia, unspecified: Secondary | ICD-10-CM | POA: Diagnosis present

## 2020-07-07 DIAGNOSIS — Z888 Allergy status to other drugs, medicaments and biological substances status: Secondary | ICD-10-CM

## 2020-07-07 DIAGNOSIS — S065X9A Traumatic subdural hemorrhage with loss of consciousness of unspecified duration, initial encounter: Secondary | ICD-10-CM

## 2020-07-07 DIAGNOSIS — R402362 Coma scale, best motor response, obeys commands, at arrival to emergency department: Secondary | ICD-10-CM | POA: Diagnosis present

## 2020-07-07 DIAGNOSIS — R402142 Coma scale, eyes open, spontaneous, at arrival to emergency department: Secondary | ICD-10-CM | POA: Diagnosis present

## 2020-07-07 DIAGNOSIS — E119 Type 2 diabetes mellitus without complications: Secondary | ICD-10-CM | POA: Diagnosis present

## 2020-07-07 DIAGNOSIS — S065XAA Traumatic subdural hemorrhage with loss of consciousness status unknown, initial encounter: Secondary | ICD-10-CM | POA: Diagnosis present

## 2020-07-07 DIAGNOSIS — R296 Repeated falls: Secondary | ICD-10-CM | POA: Diagnosis present

## 2020-07-07 DIAGNOSIS — Y9301 Activity, walking, marching and hiking: Secondary | ICD-10-CM | POA: Diagnosis present

## 2020-07-07 DIAGNOSIS — Z9181 History of falling: Secondary | ICD-10-CM

## 2020-07-07 DIAGNOSIS — M542 Cervicalgia: Secondary | ICD-10-CM | POA: Diagnosis present

## 2020-07-07 DIAGNOSIS — Z23 Encounter for immunization: Secondary | ICD-10-CM

## 2020-07-07 DIAGNOSIS — Z20822 Contact with and (suspected) exposure to covid-19: Secondary | ICD-10-CM | POA: Diagnosis present

## 2020-07-07 DIAGNOSIS — R079 Chest pain, unspecified: Secondary | ICD-10-CM | POA: Diagnosis present

## 2020-07-07 DIAGNOSIS — F319 Bipolar disorder, unspecified: Secondary | ICD-10-CM | POA: Diagnosis present

## 2020-07-07 DIAGNOSIS — M25511 Pain in right shoulder: Secondary | ICD-10-CM | POA: Diagnosis present

## 2020-07-07 DIAGNOSIS — S0101XA Laceration without foreign body of scalp, initial encounter: Secondary | ICD-10-CM | POA: Diagnosis present

## 2020-07-07 DIAGNOSIS — R402252 Coma scale, best verbal response, oriented, at arrival to emergency department: Secondary | ICD-10-CM | POA: Diagnosis present

## 2020-07-07 HISTORY — DX: Bipolar disorder, unspecified: F31.9

## 2020-07-07 HISTORY — DX: Type 2 diabetes mellitus without complications: E11.9

## 2020-07-07 LAB — I-STAT CHEM 8, ED
BUN: 12 mg/dL (ref 8–23)
Calcium, Ion: 1.13 mmol/L — ABNORMAL LOW (ref 1.15–1.40)
Chloride: 106 mmol/L (ref 98–111)
Creatinine, Ser: 0.9 mg/dL (ref 0.44–1.00)
Glucose, Bld: 115 mg/dL — ABNORMAL HIGH (ref 70–99)
HCT: 31 % — ABNORMAL LOW (ref 36.0–46.0)
Hemoglobin: 10.5 g/dL — ABNORMAL LOW (ref 12.0–15.0)
Potassium: 2.6 mmol/L — CL (ref 3.5–5.1)
Sodium: 141 mmol/L (ref 135–145)
TCO2: 23 mmol/L (ref 22–32)

## 2020-07-07 LAB — COMPREHENSIVE METABOLIC PANEL
ALT: 24 U/L (ref 0–44)
AST: 26 U/L (ref 15–41)
Albumin: 2.2 g/dL — ABNORMAL LOW (ref 3.5–5.0)
Alkaline Phosphatase: 153 U/L — ABNORMAL HIGH (ref 38–126)
Anion gap: 12 (ref 5–15)
BUN: 12 mg/dL (ref 8–23)
CO2: 21 mmol/L — ABNORMAL LOW (ref 22–32)
Calcium: 8.3 mg/dL — ABNORMAL LOW (ref 8.9–10.3)
Chloride: 108 mmol/L (ref 98–111)
Creatinine, Ser: 1.17 mg/dL — ABNORMAL HIGH (ref 0.44–1.00)
GFR calc Af Amer: 56 mL/min — ABNORMAL LOW (ref >60)
GFR calc non Af Amer: 49 mL/min — ABNORMAL LOW (ref >60)
Glucose, Bld: 139 mg/dL — ABNORMAL HIGH (ref 70–99)
Potassium: 2.7 mmol/L — CL (ref 3.5–5.1)
Sodium: 141 mmol/L (ref 135–145)
Total Bilirubin: 0.6 mg/dL (ref 0.3–1.2)
Total Protein: 5.1 g/dL — ABNORMAL LOW (ref 6.5–8.1)

## 2020-07-07 LAB — URINALYSIS, ROUTINE W REFLEX MICROSCOPIC
Bilirubin Urine: NEGATIVE
Glucose, UA: NEGATIVE mg/dL
Ketones, ur: NEGATIVE mg/dL
Nitrite: NEGATIVE
Protein, ur: NEGATIVE mg/dL
Specific Gravity, Urine: 1.009 (ref 1.005–1.030)
pH: 5 (ref 5.0–8.0)

## 2020-07-07 LAB — CBC
HCT: 33.2 % — ABNORMAL LOW (ref 36.0–46.0)
Hemoglobin: 10.7 g/dL — ABNORMAL LOW (ref 12.0–15.0)
MCH: 28.6 pg (ref 26.0–34.0)
MCHC: 32.2 g/dL (ref 30.0–36.0)
MCV: 88.8 fL (ref 80.0–100.0)
Platelets: 316 10*3/uL (ref 150–400)
RBC: 3.74 MIL/uL — ABNORMAL LOW (ref 3.87–5.11)
RDW: 13.4 % (ref 11.5–15.5)
WBC: 6.9 10*3/uL (ref 4.0–10.5)
nRBC: 0 % (ref 0.0–0.2)

## 2020-07-07 LAB — SAMPLE TO BLOOD BANK

## 2020-07-07 LAB — PROTIME-INR
INR: 1 (ref 0.8–1.2)
Prothrombin Time: 12.4 seconds (ref 11.4–15.2)

## 2020-07-07 LAB — HIV ANTIBODY (ROUTINE TESTING W REFLEX): HIV Screen 4th Generation wRfx: NONREACTIVE

## 2020-07-07 LAB — MAGNESIUM: Magnesium: 1.6 mg/dL — ABNORMAL LOW (ref 1.7–2.4)

## 2020-07-07 LAB — ETHANOL: Alcohol, Ethyl (B): <10 mg/dL (ref <10)

## 2020-07-07 LAB — SARS CORONAVIRUS 2 BY RT PCR (HOSPITAL ORDER, PERFORMED IN ~~LOC~~ HOSPITAL LAB): SARS Coronavirus 2: NEGATIVE

## 2020-07-07 MED ORDER — ONDANSETRON HCL 4 MG/2ML IJ SOLN
4.0000 mg | Freq: Four times a day (QID) | INTRAMUSCULAR | Status: DC | PRN
  Administered 2020-07-09: 4 mg via INTRAVENOUS
  Filled 2020-07-07: qty 2

## 2020-07-07 MED ORDER — SODIUM CHLORIDE 0.9% FLUSH
3.0000 mL | Freq: Two times a day (BID) | INTRAVENOUS | Status: DC
  Administered 2020-07-08 – 2020-07-10 (×4): 3 mL via INTRAVENOUS

## 2020-07-07 MED ORDER — POTASSIUM CHLORIDE CRYS ER 20 MEQ PO TBCR
40.0000 meq | EXTENDED_RELEASE_TABLET | Freq: Two times a day (BID) | ORAL | Status: AC
  Administered 2020-07-07 (×2): 40 meq via ORAL
  Filled 2020-07-07 (×2): qty 2

## 2020-07-07 MED ORDER — ACETAMINOPHEN 325 MG PO TABS: 650.0000 mg | ORAL_TABLET | Freq: Four times a day (QID) | ORAL | Status: DC | PRN

## 2020-07-07 MED ORDER — TETANUS-DIPHTH-ACELL PERTUSSIS 5-2.5-18.5 LF-MCG/0.5 IM SUSP
0.5000 mL | Freq: Once | INTRAMUSCULAR | Status: AC
  Administered 2020-07-07: 0.5 mL via INTRAMUSCULAR
  Filled 2020-07-07: qty 0.5

## 2020-07-07 MED ORDER — SODIUM CHLORIDE 0.9% FLUSH: 3.0000 mL | INTRAVENOUS | Status: DC | PRN

## 2020-07-07 MED ORDER — SODIUM CHLORIDE 0.9 % IV SOLN: 250.0000 mL | INTRAVENOUS | Status: DC | PRN

## 2020-07-07 MED ORDER — ACETAMINOPHEN 650 MG RE SUPP: 650.0000 mg | Freq: Four times a day (QID) | RECTAL | Status: DC | PRN

## 2020-07-07 MED ORDER — POTASSIUM CHLORIDE IN NACL 20-0.9 MEQ/L-% IV SOLN
INTRAVENOUS | Status: DC
  Filled 2020-07-07 (×2): qty 1000

## 2020-07-07 MED ORDER — HYDROCODONE-ACETAMINOPHEN 5-325 MG PO TABS
1.0000 | ORAL_TABLET | ORAL | Status: DC | PRN
  Administered 2020-07-08: 1 via ORAL
  Administered 2020-07-08 (×3): 2 via ORAL
  Administered 2020-07-09: 1 via ORAL
  Filled 2020-07-07: qty 1
  Filled 2020-07-07 (×3): qty 2
  Filled 2020-07-07: qty 1

## 2020-07-07 MED ORDER — POTASSIUM CHLORIDE 10 MEQ/100ML IV SOLN: 10.0000 meq | INTRAVENOUS | Status: DC

## 2020-07-07 MED ORDER — ONDANSETRON HCL 4 MG PO TABS: 4.0000 mg | ORAL_TABLET | Freq: Four times a day (QID) | ORAL | Status: DC | PRN

## 2020-07-07 MED ORDER — HYDROMORPHONE HCL 1 MG/ML IJ SOLN: 0.5000 mg | INTRAMUSCULAR | Status: DC | PRN

## 2020-07-07 NOTE — Progress Notes (Signed)
Orthopedic Tech Progress Note Patient Details:  Erin Donaldson 1951-04-15 [id number] Level 2 Trauma Patient ID: [name], female   DOB: 1951-09-10, 69 y.o.   MRN: [id number]   [name] 07/07/2020, 1:32 PM

## 2020-07-07 NOTE — ED Triage Notes (Signed)
Pt arrived [facility]. Per report pt lost balance, and fell onto her head. Pt alert and oriented x4.

## 2020-07-07 NOTE — ED Provider Notes (Addendum)
[facility] EMERGENCY DEPARTMENT Provider Note   CSN: [id number] Arrival date & time: 07/07/20  1302     History Chief Complaint  Patient presents with  . Fall    Erin Donaldson is a 69 y.o. female.  Patient is a 69 year old female with a history of hypertension, bipolar disorder, diabetes, hyperlipidemia and irritable bowel syndrome who presents after a fall.  She was brought in as a level 2 trauma activation due to some confusion.  She says she was walking out in her driveway and turned and lost her balance and fell backward hitting her head.  She says that she did not have any symptoms prior to the fall such as dizziness, chest pain or shortness of breath.  She says she just lost her balance and fell.  She does state that she has had several falls over the last few weeks and feels like she is more unsteady than she normally is.  She has seen her doctor for the symptoms.  She has not had any other recent illnesses.  No fevers, cough or congestion.  She complains of pain to the back of her head from the fall.  She was noted to be oriented per EMS but at times would say things that did not make sense.  I do see that she is on Aricept but I do not see a diagnosis of dementia on her records.  She has some pain in her right shoulder and right upper chest which she thinks is from one of the prior falls.  She denies any pain or any other injuries from today's fall.  She is not on anticoagulants.        History reviewed. No pertinent past medical history.  Patient Active Problem List   Diagnosis Date Noted  . Acute subdural hematoma (HCC) 07/07/2020    History reviewed. No pertinent surgical history.   OB History   No obstetric history on file.     No family history on file.  Social History   Tobacco Use  . Smoking status: Not on file  Substance Use Topics  . Alcohol use: Not on file  . Drug use: Not on file    Home Medications Prior to Admission  medications   Not on File    Allergies    Patient has no allergy information on record.  Review of Systems   Review of Systems  Constitutional: Negative for chills, diaphoresis, fatigue and fever.  HENT: Negative for congestion, rhinorrhea and sneezing.   Eyes: Negative.   Respiratory: Negative for cough, chest tightness and shortness of breath.   Cardiovascular: Positive for chest pain (Right rib pain). Negative for leg swelling.  Gastrointestinal: Negative for abdominal pain, blood in stool, diarrhea, nausea and vomiting.  Genitourinary: Negative for difficulty urinating, flank pain, frequency and hematuria.  Musculoskeletal: Positive for arthralgias. Negative for back pain.  Skin: Positive for wound. Negative for rash.  Neurological: Positive for headaches. Negative for dizziness, speech difficulty, weakness and numbness.       Has had frequent falls    Physical Exam Updated Vital Signs BP (!) 141/83   Pulse 97   Temp 98 F (36.7 C)   Resp 13   Ht 5\' 4"  (1.626 m)   Wt 67.1 kg   SpO2 97%   BMI 25.40 kg/m   Physical Exam Vitals reviewed.  Constitutional:      Appearance: She is well-developed.  HENT:     Head: Normocephalic.  Comments: Patient has 2 very small lacerations to the posterior scalp in the occipital area with an underlying hematoma.  The wounds are less than 0.5 cm and do not appear to need suturing.  There is no active bleeding.    Nose: Nose normal.  Eyes:     Conjunctiva/sclera: Conjunctivae normal.     Pupils: Pupils are equal, round, and reactive to light.  Neck:     Comments: No pain to the cervical, thoracic, or LS spine.  No step-offs or deformities noted Cardiovascular:     Rate and Rhythm: Normal rate and regular rhythm.     Heart sounds: No murmur heard.      Comments: No evidence of external trauma to the chest or abdomen Pulmonary:     Effort: Pulmonary effort is normal. No respiratory distress.     Breath sounds: Normal breath  sounds. No wheezing.  Chest:     Chest wall: Tenderness (Mild tenderness to the right anterior rib cage, no crepitus or deformity) present.  Abdominal:     General: Bowel sounds are normal. There is no distension.     Palpations: Abdomen is soft.     Tenderness: There is no abdominal tenderness.  Musculoskeletal:        General: Normal range of motion.     Comments: Mild tenderness on range of motion of the right shoulder, no other pain on palpation or range of motion of the extremities  Skin:    General: Skin is warm and dry.     Capillary Refill: Capillary refill takes less than 2 seconds.     Comments: She has some erythema with satellite lesions consistent with candidal infection in her bilateral inguinal areas  Neurological:     Mental Status: She is alert and oriented to person, place, and time.     ED Results / Procedures / Treatments   Labs (all labs ordered are listed, but only abnormal results are displayed) Labs Reviewed  COMPREHENSIVE METABOLIC PANEL - Abnormal; Notable for the following components:      Result Value   Potassium 2.7 (*)    CO2 21 (*)    Glucose, Bld 139 (*)    Creatinine, Ser 1.17 (*)    Calcium 8.3 (*)    Total Protein 5.1 (*)    Albumin 2.2 (*)    Alkaline Phosphatase 153 (*)    GFR calc non Af Amer 49 (*)    GFR calc Af Amer 56 (*)    All other components within normal limits  CBC - Abnormal; Notable for the following components:   RBC 3.74 (*)    Hemoglobin 10.7 (*)    HCT 33.2 (*)    All other components within normal limits  I-STAT CHEM 8, ED - Abnormal; Notable for the following components:   Potassium 2.6 (*)    Glucose, Bld 115 (*)    Calcium, Ion 1.13 (*)    Hemoglobin 10.5 (*)    HCT 31.0 (*)    All other components within normal limits  SARS CORONAVIRUS 2 BY RT PCR (HOSPITAL ORDER, PERFORMED IN [facility])  ETHANOL  PROTIME-INR  URINALYSIS, ROUTINE W REFLEX MICROSCOPIC  CBC WITH DIFFERENTIAL/PLATELET  APTT    PROTIME-INR  HIV ANTIBODY (ROUTINE TESTING W REFLEX)  MAGNESIUM  SAMPLE TO BLOOD BANK    EKG None  Radiology DG Chest 2 View  Result Date: 07/07/2020 CLINICAL DATA:  Pain after fall.  Right shoulder pain. EXAM: CHEST - 2  VIEW COMPARISON:  June 20, 2020 FINDINGS: Atelectasis in the left base. The heart, hila, mediastinum, lungs, and pleura are otherwise unremarkable. IMPRESSION: No active cardiopulmonary disease. Electronically Signed   By: Gerome Sam III M.D   On: 07/07/2020 14:26   DG Shoulder Right  Result Date: 07/07/2020 CLINICAL DATA:  Pain after fall EXAM: RIGHT SHOULDER - 2+ VIEW COMPARISON:  None. FINDINGS: There is no evidence of fracture or dislocation. There is no evidence of arthropathy or other focal bone abnormality. Soft tissues are unremarkable. IMPRESSION: Negative. Electronically Signed   By: Gerome Sam III M.D   On: 07/07/2020 14:28   CT HEAD WO CONTRAST  Result Date: 07/07/2020 CLINICAL DATA:  69 year old female with head and neck injury from fall. Currently on blood thinners. Initial encounter. EXAM: CT HEAD WITHOUT CONTRAST CT CERVICAL SPINE WITHOUT CONTRAST TECHNIQUE: Multidetector CT imaging of the head and cervical spine was performed following the standard protocol without intravenous contrast. Multiplanar CT image reconstructions of the cervical spine were also generated. COMPARISON:  06/27/2020 FINDINGS: CT HEAD FINDINGS Brain: There are new acute bilateral frontotemporoparietal subdural hematomas, measuring up to 4 mm on the RIGHT and 7 mm on the LEFT. 3 mm LEFT to RIGHT midline shift is noted. There is no evidence of hydrocephalus, intraparenchymal hemorrhage, or acute infarct. Mild chronic small-vessel white matter ischemic changes are again noted. Vascular: Carotid atherosclerotic calcifications are noted. Skull: A nondisplaced fracture involving the RIGHT occipital/parietal skull noted. No other fractures are noted. Sinuses/Orbits: No acute  abnormality Other: Moderate posterior RIGHT scalp soft tissue swelling/hematoma noted. CT CERVICAL SPINE FINDINGS Alignment: Normal. Skull base and vertebrae: No acute fracture of the cervical spine. No primary bone lesion or focal pathologic process. Soft tissues and spinal canal: No prevertebral fluid or swelling. No visible canal hematoma. Disc levels:  Unremarkable Upper chest: Ground-glass opacity in the LEFT lung apex is noted which may represent contusion/hemorrhage. Other: None IMPRESSION: 1. New acute bilateral frontotemporoparietal subdural hematomas (up to 4 mm on the RIGHT and 7 mm on the LEFT) with 3 mm LEFT to RIGHT midline shift. 2. Nondisplaced RIGHT occipital/parietal skull fracture with moderate posterior RIGHT scalp soft tissue swelling/hematoma. 3. No static evidence of acute injury to the cervical spine. 4. Ground-glass opacity in the LEFT lung apex-question contusion/hemorrhage. Critical Value/emergent results were called by telephone at the time of interpretation on 07/07/2020 at 2:05 pm to provider Persephone Schriever , who verbally acknowledged these results. Electronically Signed   By: Harmon Pier M.D.   On: 07/07/2020 14:05   CT CERVICAL SPINE WO CONTRAST  Result Date: 07/07/2020 CLINICAL DATA:  69 year old female with head and neck injury from fall. Currently on blood thinners. Initial encounter. EXAM: CT HEAD WITHOUT CONTRAST CT CERVICAL SPINE WITHOUT CONTRAST TECHNIQUE: Multidetector CT imaging of the head and cervical spine was performed following the standard protocol without intravenous contrast. Multiplanar CT image reconstructions of the cervical spine were also generated. COMPARISON:  06/27/2020 FINDINGS: CT HEAD FINDINGS Brain: There are new acute bilateral frontotemporoparietal subdural hematomas, measuring up to 4 mm on the RIGHT and 7 mm on the LEFT. 3 mm LEFT to RIGHT midline shift is noted. There is no evidence of hydrocephalus, intraparenchymal hemorrhage, or acute infarct.  Mild chronic small-vessel white matter ischemic changes are again noted. Vascular: Carotid atherosclerotic calcifications are noted. Skull: A nondisplaced fracture involving the RIGHT occipital/parietal skull noted. No other fractures are noted. Sinuses/Orbits: No acute abnormality Other: Moderate posterior RIGHT scalp soft tissue swelling/hematoma noted. CT CERVICAL  SPINE FINDINGS Alignment: Normal. Skull base and vertebrae: No acute fracture of the cervical spine. No primary bone lesion or focal pathologic process. Soft tissues and spinal canal: No prevertebral fluid or swelling. No visible canal hematoma. Disc levels:  Unremarkable Upper chest: Ground-glass opacity in the LEFT lung apex is noted which may represent contusion/hemorrhage. Other: None IMPRESSION: 1. New acute bilateral frontotemporoparietal subdural hematomas (up to 4 mm on the RIGHT and 7 mm on the LEFT) with 3 mm LEFT to RIGHT midline shift. 2. Nondisplaced RIGHT occipital/parietal skull fracture with moderate posterior RIGHT scalp soft tissue swelling/hematoma. 3. No static evidence of acute injury to the cervical spine. 4. Ground-glass opacity in the LEFT lung apex-question contusion/hemorrhage. Critical Value/emergent results were called by telephone at the time of interpretation on 07/07/2020 at 2:05 pm to provider Elin Seats , who verbally acknowledged these results. Electronically Signed   By: Harmon Pier M.D.   On: 07/07/2020 14:05    Procedures Procedures (including critical care time)  Medications Ordered in ED Medications  0.9 % NaCl with KCl 20 mEq/ L  infusion (has no administration in time range)  sodium chloride flush (NS) 0.9 % injection 3 mL (3 mLs Intravenous Not Given 07/07/20 1543)  sodium chloride flush (NS) 0.9 % injection 3 mL (has no administration in time range)  0.9 %  sodium chloride infusion (has no administration in time range)  ondansetron (ZOFRAN) tablet 4 mg (has no administration in time range)    Or   ondansetron (ZOFRAN) injection 4 mg (has no administration in time range)  acetaminophen (TYLENOL) tablet 650 mg (has no administration in time range)    Or  acetaminophen (TYLENOL) suppository 650 mg (has no administration in time range)  HYDROcodone-acetaminophen (NORCO/VICODIN) 5-325 MG per tablet 1-2 tablet (has no administration in time range)  HYDROmorphone (DILAUDID) injection 0.5-1 mg (has no administration in time range)  potassium chloride SA (KLOR-CON) CR tablet 40 mEq (has no administration in time range)  Tdap (BOOSTRIX) injection 0.5 mL (0.5 mLs Intramuscular Given 07/07/20 1451)    ED Course  I have reviewed the triage vital signs and the nursing notes.  Pertinent labs & imaging results that were available during my care of the patient were reviewed by me and considered in my medical decision making (see chart for details).    MDM Rules/Calculators/A&P                          Patient is a 69 year old female who presents after a mechanical fall.  She is oriented on arrival.  She reportedly had had some confusion during EMS transport.  She has no neurological deficits that are appreciated.  She has small lacerations to the posterior scalp.  CT scan shows bilateral subdural hematomas with a small amount of midline shift.  CT of cervical spine shows no acute abnormalities.  She had x-rays of her right shoulder and chest which showed no acute abnormalities.  Her labs are reviewed and shows evidence of a hypokalemia.  She has some mild anemia.  Her potassium was replaced with IV potassium.  She was given Tdap.  I spoke with Dr. Franky Macho with neurosurgery who will admit the patient for further treatment.  CRITICAL CARE Performed by: Rolan Bucco Total critical care time: 60 minutes Critical care time was exclusive of separately billable procedures and treating other patients. Critical care was necessary to treat or prevent imminent or life-threatening deterioration. Critical care  was time  spent personally by me on the following activities: development of treatment plan with patient and/or surrogate as well as nursing, discussions with consultants, evaluation of patient's response to treatment, examination of patient, obtaining history from patient or surrogate, ordering and performing treatments and interventions, ordering and review of laboratory studies, ordering and review of radiographic studies, pulse oximetry and re-evaluation of patient's condition.  Final Clinical Impression(s) / ED Diagnoses Final diagnoses:  Chest pain  SDH (subdural hematoma) (HCC)  Closed fracture of occipital bone, unspecified laterality, unspecified occipital fracture type, initial encounter (HCC)  Hypokalemia    Rx / DC Orders ED Discharge Orders    None       Rolan Bucco, MD 07/07/20 1554    Rolan Bucco, MD 07/07/20 1554

## 2020-07-07 NOTE — ED Notes (Signed)
Pt given a sprite 

## 2020-07-07 NOTE — H&P (Signed)
FM:BWGYK subdural hematoma, linear non depressed right occipital skull fracture.  Erin Donaldson is a 69 y.o. female Whom while going to her car trunk to retreive a package, twisted then fell striking the back of her head on the right side. Due to the visible bleeding a 911 call was made. She denies LOC. Erin Donaldson did state she had neck and shoulder pain on the right. Xrays showed no fractures in the neck or shoulder. Head CT showed bilateral acute subdural hematomas, larger on the left with very little midline shift 64mm.  Not on File History reviewed. No pertinent past medical history. History reviewed. No pertinent surgical history. No family history on file. Social History   Socioeconomic History  . Marital status: Widowed    Spouse name: Not on file  . Number of children: Not on file  . Years of education: Not on file  . Highest education level: Not on file  Occupational History  . Not on file  Tobacco Use  . Smoking status: Not on file  Substance and Sexual Activity  . Alcohol use: Not on file  . Drug use: Not on file  . Sexual activity: Not on file  Other Topics Concern  . Not on file  Social History Narrative  . Not on file   Social Determinants of Health   Financial Resource Strain:   . Difficulty of Paying Living Expenses: Not on file  Food Insecurity:   . Worried About Programme researcher, broadcasting/film/video in the Last Year: Not on file  . Ran Out of Food in the Last Year: Not on file  Transportation Needs:   . Lack of Transportation (Medical): Not on file  . Lack of Transportation (Non-Medical): Not on file  Physical Activity:   . Days of Exercise per Week: Not on file  . Minutes of Exercise per Session: Not on file  Stress:   . Feeling of Stress : Not on file  Social Connections:   . Frequency of Communication with Friends and Family: Not on file  . Frequency of Social Gatherings with Friends and Family: Not on file  . Attends Religious Services: Not on file  . Active  Member of Clubs or Organizations: Not on file  . Attends Banker Meetings: Not on file  . Marital Status: Not on file  Intimate Partner Violence:   . Fear of Current or Ex-Partner: Not on file  . Emotionally Abused: Not on file  . Physically Abused: Not on file  . Sexually Abused: Not on file   Prior to Admission medications   Not on File   Physical Exam Constitutional:      Appearance: She is normal weight.     Comments: Blood present on back of head and on pillow and sheets in ed stretcher  HENT:     Head:     Comments: Laceration right occiput    Right Ear: Tympanic membrane, ear canal and external ear normal.     Left Ear: Tympanic membrane, ear canal and external ear normal.     Nose: Nose normal.     Mouth/Throat:     Mouth: Mucous membranes are dry.  Eyes:     Extraocular Movements: Extraocular movements intact.     Conjunctiva/sclera: Conjunctivae normal.     Pupils: Pupils are equal, round, and reactive to light.  Cardiovascular:     Rate and Rhythm: Normal rate and regular rhythm.     Pulses: Normal pulses.  Pulmonary:  Effort: Pulmonary effort is normal.  Abdominal:     General: Abdomen is flat.     Palpations: Abdomen is soft.  Musculoskeletal:        General: Normal range of motion.     Cervical back: Normal range of motion.  Skin:    General: Skin is warm and dry.  Neurological:     General: No focal deficit present.     Mental Status: She is alert and oriented to person, place, and time.     GCS: GCS eye subscore is 4. GCS verbal subscore is 5. GCS motor subscore is 6.     Cranial Nerves: No cranial nerve deficit.     Sensory: No sensory deficit.     Motor: No weakness.     Coordination: Coordination normal.     Deep Tendon Reflexes: Reflexes are normal and symmetric. Babinski sign absent on the right side. Babinski sign absent on the left side.     Reflex Scores:      Tricep reflexes are 2+ on the right side and 2+ on the left side.       Bicep reflexes are 2+ on the right side and 2+ on the left side.      Brachioradialis reflexes are 2+ on the right side and 2+ on the left side.      Patellar reflexes are 2+ on the right side and 2+ on the left side.      Achilles reflexes are 2+ on the right side and 2+ on the left side.    Comments: Gait not assessed Speech is clear, and fluent Perrl, full eom Tongue and uvula midline Symmetric facial movements Hearing intact to voice bilaterally   Psychiatric:        Mood and Affect: Mood normal.        Behavior: Behavior normal.        Thought Content: Thought content normal.        Judgment: Judgment normal.   Erin Donaldson is a 69 y.o. female whom fell today. She has a normal neurological exam. To be admitted for observation. No operative indications at this time. Will repeat scan in the AM

## 2020-07-07 NOTE — ED Notes (Signed)
Patient transported to CT Room 3

## 2020-07-07 NOTE — ED Notes (Signed)
Critical value of k-2.7 reported to [facility]

## 2020-07-08 ENCOUNTER — Inpatient Hospital Stay (HOSPITAL_COMMUNITY): Payer: Medicare Other

## 2020-07-08 ENCOUNTER — Encounter (HOSPITAL_COMMUNITY): Payer: Self-pay

## 2020-07-08 LAB — MRSA PCR SCREENING: MRSA by PCR: POSITIVE — AB

## 2020-07-08 MED ORDER — CHLORHEXIDINE GLUCONATE 0.12 % MT SOLN
15.0000 mL | Freq: Two times a day (BID) | OROMUCOSAL | Status: DC
  Administered 2020-07-08 – 2020-07-09 (×3): 15 mL via OROMUCOSAL
  Filled 2020-07-08 (×2): qty 15

## 2020-07-08 MED ORDER — MUPIROCIN 2 % EX OINT
1.0000 "application " | TOPICAL_OINTMENT | Freq: Two times a day (BID) | CUTANEOUS | Status: DC
  Administered 2020-07-09: 1 via NASAL
  Filled 2020-07-08: qty 22

## 2020-07-08 MED ORDER — CHLORHEXIDINE GLUCONATE CLOTH 2 % EX PADS: 6.0000 | MEDICATED_PAD | Freq: Every day | CUTANEOUS | Status: DC

## 2020-07-08 MED ORDER — ACETAMINOPHEN-CODEINE #3 300-30 MG PO TABS: 1.0000 | ORAL_TABLET | Freq: Four times a day (QID) | ORAL | 0 refills | Status: DC | PRN

## 2020-07-08 MED ORDER — CHLORHEXIDINE GLUCONATE CLOTH 2 % EX PADS
6.0000 | MEDICATED_PAD | Freq: Every day | CUTANEOUS | Status: DC
  Administered 2020-07-08 – 2020-07-09 (×2): 6 via TOPICAL

## 2020-07-08 NOTE — Evaluation (Signed)
Occupational Therapy Evaluation Patient Details Name: Erin Donaldson MRN: [id number] DOB: 06/23/1951 Today's Date: 07/08/2020    History of Present Illness Pt is a 69 y/o female with PMH of HTN, bipolar disorder, DM, IBS presenting after a fall (lost her balance and fell backwards hitting her head). REports neck and R shoulder pain, xrays negative. Head CT reveals bilateral acute subdural hematomas, Larger on the L with very little midline shift.    Clinical Impression   PTA patient reports independent with ADLs, mobility using cane, and IADLs, caring for her 67 y/o grandson.  Patient does endorse increased instability over the last 2 months and reports her sister just purchasing a rollator for patient to begin using. Patient currently admitted for above and limited by problem list below, including generalized weakness, impaired balance and decreased activity tolerance.  Reports decreased STM and attention since covid in Jan 2021, but reports it is improving.  Completing bed mobility with min guard to mod assist, transfers with min guard assist, and ADLs with min assist to min guard assist.  Noted 1 LOB without UE support in standing, and pt relying on BUE support. Session limited by hypotension, see below for details; initally reports dizziness but reports it fades while upright.  Encouraged patient to sit upright as tolerated, pt agreeable.   Believe patient will benefit from further OT services while admitted and after dc at [facility] level, given initial 24/7 support, in order to optimize independence and safety with ADls, IADLs and mobility.   BP supine 108/73 (85)        EOB      87/58 (68)       EOB x 2 min 113/72 ( 85)        Standing 89/66 (71)        Standing x 3 min 89/66 (73)        Rest, minimal mobility using RW 74/43 (68)        Supine 127/71 (89)     Follow Up Recommendations  Home health OT;Supervision/Assistance - 24 hour    Equipment Recommendations  Tub/shower seat     Recommendations for Other Services PT consult     Precautions / Restrictions Precautions Precautions: Fall Restrictions Weight Bearing Restrictions: No      Mobility Bed Mobility Overal bed mobility: Needs Assistance Bed Mobility: Supine to Sit;Sit to Supine     Supine to sit: Mod assist Sit to supine: Min guard   General bed mobility comments: mod assist to bring trunk up from EOB, min guard to return supine   Transfers Overall transfer level: Needs assistance Equipment used: Rolling walker (2 wheeled) Transfers: Sit to/from Stand Sit to Stand: Min guard         General transfer comment: for safety/balance     Balance Overall balance assessment: Needs assistance Sitting-balance support: No upper extremity supported;Feet supported Sitting balance-Leahy Scale: Good     Standing balance support: Bilateral upper extremity supported;No upper extremity supported;During functional activity Standing balance-Leahy Scale: Poor Standing balance comment: patient with 1 LOB without UE support at EOB, relies on BUE support                            ADL either performed or assessed with clinical judgement   ADL Overall ADL's : Needs assistance/impaired     Grooming: Set up;Sitting   Upper Body Bathing: Set up;Sitting   Lower Body Bathing: Minimal assistance;Sit to/from stand   Upper  Body Dressing : Set up;Sitting   Lower Body Dressing: Sit to/from stand;Minimal assistance Lower Body Dressing Details (indicate cue type and reason): to manage socks, min guard sit to stand  Toilet Transfer: Min guard;Ambulation;RW           Functional mobility during ADLs: Min guard;Rolling walker;Cueing for safety General ADL Comments: pt limited by orthostatic hypotension, decreased activity tolerance      Vision Baseline Vision/History: Wears glasses Wears Glasses: At all times Patient Visual Report: No change from baseline Vision Assessment?: No apparent visual  deficits     Perception     Praxis      Pertinent Vitals/Pain Pain Assessment: Faces Faces Pain Scale: Hurts little more Pain Location: R shoulder with movement  Pain Descriptors / Indicators: Discomfort Pain Intervention(s): Limited activity within patient's tolerance;Monitored during session;Repositioned     Hand Dominance     Extremity/Trunk Assessment Upper Extremity Assessment Upper Extremity Assessment: Overall WFL for tasks assessed (R shoulder limited due to pain )   Lower Extremity Assessment Lower Extremity Assessment: Defer to PT evaluation       Communication Communication Communication: No difficulties   Cognition Arousal/Alertness: Awake/alert Behavior During Therapy: WFL for tasks assessed/performed Overall Cognitive Status: Within Functional Limits for tasks assessed                                 General Comments: reports "covid fog" since covid in Jan 2021, with decreased STM and difficulty attending but appears [facility]    General Comments  sister present and supportive; RN notified of hypotension with mobilization    Exercises     Shoulder Instructions      Home Living Family/patient expects to be discharged to:: Private residence Living Arrangements: Alone;Children (5 y/o grandson) Available Help at Discharge: Family;Available 24 hours/day Type of Home: House Home Access: Stairs to enter [employer] of Steps: 1   Home Layout: One level     Bathroom Shower/Tub: Chief Strategy Officer: Standard     Home Equipment: Environmental consultant - 4 wheels;Cane - single point   Additional Comments: sister in law plans to stay to assist patient as needed      Prior Functioning/Environment Level of Independence: Independent with assistive device(s)        Comments: driving, caring for 77 y/o grandson; reports using cane PTA         OT Problem List: Decreased activity tolerance;Impaired balance (sitting and/or  standing);Decreased knowledge of use of DME or AE;Decreased safety awareness;Decreased knowledge of precautions      OT Treatment/Interventions: Self-care/ADL training;DME and/or AE instruction;Energy conservation;Therapeutic activities;Patient/family education;Balance training    OT Goals(Current goals can be found in the care plan section) Acute Rehab OT Goals Patient Stated Goal: to get home  OT Goal Formulation: With patient Time For Goal Achievement: 07/22/20 Potential to Achieve Goals: Good  OT Frequency: Min 2X/week   Barriers to D/C:            Co-evaluation              AM-PAC OT "6 Clicks" Daily Activity     Outcome Measure Help from another person eating meals?: None Help from another person taking care of personal grooming?: A Little Help from another person toileting, which includes using toliet, bedpan, or urinal?: A Little Help from another person bathing (including washing, rinsing, drying)?: A Little Help from another person to put on and taking  off regular upper body clothing?: A Little Help from another person to put on and taking off regular lower body clothing?: A Little 6 Click Score: 19   End of Session Equipment Utilized During Treatment: Gait belt;Rolling walker Nurse Communication: Mobility status;Other (comment) (hypotension )  Activity Tolerance: Treatment limited secondary to medical complications (Comment) (orthostatic hypotension) Patient left: in bed;with call bell/phone within reach;with family/visitor present;with SCD's reapplied  OT Visit Diagnosis: Unsteadiness on feet (R26.81)                Time: 9563-8756 OT Time Calculation (min): 32 min Charges:  OT General Charges $OT Visit: 1 Visit OT Evaluation $OT Eval Moderate Complexity: 1 Mod OT Treatments $Self Care/Home Management : 8-22 mins  Barry Brunner, OT Acute Rehabilitation Services Pager [phone] Office [phone]   Chancy Milroy 07/08/2020, 3:03 PM

## 2020-07-08 NOTE — ED Notes (Signed)
Report called to 4n 

## 2020-07-08 NOTE — Discharge Instructions (Signed)
Call the office if the headaches become worse, or do not go away Call the office if your mental status changes Call the office for any neurological change

## 2020-07-08 NOTE — Progress Notes (Signed)
Patient ID: Erin Donaldson, female   DOB: Apr 19, 1951, 69 y.o.   MRN: [id number] BP (!) 144/86   Pulse 97   Temp 98.3 F (36.8 C) (Oral)   Resp 17   Ht 5\' 4"  (1.626 m)   Wt 67.1 kg   SpO2 96%   BMI 25.40 kg/m  Planned on discharging her today, however PT could not sign off. Erin Donaldson was reluctant after her pt session. Will continue pt until she is doing better. Alert and oriented x 4, speech is clear and fluent Moving all extremities well Repeat ct slightly improved

## 2020-07-08 NOTE — Evaluation (Signed)
Physical Therapy Evaluation Patient Details Name: Aslan Montagna MRN: [id number] DOB: 09/25/1951 Today's Date: 07/08/2020   History of Present Illness  Pt is a 69 y/o female with PMH of HTN, bipolar disorder, DM, IBS presenting after a fall (lost her balance and fell backwards hitting her head). REports neck and R shoulder pain, xrays negative. Head CT reveals bilateral acute subdural hematomas, Larger on the L with very little midline shift and non displaced occipital skull fx.  Clinical Impression  Pt admitted with/for fall, skull fx with bil SDH's.  Pt moving generally well at min guard level, but showing some mildly symptomatic orthostatics.  Pt currently limited functionally due to the problems listed below.  (see problems list.)  Pt will benefit from PT to maximize function and safety to be able to get home safely with available assist.     Follow Up Recommendations Home health PT;Other (comment);Outpatient PT (vestibular therapist)    Equipment Recommendations  None recommended by PT    Recommendations for Other Services       Precautions / Restrictions Precautions Precautions: Fall      Mobility  Bed Mobility Overal bed mobility: Needs Assistance Bed Mobility: Sit to Supine;Sidelying to Sit   Sidelying to sit: Min guard   Sit to supine: Min guard   General bed mobility comments: With bed flat pt was able to get up to EOB and down into bed without assist.  However on the last transition down into supine, pt started with a significant episode of torsional nystagmus.  Transfers Overall transfer level: Needs assistance Equipment used: Rolling walker (2 wheeled) Transfers: Sit to/from Stand Sit to Stand: Min guard         General transfer comment: for safety/balance   Ambulation/Gait Ambulation/Gait assistance: Min guard Gait Distance (Feet): 15 Feet (to bathroom then 100 feet into the hall) Assistive device: Rolling walker (2 wheeled) Gait Pattern/deviations:  Step-through pattern   Gait velocity interpretation: <1.8 ft/sec, indicate of risk for recurrent falls General Gait Details: generally steady in the RW on level surface with some scanning, minor light-headedness (likely from lower BP <100 systolic  Stairs            Wheelchair Mobility    Modified Rankin (Stroke Patients Only)       Balance Overall balance assessment: Needs assistance Sitting-balance support: No upper extremity supported;Feet supported Sitting balance-Leahy Scale: Good     Standing balance support: Bilateral upper extremity supported;No upper extremity supported;During functional activity Standing balance-Leahy Scale: Poor Standing balance comment: pt did not lose balance in PT evaluation, but still was reliant on the RW                             Pertinent Vitals/Pain Pain Assessment: Faces Faces Pain Scale: Hurts little more Pain Location: R shoulder with movement  Pain Descriptors / Indicators: Discomfort Pain Intervention(s): Limited activity within patient's tolerance    Home Living Family/patient expects to be discharged to:: Private residence Living Arrangements: Alone;Children Available Help at Discharge: Family;Available 24 hours/day Type of Home: House Home Access: Stairs to enter   [employer] of Steps: 1 Home Layout: One level Home Equipment: Walker - 4 wheels;Cane - single point Additional Comments: sister in law plans to stay to assist patient as needed    Prior Function Level of Independence: Independent with assistive device(s)         Comments: driving, caring for 39 y/o grandson; reports using cane  PTA      Hand Dominance        Extremity/Trunk Assessment   Upper Extremity Assessment Upper Extremity Assessment: Overall WFL for tasks assessed    Lower Extremity Assessment Lower Extremity Assessment: Overall WFL for tasks assessed       Communication   Communication: No difficulties   Cognition Arousal/Alertness: Awake/alert Behavior During Therapy: WFL for tasks assessed/performed Overall Cognitive Status: Within Functional Limits for tasks assessed                                 General Comments: reports "covid fog" since covid in Jan 2021, with decreased STM and difficulty attending but appears [facility]       General Comments General comments (skin integrity, edema, etc.): Toward the end of the evaluation, this pt return without assist to supine, but harder than she intended.  After that point, pt started experiencing significant vertigo with spinning and nystagmus.  Completed a very basic vestibular assessment focused on BPPV.  Conducted Hallpike-Dix to check what I suspected to be the non involved side and pt experienced significant torsional BPPV.  Epply used to treat for L post. BPPV.  Testing the R for Post BPPV produced a similar intense, but slight longer response of nystagmus with 2-3 secs latency.  This side too was treated with Epply, but not with quite the same preciseness of the L side.  Upon laying on the pillow at 20* in supine, pt again showed nystagmus for a short less intense period.  Will have a colleague re-assess next visit and treat as necessary.    Exercises     Assessment/Plan    PT Assessment    PT Problem List         PT Treatment Interventions      PT Goals (Current goals can be found in the Care Plan section)  Acute Rehab PT Goals Patient Stated Goal: to get home  PT Goal Formulation: With patient Time For Goal Achievement: 07/10/20 Potential to Achieve Goals: Good    Frequency Min 3X/week   Barriers to discharge        Co-evaluation               AM-PAC PT "6 Clicks" Mobility  Outcome Measure Help needed turning from your back to your side while in a flat bed without using bedrails?: A Little Help needed moving from lying on your back to sitting on the side of a flat bed without using bedrails?: A  Little Help needed moving to and from a bed to a chair (including a wheelchair)?: A Little Help needed standing up from a chair using your arms (e.g., wheelchair or bedside chair)?: A Little Help needed to walk in hospital room?: A Little Help needed climbing 3-5 steps with a railing? : A Little 6 Click Score: 18    End of Session   Activity Tolerance: Patient tolerated treatment well;Other (comment) (limited by vertigo) Patient left: in bed;with call bell/phone within reach Nurse Communication: Mobility status PT Visit Diagnosis: Unsteadiness on feet (R26.81);Other (comment) (vertigo)    Time: 3016-0109 PT Time Calculation (min) (ACUTE ONLY): 67 min   Charges:   PT Evaluation $PT Eval Moderate Complexity: 1 Mod PT Treatments $Gait Training: 8-22 mins $Therapeutic Activity: 8-22 mins $Neuromuscular Re-education: 8-22 mins        07/08/2020  Jacinto Halim., PT Acute Rehabilitation Services [phone]  (pager) [phone]  (office)  [name] Cheo Selvey 07/08/2020, 6:16 PM

## 2020-07-09 ENCOUNTER — Telehealth: Payer: Self-pay | Admitting: *Deleted

## 2020-07-09 ENCOUNTER — Ambulatory Visit: Payer: Medicare Other | Admitting: Family Medicine

## 2020-07-09 DIAGNOSIS — E119 Type 2 diabetes mellitus without complications: Secondary | ICD-10-CM | POA: Diagnosis present

## 2020-07-09 DIAGNOSIS — Z23 Encounter for immunization: Secondary | ICD-10-CM | POA: Diagnosis present

## 2020-07-09 DIAGNOSIS — M542 Cervicalgia: Secondary | ICD-10-CM | POA: Diagnosis present

## 2020-07-09 DIAGNOSIS — W010XXA Fall on same level from slipping, tripping and stumbling without subsequent striking against object, initial encounter: Secondary | ICD-10-CM | POA: Diagnosis present

## 2020-07-09 DIAGNOSIS — M25511 Pain in right shoulder: Secondary | ICD-10-CM | POA: Diagnosis present

## 2020-07-09 DIAGNOSIS — S065X0A Traumatic subdural hemorrhage without loss of consciousness, initial encounter: Secondary | ICD-10-CM | POA: Diagnosis present

## 2020-07-09 DIAGNOSIS — R402362 Coma scale, best motor response, obeys commands, at arrival to emergency department: Secondary | ICD-10-CM | POA: Diagnosis present

## 2020-07-09 DIAGNOSIS — F319 Bipolar disorder, unspecified: Secondary | ICD-10-CM | POA: Diagnosis present

## 2020-07-09 DIAGNOSIS — E876 Hypokalemia: Secondary | ICD-10-CM | POA: Diagnosis present

## 2020-07-09 DIAGNOSIS — I10 Essential (primary) hypertension: Secondary | ICD-10-CM | POA: Diagnosis present

## 2020-07-09 DIAGNOSIS — R402252 Coma scale, best verbal response, oriented, at arrival to emergency department: Secondary | ICD-10-CM | POA: Diagnosis present

## 2020-07-09 DIAGNOSIS — S02119A Unspecified fracture of occiput, initial encounter for closed fracture: Secondary | ICD-10-CM | POA: Diagnosis present

## 2020-07-09 DIAGNOSIS — Z9181 History of falling: Secondary | ICD-10-CM | POA: Diagnosis not present

## 2020-07-09 DIAGNOSIS — Z20822 Contact with and (suspected) exposure to covid-19: Secondary | ICD-10-CM | POA: Diagnosis present

## 2020-07-09 DIAGNOSIS — R296 Repeated falls: Secondary | ICD-10-CM | POA: Diagnosis present

## 2020-07-09 DIAGNOSIS — Z888 Allergy status to other drugs, medicaments and biological substances status: Secondary | ICD-10-CM | POA: Diagnosis not present

## 2020-07-09 DIAGNOSIS — R402142 Coma scale, eyes open, spontaneous, at arrival to emergency department: Secondary | ICD-10-CM | POA: Diagnosis present

## 2020-07-09 DIAGNOSIS — D649 Anemia, unspecified: Secondary | ICD-10-CM | POA: Diagnosis present

## 2020-07-09 DIAGNOSIS — E785 Hyperlipidemia, unspecified: Secondary | ICD-10-CM | POA: Diagnosis present

## 2020-07-09 DIAGNOSIS — R079 Chest pain, unspecified: Secondary | ICD-10-CM | POA: Diagnosis present

## 2020-07-09 DIAGNOSIS — S0101XA Laceration without foreign body of scalp, initial encounter: Secondary | ICD-10-CM | POA: Diagnosis present

## 2020-07-09 DIAGNOSIS — B379 Candidiasis, unspecified: Secondary | ICD-10-CM | POA: Diagnosis present

## 2020-07-09 DIAGNOSIS — Y9301 Activity, walking, marching and hiking: Secondary | ICD-10-CM | POA: Diagnosis present

## 2020-07-09 LAB — BASIC METABOLIC PANEL
Anion gap: 6 (ref 5–15)
BUN: 7 mg/dL — ABNORMAL LOW (ref 8–23)
CO2: 21 mmol/L — ABNORMAL LOW (ref 22–32)
Calcium: 8.4 mg/dL — ABNORMAL LOW (ref 8.9–10.3)
Chloride: 115 mmol/L — ABNORMAL HIGH (ref 98–111)
Creatinine, Ser: 0.97 mg/dL (ref 0.44–1.00)
GFR calc Af Amer: >60 mL/min (ref >60)
GFR calc non Af Amer: >60 mL/min (ref >60)
Glucose, Bld: 155 mg/dL — ABNORMAL HIGH (ref 70–99)
Potassium: 4 mmol/L (ref 3.5–5.1)
Sodium: 142 mmol/L (ref 135–145)

## 2020-07-09 MED ORDER — ASCORBIC ACID 500 MG PO TABS
500.0000 mg | ORAL_TABLET | Freq: Every day | ORAL | Status: DC
  Administered 2020-07-09 – 2020-07-10 (×2): 1000 mg via ORAL
  Filled 2020-07-09 (×2): qty 2

## 2020-07-09 MED ORDER — NYSTATIN 100000 UNIT/ML MT SUSP
5.0000 mL | Freq: Four times a day (QID) | OROMUCOSAL | Status: DC
  Administered 2020-07-09 – 2020-07-10 (×5): 500000 [IU] via ORAL
  Filled 2020-07-09 (×5): qty 5

## 2020-07-09 MED ORDER — LAMOTRIGINE 100 MG PO TABS
200.0000 mg | ORAL_TABLET | Freq: Every morning | ORAL | Status: DC
  Administered 2020-07-10: 200 mg via ORAL
  Filled 2020-07-09: qty 2

## 2020-07-09 MED ORDER — FUROSEMIDE 20 MG PO TABS
20.0000 mg | ORAL_TABLET | Freq: Every morning | ORAL | Status: DC
  Administered 2020-07-10: 20 mg via ORAL
  Filled 2020-07-09: qty 1

## 2020-07-09 MED ORDER — PANTOPRAZOLE SODIUM 40 MG PO TBEC
40.0000 mg | DELAYED_RELEASE_TABLET | Freq: Every day | ORAL | Status: DC
  Administered 2020-07-10: 40 mg via ORAL
  Filled 2020-07-09: qty 1

## 2020-07-09 MED ORDER — GABAPENTIN 300 MG PO CAPS
900.0000 mg | ORAL_CAPSULE | Freq: Every day | ORAL | Status: DC
  Administered 2020-07-09: 900 mg via ORAL
  Filled 2020-07-09: qty 3

## 2020-07-09 MED ORDER — DULOXETINE HCL 60 MG PO CPEP
60.0000 mg | ORAL_CAPSULE | Freq: Every morning | ORAL | Status: DC
  Administered 2020-07-10: 60 mg via ORAL
  Filled 2020-07-09: qty 1

## 2020-07-09 MED ORDER — CYCLOBENZAPRINE HCL 10 MG PO TABS: 5.0000 mg | ORAL_TABLET | Freq: Two times a day (BID) | ORAL | Status: DC | PRN

## 2020-07-09 MED ORDER — GABAPENTIN 300 MG PO CAPS: 900.0000 mg | ORAL_CAPSULE | Freq: Every day | ORAL | Status: DC | PRN

## 2020-07-09 MED ORDER — MECLIZINE HCL 12.5 MG PO TABS: 25.0000 mg | ORAL_TABLET | Freq: Three times a day (TID) | ORAL | Status: DC | PRN

## 2020-07-09 MED ORDER — PANTOPRAZOLE SODIUM 40 MG PO TBEC: 40.0000 mg | DELAYED_RELEASE_TABLET | Freq: Every day | ORAL | Status: DC | PRN

## 2020-07-09 MED ORDER — DONEPEZIL HCL 10 MG PO TABS
10.0000 mg | ORAL_TABLET | Freq: Every morning | ORAL | Status: DC
  Administered 2020-07-10: 10 mg via ORAL
  Filled 2020-07-09: qty 1

## 2020-07-09 MED ORDER — QUETIAPINE FUMARATE 200 MG PO TABS
800.0000 mg | ORAL_TABLET | Freq: Every day | ORAL | Status: DC
  Administered 2020-07-09: 800 mg via ORAL
  Filled 2020-07-09: qty 4

## 2020-07-09 MED ORDER — QUETIAPINE FUMARATE 200 MG PO TABS: 200.0000 mg | ORAL_TABLET | Freq: Every day | ORAL | Status: DC | PRN

## 2020-07-09 MED ORDER — FLUTICASONE PROPIONATE 50 MCG/ACT NA SUSP
2.0000 | Freq: Every day | NASAL | Status: DC | PRN
  Filled 2020-07-09: qty 16

## 2020-07-09 NOTE — Telephone Encounter (Signed)
Pt scheduled for TOC on 07/16/20 at 12:00 with Je, pt will be discharged later today so we just need to do the [facility] call.

## 2020-07-09 NOTE — Progress Notes (Signed)
Physical Therapy Treatment Patient Details Name: Erin Donaldson MRN: [id number] DOB: 1951/11/14 Today's Date: 07/09/2020    History of Present Illness Pt is a 69 y/o female with PMH of HTN, bipolar disorder, DM, IBS presenting after a fall (lost her balance and fell backwards hitting her head). REports neck and R shoulder pain, xrays negative. Head CT reveals bilateral acute subdural hematomas, Larger on the L with very little midline shift and non displaced occipital skull fx.    PT Comments    Pt able to mobilize safely with the use of the RW and min guard assist.  She will have supervision at home at discharge and will need a RW and follow up therapy.  I believe her vestibular symptoms to be central in nature (posterior pressure and occipital skull fx) and gave her x1 exercises for habituation.  She would benefit from being followed by a vestibular therapist at home if possible to make sure when her central symptoms start to clear that she doesn't have any underlying peripheral symptoms that made her fall in the first place (she report h/o falls for no real reason except "I am pulled over" and usually to the right).   Pt is safe to d/c home with follow up therapy and a RW when MD deems her medically appropriate.  PT will continue to follow acutely for safe mobility progression.  Follow Up Recommendations  Home health PT (vestibular therapist if able)     Equipment Recommendations  Rolling walker with 5" wheels    Recommendations for Other Services       Precautions / Restrictions Precautions Precautions: Fall Precaution Comments: h/o falls    Mobility  Bed Mobility Overal bed mobility: Needs Assistance Bed Mobility: Sit to Supine   Sidelying to sit: Supervision       General bed mobility comments: supervision for safety  Transfers Overall transfer level: Needs assistance Equipment used: Rolling walker (2 wheeled) Transfers: Sit to/from Stand Sit to Stand: Min guard          General transfer comment: Min guard assist for safety and balance during transitions.   Ambulation/Gait Ambulation/Gait assistance: Min guard Gait Distance (Feet): 10 Feet Assistive device: Rolling walker (2 wheeled) Gait Pattern/deviations: Step-through pattern     General Gait Details: Pt moving slowly, but generally steady with RW use. I would not advise going back to her cane yet.    Stairs             Wheelchair Mobility    Modified Rankin (Stroke Patients Only)       Balance Overall balance assessment: Needs assistance Sitting-balance support: Feet supported;No upper extremity supported Sitting balance-Leahy Scale: Good     Standing balance support: Bilateral upper extremity supported Standing balance-Leahy Scale: Poor Standing balance comment: needs external support from RW in standing.                             Cognition Arousal/Alertness: Awake/alert Behavior During Therapy: WFL for tasks assessed/performed Overall Cognitive Status: Within Functional Limits for tasks assessed                                        Exercises Other Exercises Other Exercises: gave x1 exercises for habituation    General Comments        Pertinent Vitals/Pain Pain Assessment: Faces Faces Pain Scale: Hurts  even more Pain Location: posterior head Pain Descriptors / Indicators: Discomfort Pain Intervention(s): Limited activity within patient's tolerance;Monitored during session;Repositioned    Home Living                      Prior Function            PT Goals (current goals can now be found in the care plan section) Acute Rehab PT Goals Patient Stated Goal: to get home  Progress towards PT goals: Progressing toward goals    Frequency    Min 3X/week      PT Plan Current plan remains appropriate    Co-evaluation              AM-PAC PT "6 Clicks" Mobility   Outcome Measure  Help needed turning from  your back to your side while in a flat bed without using bedrails?: A Little Help needed moving from lying on your back to sitting on the side of a flat bed without using bedrails?: A Little Help needed moving to and from a bed to a chair (including a wheelchair)?: A Little Help needed standing up from a chair using your arms (e.g., wheelchair or bedside chair)?: A Little Help needed to walk in hospital room?: A Little Help needed climbing 3-5 steps with a railing? : A Little 6 Click Score: 18    End of Session   Activity Tolerance: Patient limited by pain Patient left: in bed;with call bell/phone within reach;with family/visitor present Nurse Communication: Mobility status PT Visit Diagnosis: Unsteadiness on feet (R26.81);Other symptoms and signs involving the nervous system (R29.898);Dizziness and giddiness (R42)     Time: 0160-1093 PT Time Calculation (min) (ACUTE ONLY): 42 min  Charges:  $Therapeutic Exercise: 8-22 mins $Therapeutic Activity: 23-37 mins                     Corinna Capra, PT, DPT  Acute Rehabilitation [phone] pager [phone]) [phone] office

## 2020-07-09 NOTE — Progress Notes (Signed)
Occupational Therapy Treatment Patient Details Name: Erin Donaldson MRN: [id number] DOB: [birth year] Today's Date: 07/09/2020    History of present illness Pt is a 69 y/o female with PMH of HTN, bipolar disorder, DM, IBS presenting after a fall (lost her balance and fell backwards hitting her head). REports neck and R shoulder pain, xrays negative. Head CT reveals bilateral acute subdural hematomas, Larger on the L with very little midline shift and non displaced occipital skull fx.   OT comments  Patient continues to make steady progress towards goals in skilled OT session. Patient's session encompassed functional transfers and basic ADLs in order to assess if able to go home. Pts BP remained stable in session, with minimal dizziness reported by pt at EOB but resolved quickly. Pt noted to have one loss of balance to the R when ambulating to the bathroom, however able to correct with RW. Pt able to ambulate and transfer to toilet with min A due to line management. Discharge remains appropriate at this time; will continue to follow acutely.  BP sitting EOB: 114/93 BP standing: 103/76   Follow Up Recommendations  Home health OT;Supervision/Assistance - 24 hour    Equipment Recommendations  Tub/shower seat    Recommendations for Other Services      Precautions / Restrictions Precautions Precautions: Fall Precaution Comments: h/o falls Restrictions Weight Bearing Restrictions: No       Mobility Bed Mobility Overal bed mobility: Needs Assistance Bed Mobility: Sit to Supine;Sidelying to Sit   Sidelying to sit: Supervision       General bed mobility comments: supervision for safety  Transfers Overall transfer level: Needs assistance Equipment used: Rolling walker (2 wheeled) Transfers: Sit to/from Stand Sit to Stand: Min guard;Min assist         General transfer comment: Min guard assist for safety and balance during transitions. Min A to abmulate to bathroom after LOB to  the R but able to correct with RW    Balance Overall balance assessment: Needs assistance Sitting-balance support: Feet supported;No upper extremity supported Sitting balance-Leahy Scale: Good     Standing balance support: Bilateral upper extremity supported Standing balance-Leahy Scale: Poor Standing balance comment: needs external support from RW in standing.                            ADL either performed or assessed with clinical judgement   ADL Overall ADL's : Needs assistance/impaired                         Toilet Transfer: Ambulation;RW;Cueing for safety;Cueing for sequencing;Regular Toilet;Grab bars;Minimal assistance Toilet Transfer Details (indicate cue type and reason): min A for line management Toileting- Clothing Manipulation and Hygiene: Total assistance;Sit to/from stand Toileting - Clothing Manipulation Details (indicate cue type and reason): Pt unwilling to attempt to complete peri-care due to instability in standing, had her sister assist her when she went to the bathroom  earlier     Functional mobility during ADLs: Min guard;Rolling walker;Cueing for safety;Minimal assistance General ADL Comments: Pt limited by decreased activity tolerance and incoordination with sudden movement     Vision       Perception     Praxis      Cognition Arousal/Alertness: Awake/alert Behavior During Therapy: WFL for tasks assessed/performed Overall Cognitive Status: Within Functional Limits for tasks assessed  General Comments: WFL, however could not comprehend question of where she was, and had to be provided a choice, kept stating phrases like, " were in [place], its a new building, or nuerology" instead of a hospital        Exercises Exercises: Other exercises Other Exercises Other Exercises: gave x1 exercises for habituation   Shoulder Instructions       General Comments      Pertinent  Vitals/ Pain       Pain Assessment: Faces Faces Pain Scale: Hurts a little bit Pain Location: posterior head Pain Descriptors / Indicators: Discomfort Pain Intervention(s): Limited activity within patient's tolerance;Monitored during session;Repositioned  Home Living                                          Prior Functioning/Environment              Frequency  Min 2X/week        Progress Toward Goals  OT Goals(current goals can now be found in the care plan section)  Progress towards OT goals: Progressing toward goals  Acute Rehab OT Goals Patient Stated Goal: to get home  OT Goal Formulation: With patient Time For Goal Achievement: 07/22/20 Potential to Achieve Goals: Good  Plan Discharge plan remains appropriate    Co-evaluation                 AM-PAC OT "6 Clicks" Daily Activity     Outcome Measure   Help from another person eating meals?: None Help from another person taking care of personal grooming?: A Little Help from another person toileting, which includes using toliet, bedpan, or urinal?: A Little Help from another person bathing (including washing, rinsing, drying)?: A Little Help from another person to put on and taking off regular upper body clothing?: A Little Help from another person to put on and taking off regular lower body clothing?: A Little 6 Click Score: 19    End of Session Equipment Utilized During Treatment: Rolling walker  OT Visit Diagnosis: Unsteadiness on feet (R26.81)   Activity Tolerance Patient tolerated treatment well;Patient limited by fatigue   Patient Left in bed;with call bell/phone within reach;with family/visitor present   Nurse Communication Mobility status;Other (comment) (Sitting on side of bed, one LOB noted but able to correct)        Time: 0932-3557 OT Time Calculation (min): 28 min  Charges: OT General Charges $OT Visit: 1 Visit OT Treatments $Self Care/Home Management : 23-37  mins  Pollyann Glen E. Arwin Bisceglia, COTA/L Acute Rehabilitation Services [phone]  Cherlyn Cushing 07/09/2020, 2:26 PM

## 2020-07-09 NOTE — TOC Transition Note (Signed)
Transition of Care [facility]) - CM/SW Discharge Note   Patient Details  Name: Erin Donaldson MRN: [id number] Date of Birth: 1951-02-19  Transition of Care [facility]) CM/SW Contact:  Glennon Mac, RN Phone Number: 07/09/2020, 2:32 PM   Clinical Narrative:  Pt admitted on 07/07/20 s/p fall with bilateral acute SDH.  PTA, pt independent with assistive devices; lives alone.  PT/OT recommending HH follow up, and pt agreeable to services.  She states that her sister and granddaughter can provide 24h assistance at dc.  Referral to [facility] for follow up.  Pt requests RW for home.  Referral to [employer] for DME, to be delivered to bedside prior to dc.           Final next level of care: Home w Home Health Services Barriers to Discharge: Barriers Resolved   Patient Goals and CMS Choice Patient states their goals for this hospitalization and ongoing recovery are:: to get back home CMS Medicare.gov Compare Post Acute Care list provided to:: Patient Choice offered to / list presented to : Patient                         Discharge Plan and Services   Discharge Planning Services: CM Consult Post Acute Care Choice: Home Health          DME Arranged: Walker rolling DME Agency: [employer] Date DME Agency Contacted: 07/09/20 Time DME Agency Contacted: 1426 Representative spoke with at DME Agency: texted to [employer] HH Arranged: PT, OT (Vestibular PT) HH Agency: [facility] Date [facility] Agency Contacted: 07/09/20 Time HH Agency Contacted: 1431 Representative spoke with at [facility] Agency: Lorenza Chick  Social Determinants of Health (SDOH) Interventions     Readmission Risk Interventions Readmission Risk Prevention Plan 07/09/2020  Post Dischage Appt Complete  Medication Screening Complete  Transportation Screening Complete   Quintella Baton, RN, BSN  Trauma/Neuro ICU Case Manager [phone]

## 2020-07-09 NOTE — TOC CAGE-AID Note (Signed)
Transition of Care [facility]) - CAGE-AID Screening   Patient Details  Name: Erin Donaldson MRN: [id number] Date of Birth: [birth year]  [facility]) CM/SW Contact:    Emeterio Reeve, [place] Phone Number: 07/09/2020, 3:21 PM   Clinical Narrative:  CSW met with pt at bedside. CSW introduced self and explained her role at the hospital.  PT denies alcohol use and substance use. Pt did not need any resources at this time.    CAGE-AID Screening:    Have You Ever Felt You Ought to Cut Down on Your Drinking or Drug Use?: No Have People Annoyed You By Critizing Your Drinking Or Drug Use?: No Have You Felt Bad Or Guilty About Your Drinking Or Drug Use?: No Have You Ever Had a Drink or Used Drugs First Thing In The Morning to Steady Your Nerves or to Get Rid of a Hangover?: No CAGE-AID Score: 0  Substance Abuse Education Offered: Yes     Blima Ledger, [place] Social Worker [phone]

## 2020-07-09 NOTE — Progress Notes (Signed)
Patient ID: Erin Donaldson, female   DOB: Mar 25, 1951, 69 y.o.   MRN: [id number] BP 101/69   Pulse (!) 103   Temp 98.9 F (37.2 C) (Oral)   Resp (!) 22   Ht 5\' 4"  (1.626 m)   Wt 67.1 kg   SpO2 97%   BMI 25.40 kg/m  Alert and oriented x 4, speech is clear and fluent Perrl, full eom Tongue and uvula midline Moving all extremities PT offered better assessment , home health recommended

## 2020-07-10 NOTE — Progress Notes (Signed)
Pt's IV removed; site is clean, dry and intact. Discharge instructions reviewed with pt; voiced understanding. Personal belongings were packed by pt's daughter and transported with pt. Pt transported via wheelchair by staff to private vehicle driven by daughter.

## 2020-07-10 NOTE — Progress Notes (Signed)
Physical Therapy Treatment Patient Details Name: Erin Donaldson MRN: [id number] DOB: 26-Jan-1951 Today's Date: 07/10/2020    History of Present Illness Pt is a 69 y/o female with PMH of HTN, bipolar disorder, DM, IBS presenting after a fall (lost her balance and fell backwards hitting her head). REports neck and R shoulder pain, xrays negative. Head CT reveals bilateral acute subdural hematomas, Larger on the L with very little midline shift and non displaced occipital skull fx.    PT Comments    Reinforced safety issues for transfers and use of the RW.  Both pt and dtr had no further questions after education.    Follow Up Recommendations  Home health PT     Equipment Recommendations  Rolling walker with 5" wheels    Recommendations for Other Services       Precautions / Restrictions Precautions Precautions: Fall    Mobility  Bed Mobility Overal bed mobility: Modified Independent                Transfers Overall transfer level: Needs assistance Equipment used: Rolling walker (2 wheeled) Transfers: Sit to/from Stand Sit to Stand: Supervision         General transfer comment: cued for safety--not pulling up on RW  Ambulation/Gait Ambulation/Gait assistance: Min guard Gait Distance (Feet): 200 Feet Assistive device: Rolling walker (2 wheeled) Gait Pattern/deviations: Step-through pattern   Gait velocity interpretation: <1.8 ft/sec, indicate of risk for recurrent falls General Gait Details: Generally steady with the RW.  Reinforced staying at the regular RW level until more steady with scanning and turning. Pt still list and veering L on a regular basis.   Stairs             Wheelchair Mobility    Modified Rankin (Stroke Patients Only)       Balance Overall balance assessment: Needs assistance   Sitting balance-Leahy Scale: Good     Standing balance support: No upper extremity supported;Single extremity supported;During functional  activity Standing balance-Leahy Scale: Fair Standing balance comment: optimally needs external support or AD, but stood statically without holding to AD                            Cognition Arousal/Alertness: Awake/alert Behavior During Therapy: WFL for tasks assessed/performed Overall Cognitive Status: Within Functional Limits for tasks assessed                                        Exercises      General Comments General comments (skin integrity, edema, etc.): vss      Pertinent Vitals/Pain Pain Assessment: Faces Faces Pain Scale: No hurt Pain Intervention(s): Monitored during session    Home Living                      Prior Function            PT Goals (current goals can now be found in the care plan section) Acute Rehab PT Goals Patient Stated Goal: to get home  PT Goal Formulation: With patient Time For Goal Achievement: 07/10/20 Potential to Achieve Goals: Good Progress towards PT goals: Progressing toward goals    Frequency    Min 3X/week      PT Plan Current plan remains appropriate    Co-evaluation  AM-PAC PT "6 Clicks" Mobility   Outcome Measure  Help needed turning from your back to your side while in a flat bed without using bedrails?: A Little Help needed moving from lying on your back to sitting on the side of a flat bed without using bedrails?: A Little Help needed moving to and from a bed to a chair (including a wheelchair)?: A Little Help needed standing up from a chair using your arms (e.g., wheelchair or bedside chair)?: A Little Help needed to walk in hospital room?: A Little Help needed climbing 3-5 steps with a railing? : A Little 6 Click Score: 18    End of Session   Activity Tolerance: Patient tolerated treatment well Patient left: in bed;with call bell/phone within reach;with family/visitor present Nurse Communication: Mobility status PT Visit Diagnosis: Unsteadiness on  feet (R26.81);Other symptoms and signs involving the nervous system (Z61.096)     Time: 0454-0981 PT Time Calculation (min) (ACUTE ONLY): 22 min  Charges:  $Gait Training: 8-22 mins                     07/10/2020  Jacinto Halim., PT Acute Rehabilitation Services [phone]  (pager) [phone]  (office)   Erin Donaldson Erin Donaldson 07/10/2020, 2:04 PM

## 2020-07-10 NOTE — Plan of Care (Signed)
  Problem: Nutrition: Goal: Adequate nutrition will be maintained Outcome: Progressing   Problem: Pain Managment: Goal: General experience of comfort will improve Outcome: Progressing   Problem: Safety: Goal: Ability to remain free from injury will improve Outcome: Progressing   

## 2020-07-11 ENCOUNTER — Encounter: Payer: Self-pay | Admitting: Family Medicine

## 2020-07-12 NOTE — Telephone Encounter (Signed)
    TRANSITIONAL CARE MANAGEMENT TELEPHONE OUTREACH NOTE   Contact Date: 07/12/2020 Contacted By: Josetta Huddle, LPN   DISCHARGE INFORMATION Date of Discharge:07/10/20  Discharge Facility: [facility] Discharge Diagnosis: Subdural Hematoma  Outpatient Follow Up Recommendations (copied from discharge summary) No discharge summary available at this time   Erin Donaldson is a female primary care patient of Raliegh Ip, DO. An outgoing telephone call was made today and I spoke with Erin Donaldson.  Erin Donaldson condition(s) and treatment(s) were discussed. An opportunity to ask questions was provided and all were answered or forwarded as appropriate.    ACTIVITIES OF DAILY LIVING  Erin Donaldson lives with their family and she can perform ADLs independently. her primary caregiver is Nira Retort. she is able to depend on her primary caregiver(s) for consistent help. Transportation to appointments, to pick up medications, and to run errands is not a problem.  (Consider referral to [facility] if transportation or a consistent caregiver is a problem)   Fall Risk Fall Risk  06/26/2020 06/14/2020  Falls in the past year? 1 1  Number falls in past yr: 1 1  Injury with Fall? 1 1  Risk for fall due to : History of fall(s);Impaired balance/gait;Impaired mobility History of fall(s)  Follow up Falls evaluation completed Falls evaluation completed    high Fall Risk   Home Modifications/Assistive Devices Wheelchair: No Cane: No Ramp: No Bedside Toilet: Yes Hospital Bed:  No Other: [facility] Services she is receiving home health Physical therapy and Occupational Therapy services.     MEDICATION RECONCILIATION  Erin Donaldson has been able to pick-up all prescribed discharge medications from the pharmacy.   A post discharge medication reconciliation was performed and the complete medication list was reviewed with the patient/caregiver and is current as of  07/12/2020. Changes highlighted below.  Discontinued Medications Antibiotic  Current Medication List Allergies as of 07/09/2020      Reactions   Bactrim [sulfamethoxazole-trimethoprim]    Raw and irritatated in mouth.   Darvocet [propoxyphene N-acetaminophen]    Decongestant [oxymetazoline]    Elavil [amitriptyline]    Lithium Other (See Comments)   Hx toxicity   Risperidone And Related    Ultram [tramadol]    Cephalosporins Rash, Other (See Comments)   Keflex- Higher dose cause a rash   Keflex [cephalexin] Rash   Omnicef [cefdinir] Rash      Medication List    Notice   This visit is during an admission. Changes to the med list made in this visit will be reflected in the After Visit Summary of the admission.      PATIENT EDUCATION & FOLLOW-UP PLAN  An appointment for Transitional Care Management is scheduled with Lynnell Chad, FNP  on Tuesday 07/16/20  at 1200.  Take all medications as prescribed  Contact our office by calling [phone] if you have any questions or concerns

## 2020-07-14 NOTE — Discharge Summary (Signed)
Physician Discharge Summary  Patient ID: Erin Donaldson MRN: [id number] DOB/AGE: 1951/03/21 69 y.o.  Admit date: 07/07/2020 Discharge date: 07/14/2020  Admission Diagnoses: Acute subdural hematoma left  Discharge Diagnoses: same Active Problems:   Acute subdural hematoma [facility])   Discharged Condition: good  Hospital Course: Mrs. Tassinari was admitted and observed in the ICU secondary to a fall resulting in an acute subdural hematoma. She improved with her balance and her gait during the hospitalization. She was seen and evaluated by Pt, and OT. Repeat CT scan showed no change in the subdural hematoma. Home health services were to provide at home PT, and OT.  Treatments: observation  Discharge Exam: Blood pressure (!) 141/82, pulse (!) 104, temperature 98.4 F (36.9 C), temperature source Oral, resp. rate 18, height 5\' 4"  (1.626 m), weight 67.1 kg, SpO2 100 %. General appearance: alert, cooperative and no distress Neurologic: Alert and oriented X 3, normal strength and tone. Normal symmetric reflexes. Normal coordination and gait  Disposition: Discharge disposition: 01-Home or Self Care      * No surgery found *  Allergies as of 07/10/2020      Reactions   Bactrim [sulfamethoxazole-trimethoprim]    Raw and irritatated in mouth.   Darvocet [propoxyphene N-acetaminophen]    Decongestant [oxymetazoline]    Elavil [amitriptyline]    Lithium Other (See Comments)   Hx toxicity   Risperidone And Related    Ultram [tramadol]    Cephalosporins Rash, Other (See Comments)   Keflex- Higher dose cause a rash   Keflex [cephalexin] Rash   Omnicef [cefdinir] Rash      Medication List    STOP taking these medications   amoxicillin 500 MG capsule Commonly known as: AMOXIL     TAKE these medications   acetaminophen-codeine 300-30 MG tablet Commonly known as: TYLENOL #3 Take 1 tablet by mouth every 6 (six) hours as needed for moderate pain.   ascorbic acid 500 MG  tablet Commonly known as: VITAMIN C Take 500-1,000 mg by mouth daily.   BC HEADACHE POWDER PO Take 1 packet by mouth as needed (for headaches).   cyclobenzaprine 10 MG tablet Commonly known as: FLEXERIL Take 5-10 mg by mouth 2 (two) times daily as needed for muscle spasms.   donepezil 10 MG tablet Commonly known as: ARICEPT Take 10 mg by mouth in the morning.   DULoxetine 60 MG capsule Commonly known as: CYMBALTA Take 60 mg by mouth in the morning.   fluticasone 50 MCG/ACT nasal spray Commonly known as: FLONASE Place 2 sprays into both nostrils daily as needed for allergies or rhinitis.   furosemide 20 MG tablet Commonly known as: LASIX Take 20 mg by mouth in the morning.   gabapentin 300 MG capsule Commonly known as: NEURONTIN Take 900 mg by mouth See admin instructions. Take 900 mg by mouth at bedtime and an additional 900 mg once a day as needed for restless legs or neuropathy   ibuprofen 200 MG tablet Commonly known as: ADVIL Take 800 mg by mouth every 6 (six) hours as needed for headache or mild pain.   lamoTRIgine 200 MG tablet Commonly known as: LAMICTAL Take 200 mg by mouth in the morning.   meclizine 25 MG tablet Commonly known as: ANTIVERT Take 25 mg by mouth 3 (three) times daily as needed for dizziness.   nystatin 100000 UNIT/ML suspension Commonly known as: MYCOSTATIN Take 5 mLs by mouth 4 (four) times daily. UNTIL CLEAR   pantoprazole 40 MG tablet Commonly known as:  PROTONIX Take 40 mg by mouth See admin instructions. Take 40 mg by mouth in the morning before breakfast and an additional 40 mg once a day, if no relief   QUEtiapine 400 MG tablet Commonly known as: SEROQUEL Take 800 mg by mouth at bedtime.   QUEtiapine 200 MG tablet Commonly known as: SEROQUEL Take 200 mg by mouth See admin instructions. Take 200 mg by mouth during the night as needed for sleeplessness       Follow-up Information    Coletta Memos, MD Follow up in 2 week(s).    Specialty: Neurosurgery Why: please call to make an appointment  Contact information: [address] [place] [address] [phone]        Raliegh Ip, DO. Go on 07/16/2020.   Specialty: Family Medicine Why: with NP Vonna Kotyk at 12:00pm for hospital followup Contact information: [address] [place] [address] [phone]        Care, [facility] Follow up.   Specialty: Home Health Services Why: Vestibular physical therapy and occupational therapy.  Agency will call you to arrange an appointment.  Contact information: [address] [place] [address] [phone]               Signed: Coletta Memos 07/14/2020, 4:12 PM

## 2020-07-16 ENCOUNTER — Ambulatory Visit (INDEPENDENT_AMBULATORY_CARE_PROVIDER_SITE_OTHER): Payer: Medicare Other | Admitting: Nurse Practitioner

## 2020-07-16 ENCOUNTER — Other Ambulatory Visit: Payer: Self-pay

## 2020-07-16 ENCOUNTER — Encounter: Payer: Self-pay | Admitting: Nurse Practitioner

## 2020-07-16 VITALS — BP 121/75 | HR 103 | Temp 97.5°F | Ht 64.0 in

## 2020-07-16 DIAGNOSIS — R601 Generalized edema: Secondary | ICD-10-CM | POA: Insufficient documentation

## 2020-07-16 DIAGNOSIS — J029 Acute pharyngitis, unspecified: Secondary | ICD-10-CM

## 2020-07-16 DIAGNOSIS — U071 COVID-19: Secondary | ICD-10-CM

## 2020-07-16 DIAGNOSIS — Z09 Encounter for follow-up examination after completed treatment for conditions other than malignant neoplasm: Secondary | ICD-10-CM

## 2020-07-16 MED ORDER — MECLIZINE HCL 25 MG PO TABS: 25.0000 mg | ORAL_TABLET | Freq: Three times a day (TID) | ORAL | 0 refills | Status: DC | PRN

## 2020-07-16 MED ORDER — ALBUTEROL SULFATE HFA 108 (90 BASE) MCG/ACT IN AERS: 2.0000 | INHALATION_SPRAY | Freq: Four times a day (QID) | RESPIRATORY_TRACT | 0 refills | Status: DC | PRN

## 2020-07-16 NOTE — Assessment & Plan Note (Signed)
Patient presents with edema bilateral lower extremity.  Onset of symptoms was 5 years ago.  Gradually improving since that time.  The edema is present all day.  Patient was currently admitted in the hospital and was told to stop Lasix.  Patient's current Lasix 20 mg daily, changed dose and started patient on 10 mg every other day, compression socks elevating feet above heart.  Document and keep a log of blood pressure.  Provided education with printed handout given Follow-up in 2 weeks.

## 2020-07-16 NOTE — Patient Instructions (Signed)

## 2020-07-16 NOTE — Assessment & Plan Note (Signed)
Patient is a 69 year old female who presents to clinic today for transition of care management.  Patient was discharged from [facility] on 07/10/2020 with a primary diagnosis of subdural hematoma.  Completed hospital discharge instructions, medication reconciliation and provided education to patient with printed handouts.  Patient verbalized understanding.  Physical therapy, Occupational Therapy, and home health ordered for patient on discharge. Patient reports slight weakness since discharge from ICU.  Patient almost back to baseline per caregiver patient reports a fall 07/16/2020.  Primary caregiver has a 24-hour companion in the home until home health is fully established. Medication renewed: Meclizine, albuterol.  Rx sent to pharmacy

## 2020-07-16 NOTE — Progress Notes (Signed)
Established Patient Office Visit  Subjective:  Patient ID: Erin Donaldson, female    DOB: [birth year]  Age: 69 y.o. MRN: [id number]  CC:  Chief Complaint  Patient presents with  . Transitions Of Care  . Hospitalization Follow-up  . Fall  . Dizziness    HPI Erin Donaldson presents for Today's visit was for Transitional Care Management.  The patient was discharged from [facility] on 07/10/20 with a primary diagnosis of subdural hematoma.   Contact with the patient and/or caregiver, by a clinical staff member, was made on 07/12/2020 and was documented as a telephone encounter within the EMR.  Through chart review and discussion with the patient I have determined that management of their condition is of high complexity.    Edema: Patient complains of edema. The location of the edema is feet bilateral.  The edema has been moderate.  Onset of symptoms was 5 years ago, gradually improving since that time. The edema is present all day. The patient states the problem is long-standing.  The swelling has been aggravated by nothing, relieved by diuretics, and been associated with weight gain. Cardiac risk factors include advanced age (older than 11 for men, 74 for women).    Past Medical History:  Diagnosis Date  . Anemia   . Anxiety   . Bipolar 1 disorder ([place])   . Bipolar affective ([place])   . Depression   . Diabetes in pregnancy   . Diabetes mellitus   . Diabetes mellitus without complication ([place])   . Elevated LFTs   . GERD (gastroesophageal reflux disease)   . Hyperlipidemia   . Hypertension   . IBS (irritable bowel syndrome)   . Migraine     Past Surgical History:  Procedure Laterality Date  . CHOLECYSTECTOMY    . GASTRIC BYPASS  2005    Family History  Problem Relation Age of Onset  . Kidney disease Father   . Heart disease Father 65       CABG  . Diabetes Father   . CAD Father   . Heart disease Mother 71       CAD  . Breast cancer Mother   . Cancer Mother         bladder  . Heart disease Maternal Grandfather   . Heart attack Maternal Grandfather   . Stroke Maternal Grandfather   . Heart disease Brother   . Dementia Brother   . Heart disease Maternal Aunt   . Heart disease Maternal Uncle   . Heart disease Paternal Aunt   . Heart disease Paternal Uncle   . Diabetes Maternal Aunt   . Diabetes Maternal Uncle   . Diabetes Paternal Aunt   . Diabetes Paternal Uncle   . Stroke Maternal Grandmother   . Heart disease Paternal Grandfather   . Diabetes Daughter   . Drug abuse Daughter   . Depression Sister   . Hypertension Sister   . Alcohol abuse Brother   . Drug abuse Brother   . Other Brother        accident caused brain swelling   . Colon cancer Neg Hx     Social History   Socioeconomic History  . Marital status: Widowed    Spouse name: Not on file  . Number of children: 1  . Years of education: college  . Highest education level: Not on file  Occupational History  . Occupation: disabled    Comment: [employer]   Tobacco Use  . Smoking status:  Never Smoker  . Smokeless tobacco: Never Used  Vaping Use  . Vaping Use: Never used  Substance and Sexual Activity  . Alcohol use: No    Alcohol/week: 0.0 standard drinks    Comment: rare  . Drug use: Yes    Types: [place]  . Sexual activity: Not Currently  Other Topics Concern  . Not on file  Social History Narrative   ** Merged History Encounter **       Erin Donaldson is a widow, her husband passed away in his sleep in 02/15/2012. She currently has custody of her great grandson, who is 74 years old.  She actually delivered her great-grandson at home.   Social Determinants of Health   Financial Resource Strain: Low Risk   . Difficulty of Paying Living Expenses: Not hard at all  Food Insecurity: No Food Insecurity  . Worried About Charity fundraiser in the Last Year: Never true  . Ran Out of Food in the Last Year: Never true  Transportation Needs: No Transportation Needs  . Lack of  Transportation (Medical): No  . Lack of Transportation (Non-Medical): No  Physical Activity: Inactive  . Days of Exercise per Week: 0 days  . Minutes of Exercise per Session: 0 min  Stress: Stress Concern Present  . Feeling of Stress : Very much  Social Connections: Unknown  . Frequency of Communication with Friends and Family: More than three times a week  . Frequency of Social Gatherings with Friends and Family: Once a week  . Attends Religious Services: Patient refused  . Active Member of Clubs or Organizations: No  . Attends Archivist Meetings: Never  . Marital Status: Widowed  Intimate Partner Violence: Not At Risk  . Fear of Current or Ex-Partner: No  . Emotionally Abused: No  . Physically Abused: No  . Sexually Abused: No    Outpatient Medications Prior to Visit  Medication Sig Dispense Refill  . acetaminophen-codeine (TYLENOL #3) 300-30 MG tablet Take 1 tablet by mouth every 6 (six) hours as needed for moderate pain. 30 tablet 0  . albuterol (VENTOLIN HFA) 108 (90 Base) MCG/ACT inhaler Inhale 2 puffs into the lungs every 6 (six) hours as needed for wheezing or shortness of breath. 8 g 0  . ascorbic acid (VITAMIN C) 500 MG tablet Take 500-1,000 mg by mouth daily.    . Aspirin-Salicylamide-Caffeine (BC HEADACHE POWDER PO) Take 1 packet by mouth as needed (for headaches).    . cetirizine (ZYRTEC) 10 MG tablet Take 1 tablet (10 mg total) by mouth daily. 30 tablet 11  . cyclobenzaprine (FLEXERIL) 10 MG tablet Take 0.5-1 tablets (5-10 mg total) by mouth 2 (two) times daily as needed for muscle spasms. 60 tablet 2  . donepezil (ARICEPT) 10 MG tablet TAKE 1 TABLET BY MOUTH EVERY DAY 90 tablet 0  . DULoxetine (CYMBALTA) 60 MG capsule TAKE 1 CAPSULE BY MOUTH EVERY DAY 90 capsule 1  . fluticasone (FLONASE) 50 MCG/ACT nasal spray SPRAY 2 SPRAYS INTO EACH NOSTRIL EVERY DAY 16 g 5  . fluticasone (FLONASE) 50 MCG/ACT nasal spray Place 2 sprays into both nostrils daily as needed  for allergies or rhinitis.    . furosemide (LASIX) 20 MG tablet Take 20 mg by mouth in the morning.    . gabapentin (NEURONTIN) 300 MG capsule TAKE 3 CAPSULES BY MOUTH THREE TIMES A DAY (Patient taking differently: Take 900 mg by mouth 3 (three) times daily. ) 810 capsule 1  . ibuprofen (ADVIL)  200 MG tablet Take 800 mg by mouth every 6 (six) hours as needed for headache or mild pain.    Marland Kitchen lamoTRIgine (LAMICTAL) 200 MG tablet TAKE 1 TABLET BY MOUTH EVERY DAY 90 tablet 1  . meclizine (ANTIVERT) 25 MG tablet TAKE 1 TABLET (25 MG TOTAL) BY MOUTH 3 (THREE) TIMES DAILY AS NEEDED FOR DIZZINESS. 30 tablet 0  . Multiple Vitamin (MULTIVITAMIN) LIQD Take 15 mLs by mouth daily. 473 mL 1  . nystatin cream (MYCOSTATIN) Apply topically 2 (two) times daily. 60 g 3  . pantoprazole (PROTONIX) 40 MG tablet TAKE 2 TABLETS BY MOUTH EVERY DAY 180 tablet 0  . QUEtiapine (SEROQUEL) 200 MG tablet TAKE 1 TABLET BY MOUTH EVERY DAY (Patient taking differently: 200 mg as needed. ) 90 tablet 3  . QUEtiapine (SEROQUEL) 400 MG tablet TAKE 2 TABLETS (800 MG TOTAL) BY MOUTH AT BEDTIME. 180 tablet 2  . rizatriptan (MAXALT) 10 MG tablet Take 1 tablet (10 mg total) by mouth as needed. May repeat in 2 hours if needed 10 tablet 5  . Vitamin D, Ergocalciferol, (DRISDOL) 1.25 MG (50000 UT) CAPS capsule Take 50,000 Units by mouth 2 (two) times a week.   3  . cyclobenzaprine (FLEXERIL) 10 MG tablet Take 5-10 mg by mouth 2 (two) times daily as needed for muscle spasms.     Marland Kitchen donepezil (ARICEPT) 10 MG tablet Take 10 mg by mouth in the morning.    . DULoxetine (CYMBALTA) 60 MG capsule Take 60 mg by mouth in the morning.    . gabapentin (NEURONTIN) 300 MG capsule Take 900 mg by mouth See admin instructions. Take 900 mg by mouth at bedtime and an additional 900 mg once a day as needed for restless legs or neuropathy    . lamoTRIgine (LAMICTAL) 200 MG tablet Take 200 mg by mouth in the morning.    . meclizine (ANTIVERT) 25 MG tablet Take 25 mg  by mouth 3 (three) times daily as needed for dizziness.    . nystatin (MYCOSTATIN) 100000 UNIT/ML suspension Take 5 mLs by mouth 4 (four) times daily. UNTIL CLEAR    . pantoprazole (PROTONIX) 40 MG tablet Take 40 mg by mouth See admin instructions. Take 40 mg by mouth in the morning before breakfast and an additional 40 mg once a day, if no relief    . QUEtiapine (SEROQUEL) 200 MG tablet Take 200 mg by mouth See admin instructions. Take 200 mg by mouth during the night as needed for sleeplessness    . QUEtiapine (SEROQUEL) 400 MG tablet Take 800 mg by mouth at bedtime.    Marland Kitchen amoxicillin (AMOXIL) 500 MG capsule Take 1 capsule (500 mg total) by mouth 3 (three) times daily. 21 capsule 0   No facility-administered medications prior to visit.    Allergies  Allergen Reactions  . Bactrim [Sulfamethoxazole-Trimethoprim]     Raw and irritatated in mouth.  Carlton Adam [Propoxyphene N-Acetaminophen]   . Decongestant [Oxymetazoline]   . Elavil [Amitriptyline]   . Lithium Other (See Comments)    Hx toxicity  . Risperidone And Related   . Ultram [Tramadol]   . Cephalosporins Rash and Other (See Comments)    Keflex- Higher dose cause a rash  . Keflex [Cephalexin] Rash  . Omnicef [Cefdinir] Rash    ROS Review of Systems  Constitutional: Negative.   HENT: Negative.   Eyes: Negative.   Respiratory: Negative.   Cardiovascular: Positive for leg swelling.  Gastrointestinal: Negative.   Musculoskeletal: Positive for joint  swelling.  Skin: Positive for color change.       Hematoma  Neurological: Negative for light-headedness.      Objective:    Physical Exam Vitals reviewed.  Constitutional:      Appearance: Normal appearance.  HENT:     Head: Normocephalic.  Eyes:     Conjunctiva/sclera: Conjunctivae normal.  Cardiovascular:     Rate and Rhythm: Normal rate.     Pulses: Normal pulses.     Heart sounds: Normal heart sounds.  Pulmonary:     Effort: Pulmonary effort is normal.      Breath sounds: Normal breath sounds.  Abdominal:     General: Bowel sounds are normal.  Musculoskeletal:        General: Swelling and tenderness present.  Skin:    General: Skin is warm.     Findings: Bruising present.  Neurological:     Mental Status: She is alert and oriented to person, place, and time.     BP 121/75   Pulse (!) 103   Temp (!) 97.5 F (36.4 C) (Temporal)   Ht '5\' 4"'  (1.626 m)   SpO2 92%   BMI 25.40 kg/m  Wt Readings from Last 3 Encounters:  07/07/20 148 lb (67.1 kg)  06/26/20 152 lb (68.9 kg)  06/25/20 151 lb (68.5 kg)     Health Maintenance Due  Topic Date Due  . COLONOSCOPY  04/05/2013  . DEXA SCAN  Never done  . INFLUENZA VACCINE  06/16/2020    There are no preventive care reminders to display for this patient.  Lab Results  Component Value Date   TSH 1.199 06/20/2020   Lab Results  Component Value Date   WBC 6.9 07/07/2020   HGB 10.5 (L) 07/07/2020   HCT 31.0 (L) 07/07/2020   MCV 88.8 07/07/2020   PLT 316 07/07/2020   Lab Results  Component Value Date   NA 142 07/09/2020   K 4.0 07/09/2020   CHLORIDE 109 01/24/2016   CO2 21 (L) 07/09/2020   GLUCOSE 155 (H) 07/09/2020   BUN 7 (L) 07/09/2020   CREATININE 0.97 07/09/2020   BILITOT 0.6 07/07/2020   ALKPHOS 153 (H) 07/07/2020   AST 26 07/07/2020   ALT 24 07/07/2020   PROT 5.1 (L) 07/07/2020   ALBUMIN 2.2 (L) 07/07/2020   CALCIUM 8.4 (L) 07/09/2020   ANIONGAP 6 07/09/2020   EGFR 59 (L) 01/24/2016   Lab Results  Component Value Date   CHOL 160 01/10/2019   Lab Results  Component Value Date   HDL 63 01/10/2019   Lab Results  Component Value Date   LDLCALC 83 01/10/2019   Lab Results  Component Value Date   TRIG 68 01/10/2019   Lab Results  Component Value Date   CHOLHDL 2.5 01/10/2019   Lab Results  Component Value Date   HGBA1C 6.8 06/14/2020      Assessment & Plan:   Problem List Items Addressed This Visit      Other   Generalized edema    Patient  presents with edema bilateral lower extremity.  Onset of symptoms was 5 years ago.  Gradually improving since that time.  The edema is present all day.  Patient was currently admitted in the hospital and was told to stop Lasix.  Patient's current Lasix 20 mg daily, changed dose and started patient on 10 mg every other day, compression socks elevating feet above heart.  Document and keep a log of blood pressure.  Provided education with  printed handout given Follow-up in 2 weeks.      Hospital discharge follow-up - Primary    Patient is a 69 year old female who presents to clinic today for transition of care management.  Patient was discharged from [facility] on 07/10/2020 with a primary diagnosis of subdural hematoma.  Completed hospital discharge instructions, medication reconciliation and provided education to patient with printed handouts.  Patient verbalized understanding.  Physical therapy, Occupational Therapy, and home health ordered for patient on discharge. Patient reports slight weakness since discharge from ICU.  Patient almost back to baseline per caregiver patient reports a fall 07/16/2020.  Primary caregiver has a 24-hour companion in the home until home health is fully established. Medication renewed: Meclizine, albuterol.  Rx sent to pharmacy        Other Visit Diagnoses    COVID-19 virus detected       Relevant Medications   albuterol (VENTOLIN HFA) 108 (90 Base) MCG/ACT inhaler   Acute pharyngitis, unspecified etiology       Relevant Medications   albuterol (VENTOLIN HFA) 108 (90 Base) MCG/ACT inhaler      No orders of the defined types were placed in this encounter.   Follow-up: Return if symptoms worsen or fail to improve.    Ivy Elim, NP

## 2020-07-17 ENCOUNTER — Telehealth (HOSPITAL_COMMUNITY): Payer: Self-pay | Admitting: Physical Therapy

## 2020-07-17 NOTE — Telephone Encounter (Signed)
Called to verify that patient was not recently diagnosed with Covid-19 and patient's daughter reported that no, the patient had Covid-19 in January.   Verne Carrow PT, DPT 10:27 AM, 07/17/20 [phone]

## 2020-07-17 NOTE — Telephone Encounter (Signed)
pt cancelled appt because Frances Furbish is coming to see her

## 2020-07-18 ENCOUNTER — Ambulatory Visit (HOSPITAL_COMMUNITY): Payer: Medicare Other | Admitting: Physical Therapy

## 2020-07-18 ENCOUNTER — Telehealth: Payer: Self-pay | Admitting: Family Medicine

## 2020-07-18 NOTE — Telephone Encounter (Signed)
Appointment scheduled, patient aware.

## 2020-07-18 NOTE — Telephone Encounter (Signed)
Pt called requesting that Dr Nadine Counts put in an order for her to be put on oxygen. Pt says when she saw PT last, it was recommended that she be put on oxygen. Pt thought PT said they would send over the info about it, but pt has not heard anything from anyone since then.

## 2020-07-18 NOTE — Telephone Encounter (Signed)
She needs to be qualified for O2.  Can't just randomly order it.  Please get her scheduled with someone for ambulatory O2 vitals and then can determine rx.

## 2020-07-21 ENCOUNTER — Other Ambulatory Visit: Payer: Self-pay | Admitting: Family

## 2020-07-21 DIAGNOSIS — B370 Candidal stomatitis: Secondary | ICD-10-CM

## 2020-07-23 ENCOUNTER — Emergency Department (HOSPITAL_COMMUNITY): Payer: Medicare Other

## 2020-07-23 ENCOUNTER — Inpatient Hospital Stay (HOSPITAL_COMMUNITY)
Admission: EM | Admit: 2020-07-23 | Discharge: 2020-08-02 | DRG: 026 | Disposition: A | Discharge status: Home Health | Payer: Medicare Other | Attending: Neurosurgery | Admitting: Neurosurgery

## 2020-07-23 ENCOUNTER — Encounter (HOSPITAL_COMMUNITY): Payer: Self-pay | Admitting: *Deleted

## 2020-07-23 ENCOUNTER — Other Ambulatory Visit: Payer: Self-pay

## 2020-07-23 DIAGNOSIS — R296 Repeated falls: Secondary | ICD-10-CM | POA: Diagnosis present

## 2020-07-23 DIAGNOSIS — Z79899 Other long term (current) drug therapy: Secondary | ICD-10-CM

## 2020-07-23 DIAGNOSIS — I10 Essential (primary) hypertension: Secondary | ICD-10-CM | POA: Diagnosis present

## 2020-07-23 DIAGNOSIS — S065XAA Traumatic subdural hemorrhage with loss of consciousness status unknown, initial encounter: Secondary | ICD-10-CM | POA: Diagnosis present

## 2020-07-23 DIAGNOSIS — E785 Hyperlipidemia, unspecified: Secondary | ICD-10-CM | POA: Diagnosis present

## 2020-07-23 DIAGNOSIS — Z841 Family history of disorders of kidney and ureter: Secondary | ICD-10-CM

## 2020-07-23 DIAGNOSIS — Z9884 Bariatric surgery status: Secondary | ICD-10-CM | POA: Diagnosis not present

## 2020-07-23 DIAGNOSIS — Z882 Allergy status to sulfonamides status: Secondary | ICD-10-CM

## 2020-07-23 DIAGNOSIS — Z8249 Family history of ischemic heart disease and other diseases of the circulatory system: Secondary | ICD-10-CM

## 2020-07-23 DIAGNOSIS — Z888 Allergy status to other drugs, medicaments and biological substances status: Secondary | ICD-10-CM | POA: Diagnosis not present

## 2020-07-23 DIAGNOSIS — E876 Hypokalemia: Secondary | ICD-10-CM | POA: Diagnosis present

## 2020-07-23 DIAGNOSIS — S020XXD Fracture of vault of skull, subsequent encounter for fracture with routine healing: Secondary | ICD-10-CM | POA: Diagnosis not present

## 2020-07-23 DIAGNOSIS — K219 Gastro-esophageal reflux disease without esophagitis: Secondary | ICD-10-CM | POA: Diagnosis present

## 2020-07-23 DIAGNOSIS — S02119A Unspecified fracture of occiput, initial encounter for closed fracture: Secondary | ICD-10-CM | POA: Diagnosis present

## 2020-07-23 DIAGNOSIS — S065X9A Traumatic subdural hemorrhage with loss of consciousness of unspecified duration, initial encounter: Principal | ICD-10-CM | POA: Diagnosis present

## 2020-07-23 DIAGNOSIS — I6203 Nontraumatic chronic subdural hemorrhage: Secondary | ICD-10-CM | POA: Diagnosis present

## 2020-07-23 DIAGNOSIS — Z823 Family history of stroke: Secondary | ICD-10-CM | POA: Diagnosis not present

## 2020-07-23 DIAGNOSIS — Z1624 Resistance to multiple antibiotics: Secondary | ICD-10-CM | POA: Diagnosis present

## 2020-07-23 DIAGNOSIS — Z818 Family history of other mental and behavioral disorders: Secondary | ICD-10-CM

## 2020-07-23 DIAGNOSIS — W19XXXA Unspecified fall, initial encounter: Secondary | ICD-10-CM | POA: Diagnosis present

## 2020-07-23 DIAGNOSIS — Z803 Family history of malignant neoplasm of breast: Secondary | ICD-10-CM | POA: Diagnosis not present

## 2020-07-23 DIAGNOSIS — S02119D Unspecified fracture of occiput, subsequent encounter for fracture with routine healing: Secondary | ICD-10-CM | POA: Diagnosis not present

## 2020-07-23 DIAGNOSIS — F3170 Bipolar disorder, currently in remission, most recent episode unspecified: Secondary | ICD-10-CM | POA: Diagnosis present

## 2020-07-23 DIAGNOSIS — Z811 Family history of alcohol abuse and dependence: Secondary | ICD-10-CM | POA: Diagnosis not present

## 2020-07-23 DIAGNOSIS — B9620 Unspecified Escherichia coli [E. coli] as the cause of diseases classified elsewhere: Secondary | ICD-10-CM | POA: Diagnosis present

## 2020-07-23 DIAGNOSIS — E1169 Type 2 diabetes mellitus with other specified complication: Secondary | ICD-10-CM | POA: Diagnosis present

## 2020-07-23 DIAGNOSIS — Z20822 Contact with and (suspected) exposure to covid-19: Secondary | ICD-10-CM | POA: Diagnosis present

## 2020-07-23 DIAGNOSIS — N390 Urinary tract infection, site not specified: Secondary | ICD-10-CM | POA: Diagnosis present

## 2020-07-23 DIAGNOSIS — Z9049 Acquired absence of other specified parts of digestive tract: Secondary | ICD-10-CM

## 2020-07-23 DIAGNOSIS — Z833 Family history of diabetes mellitus: Secondary | ICD-10-CM | POA: Diagnosis not present

## 2020-07-23 DIAGNOSIS — Z813 Family history of other psychoactive substance abuse and dependence: Secondary | ICD-10-CM

## 2020-07-23 DIAGNOSIS — Z885 Allergy status to narcotic agent status: Secondary | ICD-10-CM | POA: Diagnosis not present

## 2020-07-23 DIAGNOSIS — Z881 Allergy status to other antibiotic agents status: Secondary | ICD-10-CM

## 2020-07-23 DIAGNOSIS — S065X0D Traumatic subdural hemorrhage without loss of consciousness, subsequent encounter: Secondary | ICD-10-CM | POA: Diagnosis not present

## 2020-07-23 DIAGNOSIS — F0280 Dementia in other diseases classified elsewhere without behavioral disturbance: Secondary | ICD-10-CM | POA: Diagnosis not present

## 2020-07-23 DIAGNOSIS — S0101XA Laceration without foreign body of scalp, initial encounter: Secondary | ICD-10-CM | POA: Diagnosis present

## 2020-07-23 DIAGNOSIS — Z9181 History of falling: Secondary | ICD-10-CM

## 2020-07-23 LAB — URINALYSIS, ROUTINE W REFLEX MICROSCOPIC
Bilirubin Urine: NEGATIVE
Glucose, UA: NEGATIVE mg/dL
Hgb urine dipstick: NEGATIVE
Ketones, ur: NEGATIVE mg/dL
Nitrite: NEGATIVE
Protein, ur: NEGATIVE mg/dL
Specific Gravity, Urine: 1.013 (ref 1.005–1.030)
WBC, UA: >50 WBC/hpf — ABNORMAL HIGH (ref 0–5)
pH: 5 (ref 5.0–8.0)

## 2020-07-23 LAB — CBC
HCT: 36.9 % (ref 36.0–46.0)
Hemoglobin: 11.4 g/dL — ABNORMAL LOW (ref 12.0–15.0)
MCH: 28.4 pg (ref 26.0–34.0)
MCHC: 30.9 g/dL (ref 30.0–36.0)
MCV: 92 fL (ref 80.0–100.0)
Platelets: 530 10*3/uL — ABNORMAL HIGH (ref 150–400)
RBC: 4.01 MIL/uL (ref 3.87–5.11)
RDW: 14.5 % (ref 11.5–15.5)
WBC: 15.6 10*3/uL — ABNORMAL HIGH (ref 4.0–10.5)
nRBC: 0 % (ref 0.0–0.2)

## 2020-07-23 LAB — PROTIME-INR
INR: 1 (ref 0.8–1.2)
Prothrombin Time: 12.4 seconds (ref 11.4–15.2)

## 2020-07-23 LAB — BASIC METABOLIC PANEL
Anion gap: 11 (ref 5–15)
BUN: 13 mg/dL (ref 8–23)
CO2: 24 mmol/L (ref 22–32)
Calcium: 8.9 mg/dL (ref 8.9–10.3)
Chloride: 104 mmol/L (ref 98–111)
Creatinine, Ser: 0.89 mg/dL (ref 0.44–1.00)
GFR calc Af Amer: >60 mL/min (ref >60)
GFR calc non Af Amer: >60 mL/min (ref >60)
Glucose, Bld: 130 mg/dL — ABNORMAL HIGH (ref 70–99)
Potassium: 3.1 mmol/L — ABNORMAL LOW (ref 3.5–5.1)
Sodium: 139 mmol/L (ref 135–145)

## 2020-07-23 LAB — CBG MONITORING, ED: Glucose-Capillary: 82 mg/dL (ref 70–99)

## 2020-07-23 LAB — APTT: aPTT: 38 seconds — ABNORMAL HIGH (ref 24–36)

## 2020-07-23 LAB — SARS CORONAVIRUS 2 BY RT PCR (HOSPITAL ORDER, PERFORMED IN ~~LOC~~ HOSPITAL LAB): SARS Coronavirus 2: NEGATIVE

## 2020-07-23 MED ORDER — DONEPEZIL HCL 10 MG PO TABS
10.0000 mg | ORAL_TABLET | Freq: Every day | ORAL | Status: DC
  Administered 2020-07-25 – 2020-08-02 (×9): 10 mg via ORAL
  Filled 2020-07-23 (×10): qty 1

## 2020-07-23 MED ORDER — PROMETHAZINE HCL 25 MG PO TABS: 12.5000 mg | ORAL_TABLET | ORAL | Status: DC | PRN

## 2020-07-23 MED ORDER — QUETIAPINE FUMARATE 400 MG PO TABS
800.0000 mg | ORAL_TABLET | Freq: Every day | ORAL | Status: DC
  Administered 2020-07-24 – 2020-08-01 (×6): 800 mg via ORAL
  Filled 2020-07-23: qty 8
  Filled 2020-07-23 (×2): qty 2
  Filled 2020-07-23: qty 8
  Filled 2020-07-23: qty 4
  Filled 2020-07-23 (×2): qty 2
  Filled 2020-07-23: qty 4
  Filled 2020-07-23 (×3): qty 2
  Filled 2020-07-23 (×3): qty 8
  Filled 2020-07-23: qty 2

## 2020-07-23 MED ORDER — PANTOPRAZOLE SODIUM 40 MG PO TBEC: 40.0000 mg | DELAYED_RELEASE_TABLET | Freq: Every day | ORAL | Status: DC

## 2020-07-23 MED ORDER — GABAPENTIN 300 MG PO CAPS
300.0000 mg | ORAL_CAPSULE | Freq: Three times a day (TID) | ORAL | Status: DC
  Administered 2020-07-23 – 2020-08-02 (×21): 300 mg via ORAL
  Filled 2020-07-23 (×23): qty 1

## 2020-07-23 MED ORDER — BISACODYL 5 MG PO TBEC: 5.0000 mg | DELAYED_RELEASE_TABLET | Freq: Every day | ORAL | Status: DC | PRN

## 2020-07-23 MED ORDER — DULOXETINE HCL 60 MG PO CPEP
60.0000 mg | ORAL_CAPSULE | Freq: Every day | ORAL | Status: DC
  Administered 2020-07-25 – 2020-08-02 (×8): 60 mg via ORAL
  Filled 2020-07-23 (×10): qty 1

## 2020-07-23 MED ORDER — SODIUM CHLORIDE 0.9 % IV SOLN: INTRAVENOUS | Status: DC

## 2020-07-23 MED ORDER — FLEET ENEMA 7-19 GM/118ML RE ENEM: 1.0000 | ENEMA | Freq: Once | RECTAL | Status: DC | PRN

## 2020-07-23 MED ORDER — NITROFURANTOIN MONOHYD MACRO 100 MG PO CAPS: 100.0000 mg | ORAL_CAPSULE | Freq: Once | ORAL | Status: DC

## 2020-07-23 MED ORDER — ACETAMINOPHEN 650 MG RE SUPP: 650.0000 mg | RECTAL | Status: DC | PRN

## 2020-07-23 MED ORDER — POLYETHYLENE GLYCOL 3350 17 G PO PACK: 17.0000 g | PACK | Freq: Every day | ORAL | Status: DC | PRN

## 2020-07-23 MED ORDER — SODIUM CHLORIDE 0.9 % IV BOLUS
1000.0000 mL | Freq: Once | INTRAVENOUS | Status: AC
  Administered 2020-07-23: 1000 mL via INTRAVENOUS

## 2020-07-23 MED ORDER — LABETALOL HCL 5 MG/ML IV SOLN: 10.0000 mg | INTRAVENOUS | Status: DC | PRN

## 2020-07-23 MED ORDER — INSULIN ASPART 100 UNIT/ML ~~LOC~~ SOLN: 0.0000 [IU] | Freq: Every day | SUBCUTANEOUS | Status: DC

## 2020-07-23 MED ORDER — ALBUTEROL SULFATE (2.5 MG/3ML) 0.083% IN NEBU: 2.5000 mg | INHALATION_SOLUTION | Freq: Four times a day (QID) | RESPIRATORY_TRACT | Status: DC | PRN

## 2020-07-23 MED ORDER — HYDROCODONE-ACETAMINOPHEN 5-325 MG PO TABS
1.0000 | ORAL_TABLET | ORAL | Status: DC | PRN
  Administered 2020-07-25 – 2020-08-02 (×12): 1 via ORAL
  Filled 2020-07-23 (×12): qty 1

## 2020-07-23 MED ORDER — LEVETIRACETAM IN NACL 500 MG/100ML IV SOLN
500.0000 mg | Freq: Two times a day (BID) | INTRAVENOUS | Status: DC
  Administered 2020-07-24 – 2020-07-30 (×14): 500 mg via INTRAVENOUS
  Filled 2020-07-23 (×16): qty 100

## 2020-07-23 MED ORDER — ONDANSETRON HCL 4 MG/2ML IJ SOLN
4.0000 mg | INTRAMUSCULAR | Status: DC | PRN
  Administered 2020-07-24: 4 mg via INTRAVENOUS
  Filled 2020-07-23: qty 2

## 2020-07-23 MED ORDER — POTASSIUM CHLORIDE CRYS ER 20 MEQ PO TBCR
40.0000 meq | EXTENDED_RELEASE_TABLET | Freq: Two times a day (BID) | ORAL | Status: AC
  Administered 2020-07-23 – 2020-07-24 (×2): 40 meq via ORAL
  Filled 2020-07-23 (×2): qty 2

## 2020-07-23 MED ORDER — ONDANSETRON HCL 4 MG PO TABS: 4.0000 mg | ORAL_TABLET | ORAL | Status: DC | PRN

## 2020-07-23 MED ORDER — ACETAMINOPHEN 325 MG PO TABS
650.0000 mg | ORAL_TABLET | ORAL | Status: DC | PRN
  Administered 2020-07-27: 650 mg via ORAL
  Filled 2020-07-23: qty 2

## 2020-07-23 MED ORDER — FUROSEMIDE 20 MG PO TABS
10.0000 mg | ORAL_TABLET | ORAL | Status: DC
  Administered 2020-07-26 – 2020-08-01 (×4): 10 mg via ORAL
  Filled 2020-07-23 (×5): qty 1

## 2020-07-23 MED ORDER — ASCORBIC ACID 500 MG PO TABS
500.0000 mg | ORAL_TABLET | Freq: Every day | ORAL | Status: DC
  Administered 2020-07-25 – 2020-08-02 (×7): 500 mg via ORAL
  Filled 2020-07-23 (×8): qty 1

## 2020-07-23 MED ORDER — SENNA 8.6 MG PO TABS
1.0000 | ORAL_TABLET | Freq: Two times a day (BID) | ORAL | Status: DC
  Administered 2020-07-23 – 2020-08-02 (×13): 8.6 mg via ORAL
  Filled 2020-07-23 (×14): qty 1

## 2020-07-23 MED ORDER — HYDROMORPHONE HCL 1 MG/ML IJ SOLN
0.5000 mg | INTRAMUSCULAR | Status: DC | PRN
  Administered 2020-07-24: 1 mg via INTRAVENOUS
  Administered 2020-07-24: 0.5 mg via INTRAVENOUS
  Filled 2020-07-23 (×2): qty 1

## 2020-07-23 MED ORDER — ADULT MULTIVITAMIN W/MINERALS CH
1.0000 | ORAL_TABLET | Freq: Every day | ORAL | Status: DC
  Administered 2020-07-25 – 2020-08-02 (×7): 1 via ORAL
  Filled 2020-07-23 (×8): qty 1

## 2020-07-23 MED ORDER — QUETIAPINE FUMARATE 200 MG PO TABS: 200.0000 mg | ORAL_TABLET | Freq: Every day | ORAL | Status: DC

## 2020-07-23 MED ORDER — AMOXICILLIN-POT CLAVULANATE 875-125 MG PO TABS
1.0000 | ORAL_TABLET | Freq: Two times a day (BID) | ORAL | Status: DC
  Administered 2020-07-23 – 2020-07-26 (×4): 1 via ORAL
  Filled 2020-07-23 (×9): qty 1

## 2020-07-23 MED ORDER — NALOXONE HCL 0.4 MG/ML IJ SOLN: 0.0800 mg | INTRAMUSCULAR | Status: DC | PRN

## 2020-07-23 MED ORDER — PANTOPRAZOLE SODIUM 40 MG IV SOLR
40.0000 mg | Freq: Every day | INTRAVENOUS | Status: DC
  Administered 2020-07-23 – 2020-07-29 (×7): 40 mg via INTRAVENOUS
  Filled 2020-07-23 (×7): qty 40

## 2020-07-23 MED ORDER — CIPROFLOXACIN HCL 500 MG PO TABS: 500.0000 mg | ORAL_TABLET | Freq: Two times a day (BID) | ORAL | Status: DC

## 2020-07-23 MED ORDER — INSULIN ASPART 100 UNIT/ML ~~LOC~~ SOLN: 0.0000 [IU] | Freq: Three times a day (TID) | SUBCUTANEOUS | Status: DC

## 2020-07-23 MED ORDER — LAMOTRIGINE 100 MG PO TABS
200.0000 mg | ORAL_TABLET | Freq: Every day | ORAL | Status: DC
  Administered 2020-07-25 – 2020-08-02 (×9): 200 mg via ORAL
  Filled 2020-07-23 (×10): qty 2

## 2020-07-23 MED ORDER — HYDRALAZINE HCL 20 MG/ML IJ SOLN: 5.0000 mg | INTRAMUSCULAR | Status: DC | PRN

## 2020-07-23 NOTE — ED Notes (Signed)
Report given to [facility] and charge nurse [facility] at [facility].

## 2020-07-23 NOTE — ED Provider Notes (Signed)
[facility] EMERGENCY DEPARTMENT Provider Note   CSN: [id number] Arrival date & time: 07/23/20  1308     History Chief Complaint  Patient presents with  . Fatigue    Erin Donaldson is a 69 y.o. female with PMHx HTN, HLD, GERD, Diabetes, Bipolar disorder, and recent admission for acute subdural hematoma who presents to the ED today with complaint of "fatigue." Pt reports she woke up this morning with weakness in her BLEs. She states she felt like she could not walk on them. She proceeded to check her blood pressure and noted it was low prompting her to come to the ED. Pt states that she has also had frequent falls since her hospital discharge on 08/25. She states she fell 5 days ago due to her feeling off balanced. She then fell again 2 days ago for similar reasons - she believes she struck her head in the same spot that she struck it last month when she developed a SDH. Pt denies LOC. She did not noticed any bleeding to her head at the time however states she noticed bleeding today. She is not anticoagulated. Pt states she does not have a worsening headache. She does not feel like her arms are weak. No other complaints.   Per chart review: Pt admitted to the hospital from 08/22 - 08/29 for acute bilateral subdural hematoma. She initially presented after falling; brought in as a level 2 trauma s/2 confusion from the fall. CT scan showed bilateral SDH with small amount of midline shift and pt was admitted by neurosurgeon Dr. Franky Macho. Repeat CT scan did not show any changes and pt was discharged home with home PT and OT.   The history is provided by the patient and medical records.       Past Medical History:  Diagnosis Date  . Anemia   . Anxiety   . Bipolar 1 disorder (HCC)   . Bipolar affective (HCC)   . Depression   . Diabetes in pregnancy   . Diabetes mellitus   . Diabetes mellitus without complication (HCC)   . Elevated LFTs   . GERD (gastroesophageal reflux disease)   .  Hyperlipidemia   . Hypertension   . IBS (irritable bowel syndrome)   . Migraine     Patient Active Problem List   Diagnosis Date Noted  . Generalized edema 07/16/2020  . Hospital discharge follow-up 07/16/2020  . Acute subdural hematoma (HCC) 07/07/2020  . Acute pain of right shoulder 07/02/2020  . Rash due to allergy 07/02/2020  . AKI (acute kidney injury) (HCC) 06/20/2020  . Hypokalemia 06/20/2020  . Acute lower UTI 06/20/2020  . Vitamin D deficiency 10/03/2018  . Overweight (BMI 25.0-29.9) 04/26/2018  . Senile purpura (HCC) 04/04/2018  . Abnormal EKG 12/29/2017  . Iron deficiency anemia 01/24/2016  . Swelling of right lower extremity 07/15/2015  . Right hip pain 07/15/2015  . Microcytic anemia 07/15/2015  . Elevated LFTs   . History of gastric bypass 01/15/2014  . Amnestic disorder due to medical condition (HCC) 08/23/2013  . Diarrhea 07/19/2013  . Bipolar affective disorder in remission (HCC) 08/26/2009  . ANXIETY 08/26/2009  . Migraine headache 08/26/2009  . Hyperlipidemia associated with type 2 diabetes mellitus (HCC) 08/15/2009  . Hyperlipidemia 08/15/2009  . Essential hypertension 08/15/2009  . GERD 08/15/2009  . CONSTIPATION 08/15/2009  . IRRITABLE BOWEL SYNDROME 08/15/2009    Past Surgical History:  Procedure Laterality Date  . CHOLECYSTECTOMY    . GASTRIC BYPASS  2005  OB History   No obstetric history on file.     Family History  Problem Relation Age of Onset  . Kidney disease Father   . Heart disease Father 80       CABG  . Diabetes Father   . CAD Father   . Heart disease Mother 30       CAD  . Breast cancer Mother   . Cancer Mother        bladder  . Heart disease Maternal Grandfather   . Heart attack Maternal Grandfather   . Stroke Maternal Grandfather   . Heart disease Brother   . Dementia Brother   . Heart disease Maternal Aunt   . Heart disease Maternal Uncle   . Heart disease Paternal Aunt   . Heart disease Paternal Uncle   .  Diabetes Maternal Aunt   . Diabetes Maternal Uncle   . Diabetes Paternal Aunt   . Diabetes Paternal Uncle   . Stroke Maternal Grandmother   . Heart disease Paternal Grandfather   . Diabetes Daughter   . Drug abuse Daughter   . Depression Sister   . Hypertension Sister   . Alcohol abuse Brother   . Drug abuse Brother   . Other Brother        accident caused brain swelling   . Colon cancer Neg Hx     Social History   Tobacco Use  . Smoking status: Never Smoker  . Smokeless tobacco: Never Used  Vaping Use  . Vaping Use: Never used  Substance Use Topics  . Alcohol use: No    Alcohol/week: 0.0 standard drinks    Comment: rare  . Drug use: Yes    Types: Mescaline    Home Medications Prior to Admission medications   Medication Sig Start Date End Date Taking? Authorizing Provider  albuterol (VENTOLIN HFA) 108 (90 Base) MCG/ACT inhaler Inhale 2 puffs into the lungs every 6 (six) hours as needed for wheezing or shortness of breath. 07/16/20  Yes Daryll Drown, NP  ascorbic acid (VITAMIN C) 500 MG tablet Take 500-1,000 mg by mouth daily.   Yes [provider]  cyclobenzaprine (FLEXERIL) 10 MG tablet Take 0.5-1 tablets (5-10 mg total) by mouth 2 (two) times daily as needed for muscle spasms. 06/14/20  Yes Gottschalk, Ashly M, DO  donepezil (ARICEPT) 10 MG tablet TAKE 1 TABLET BY MOUTH EVERY DAY 05/21/20  Yes Gottschalk, Ashly M, DO  DULoxetine (CYMBALTA) 60 MG capsule TAKE 1 CAPSULE BY MOUTH EVERY DAY 02/13/20  Yes Cottle, Steva Ready., MD  fluticasone [facility]) 50 MCG/ACT nasal spray SPRAY 2 SPRAYS INTO EACH NOSTRIL EVERY DAY 02/12/20  Yes Rakes, Doralee Albino, FNP  furosemide (LASIX) 20 MG tablet Take 10 mg by mouth every other day.    Yes [provider]  gabapentin (NEURONTIN) 300 MG capsule TAKE 3 CAPSULES BY MOUTH THREE TIMES A DAY Patient taking differently: Take 900 mg by mouth 3 (three) times daily. Pt states she take 3 capsules at bedtime and one in the morning.  02/02/20  Yes Gottschalk, Kathie Rhodes M, DO  ibuprofen (ADVIL) 200 MG tablet Take 800 mg by mouth every 6 (six) hours as needed for headache or mild pain.   Yes [provider]  lamoTRIgine (LAMICTAL) 200 MG tablet TAKE 1 TABLET BY MOUTH EVERY DAY 06/21/20  Yes Cottle, Steva Ready., MD  meclizine (ANTIVERT) 25 MG tablet Take 1 tablet (25 mg total) by mouth 3 (three) times daily as needed  for dizziness. 07/16/20  Yes Daryll Drown, NP  Multiple Vitamin (MULTIVITAMIN) LIQD Take 15 mLs by mouth daily. 06/22/20  Yes Johnson, Clanford L, MD  nystatin cream (MYCOSTATIN) Apply topically 2 (two) times daily. 04/16/20  Yes Hawks, Christy A, FNP  pantoprazole (PROTONIX) 40 MG tablet TAKE 2 TABLETS BY MOUTH EVERY DAY Patient taking differently: Take 40 mg by mouth daily.  07/03/20  Yes Gottschalk, Ashly M, DO  QUEtiapine (SEROQUEL) 200 MG tablet TAKE 1 TABLET BY MOUTH EVERY DAY Patient taking differently: 200 mg. In the middle of the night 02/13/20  Yes Cottle, Steva Ready., MD  QUEtiapine (SEROQUEL) 400 MG tablet TAKE 2 TABLETS (800 MG TOTAL) BY MOUTH AT BEDTIME. 04/08/20  Yes Cottle, Steva Ready., MD  rizatriptan (MAXALT) 10 MG tablet Take 1 tablet (10 mg total) by mouth as needed. May repeat in 2 hours if needed 11/03/18  Yes Rakes, Doralee Albino, FNP  acetaminophen-codeine (TYLENOL #3) 300-30 MG tablet Take 1 tablet by mouth every 6 (six) hours as needed for moderate pain. Patient not taking: Reported on 07/23/2020 07/08/20   Coletta Memos, MD  cetirizine (ZYRTEC) 10 MG tablet Take 1 tablet (10 mg total) by mouth daily. Patient not taking: Reported on 07/23/2020 02/12/20   Sonny Masters, FNP  Vitamin D, Ergocalciferol, (DRISDOL) 1.25 MG (50000 UT) CAPS capsule Take 50,000 Units by mouth 2 (two) times a week.  Patient not taking: Reported on 07/23/2020 08/27/18   [provider]    Allergies    Bactrim [sulfamethoxazole-trimethoprim], Darvocet [propoxyphene n-acetaminophen], Decongestant [oxymetazoline], Elavil  [amitriptyline], Lithium, Risperidone and related, Ultram [tramadol], Cephalosporins, Keflex [cephalexin], and Omnicef [cefdinir]  Review of Systems   Review of Systems  Constitutional: Negative for chills and fever.  Eyes: Negative for visual disturbance.  Musculoskeletal: Positive for gait problem.  Skin: Positive for wound.  Neurological: Positive for headaches (unchanged from baseline). Negative for syncope.  All other systems reviewed and are negative.   Physical Exam Updated Vital Signs BP 117/75   Pulse (!) 102   Temp 97.8 F (36.6 C) (Oral)   Resp 17   Ht  (1.626 m)   Wt 47.2 kg   SpO2 98%   BMI 17.85 kg/m   Physical Exam Vitals and nursing note reviewed.  Constitutional:      Appearance: She is not ill-appearing or diaphoretic.  HENT:     Head: Normocephalic.     Comments: Large area of matted hair to the right occiput s/2 blood. Unable to visualize wound underneath at this time    Right Ear: Tympanic membrane normal.     Left Ear: Tympanic membrane normal.  Eyes:     Conjunctiva/sclera: Conjunctivae normal.  Cardiovascular:     Rate and Rhythm: Normal rate and regular rhythm.     Pulses: Normal pulses.  Pulmonary:     Effort: Pulmonary effort is normal.     Breath sounds: Normal breath sounds. No wheezing, rhonchi or rales.  Abdominal:     Palpations: Abdomen is soft.     Tenderness: There is no abdominal tenderness. There is no guarding or rebound.  Musculoskeletal:     Cervical back: Normal range of motion and neck supple.  Skin:    General: Skin is warm and dry.  Neurological:     Mental Status: She is alert.     Comments: CN 3-12 grossly intact A&O x4 GCS 15 Strength 4/5 to RUE/RLE; sensation intact. Strength 5/5 to LUE/LLE.  Coordination with finger-to-nose  WNL Neg pronator drift     ED Results / Procedures / Treatments   Labs (all labs ordered are listed, but only abnormal results are displayed) Labs Reviewed  BASIC METABOLIC PANEL  - Abnormal; Notable for the following components:      Result Value   Potassium 3.1 (*)    Glucose, Bld 130 (*)    All other components within normal limits  CBC - Abnormal; Notable for the following components:   WBC 15.6 (*)    Hemoglobin 11.4 (*)    Platelets 530 (*)    All other components within normal limits  APTT - Abnormal; Notable for the following components:   aPTT 38 (*)    All other components within normal limits  SARS CORONAVIRUS 2 BY RT PCR (HOSPITAL ORDER, PERFORMED IN [place] [facility])  PROTIME-INR  URINALYSIS, ROUTINE W REFLEX MICROSCOPIC    EKG None  Radiology CT HEAD WO CONTRAST  Result Date: 07/23/2020 CLINICAL DATA:  Fall 2 days ago recent hospitalization for subdural EXAM: CT HEAD WITHOUT CONTRAST TECHNIQUE: Contiguous axial images were obtained from the base of the skull through the vertex without intravenous contrast. COMPARISON:  CT brain 06/27/2020, 07/08/2020, 07/07/2020, 03/31/2019 FINDINGS: Brain: No acute territorial infarction or intracranial mass is visualized. Mixed density left convexity subdural hematoma with acute blood products laterally and at the vertex. This measures 13 mm maximum thickness anteriorly. Increased midline shift to the right now measuring 6 mm. Stable ventricle size. Previously noted right subdural hematoma is largely resolved. There is a small amount of subdural blood along the right tentorium. Generalized sulcal effacement on the left consistent with edema. Vascular: No hyperdense vessels. Scattered carotid vascular calcification Skull: Nondisplaced right occipital skull fracture possibly extending to the foramen magnum, known finding. No new fracture is seen. Sinuses/Orbits: Fluid in the right sphenoid sinus without definitive skull base lucency. Other: Moderate to large right posterior parietal hematoma with laceration. IMPRESSION: 1. Mixed density left convexity subdural hematoma with acute blood products present. This  measures up to 13 mm maximum thickness anteriorly. There is generalized edema within the left hemisphere with increased midline shift to the right now measuring 6 mm. 2. Previously noted right subdural hematoma is largely resolved. Small amount of subdural blood along the right 3. Nondisplaced right occipital skull fracture possibly extending to the foramen magnum, without change. No new fracture is seen 4. Moderate to large right posterior parietal scalp laceration and hematoma Critical Value/emergent results were called by telephone at the time of interpretation on 07/23/2020 at 3:50 pm to [facility] , who verbally acknowledged these results. Electronically Signed   By: Jasmine PangKim  Fujinaga M.D.   On: 07/23/2020 15:50    Procedures Procedures (including critical care time)  Medications Ordered in ED Medications  hydrALAZINE (APRESOLINE) injection 5 mg (has no administration in time range)  sodium chloride 0.9 % bolus 1,000 mL (0 mLs Intravenous Stopped 07/23/20 1643)    ED Course  I have reviewed the triage vital signs and the nursing notes.  Pertinent labs & imaging results that were available during my care of the patient were reviewed by me and considered in my medical decision making (see chart for details).  Clinical Course as of Jul 23 1806  Tue Jul 23, 2020  1544 Resolved right side sdh. Left bigger, acute on subacute. 13 mm. More midline shift. 6-7 on left.    [MV]    Clinical Course User Index [MV] Tanda RockersVenter, Itzayana Pardy, [place]PA-C  MDM Rules/Calculators/A&P                          69 year old female presents to the ED today complaining of fatigue/generalized weakness to her bilateral lower extremities that she noticed this morning.  Of note patient was recently discharged from the hospital on 8/29 after she had a fall several days earlier resulting in bilateral subdural hematomas.  It appears she is supposed to have home PT and OT at the house.  She states that however she  has had frequent falls since being discharged.  She had one 5 days ago as well as 1 2 days ago.  She states this is related to feeling off balance with ambulation.  She thinks she struck her head on the same side that she struck her head during her last hospitalization.  She states she noticed some bleeding to the area today.  On exam patient has a large area of matted hair on the right occiput, I am unable to visualize wound at this time however will plan for nursing staff to clean area for further visualization.  On exam patient is alert and oriented x3.  She has no obvious cranial nerve deficits.  She is noted to have weakness in the right upper and right lower extremities 4 out of 5 compared to 5 out of 5 on the left.  Patient states that this is unchanged since previous hospitalization.  She states she has a headache but she always has headaches since no worse than normal.  On arrival to the ED patient's blood pressure noted to be decreased at 86/38.  She is also mildly tachycardic in the very low 100s.  Repeat blood pressure 117/76.  We will plan for small amount of fluids.  We will plan for CT head given recent fall and recent subdural hematomas.  We will plan for lab work.  Received call from radiologist regarding concern for bigger more acute subdural hematoma on the left side with worsening midline shift approximately 6 mm.  We will plan to discuss with neurosurgery.  Covid test has been ordered.   Wound has been cleaned; small 0.5 cm laceration evaluated by attending physician Dr. Estell Harpin. Does not appear to require closure at this time. I have not personally evaluated the wound myself as pt had bandage covering it when I went to reassess.   Discussed case with Dr. Franky Macho neurosurgery; will need surgery in the near future to drain blood. No beds available in the neuro ICU at this time however will plan to ED to ED transfer. Have spoken with [facility] Rush [facility] in [facility] who is accepting at this time.  Plan for neurosurgery to admit.   This note was prepared using Dragon voice recognition software and may include unintentional dictation errors due to the inherent limitations of voice recognition software.  Final Clinical Impression(s) / ED Diagnoses Final diagnoses:  Subdural hematoma [facility])    Rx / DC Orders ED Discharge Orders    None       Tanda Rockers, PA-C 07/23/20 1807    Bethann Berkshire, MD 07/26/20 [phone]

## 2020-07-23 NOTE — ED Notes (Signed)
[place], [phone], daughter

## 2020-07-23 NOTE — ED Notes (Signed)
Patient states that she fell and hit her head 2 days ago. Pt states she woke up dizzy today. Pt is a two assist from wheelchair to the bed and appears weak and unsteady. VVS

## 2020-07-23 NOTE — ED Provider Notes (Signed)
Patient is a 69 year old woman who presents today in transfer from [facility] for neurosurgical admission.  At [facility] she had a CT scan to evaluate her known subdurals, and they were found that she had worsening subdural hematoma on the left side with worsening midline shift, Dr. Franky Macho with neurosurgery to admit. Physical Exam  BP 138/72   Pulse 98   Temp 97.8 F (36.6 C) (Oral)   Resp 18   Ht 5\' 4"  (1.626 m)   Wt 47.2 kg   SpO2 94%   BMI 17.85 kg/m   Physical Exam   Patient oriented to person, place and time.  Rests with eyes closed but does open them to look at me.  Speech is slightly slurred.  She is not in acute distress, respirations even, unlabored.    ED Course/Procedures   Clinical Course as of Jul 23 2117  Tue Jul 23, 2020  1544 Resolved right side sdh. Left bigger, acute on subacute. 13 mm. More midline shift. 6-7 on left.    [MV]    Clinical Course User Index [MV] Jul 25, 2020, PA-C    Procedures   CT HEAD WO CONTRAST  Result Date: 07/23/2020 CLINICAL DATA:  Fall 2 days ago recent hospitalization for subdural EXAM: CT HEAD WITHOUT CONTRAST TECHNIQUE: Contiguous axial images were obtained from the base of the skull through the vertex without intravenous contrast. COMPARISON:  CT brain 06/27/2020, 07/08/2020, 07/07/2020, 03/31/2019 FINDINGS: Brain: No acute territorial infarction or intracranial mass is visualized. Mixed density left convexity subdural hematoma with acute blood products laterally and at the vertex. This measures 13 mm maximum thickness anteriorly. Increased midline shift to the right now measuring 6 mm. Stable ventricle size. Previously noted right subdural hematoma is largely resolved. There is a small amount of subdural blood along the right tentorium. Generalized sulcal effacement on the left consistent with edema. Vascular: No hyperdense vessels. Scattered carotid vascular calcification Skull: Nondisplaced right occipital skull fracture possibly  extending to the foramen magnum, known finding. No new fracture is seen. Sinuses/Orbits: Fluid in the right sphenoid sinus without definitive skull base lucency. Other: Moderate to large right posterior parietal hematoma with laceration. IMPRESSION: 1. Mixed density left convexity subdural hematoma with acute blood products present. This measures up to 13 mm maximum thickness anteriorly. There is generalized edema within the left hemisphere with increased midline shift to the right now measuring 6 mm. 2. Previously noted right subdural hematoma is largely resolved. Small amount of subdural blood along the right 3. Nondisplaced right occipital skull fracture possibly extending to the foramen magnum, without change. No new fracture is seen 4. Moderate to large right posterior parietal scalp laceration and hematoma Critical Value/emergent results were called by telephone at the time of interpretation on 07/23/2020 at 3:50 pm to provider Margot for [facility] ZAMMIT , who verbally acknowledged these results. Electronically Signed   By: [facility] M.D.   On: 07/23/2020 15:50     MDM   Patient is a transfer from [facility] for neurosurgical consult and admission for worsening subdural hematoma.  At the time of my evaluation patient has already been seen by neurosurgical PA.  Her head wound was evaluated at out side hospital, I did not personally evaluate the wound as her head is dressed at this time.    Patient will be evaluated and admitted by neurosurgery.  She appears hemodynamically stable at this time.   Note: Portions of this report may have been transcribed using voice recognition software. Every  effort was made to ensure accuracy; however, inadvertent computerized transcription errors may be present         Norman Clay 07/23/20 2126    Tegeler, Canary Brim, MD 07/23/20 [phone]

## 2020-07-23 NOTE — ED Triage Notes (Signed)
Sates she feels weak, blood pressure is low

## 2020-07-23 NOTE — ED Notes (Signed)
Pt arrived from [facility] from [facility]. Neurosurgery at bedside.

## 2020-07-23 NOTE — H&P (Signed)
Chief Complaint   Chief Complaint  Patient presents with  . Fatigue    HPI   Consult requested by: Milinda Antis, EDP [facility] Reason for consult: SDH  HPI: Erin Donaldson is a 69 y.o. female with history of Bipolar, HTN, HDL, DM2, SDH who presented to [facility] ED earlier today for progressive weakness. Patient has had multiple falls over the last several weeks, including more recently 8/22 when she presented to the ED and found to have bilateral SDH as well as a skull fracture. She did not require any Ns intervention & was admitted by Dr Franky Macho for observation. She underwent a repeat head CT the following am which was unchanged and therefore she was discharged home with outpatient follow up. Over the last two days, she has become more "weak" and has fallen multiple times with + head trauma. She underwent a head CT which showed the right SDH has  Essentially resolved with minimal residual, but the left SDH has increased in size, with acute and chronic blood products. Dr Franky Macho was called and rec admission for eventual evacuation of the SDH. Patient complains of chronic headaches, without worsening and generalized weakness. No changes in vision, N/T.  ED labs also notable for WBC count 15.6, possible UTI. Patient was started on Macrobid. Pharmacy rec Augmentin based on age and renal function.   Patient Active Problem List   Diagnosis Date Noted  . Generalized edema 07/16/2020  . Hospital discharge follow-up 07/16/2020  . Acute subdural hematoma (HCC) 07/07/2020  . Acute pain of right shoulder 07/02/2020  . Rash due to allergy 07/02/2020  . AKI (acute kidney injury) (HCC) 06/20/2020  . Hypokalemia 06/20/2020  . Acute lower UTI 06/20/2020  . Vitamin D deficiency 10/03/2018  . Overweight (BMI 25.0-29.9) 04/26/2018  . Senile purpura (HCC) 04/04/2018  . Abnormal EKG 12/29/2017  . Iron deficiency anemia 01/24/2016  . Swelling of right lower extremity 07/15/2015  . Right hip pain 07/15/2015   . Microcytic anemia 07/15/2015  . Elevated LFTs   . History of gastric bypass 01/15/2014  . Amnestic disorder due to medical condition (HCC) 08/23/2013  . Diarrhea 07/19/2013  . Bipolar affective disorder in remission (HCC) 08/26/2009  . ANXIETY 08/26/2009  . Migraine headache 08/26/2009  . Hyperlipidemia associated with type 2 diabetes mellitus (HCC) 08/15/2009  . Hyperlipidemia 08/15/2009  . Essential hypertension 08/15/2009  . GERD 08/15/2009  . CONSTIPATION 08/15/2009  . IRRITABLE BOWEL SYNDROME 08/15/2009    PMH: Past Medical History:  Diagnosis Date  . Anemia   . Anxiety   . Bipolar 1 disorder (HCC)   . Bipolar affective (HCC)   . Depression   . Diabetes in pregnancy   . Diabetes mellitus   . Diabetes mellitus without complication (HCC)   . Elevated LFTs   . GERD (gastroesophageal reflux disease)   . Hyperlipidemia   . Hypertension   . IBS (irritable bowel syndrome)   . Migraine     PSH: Past Surgical History:  Procedure Laterality Date  . CHOLECYSTECTOMY    . GASTRIC BYPASS  2005    (Not in a hospital admission)   SH: Social History   Tobacco Use  . Smoking status: Never Smoker  . Smokeless tobacco: Never Used  Vaping Use  . Vaping Use: Never used  Substance Use Topics  . Alcohol use: No    Alcohol/week: 0.0 standard drinks    Comment: rare  . Drug use: Yes    Types: Mescaline  MEDS: Prior to Admission medications   Medication Sig Start Date End Date Taking? Authorizing Provider  albuterol (VENTOLIN HFA) 108 (90 Base) MCG/ACT inhaler Inhale 2 puffs into the lungs every 6 (six) hours as needed for wheezing or shortness of breath. 07/16/20  Yes Daryll Drown, NP  ascorbic acid (VITAMIN C) 500 MG tablet Take 500-1,000 mg by mouth daily.   Yes [provider]  cyclobenzaprine (FLEXERIL) 10 MG tablet Take 0.5-1 tablets (5-10 mg total) by mouth 2 (two) times daily as needed for muscle spasms. 06/14/20  Yes Gottschalk, Ashly M, DO   donepezil (ARICEPT) 10 MG tablet TAKE 1 TABLET BY MOUTH EVERY DAY 05/21/20  Yes Gottschalk, Ashly M, DO  DULoxetine (CYMBALTA) 60 MG capsule TAKE 1 CAPSULE BY MOUTH EVERY DAY 02/13/20  Yes Cottle, Steva Ready., MD  fluticasone [facility]) 50 MCG/ACT nasal spray SPRAY 2 SPRAYS INTO EACH NOSTRIL EVERY DAY 02/12/20  Yes Rakes, Doralee Albino, FNP  furosemide (LASIX) 20 MG tablet Take 10 mg by mouth every other day.    Yes [provider]  gabapentin (NEURONTIN) 300 MG capsule TAKE 3 CAPSULES BY MOUTH THREE TIMES A DAY Patient taking differently: Take 900 mg by mouth 3 (three) times daily. Pt states she take 3 capsules at bedtime and one in the morning. 02/02/20  Yes Gottschalk, Kathie Rhodes M, DO  ibuprofen (ADVIL) 200 MG tablet Take 800 mg by mouth every 6 (six) hours as needed for headache or mild pain.   Yes [provider]  lamoTRIgine (LAMICTAL) 200 MG tablet TAKE 1 TABLET BY MOUTH EVERY DAY 06/21/20  Yes Cottle, Steva Ready., MD  meclizine (ANTIVERT) 25 MG tablet Take 1 tablet (25 mg total) by mouth 3 (three) times daily as needed for dizziness. 07/16/20  Yes Daryll Drown, NP  Multiple Vitamin (MULTIVITAMIN) LIQD Take 15 mLs by mouth daily. 06/22/20  Yes Johnson, Clanford L, MD  nystatin cream (MYCOSTATIN) Apply topically 2 (two) times daily. 04/16/20  Yes Hawks, Christy A, FNP  pantoprazole (PROTONIX) 40 MG tablet TAKE 2 TABLETS BY MOUTH EVERY DAY Patient taking differently: Take 40 mg by mouth daily.  07/03/20  Yes Gottschalk, Ashly M, DO  QUEtiapine (SEROQUEL) 200 MG tablet TAKE 1 TABLET BY MOUTH EVERY DAY Patient taking differently: 200 mg. In the middle of the night 02/13/20  Yes Cottle, Steva Ready., MD  QUEtiapine (SEROQUEL) 400 MG tablet TAKE 2 TABLETS (800 MG TOTAL) BY MOUTH AT BEDTIME. 04/08/20  Yes Cottle, Steva Ready., MD  rizatriptan (MAXALT) 10 MG tablet Take 1 tablet (10 mg total) by mouth as needed. May repeat in 2 hours if needed 11/03/18  Yes Rakes, Doralee Albino, FNP  acetaminophen-codeine  (TYLENOL #3) 300-30 MG tablet Take 1 tablet by mouth every 6 (six) hours as needed for moderate pain. Patient not taking: Reported on 07/23/2020 07/08/20   Coletta Memos, MD  cetirizine (ZYRTEC) 10 MG tablet Take 1 tablet (10 mg total) by mouth daily. Patient not taking: Reported on 07/23/2020 02/12/20   Sonny Masters, FNP  Vitamin D, Ergocalciferol, (DRISDOL) 1.25 MG (50000 UT) CAPS capsule Take 50,000 Units by mouth 2 (two) times a week.  Patient not taking: Reported on 07/23/2020 08/27/18   [provider]    ALLERGY: Allergies  Allergen Reactions  . Bactrim [Sulfamethoxazole-Trimethoprim]     Raw and irritatated in mouth.  Leodis [place] [Propoxyphene N-Acetaminophen]   . Decongestant [Oxymetazoline]   . Elavil [Amitriptyline]   . Lithium Other (See Comments)  Hx toxicity  . Risperidone And Related   . Ultram [Tramadol]   . Cephalosporins Rash and Other (See Comments)    Keflex- Higher dose cause a rash  . Keflex [Cephalexin] Rash  . Omnicef [Cefdinir] Rash    Social History   Tobacco Use  . Smoking status: Never Smoker  . Smokeless tobacco: Never Used  Substance Use Topics  . Alcohol use: No    Alcohol/week: 0.0 standard drinks    Comment: rare     Family History  Problem Relation Age of Onset  . Kidney disease Father   . Heart disease Father 78       CABG  . Diabetes Father   . CAD Father   . Heart disease Mother 71       CAD  . Breast cancer Mother   . Cancer Mother        bladder  . Heart disease Maternal Grandfather   . Heart attack Maternal Grandfather   . Stroke Maternal Grandfather   . Heart disease Brother   . Dementia Brother   . Heart disease Maternal Aunt   . Heart disease Maternal Uncle   . Heart disease Paternal Aunt   . Heart disease Paternal Uncle   . Diabetes Maternal Aunt   . Diabetes Maternal Uncle   . Diabetes Paternal Aunt   . Diabetes Paternal Uncle   . Stroke Maternal Grandmother   . Heart disease Paternal Grandfather   .  Diabetes Daughter   . Drug abuse Daughter   . Depression Sister   . Hypertension Sister   . Alcohol abuse Brother   . Drug abuse Brother   . Other Brother        accident caused brain swelling   . Colon cancer Neg Hx      ROS   Review of Systems  Constitutional: Positive for malaise/fatigue. Negative for fever.  HENT: Negative.   Eyes: Negative.  Negative for blurred vision, double vision and photophobia.  Respiratory: Negative.   Cardiovascular: Negative.   Gastrointestinal: Negative.  Negative for nausea and vomiting.  Genitourinary: Negative.  Negative for dysuria, frequency and urgency.  Musculoskeletal: Negative.   Skin: Negative.   Neurological: Positive for weakness and headaches. Negative for dizziness, tingling, tremors, sensory change, speech change, focal weakness and seizures.    Exam   Vitals:   07/23/20 1945 07/23/20 1948  BP: 137/78 (!) 147/70  Pulse: 96 99  Resp: 12 17  Temp:    SpO2: 91% 94%   General appearance: WDWN, NAD GCS 15 Eyes: No scleral injection Cardiovascular: Regular rate and rhythm without murmurs, rubs, gallops. No edema or variciosities. Distal pulses normal. Pulmonary: Effort normal, non-labored breathing Musculoskeletal:     Muscle tone upper extremities: Normal    Muscle tone lower extremities: Normal    Motor exam: Upper Extremities Deltoid Bicep Tricep Grip  Right 5/5 5/5 5/5 5/5  Left 5/5 5/5 5/5 5/5   Lower Extremity IP Quad PF DF EHL  Right 5/5 5/5 5/5 5/5 5/5  Left 5/5 5/5 5/5 5/5 5/5   Neurological Mental Status:    - Patient is awake, alert, oriented to person, place, month, year, and situation    - Patient is able to give a clear and coherent history.    - No signs of aphasia or neglect Cranial Nerves    - II: Visual Fields are full. PERRL    - III/IV/VI: EOMI without ptosis or diploplia.     - V:  Facial sensation is grossly normal    - VII: Facial movement is symmetric.     - VIII: hearing is intact to  voice    - X: Uvula elevates symmetrically    - XI: Shoulder shrug is symmetric.    - XII: tongue is midline without atrophy or fasciculations.  Sensory: Sensation grossly intact to LT No drift  Right parieto-occipital scalp wound: 1inch curicular area of eschar tissue with erythematous borders superiorly. Inferiorly there is an open wound that I am able to express blood from, without active bleeding. No purulent discharge. Area tender to palpation. No streaking.  Results - Imaging/Labs   Results for orders placed or performed during the hospital encounter of 07/23/20 (from the past 48 hour(s))  Basic metabolic panel     Status: Abnormal   Collection Time: 07/23/20  2:45 PM  Result Value Ref Range   Sodium 139 135 - 145 mmol/L   Potassium 3.1 (L) 3.5 - 5.1 mmol/L   Chloride 104 98 - 111 mmol/L   CO2 24 22 - 32 mmol/L   Glucose, Bld 130 (H) 70 - 99 mg/dL    Comment: Glucose reference range applies only to samples taken after fasting for at least 8 hours.   BUN 13 8 - 23 mg/dL   Creatinine, Ser 1.320.89 0.44 - 1.00 mg/dL   Calcium 8.9 8.9 - 44.010.3 mg/dL   GFR calc non Af Amer >60 >60 mL/min   GFR calc Af Amer >60 >60 mL/min   Anion gap 11 5 - 15    Comment: Performed at [facility], [address] [place], [place] [address]  CBC     Status: Abnormal   Collection Time: 07/23/20  2:45 PM  Result Value Ref Range   WBC 15.6 (H) 4.0 - 10.5 K/uL   RBC 4.01 3.87 - 5.11 MIL/uL   Hemoglobin 11.4 (L) 12.0 - 15.0 g/dL   HCT 53.636.9 36 - 46 %   MCV 92.0 80.0 - 100.0 fL   MCH 28.4 26.0 - 34.0 pg   MCHC 30.9 30.0 - 36.0 g/dL   RDW 64.414.5 03.411.5 - 74.215.5 %   Platelets 530 (H) 150 - 400 K/uL   nRBC 0.0 0.0 - 0.2 %    Comment: Performed at [facility], [address] [place], [place] [address]  Protime-INR     Status: None   Collection Time: 07/23/20  2:45 PM  Result Value Ref Range   Prothrombin Time 12.4 11.4 - 15.2 seconds   INR 1.0 0.8 - 1.2    Comment: (NOTE) INR goal varies based on device and  disease states. Performed at [facility], [address] [place], [place] [address]   APTT     Status: Abnormal   Collection Time: 07/23/20  2:45 PM  Result Value Ref Range   aPTT 38 (H) 24 - 36 seconds    Comment:        IF BASELINE aPTT IS ELEVATED, SUGGEST PATIENT RISK ASSESSMENT BE USED TO DETERMINE APPROPRIATE ANTICOAGULANT THERAPY. Performed at [facility], [address] [place], [place] [address]   SARS Coronavirus 2 by RT PCR (hospital order, performed in [facility] lab) Nasopharyngeal Nasopharyngeal Swab     Status: None   Collection Time: 07/23/20  5:40 PM   Specimen: Nasopharyngeal Swab  Result Value Ref Range   SARS Coronavirus 2 NEGATIVE NEGATIVE    Comment: (NOTE) SARS-CoV-2 target nucleic acids are NOT DETECTED.  The SARS-CoV-2 RNA is generally detectable  in upper and lower respiratory specimens during the acute phase of infection. The lowest concentration of SARS-CoV-2 viral copies this assay can detect is 250 copies / mL. A negative result does not preclude SARS-CoV-2 infection and should not be used as the sole basis for treatment or other patient management decisions.  A negative result may occur with improper specimen collection / handling, submission of specimen other than nasopharyngeal swab, presence of viral mutation(s) within the areas targeted by this assay, and inadequate number of viral copies (<250 copies / mL). A negative result must be combined with clinical observations, patient history, and epidemiological information.  Fact Sheet for Patients:   BoilerBrush.com.cy  Fact Sheet for Healthcare Providers: https://pope.com/  This test is not yet approved or  cleared by the [place] FDA and has been authorized for detection and/or diagnosis of SARS-CoV-2 by FDA under an Emergency Use Authorization (EUA).  This EUA will remain in effect (meaning this test can be used) for the  duration of the COVID-19 declaration under Section 564(b)(1) of the Act, 21 U.S.C. section 360bbb-3(b)(1), unless the authorization is terminated or revoked sooner.  Performed at [facility], [address] [place], [place] [address]   Urinalysis, Routine w reflex microscopic Urine, Clean Catch     Status: Abnormal   Collection Time: 07/23/20  6:50 PM  Result Value Ref Range   Color, Urine YELLOW YELLOW   APPearance HAZY (A) CLEAR   Specific Gravity, Urine 1.013 1.005 - 1.030   pH 5.0 5.0 - 8.0   Glucose, UA NEGATIVE NEGATIVE mg/dL   Hgb urine dipstick NEGATIVE NEGATIVE   Bilirubin Urine NEGATIVE NEGATIVE   Ketones, ur NEGATIVE NEGATIVE mg/dL   Protein, ur NEGATIVE NEGATIVE mg/dL   Nitrite NEGATIVE NEGATIVE   Leukocytes,Ua LARGE (A) NEGATIVE   RBC / HPF 0-5 0 - 5 RBC/hpf   WBC, UA >50 (H) 0 - 5 WBC/hpf   Bacteria, UA FEW (A) NONE SEEN   Squamous Epithelial / LPF 0-5 0 - 5    Comment: Performed at [facility], [address] [place], [place] [address]    CT HEAD WO CONTRAST  Result Date: 07/23/2020 CLINICAL DATA:  Fall 2 days ago recent hospitalization for subdural EXAM: CT HEAD WITHOUT CONTRAST TECHNIQUE: Contiguous axial images were obtained from the base of the skull through the vertex without intravenous contrast. COMPARISON:  CT brain 06/27/2020, 07/08/2020, 07/07/2020, 03/31/2019 FINDINGS: Brain: No acute territorial infarction or intracranial mass is visualized. Mixed density left convexity subdural hematoma with acute blood products laterally and at the vertex. This measures 13 mm maximum thickness anteriorly. Increased midline shift to the right now measuring 6 mm. Stable ventricle size. Previously noted right subdural hematoma is largely resolved. There is a small amount of subdural blood along the right tentorium. Generalized sulcal effacement on the left consistent with edema. Vascular: No hyperdense vessels. Scattered carotid vascular calcification Skull: Nondisplaced right  occipital skull fracture possibly extending to the foramen magnum, known finding. No new fracture is seen. Sinuses/Orbits: Fluid in the right sphenoid sinus without definitive skull base lucency. Other: Moderate to large right posterior parietal hematoma with laceration. IMPRESSION: 1. Mixed density left convexity subdural hematoma with acute blood products present. This measures up to 13 mm maximum thickness anteriorly. There is generalized edema within the left hemisphere with increased midline shift to the right now measuring 6 mm. 2. Previously noted right subdural hematoma is largely resolved. Small amount of subdural blood along the right 3. Nondisplaced right occipital  skull fracture possibly extending to the foramen magnum, without change. No new fracture is seen 4. Moderate to large right posterior parietal scalp laceration and hematoma Critical Value/emergent results were called by telephone at the time of interpretation on 07/23/2020 at 3:50 pm to provider Margot for [facility] , who verbally acknowledged these results. Electronically Signed   By: Jasmine Pang M.D.   On: 07/23/2020 15:50   Impression/Plan   69 y.o. female with worsening left acute on chronic SDH with 6mm MLS. She is neurologically intact.   Left acute on chronic SDH - Will require evacuation although not emergently - will admit to Neuro ICU  - q 1 hour neuro checks - Will keep NPO until Dr Franky Macho is able to check the OR schedule tomorrow  Open scalp wound - I do not believe this wound is amenable to simple closure with staples. Will have Dr Franky Macho take a look at the wound tomorrow to further evaluate. We may need to consult plastics. Wound was cleansed and dressed.  Possible UTI - UA sent for culture - Initially started on Macrobid per EDP. After discussion with pharmacy, will switch to Augmentin. Appreciate assistance.   Hypokalemia - replenish and trend  DM - SSI  HTN, HDL, Bipolar - continue home  meds  Cindra Presume, PA-C [facility]

## 2020-07-23 NOTE — ED Notes (Addendum)
Per Neurosurgery, dress laceration with abd pad and kerlex amd Wound cleaned with betadine. Pt has what appears to be have a eschar wound that has malodorous scent to it.

## 2020-07-24 ENCOUNTER — Inpatient Hospital Stay (HOSPITAL_COMMUNITY): Payer: Medicare Other | Admitting: Anesthesiology

## 2020-07-24 ENCOUNTER — Other Ambulatory Visit: Payer: Self-pay | Admitting: Neurosurgery

## 2020-07-24 ENCOUNTER — Encounter (HOSPITAL_COMMUNITY)
Admission: EM | Disposition: A | Discharge status: Home Health | Payer: Self-pay | Source: Home / Self Care | Attending: Neurosurgery

## 2020-07-24 ENCOUNTER — Encounter (HOSPITAL_COMMUNITY): Payer: Self-pay | Admitting: Neurosurgery

## 2020-07-24 DIAGNOSIS — I6203 Nontraumatic chronic subdural hemorrhage: Secondary | ICD-10-CM | POA: Diagnosis present

## 2020-07-24 HISTORY — PX: SCALP LACERATION REPAIR: SHX6089

## 2020-07-24 HISTORY — PX: BURR HOLE: SHX908

## 2020-07-24 LAB — BASIC METABOLIC PANEL
Anion gap: 11 (ref 5–15)
BUN: 10 mg/dL (ref 8–23)
CO2: 22 mmol/L (ref 22–32)
Calcium: 8.2 mg/dL — ABNORMAL LOW (ref 8.9–10.3)
Chloride: 109 mmol/L (ref 98–111)
Creatinine, Ser: 0.88 mg/dL (ref 0.44–1.00)
GFR calc Af Amer: >60 mL/min (ref >60)
GFR calc non Af Amer: >60 mL/min (ref >60)
Glucose, Bld: 73 mg/dL (ref 70–99)
Potassium: 4 mmol/L (ref 3.5–5.1)
Sodium: 142 mmol/L (ref 135–145)

## 2020-07-24 LAB — GLUCOSE, CAPILLARY
Glucose-Capillary: 75 mg/dL (ref 70–99)
Glucose-Capillary: 72 mg/dL (ref 70–99)
Glucose-Capillary: 103 mg/dL — ABNORMAL HIGH (ref 70–99)
Glucose-Capillary: 112 mg/dL — ABNORMAL HIGH (ref 70–99)
Glucose-Capillary: 135 mg/dL — ABNORMAL HIGH (ref 70–99)

## 2020-07-24 LAB — CBG MONITORING, ED
Glucose-Capillary: 81 mg/dL (ref 70–99)
Glucose-Capillary: 84 mg/dL (ref 70–99)

## 2020-07-24 LAB — HEMOGLOBIN A1C
Hgb A1c MFr Bld: 6.5 % — ABNORMAL HIGH (ref 4.8–5.6)
Mean Plasma Glucose: 139.85 mg/dL

## 2020-07-24 SURGERY — CREATION, CRANIAL BURR HOLE
Anesthesia: General | Site: Head

## 2020-07-24 MED ORDER — FENTANYL CITRATE (PF) 100 MCG/2ML IJ SOLN
INTRAMUSCULAR | Status: DC | PRN
Start: 2020-07-24 — End: 2020-07-27
  Administered 2020-07-24: 100 ug via INTRAVENOUS
  Administered 2020-07-24 (×2): 50 ug via INTRAVENOUS

## 2020-07-24 MED ORDER — DEXAMETHASONE SODIUM PHOSPHATE 10 MG/ML IJ SOLN
INTRAMUSCULAR | Status: DC | PRN
  Administered 2020-07-24: 10 mg via INTRAVENOUS

## 2020-07-24 MED ORDER — ONDANSETRON HCL 4 MG/2ML IJ SOLN: 4.0000 mg | Freq: Once | INTRAMUSCULAR | Status: DC | PRN

## 2020-07-24 MED ORDER — HYDROMORPHONE HCL 1 MG/ML IJ SOLN: 0.2500 mg | INTRAMUSCULAR | Status: DC | PRN

## 2020-07-24 MED ORDER — BISACODYL 5 MG PO TBEC: 5.0000 mg | DELAYED_RELEASE_TABLET | Freq: Every day | ORAL | Status: DC | PRN

## 2020-07-24 MED ORDER — NYSTATIN 100000 UNIT/GM EX CREA
TOPICAL_CREAM | Freq: Two times a day (BID) | CUTANEOUS | Status: DC
  Administered 2020-07-25 – 2020-08-01 (×4): 1 via TOPICAL
  Filled 2020-07-24 (×2): qty 15

## 2020-07-24 MED ORDER — OXYCODONE HCL 5 MG/5ML PO SOLN: 5.0000 mg | Freq: Once | ORAL | Status: DC | PRN

## 2020-07-24 MED ORDER — CHLORHEXIDINE GLUCONATE 0.12 % MT SOLN
OROMUCOSAL | Status: AC
  Administered 2020-07-24: 15 mL via OROMUCOSAL
  Filled 2020-07-24: qty 15

## 2020-07-24 MED ORDER — ORAL CARE MOUTH RINSE: 15.0000 mL | Freq: Once | OROMUCOSAL | Status: AC

## 2020-07-24 MED ORDER — BACITRACIN ZINC 500 UNIT/GM EX OINT
TOPICAL_OINTMENT | CUTANEOUS | Status: AC
  Filled 2020-07-24: qty 28.35

## 2020-07-24 MED ORDER — MAGNESIUM CITRATE PO SOLN: 1.0000 | Freq: Once | ORAL | Status: DC | PRN

## 2020-07-24 MED ORDER — CEFAZOLIN SODIUM-DEXTROSE 2-3 GM-%(50ML) IV SOLR
INTRAVENOUS | Status: DC | PRN
  Administered 2020-07-24: 2 g via INTRAVENOUS

## 2020-07-24 MED ORDER — PROMETHAZINE HCL 25 MG/ML IJ SOLN: 6.2500 mg | INTRAMUSCULAR | Status: DC | PRN

## 2020-07-24 MED ORDER — DEXTROSE 50 % IV SOLN
INTRAVENOUS | Status: AC
  Filled 2020-07-24: qty 50

## 2020-07-24 MED ORDER — ONDANSETRON HCL 4 MG/2ML IJ SOLN
INTRAMUSCULAR | Status: DC | PRN
  Administered 2020-07-24: 4 mg via INTRAVENOUS

## 2020-07-24 MED ORDER — FENTANYL CITRATE (PF) 250 MCG/5ML IJ SOLN
INTRAMUSCULAR | Status: AC
  Filled 2020-07-24: qty 5

## 2020-07-24 MED ORDER — FLUTICASONE PROPIONATE 50 MCG/ACT NA SUSP
2.0000 | Freq: Every day | NASAL | Status: DC
  Administered 2020-07-28 – 2020-08-02 (×5): 2 via NASAL
  Filled 2020-07-24: qty 16

## 2020-07-24 MED ORDER — PROPOFOL 10 MG/ML IV BOLUS
INTRAVENOUS | Status: AC
  Filled 2020-07-24: qty 20

## 2020-07-24 MED ORDER — 0.9 % SODIUM CHLORIDE (POUR BTL) OPTIME
TOPICAL | Status: DC | PRN
  Administered 2020-07-24: 1000 mL

## 2020-07-24 MED ORDER — CHLORHEXIDINE GLUCONATE CLOTH 2 % EX PADS
6.0000 | MEDICATED_PAD | Freq: Every day | CUTANEOUS | Status: DC
  Administered 2020-07-24 – 2020-08-02 (×9): 6 via TOPICAL

## 2020-07-24 MED ORDER — MEPERIDINE HCL 25 MG/ML IJ SOLN: 6.2500 mg | INTRAMUSCULAR | Status: DC | PRN

## 2020-07-24 MED ORDER — NALOXONE HCL 0.4 MG/ML IJ SOLN: 0.0800 mg | INTRAMUSCULAR | Status: DC | PRN

## 2020-07-24 MED ORDER — THROMBIN 20000 UNITS EX SOLR
CUTANEOUS | Status: AC
  Filled 2020-07-24: qty 20000

## 2020-07-24 MED ORDER — PHENYLEPHRINE HCL (PRESSORS) 10 MG/ML IV SOLN
INTRAVENOUS | Status: DC | PRN
  Administered 2020-07-24 (×3): 80 ug via INTRAVENOUS

## 2020-07-24 MED ORDER — THROMBIN 20000 UNITS EX SOLR: CUTANEOUS | Status: DC | PRN

## 2020-07-24 MED ORDER — PROPOFOL 10 MG/ML IV BOLUS
INTRAVENOUS | Status: DC | PRN
  Administered 2020-07-24: 100 mg via INTRAVENOUS

## 2020-07-24 MED ORDER — FENTANYL CITRATE (PF) 100 MCG/2ML IJ SOLN: 25.0000 ug | INTRAMUSCULAR | Status: DC | PRN

## 2020-07-24 MED ORDER — OXYCODONE HCL 5 MG PO TABS: 5.0000 mg | ORAL_TABLET | Freq: Once | ORAL | Status: DC | PRN

## 2020-07-24 MED ORDER — HEPARIN SODIUM (PORCINE) 5000 UNIT/ML IJ SOLN
5000.0000 [IU] | Freq: Three times a day (TID) | INTRAMUSCULAR | Status: DC
  Administered 2020-07-25 – 2020-08-02 (×26): 5000 [IU] via SUBCUTANEOUS
  Filled 2020-07-24 (×24): qty 1

## 2020-07-24 MED ORDER — LIDOCAINE HCL (CARDIAC) PF 100 MG/5ML IV SOSY
PREFILLED_SYRINGE | INTRAVENOUS | Status: DC | PRN
  Administered 2020-07-24: 40 mg via INTRAVENOUS

## 2020-07-24 MED ORDER — DOCUSATE SODIUM 100 MG PO CAPS
100.0000 mg | ORAL_CAPSULE | Freq: Two times a day (BID) | ORAL | Status: DC
  Administered 2020-07-25 – 2020-08-02 (×12): 100 mg via ORAL
  Filled 2020-07-24 (×14): qty 1

## 2020-07-24 MED ORDER — AMISULPRIDE (ANTIEMETIC) 5 MG/2ML IV SOLN: 10.0000 mg | Freq: Once | INTRAVENOUS | Status: DC | PRN

## 2020-07-24 MED ORDER — MORPHINE SULFATE (PF) 2 MG/ML IV SOLN
1.0000 mg | INTRAVENOUS | Status: DC | PRN
  Administered 2020-07-24: 2 mg via INTRAVENOUS
  Administered 2020-07-27: 1 mg via INTRAVENOUS
  Filled 2020-07-24 (×2): qty 1

## 2020-07-24 MED ORDER — SUGAMMADEX SODIUM 200 MG/2ML IV SOLN
INTRAVENOUS | Status: DC | PRN
  Administered 2020-07-24: 200 mg via INTRAVENOUS

## 2020-07-24 MED ORDER — ROCURONIUM BROMIDE 100 MG/10ML IV SOLN
INTRAVENOUS | Status: DC | PRN
  Administered 2020-07-24: 50 mg via INTRAVENOUS
  Administered 2020-07-24: 20 mg via INTRAVENOUS

## 2020-07-24 MED ORDER — CHLORHEXIDINE GLUCONATE 0.12 % MT SOLN: 15.0000 mL | Freq: Once | OROMUCOSAL | Status: AC

## 2020-07-24 MED ORDER — LACTATED RINGERS IV SOLN: INTRAVENOUS | Status: DC

## 2020-07-24 MED ORDER — HEMOSTATIC AGENTS (NO CHARGE) OPTIME
TOPICAL | Status: DC | PRN
  Administered 2020-07-24: 1 via TOPICAL

## 2020-07-24 MED ORDER — LIDOCAINE-EPINEPHRINE 0.5 %-1:200000 IJ SOLN
INTRAMUSCULAR | Status: AC
  Filled 2020-07-24: qty 1

## 2020-07-24 MED ORDER — POTASSIUM CHLORIDE IN NACL 20-0.9 MEQ/L-% IV SOLN
INTRAVENOUS | Status: DC
  Filled 2020-07-24 (×17): qty 1000

## 2020-07-24 MED ORDER — LIDOCAINE 2% (20 MG/ML) 5 ML SYRINGE
INTRAMUSCULAR | Status: AC
  Filled 2020-07-24: qty 5

## 2020-07-24 MED ORDER — DEXTROSE 50 % IV SOLN
25.0000 mL | Freq: Once | INTRAVENOUS | Status: AC
  Administered 2020-07-24: 25 mL via INTRAVENOUS

## 2020-07-24 SURGICAL SUPPLY — 59 items
BENZOIN TINCTURE PRP APPL 2/3 (GAUZE/BANDAGES/DRESSINGS) IMPLANT
BLADE CLIPPER SURG (BLADE) ×4 IMPLANT
BNDG GAUZE ELAST 4 BULKY (GAUZE/BANDAGES/DRESSINGS) IMPLANT
BUR ACORN 6.0 PRECISION (BURR) ×2 IMPLANT
BUR MATCHSTICK NEURO 3.0 LAGG (BURR) IMPLANT
BUR SPIRAL ROUTER 2.3 (BUR) IMPLANT
CABLE BIPOLOR RESECTION CORD (MISCELLANEOUS) ×2 IMPLANT
CANISTER SUCT 3000ML PPV (MISCELLANEOUS) ×2 IMPLANT
CARTRIDGE OIL MAESTRO DRILL (MISCELLANEOUS) ×1 IMPLANT
CATH VENTRIC 35X38 W/TROCAR LG (CATHETERS) ×2 IMPLANT
CLIP VESOCCLUDE MED 6/CT (CLIP) IMPLANT
COVER WAND RF STERILE (DRAPES) ×2 IMPLANT
DIFFUSER DRILL AIR PNEUMATIC (MISCELLANEOUS) ×2 IMPLANT
DRAPE NEUROLOGICAL W/INCISE (DRAPES) ×2 IMPLANT
DRAPE SURG 17X23 STRL (DRAPES) IMPLANT
DRAPE WARM FLUID 44X44 (DRAPES) ×2 IMPLANT
DRSG TELFA 3X8 NADH (GAUZE/BANDAGES/DRESSINGS) ×2 IMPLANT
DURAPREP 6ML APPLICATOR 50/CS (WOUND CARE) ×2 IMPLANT
ELECT REM PT RETURN 9FT ADLT (ELECTROSURGICAL) ×2
ELECTRODE REM PT RTRN 9FT ADLT (ELECTROSURGICAL) ×1 IMPLANT
EVACUATOR SILICONE 100CC (DRAIN) IMPLANT
GAUZE SPONGE 4X4 12PLY STRL (GAUZE/BANDAGES/DRESSINGS) ×2 IMPLANT
GLOVE ECLIPSE 6.5 STRL STRAW (GLOVE) ×2 IMPLANT
GLOVE EXAM NITRILE XL STR (GLOVE) IMPLANT
GOWN STRL REUS W/ TWL LRG LVL3 (GOWN DISPOSABLE) ×2 IMPLANT
GOWN STRL REUS W/ TWL XL LVL3 (GOWN DISPOSABLE) IMPLANT
GOWN STRL REUS W/TWL 2XL LVL3 (GOWN DISPOSABLE) IMPLANT
GOWN STRL REUS W/TWL LRG LVL3 (GOWN DISPOSABLE) ×4
GOWN STRL REUS W/TWL XL LVL3 (GOWN DISPOSABLE)
HEMOSTAT SURGICEL 2X14 (HEMOSTASIS) IMPLANT
KIT BASIN OR (CUSTOM PROCEDURE TRAY) ×2 IMPLANT
KIT DRAIN CSF ACCUDRAIN (MISCELLANEOUS) ×2 IMPLANT
KIT SEPS CRANIAL ACCESS (MISCELLANEOUS) IMPLANT
KIT TURNOVER KIT B (KITS) ×2 IMPLANT
NEEDLE HYPO 25X1 1.5 SAFETY (NEEDLE) ×2 IMPLANT
NS IRRIG 1000ML POUR BTL (IV SOLUTION) ×2 IMPLANT
OIL CARTRIDGE MAESTRO DRILL (MISCELLANEOUS) ×2
PACK CRANIOTOMY CUSTOM (CUSTOM PROCEDURE TRAY) ×2 IMPLANT
PATTIES SURGICAL .5 X.5 (GAUZE/BANDAGES/DRESSINGS) IMPLANT
PATTIES SURGICAL .5 X3 (DISPOSABLE) IMPLANT
PATTIES SURGICAL 1X1 (DISPOSABLE) IMPLANT
PENCIL BUTTON HOLSTER BLD 10FT (ELECTRODE) ×2 IMPLANT
SPONGE NEURO XRAY DETECT 1X3 (DISPOSABLE) IMPLANT
SPONGE SURGIFOAM ABS GEL 100 (HEMOSTASIS) ×2 IMPLANT
STAPLER VISISTAT 35W (STAPLE) ×2 IMPLANT
SUT ETHILON 2 0 FS 18 (SUTURE) ×2 IMPLANT
SUT ETHILON 2 0 PSLX (SUTURE) ×6 IMPLANT
SUT ETHILON 3 0 FSL (SUTURE) IMPLANT
SUT ETHILON 3 0 PS 1 (SUTURE) IMPLANT
SUT NURALON 4 0 TR CR/8 (SUTURE) ×6 IMPLANT
SUT STEEL 0 (SUTURE)
SUT STEEL 0 18XMFL TIE 17 (SUTURE) IMPLANT
SUT VIC AB 2-0 CT2 18 VCP726D (SUTURE) ×4 IMPLANT
TOWEL GREEN STERILE (TOWEL DISPOSABLE) ×2 IMPLANT
TOWEL GREEN STERILE FF (TOWEL DISPOSABLE) ×2 IMPLANT
TRAY FOLEY MTR SLVR 16FR STAT (SET/KITS/TRAYS/PACK) ×2 IMPLANT
TUBE CONNECTING 12X1/4 (SUCTIONS) ×2 IMPLANT
TUBE CONNECTING 20X1/4 (TUBING) ×2 IMPLANT
WATER STERILE IRR 1000ML POUR (IV SOLUTION) ×2 IMPLANT

## 2020-07-24 NOTE — Anesthesia Preprocedure Evaluation (Addendum)
Anesthesia Evaluation  Patient identified by MRN, date of birth, ID band Patient awake    Reviewed: Allergy & Precautions, NPO status , Patient's Chart, lab work & pertinent test results, Unable to perform ROS - Chart review only  Airway Mallampati: II  TM Distance: >3 FB Neck ROM: Full    Dental no notable dental hx.    Pulmonary neg pulmonary ROS, Patient abstained from smoking.,    Pulmonary exam normal breath sounds clear to auscultation       Cardiovascular hypertension, Pt. on medications negative cardio ROS Normal cardiovascular exam Rhythm:Regular Rate:Normal     Neuro/Psych  Headaches, PSYCHIATRIC DISORDERS Anxiety Depression Bipolar Disorder negative neurological ROS  negative psych ROS   GI/Hepatic negative GI ROS, Neg liver ROS, GERD  ,  Endo/Other  negative endocrine ROSdiabetes  Renal/GU Renal diseasenegative Renal ROS  negative genitourinary   Musculoskeletal negative musculoskeletal ROS (+)   Abdominal   Peds negative pediatric ROS (+)  Hematology negative hematology ROS (+) Blood dyscrasia, anemia ,   Anesthesia Other Findings   Reproductive/Obstetrics negative OB ROS                            Anesthesia Physical Anesthesia Plan  ASA: III  Anesthesia Plan: General   Post-op Pain Management:    Induction: Intravenous  PONV Risk Score and Plan: 3 and Ondansetron, Dexamethasone, Treatment may vary due to age or medical condition and Midazolam  Airway Management Planned: Oral ETT  Additional Equipment:   Intra-op Plan:   Post-operative Plan: Possible Post-op intubation/ventilation  Informed Consent: I have reviewed the patients History and Physical, chart, labs and discussed the procedure including the risks, benefits and alternatives for the proposed anesthesia with the patient or authorized representative who has indicated his/her understanding and acceptance.      Dental advisory given  Plan Discussed with: CRNA  Anesthesia Plan Comments:        Anesthesia Quick Evaluation

## 2020-07-24 NOTE — Op Note (Signed)
07/24/2020  7:04 PM  PATIENT:  Erin Donaldson  69 y.o. female Whom fell sustaining a complex laceration in the right occipital parietal region. She was also found to have a left chronic subdural hematoma PRE-OPERATIVE DIAGNOSIS:  Subdural hemorrhage left, Scalp laceration right  POST-OPERATIVE DIAGNOSIS:  Subdural hemorrhage left, Scalp laceration right  PROCEDURE:  Procedure(s): Burr hole for evacuation of chronic subdural with scalp laceration repair SCALP LACERATION REPAIR  SURGEON: Surgeon(s): Coletta Memos, MD  ASSISTANTS:none  ANESTHESIA:   general  EBL:  No intake/output data recorded.  BLOOD ADMINISTERED:none  CELL SAVER GIVEN:none  COUNT:per nursing  DRAINS: Ventriculostomy Drain in the subdural space   SPECIMEN:  No Specimen  DICTATION: Erin Donaldson was taken to the operating room, intubated, and placed under a general anesthetic without difficulty. She was positioned supine with her head on a headrest. Her head was shaved, prepped and draped in a sterile manner.  I cleaned the scalp laceration aggressively and closed it loosely. There was obvious tissue loss, and the wound was debrided. The wound was approximately 4x3 cm, and did not penetrate the musculature. I used nylon sutures to approximate the wound edges.  We then placed the head looking up, shaved and prepped again. I made a linear coronal incision, I used a hand drill, and created a twist drill hole at a slight caudal angle. I then placed a ventricular catheter in the subdural space angling the catheter caudally. I closed the entry site wound. I tunneled the catheter and secured the catheter with a suture on the scalp. I placed sterile dressings on both wounds.  She was extubated and moving all extremities well.  PLAN OF CARE: Admit to inpatient   PATIENT DISPOSITION:  PACU - hemodynamically stable.   Delay start of Pharmacological VTE agent (>24hrs) due to surgical blood loss or risk of bleeding:   yes

## 2020-07-24 NOTE — Anesthesia Procedure Notes (Signed)
Procedure Name: Intubation Date/Time: 07/24/2020 4:21 PM Performed by: Kiala Faraj T, CRNA Pre-anesthesia Checklist: Patient identified, Emergency Drugs available, Suction available and Patient being monitored Patient Re-evaluated:Patient Re-evaluated prior to induction Oxygen Delivery Method: Circle system utilized Preoxygenation: Pre-oxygenation with 100% oxygen Induction Type: IV induction Ventilation: Mask ventilation without difficulty Laryngoscope Size: Miller and 2 Grade View: Grade I Tube type: Oral Tube size: 7.5 mm Number of attempts: 1 Airway Equipment and Method: Stylet and Oral airway Placement Confirmation: ETT inserted through vocal cords under direct vision,  positive ETCO2 and breath sounds checked- equal and bilateral Secured at: 21 cm Tube secured with: Tape Dental Injury: Teeth and Oropharynx as per pre-operative assessment

## 2020-07-24 NOTE — ED Notes (Signed)
Pt repositioned in bed with HOB at about 45 degrees. Pt A&Ox4, NAD, VSS. Pt endorses headache at this time. Small amount of bleeding to posterior scalp noted through dressing. Speech clear.

## 2020-07-24 NOTE — ED Notes (Signed)
Pt desat 88%-89% on RA, pt placed on 2L O2 via [place] pt tolerated well, SPO2 improved to 97%.

## 2020-07-25 ENCOUNTER — Encounter (HOSPITAL_COMMUNITY): Payer: Self-pay | Admitting: Neurosurgery

## 2020-07-25 LAB — CBC
HCT: 32.5 % — ABNORMAL LOW (ref 36.0–46.0)
Hemoglobin: 9.9 g/dL — ABNORMAL LOW (ref 12.0–15.0)
MCH: 28.2 pg (ref 26.0–34.0)
MCHC: 30.5 g/dL (ref 30.0–36.0)
MCV: 92.6 fL (ref 80.0–100.0)
Platelets: 369 10*3/uL (ref 150–400)
RBC: 3.51 MIL/uL — ABNORMAL LOW (ref 3.87–5.11)
RDW: 14 % (ref 11.5–15.5)
WBC: 8.2 10*3/uL (ref 4.0–10.5)
nRBC: 0 % (ref 0.0–0.2)

## 2020-07-25 LAB — GLUCOSE, CAPILLARY
Glucose-Capillary: 72 mg/dL (ref 70–99)
Glucose-Capillary: 72 mg/dL (ref 70–99)
Glucose-Capillary: 71 mg/dL (ref 70–99)
Glucose-Capillary: 86 mg/dL (ref 70–99)

## 2020-07-25 LAB — CREATININE, SERUM
Creatinine, Ser: 0.78 mg/dL (ref 0.44–1.00)
GFR calc Af Amer: >60 mL/min (ref >60)
GFR calc non Af Amer: >60 mL/min (ref >60)

## 2020-07-25 MED FILL — Thrombin For Soln Kit 20000 Unit: CUTANEOUS | Qty: 1 | Status: AC

## 2020-07-25 NOTE — Progress Notes (Signed)
Patient ID: Erin Donaldson, female   DOB: Mar 17, 1951, 69 y.o.   MRN: [id number] [name] is working well.  Wound is closed, no signs of infection. Mid portion of the incision is tenous.  Moving all extremities Will continue to be at bedrest.

## 2020-07-25 NOTE — Anesthesia Postprocedure Evaluation (Signed)
Anesthesia Post Note  Patient: [name]  Procedure(s) Performed: Ines Bloomer hole for evacuation of chronic subdural with scalp laceration repair ([name] Head) SCALP LACERATION REPAIR ([name] Head)     Patient location during evaluation: PACU Anesthesia Type: General Level of consciousness: awake and alert Pain management: pain level controlled Vital Signs Assessment: post-procedure vital signs reviewed and stable Respiratory status: spontaneous breathing, nonlabored ventilation, respiratory function stable and patient connected to nasal cannula oxygen Cardiovascular status: blood pressure returned to baseline and stable Postop Assessment: no apparent nausea or vomiting Anesthetic complications: no   No complications documented.  Last Vitals:  Vitals:   07/25/20 0500 07/25/20 0600  BP: 117/70 (!) 115/94  Pulse: 89 (!) 106  Resp: 14 18  Temp:    SpO2: 92% 95%    Last Pain:  Vitals:   07/25/20 0400  TempSrc: Axillary  PainSc: Asleep                 Krystal Delduca S

## 2020-07-25 NOTE — TOC CAGE-AID Note (Signed)
Transition of Care [facility]) - CAGE-AID Screening   Patient Details  Name: Erin Donaldson MRN: [id number] Date of Birth: 1950-12-07  Transition of Care [facility]) CM/SW Contact:    Jimmy Picket, LCSWA Phone Number: 07/25/2020, 11:45 AM   Clinical Narrative:  Pt was unable to participate in assessment due to being oriented to self.   CAGE-AID Screening: Substance Abuse Screening unable to be completed due to: : Patient unable to participate            Isabella Stalling Clinical Social Worker [phone]

## 2020-07-26 ENCOUNTER — Telehealth: Payer: Self-pay | Admitting: Pharmacist

## 2020-07-26 ENCOUNTER — Ambulatory Visit (INDEPENDENT_AMBULATORY_CARE_PROVIDER_SITE_OTHER): Payer: Medicare Other

## 2020-07-26 DIAGNOSIS — E876 Hypokalemia: Secondary | ICD-10-CM

## 2020-07-26 DIAGNOSIS — Z9181 History of falling: Secondary | ICD-10-CM

## 2020-07-26 DIAGNOSIS — S065X0D Traumatic subdural hemorrhage without loss of consciousness, subsequent encounter: Secondary | ICD-10-CM | POA: Diagnosis not present

## 2020-07-26 DIAGNOSIS — K589 Irritable bowel syndrome without diarrhea: Secondary | ICD-10-CM

## 2020-07-26 DIAGNOSIS — E785 Hyperlipidemia, unspecified: Secondary | ICD-10-CM

## 2020-07-26 DIAGNOSIS — I6203 Nontraumatic chronic subdural hemorrhage: Secondary | ICD-10-CM

## 2020-07-26 DIAGNOSIS — J9811 Atelectasis: Secondary | ICD-10-CM

## 2020-07-26 DIAGNOSIS — D649 Anemia, unspecified: Secondary | ICD-10-CM

## 2020-07-26 DIAGNOSIS — S065X9A Traumatic subdural hemorrhage with loss of consciousness of unspecified duration, initial encounter: Secondary | ICD-10-CM

## 2020-07-26 DIAGNOSIS — F319 Bipolar disorder, unspecified: Secondary | ICD-10-CM

## 2020-07-26 DIAGNOSIS — S020XXD Fracture of vault of skull, subsequent encounter for fracture with routine healing: Secondary | ICD-10-CM | POA: Diagnosis not present

## 2020-07-26 DIAGNOSIS — F0280 Dementia in other diseases classified elsewhere without behavioral disturbance: Secondary | ICD-10-CM

## 2020-07-26 DIAGNOSIS — E119 Type 2 diabetes mellitus without complications: Secondary | ICD-10-CM

## 2020-07-26 DIAGNOSIS — S02119D Unspecified fracture of occiput, subsequent encounter for fracture with routine healing: Secondary | ICD-10-CM | POA: Diagnosis not present

## 2020-07-26 DIAGNOSIS — I10 Essential (primary) hypertension: Secondary | ICD-10-CM

## 2020-07-26 LAB — GLUCOSE, CAPILLARY
Glucose-Capillary: 68 mg/dL — ABNORMAL LOW (ref 70–99)
Glucose-Capillary: 98 mg/dL (ref 70–99)
Glucose-Capillary: 82 mg/dL (ref 70–99)
Glucose-Capillary: 79 mg/dL (ref 70–99)
Glucose-Capillary: 90 mg/dL (ref 70–99)
Glucose-Capillary: 82 mg/dL (ref 70–99)

## 2020-07-26 LAB — URINE CULTURE: Culture: >=100000 — AB

## 2020-07-26 MED ORDER — ORAL CARE MOUTH RINSE
15.0000 mL | Freq: Two times a day (BID) | OROMUCOSAL | Status: DC
  Administered 2020-07-26 – 2020-08-02 (×13): 15 mL via OROMUCOSAL

## 2020-07-26 MED ORDER — DEXTROSE 50 % IV SOLN
12.5000 g | INTRAVENOUS | Status: AC
  Administered 2020-07-26: 12.5 g via INTRAVENOUS

## 2020-07-26 MED ORDER — DEXTROSE 50 % IV SOLN
INTRAVENOUS | Status: AC
  Filled 2020-07-26: qty 50

## 2020-07-26 NOTE — Progress Notes (Signed)
Dr. Franky Macho notified that subdural drain output has stopped since last night.  Neuro exam remains the same.

## 2020-07-26 NOTE — Evaluation (Signed)
Occupational Therapy Evaluation Patient Details Name: Erin Donaldson MRN: [id number] DOB: August 29, 1951 Today's Date: 07/26/2020    History of Present Illness Pt is a 69 y/o female with PMH of HTN, bipolar disorder, DM, IBS presenting after a fall (lost her balance and fell backwards hitting her head). REports neck and R shoulder pain, xrays negative. Head CT reveals bilateral acute subdural hematomas, Larger on the L with very little midline shift and non displaced occipital skull fx. Pt readmitted 9/8 for burhole evacuation of chronic subdural on the Left.    Clinical Impression   Pt admitted with the above diagnosis and has the deficits listed below. Pt would benefit from continued OT to address basic mobility and basic cognition to attempt to increase independence with basic adls and adl transfers. Pt has family to assist and assisted PTA but pt is at much different level of care now.  Feel pt would benefit from SNF rehab if progress is slow before returning home with family.      Follow Up Recommendations  SNF;Supervision/Assistance - 24 hour    Equipment Recommendations  None recommended by OT    Recommendations for Other Services       Precautions / Restrictions Precautions Precautions: Fall Precaution Comments: h/o falls.  pt with mutiple lines and drain from head. Restrictions Weight Bearing Restrictions: No      Mobility Bed Mobility Overal bed mobility: Needs Assistance Bed Mobility: Supine to Sit;Sit to Supine   Sidelying to sit: Total assist;+2 for physical assistance Supine to sit: Total assist;+2 for physical assistance Sit to supine: Total assist;+2 for physical assistance   General bed mobility comments: Pt not assisting  Transfers Overall transfer level: Needs assistance Equipment used: 2 person hand held assist Transfers: Sit to/from Stand Sit to Stand: Total assist;+2 physical assistance         General transfer comment: pt did initiate a stand but  total assist thereafter    Balance Overall balance assessment: Needs assistance Sitting-balance support: Feet supported Sitting balance-Leahy Scale: Zero     Standing balance support: Bilateral upper extremity supported Standing balance-Leahy Scale: Zero Standing balance comment: very little assist from pt                           ADL either performed or assessed with clinical judgement   ADL Overall ADL's : Needs assistance/impaired Eating/Feeding: Total assistance;Sitting   Grooming: Total assistance;Sitting   Upper Body Bathing: Total assistance;Sitting   Lower Body Bathing: Total assistance;Bed level   Upper Body Dressing : Total assistance;Sitting   Lower Body Dressing: Total assistance;Bed level   Toilet Transfer: Total assistance;+2 for physical assistance   Toileting- Clothing Manipulation and Hygiene: Total assistance;Bed level       Functional mobility during ADLs: Total assistance;+2 for physical assistance General ADL Comments: Pt unable to follow commands or compete any adls or assist much with mobility.     Vision Baseline Vision/History: Wears glasses Wears Glasses: At all times Patient Visual Report: Other (comment) (unabl to assess.  Eyes closed during session) Vision Assessment?: Vision impaired- to be further tested in functional context     Perception Perception Perception Tested?: No   Praxis Praxis Praxis tested?: Not tested    Pertinent Vitals/Pain Pain Assessment: Faces Faces Pain Scale: Hurts a little bit Pain Location: movement of certain limbs. not consistent Pain Descriptors / Indicators: Discomfort;Guarding;Grimacing Pain Intervention(s): Limited activity within patient's tolerance;Monitored during session;Repositioned     Hand  Dominance     Extremity/Trunk Assessment Upper Extremity Assessment Upper Extremity Assessment: RUE deficits/detail;LUE deficits/detail RUE Deficits / Details: Spontaneous movement noted.  Pt squeezed to command x2.  Strength:  at least...shoulder 1/5, biceps/triceps 2/5, grip 2/5. RUE Sensation:  (unable to assess) RUE Coordination: decreased fine motor;decreased gross motor LUE Deficits / Details: moved spontaeously better than the right.  At least:  shoulder 2/5, biceps/triceps 3+/5, grip 3/5.  LUE Sensation:  (unable) LUE Coordination: decreased fine motor;decreased gross motor   Lower Extremity Assessment Lower Extremity Assessment: Defer to PT evaluation       Communication Communication Communication: Other (comment) (Pt not talking during eval. Very lethargic)   Cognition Arousal/Alertness: Lethargic Behavior During Therapy:  (lethargic) Overall Cognitive Status: Impaired/Different from baseline Area of Impairment: Orientation;Attention;Following commands;Memory;Safety/judgement;Awareness;Problem solving                 Orientation Level:  (not answering any orientation questions) Current Attention Level: Focused Memory: Decreased recall of precautions;Decreased short-term memory Following Commands: Follows one step commands inconsistently Safety/Judgement: Decreased awareness of safety;Decreased awareness of deficits Awareness: Intellectual Problem Solving: Slow processing;Decreased initiation;Difficulty sequencing;Requires verbal cues;Requires tactile cues General Comments: Pt very lethargic today.  Following some one step commands but not all.  Not answering any questions.     General Comments  Pt very lethargic with very little participation.    Exercises     Shoulder Instructions      Home Living Family/patient expects to be discharged to:: Private residence Living Arrangements: Alone;Children Available Help at Discharge: Family;Available 24 hours/day Type of Home: House Home Access: Stairs to enter [employer] of Steps: 1 Entrance Stairs-Rails: None Home Layout: One level     Bathroom Shower/Tub: Scientist, research (life sciences): Standard Bathroom Accessibility: Yes   Home Equipment: Environmental consultant - 4 wheels;Cane - single point   Additional Comments: sister in law plans to stay to assist patient as needed      Prior Functioning/Environment Level of Independence: Independent with assistive device(s)        Comments: driving, caring for 51 y/o grandson; reports using cane PTA         OT Problem List: Decreased strength;Decreased range of motion;Decreased activity tolerance;Impaired balance (sitting and/or standing);Decreased coordination;Decreased cognition;Decreased safety awareness;Decreased knowledge of use of DME or AE;Decreased knowledge of precautions;Impaired tone;Impaired UE functional use;Pain;Increased edema      OT Treatment/Interventions: Self-care/ADL training;Neuromuscular education;DME and/or AE instruction;Therapeutic activities    OT Goals(Current goals can be found in the care plan section) Acute Rehab OT Goals Patient Stated Goal: none stated OT Goal Formulation: Patient unable to participate in goal setting Time For Goal Achievement: 08/09/20 Potential to Achieve Goals: Fair ADL Goals Pt Will Perform Eating: with min assist;sitting Pt Will Perform Grooming: with min assist;sitting Additional ADL Goal #1: Pt will sit on side of bed for 5 minutes min assist while attempting grooming task with min assist for balance. Additional ADL Goal #2: Pt will follow one step commands consistently with 100% accuracy to increase participation in  therapy. Additional ADL Goal #3: Pt will stand for 30 seconds with min assist in prep for adls in standing.  OT Frequency: Min 2X/week   Barriers to D/C:    chart states family can care for her.  Unsure the level of care they can handle.       Co-evaluation PT/OT/SLP Co-Evaluation/Treatment: Yes Reason for Co-Treatment: Complexity of the patient's impairments (multi-system involvement);Necessary to address cognition/behavior during functional  activity PT goals addressed  during session: Mobility/safety with mobility OT goals addressed during session: ADL's and self-care      AM-PAC OT "6 Clicks" Daily Activity     Outcome Measure Help from another person eating meals?: Total Help from another person taking care of personal grooming?: Total Help from another person toileting, which includes using toliet, bedpan, or urinal?: Total Help from another person bathing (including washing, rinsing, drying)?: Total Help from another person to put on and taking off regular upper body clothing?: Total Help from another person to put on and taking off regular lower body clothing?: Total 6 Click Score: 6   End of Session Equipment Utilized During Treatment: Oxygen Nurse Communication: Mobility status  Activity Tolerance: Patient limited by lethargy Patient left: in bed;with call bell/phone within reach;with family/visitor present  OT Visit Diagnosis: Unsteadiness on feet (R26.81);Other symptoms and signs involving cognitive function                Time: 0831-0900 OT Time Calculation (min): 29 min Charges:  OT General Charges $OT Visit: 1 Visit OT Evaluation $OT Eval Moderate Complexity: 1 Mod  Hope Budds 07/26/2020, 10:06 AM

## 2020-07-26 NOTE — Evaluation (Signed)
Physical Therapy Evaluation Patient Details Name: Erin Donaldson MRN: [id number] DOB: 27-May-1951 Today's Date: 07/26/2020   History of Present Illness  Pt is a 69 y/o female with PMH of HTN, bipolar disorder, DM, IBS presenting after a fall (lost her balance and fell backwards hitting her head). REports neck and R shoulder pain, xrays negative. Head CT reveals bilateral acute subdural hematomas, Larger on the L with very little midline shift and non displaced occipital skull fx. Pt readmitted 9/8 for burhole evacuation of chronic subdural on the Left.   Clinical Impression  Pt presents to PT with deficits in functional mobility, gait, balance, endurance, strength, power, cognition, level of arousal. Pt requires max-totalA for all mobility at this time and demonstrates difficulty consistently following PT commands. Pt's R side does seem to be weaker than the left, although difficult to formally assess due to inconsistencies in participation and command following. Pt will benefit from continued acute PT POC to reduce falls risk and caregiver burden. PT currently recommends SNF placement at this time due to poor participation, however if the pt becomes more alert and is better able to participate then pt may be more appropriate for CIR.    Follow Up Recommendations SNF    Equipment Recommendations  Wheelchair (measurements PT);Wheelchair cushion (measurements PT);Hospital bed (mechanical lift, all if home today)    Recommendations for Other Services       Precautions / Restrictions Precautions Precautions: Fall Precaution Comments: h/o falls.  pt with mutiple lines and drain from head. Restrictions Weight Bearing Restrictions: No      Mobility  Bed Mobility Overal bed mobility: Needs Assistance Bed Mobility: Supine to Sit;Sit to Supine   Sidelying to sit: Total assist;+2 for physical assistance Supine to sit: Total assist;+2 for physical assistance Sit to supine: Total assist;+2 for  physical assistance   General bed mobility comments: Pt not assisting  Transfers Overall transfer level: Needs assistance Equipment used: 2 person hand held assist Transfers: Sit to/from Stand Sit to Stand: Total assist;+2 physical assistance         General transfer comment: pt did initiate a stand but total assist thereafter  Ambulation/Gait                Stairs            Wheelchair Mobility    Modified Rankin (Stroke Patients Only) Modified Rankin (Stroke Patients Only) Pre-Morbid Rankin Score: No symptoms Modified Rankin: Severe disability     Balance Overall balance assessment: Needs assistance Sitting-balance support: Feet supported Sitting balance-Leahy Scale: Zero Sitting balance - Comments: min-totalA at edge of bed with BUE support Postural control: Posterior lean Standing balance support: Bilateral upper extremity supported Standing balance-Leahy Scale: Zero Standing balance comment: very little assist from pt                             Pertinent Vitals/Pain Pain Assessment: Faces Faces Pain Scale: Hurts a little bit Pain Location: movement of certain limbs. not consistent Pain Descriptors / Indicators: Discomfort;Guarding;Grimacing Pain Intervention(s): Limited activity within patient's tolerance;Monitored during session;Repositioned    Home Living Family/patient expects to be discharged to:: Private residence Living Arrangements: Alone;Children Available Help at Discharge: Family;Available 24 hours/day Type of Home: House Home Access: Stairs to enter Entrance Stairs-Rails: None Entrance Stairs-Number of Steps: 1 Home Layout: One level Home Equipment: Walker - 4 wheels;Cane - single point Additional Comments: sister in law plans to stay to assist patient  as needed    Prior Function Level of Independence: Independent with assistive device(s)         Comments: driving, caring for 33 y/o grandson; reports using cane  PTA      Hand Dominance        Extremity/Trunk Assessment   Upper Extremity Assessment Upper Extremity Assessment: RUE deficits/detail;LUE deficits/detail RUE Deficits / Details: Spontaneous movement noted. Pt squeezed to command x2.  Strength:  at least...shoulder 1/5, biceps/triceps 2/5, grip 2/5. RUE Sensation:  (unable to assess) RUE Coordination: decreased fine motor;decreased gross motor LUE Deficits / Details: moved spontaeously better than the right.  At least:  shoulder 2/5, biceps/triceps 3+/5, grip 3/5.  LUE Sensation:  (unable) LUE Coordination: decreased fine motor;decreased gross motor    Lower Extremity Assessment Lower Extremity Assessment: Defer to PT evaluation       Communication   Communication: Other (comment) (Pt not talking during eval. Very lethargic)  Cognition Arousal/Alertness: Lethargic Behavior During Therapy:  (lethargic) Overall Cognitive Status: Impaired/Different from baseline Area of Impairment: Orientation;Attention;Following commands;Memory;Safety/judgement;Awareness;Problem solving                 Orientation Level:  (not answering any orientation questions) Current Attention Level: Focused Memory: Decreased recall of precautions;Decreased short-term memory Following Commands: Follows one step commands inconsistently Safety/Judgement: Decreased awareness of safety;Decreased awareness of deficits Awareness: Intellectual Problem Solving: Slow processing;Decreased initiation;Difficulty sequencing;Requires verbal cues;Requires tactile cues General Comments: Pt very lethargic today.  Following some one step commands but not all.  Not answering any questions.        General Comments General comments (skin integrity, edema, etc.): Pt very lethargic with very little participation.    Exercises     Assessment/Plan    PT Assessment Patient needs continued PT services  PT Problem List Decreased strength;Decreased activity  tolerance;Decreased balance;Decreased mobility;Decreased cognition;Decreased knowledge of use of DME;Decreased safety awareness;Decreased knowledge of precautions       PT Treatment Interventions DME instruction;Gait training;Stair training;Functional mobility training;Therapeutic activities;Therapeutic exercise;Balance training;Neuromuscular re-education;Cognitive remediation;Patient/family education    PT Goals (Current goals can be found in the Care Plan section)  Acute Rehab PT Goals Patient Stated Goal: none stated PT Goal Formulation: With patient Time For Goal Achievement: 08/09/20 Potential to Achieve Goals: Fair    Frequency Min 3X/week   Barriers to discharge        Co-evaluation PT/OT/SLP Co-Evaluation/Treatment: Yes Reason for Co-Treatment: Complexity of the patient's impairments (multi-system involvement);Necessary to address cognition/behavior during functional activity PT goals addressed during session: Mobility/safety with mobility OT goals addressed during session: ADL's and self-care       AM-PAC PT "6 Clicks" Mobility  Outcome Measure Help needed turning from your back to your side while in a flat bed without using bedrails?: Total Help needed moving from lying on your back to sitting on the side of a flat bed without using bedrails?: Total Help needed moving to and from a bed to a chair (including a wheelchair)?: Total Help needed standing up from a chair using your arms (e.g., wheelchair or bedside chair)?: Total Help needed to walk in hospital room?: Total Help needed climbing 3-5 steps with a railing? : Total 6 Click Score: 6    End of Session   Activity Tolerance: Patient limited by lethargy Patient left: in bed;with call bell/phone within reach;with bed alarm set Nurse Communication: Mobility status;Need for lift equipment PT Visit Diagnosis: Other abnormalities of gait and mobility (R26.89);Muscle weakness (generalized) (M62.81);Difficulty in  walking, not elsewhere classified (R26.2);Other symptoms  and signs involving the nervous system (R29.898)    Time: 7829-5621 PT Time Calculation (min) (ACUTE ONLY): 25 min   Charges:   PT Evaluation $PT Eval Moderate Complexity: 1 Mod          Arlyss Gandy, PT, DPT Acute Rehabilitation Pager: [phone]   Arlyss Gandy 07/26/2020, 10:24 AM

## 2020-07-26 NOTE — Telephone Encounter (Signed)
   DDIs reviewed QTc 466

## 2020-07-26 NOTE — Consult Note (Signed)
Reason for Consult:Scalp trauma Referring Physician: Dr. Coletta Memos  Erin Donaldson is an 69 y.o. female.  HPI: A 69 year old white female here for treatment afterThe patient is an accident.  She presented to the emergency room with progressive weakness.  She has had multiple falls with repeat visits to the emergency room.  She had a skull fracture as well and has been admitted to Dr. Franky Macho.  She is being followed with CT scans.  Dr. Rema Jasmine performed a shunt placement after she was found to have a subdural hematoma.  She is quite few confused today.  She is aware of herself but not much of her surroundings.  She had a large area of missing tissue of her scalp.  Dr. Franky Macho did an excellent closure in the OR.  Nothing appears infected at this time.   ED labs also notable  Past Medical History:  Diagnosis Date  . Anemia   . Anxiety   . Bipolar 1 disorder (HCC)   . Bipolar affective (HCC)   . Depression   . Diabetes in pregnancy   . Diabetes mellitus   . Diabetes mellitus without complication (HCC)   . Elevated LFTs   . GERD (gastroesophageal reflux disease)   . Hyperlipidemia   . Hypertension   . IBS (irritable bowel syndrome)   . Migraine     Past Surgical History:  Procedure Laterality Date  . BURR HOLE [name] 07/24/2020   Procedure: Ines Bloomer hole for evacuation of chronic subdural with scalp laceration repair;  Surgeon: Coletta Memos, MD;  Location: [facility] OR;  Service: Neurosurgery;  Laterality: [name];  Burr hole for evacuation of chronic subdural with scalp laceration repair  . CHOLECYSTECTOMY    . GASTRIC BYPASS  2005  . SCALP LACERATION REPAIR [name] 07/24/2020   Procedure: SCALP LACERATION REPAIR;  Surgeon: Coletta Memos, MD;  Location: [facility] OR;  Service: Neurosurgery;  Laterality: [name];  SCALP LACERATION REPAIR    Family History  Problem Relation Age of Onset  . Kidney disease Father   . Heart disease Father 54       CABG  . Diabetes Father   . CAD Father   . Heart disease Mother 24         CAD  . Breast cancer Mother   . Cancer Mother        bladder  . Heart disease Maternal Grandfather   . Heart attack Maternal Grandfather   . Stroke Maternal Grandfather   . Heart disease Brother   . Dementia Brother   . Heart disease Maternal Aunt   . Heart disease Maternal Uncle   . Heart disease Paternal Aunt   . Heart disease Paternal Uncle   . Diabetes Maternal Aunt   . Diabetes Maternal Uncle   . Diabetes Paternal Aunt   . Diabetes Paternal Uncle   . Stroke Maternal Grandmother   . Heart disease Paternal Grandfather   . Diabetes Daughter   . Drug abuse Daughter   . Depression Sister   . Hypertension Sister   . Alcohol abuse Brother   . Drug abuse Brother   . Other Brother        accident caused brain swelling   . Colon cancer Neg Hx     Social History:  reports that she has never smoked. She has never used smokeless tobacco. She reports current drug use. Drug: Mescaline. She reports that she does not drink alcohol.  Allergies:  Allergies  Allergen Reactions  . Bactrim [Sulfamethoxazole-Trimethoprim]  Raw and irritatated in mouth.  Leodis Liverpool [Propoxyphene N-Acetaminophen]   . Decongestant [Oxymetazoline]   . Elavil [Amitriptyline]   . Lithium Other (See Comments)    Hx toxicity  . Risperidone And Related   . Ultram [Tramadol]   . Cephalosporins Rash and Other (See Comments)    Keflex- Higher dose cause a rash  . Keflex [Cephalexin] Rash  . Omnicef [Cefdinir] Rash    Medications: I have reviewed the patient's current medications.  Results for orders placed or performed during the hospital encounter of 07/23/20 (from the past 48 hour(s))  CBG monitoring, ED     Status: None   Collection Time: 07/24/20 11:33 AM  Result Value Ref Range   Glucose-Capillary 84 70 - 99 mg/dL    Comment: Glucose reference range applies only to samples taken after fasting for at least 8 hours.  Glucose, capillary     Status: None   Collection Time: 07/24/20  1:37 PM   Result Value Ref Range   Glucose-Capillary 75 70 - 99 mg/dL    Comment: Glucose reference range applies only to samples taken after fasting for at least 8 hours.   Comment 1 Notify RN   Glucose, capillary     Status: None   Collection Time: 07/24/20  6:11 PM  Result Value Ref Range   Glucose-Capillary 72 70 - 99 mg/dL    Comment: Glucose reference range applies only to samples taken after fasting for at least 8 hours.   Comment 1 Notify RN   Glucose, capillary     Status: Abnormal   Collection Time: 07/24/20  6:58 PM  Result Value Ref Range   Glucose-Capillary 103 (H) 70 - 99 mg/dL    Comment: Glucose reference range applies only to samples taken after fasting for at least 8 hours.  Glucose, capillary     Status: Abnormal   Collection Time: 07/24/20  8:23 PM  Result Value Ref Range   Glucose-Capillary 112 (H) 70 - 99 mg/dL    Comment: Glucose reference range applies only to samples taken after fasting for at least 8 hours.  Glucose, capillary     Status: Abnormal   Collection Time: 07/24/20  9:51 PM  Result Value Ref Range   Glucose-Capillary 135 (H) 70 - 99 mg/dL    Comment: Glucose reference range applies only to samples taken after fasting for at least 8 hours.  CBC     Status: Abnormal   Collection Time: 07/25/20 12:33 AM  Result Value Ref Range   WBC 8.2 4.0 - 10.5 K/uL   RBC 3.51 (L) 3.87 - 5.11 MIL/uL   Hemoglobin 9.9 (L) 12.0 - 15.0 g/dL   HCT 09.8 (L) 36 - 46 %   MCV 92.6 80.0 - 100.0 fL   MCH 28.2 26.0 - 34.0 pg   MCHC 30.5 30.0 - 36.0 g/dL   RDW 11.9 14.7 - 82.9 %   Platelets 369 150 - 400 K/uL   nRBC 0.0 0.0 - 0.2 %    Comment: Performed at [facility], [address] [place], [place] [address]  Creatinine, serum     Status: None   Collection Time: 07/25/20 12:33 AM  Result Value Ref Range   Creatinine, Ser 0.78 0.44 - 1.00 mg/dL   GFR calc non Af Amer >60 >60 mL/min   GFR calc Af Amer >60 >60 mL/min    Comment: Performed at [facility],  [address] [place], [place] [address]  Glucose,  capillary     Status: None   Collection Time: 07/25/20  7:47 AM  Result Value Ref Range   Glucose-Capillary 72 70 - 99 mg/dL    Comment: Glucose reference range applies only to samples taken after fasting for at least 8 hours.  Glucose, capillary     Status: None   Collection Time: 07/25/20 11:52 AM  Result Value Ref Range   Glucose-Capillary 72 70 - 99 mg/dL    Comment: Glucose reference range applies only to samples taken after fasting for at least 8 hours.  Glucose, capillary     Status: None   Collection Time: 07/25/20  3:51 PM  Result Value Ref Range   Glucose-Capillary 71 70 - 99 mg/dL    Comment: Glucose reference range applies only to samples taken after fasting for at least 8 hours.  Glucose, capillary     Status: None   Collection Time: 07/25/20  9:05 PM  Result Value Ref Range   Glucose-Capillary 86 70 - 99 mg/dL    Comment: Glucose reference range applies only to samples taken after fasting for at least 8 hours.  Glucose, capillary     Status: Abnormal   Collection Time: 07/26/20  3:35 AM  Result Value Ref Range   Glucose-Capillary 68 (L) 70 - 99 mg/dL    Comment: Glucose reference range applies only to samples taken after fasting for at least 8 hours.  Glucose, capillary     Status: None   Collection Time: 07/26/20  3:58 AM  Result Value Ref Range   Glucose-Capillary 98 70 - 99 mg/dL    Comment: Glucose reference range applies only to samples taken after fasting for at least 8 hours.  Glucose, capillary     Status: None   Collection Time: 07/26/20  7:35 AM  Result Value Ref Range   Glucose-Capillary 82 70 - 99 mg/dL    Comment: Glucose reference range applies only to samples taken after fasting for at least 8 hours.    No results found.  Review of Systems  Unable to perform ROS: Acuity of condition  Constitutional: Positive for activity change.  Respiratory: Negative for shortness of breath.    Blood pressure  (!) 144/76, pulse 97, temperature 98.1 F (36.7 C), temperature source Axillary, resp. rate 17, height 5\' 4"  (1.626 m), weight 73.3 kg, SpO2 94 %. Physical Exam Vitals and nursing note reviewed.  HENT:     Head:   Cardiovascular:     Rate and Rhythm: Normal rate.     Pulses: Normal pulses.  Pulmonary:     Effort: Pulmonary effort is normal.  Abdominal:     General: Abdomen is flat.     Assessment/Plan: Scalp wound- recommend offloading on the wound of the scalp as much as possible.  Vaseline and dry gauze to be changed daily.  Follow-up with me in 2 weeks.  Takari Lundahl 07/26/2020, 8:27 AM

## 2020-07-26 NOTE — Progress Notes (Signed)
Patient ID: Erin Donaldson, female   DOB: 04-02-1951, 69 y.o.   MRN: [id number] BP (!) 135/104   Pulse (!) 103   Temp 98.5 F (36.9 C) (Axillary)   Resp (!) 22   Ht 5\' 4"  (1.626 m)   Wt 73.3 kg   SpO2 97%   BMI 27.74 kg/m  Lethargic, interactive with staff, pt and nursing perrl Has pulled out her subdural drain, no real change in exam. Will check CT tomorrow. Able to follow commands

## 2020-07-27 ENCOUNTER — Inpatient Hospital Stay (HOSPITAL_COMMUNITY): Payer: Medicare Other

## 2020-07-27 LAB — GLUCOSE, CAPILLARY
Glucose-Capillary: 89 mg/dL (ref 70–99)
Glucose-Capillary: 83 mg/dL (ref 70–99)
Glucose-Capillary: 84 mg/dL (ref 70–99)
Glucose-Capillary: 81 mg/dL (ref 70–99)

## 2020-07-27 MED ORDER — NITROFURANTOIN MONOHYD MACRO 100 MG PO CAPS
100.0000 mg | ORAL_CAPSULE | Freq: Two times a day (BID) | ORAL | Status: DC
  Filled 2020-07-27 (×2): qty 1

## 2020-07-27 MED ORDER — SODIUM CHLORIDE 0.9 % IV SOLN
1.0000 g | INTRAVENOUS | Status: AC
  Administered 2020-07-27 – 2020-07-29 (×3): 1 g via INTRAVENOUS
  Filled 2020-07-27: qty 1
  Filled 2020-07-27: qty 10
  Filled 2020-07-27 (×2): qty 1

## 2020-07-27 NOTE — Progress Notes (Signed)
   Providing Compassionate, Quality Care - Together  NEUROSURGERY PROGRESS NOTE   S: No issues overnight. Pulled out drain, stable exam  O: EXAM:  BP (!) 134/102   Pulse (!) 110   Temp 98.6 F (37 C) (Axillary)   Resp (!) 34   Ht 5\' 4"  (1.626 m)   Wt 73.3 kg   SpO2 100%   BMI 27.74 kg/m   Awake, alert, noncommunicative tracking Face symmetric 5/5 BUE/BLE  Incision c/d/i PERRL   ASSESSMENT:  69 y.o. female with  L SDH  S/p burr hole drainage  PLAN: - stepdown to NP unit - pain control -pt/ot - ct this am improved, planned rehab eval    Thank you for allowing me to participate in this patient's care.  Please do not hesitate to call with questions or concerns.   73, DO Neurosurgeon [facility] Cell: [phone]

## 2020-07-27 NOTE — Progress Notes (Signed)
Patient refusing to take po medications and food orally.Spits out. Asked via Dr. Dawley>>RN>>pharmacist to find UTI treatment in IV form. Cultures resistant to previous Unasyn and also Cipro and Sulfa. Noted "rash" allergy to several oral cephalosporins. Will try low-dose Rocephin x 3 days and RN will monitor closely for rash.  Trinka Keshishyan S. Merilynn Finland, PharmD, BCPS Clinical Staff Pharmacist [email or web address]

## 2020-07-28 LAB — GLUCOSE, CAPILLARY: Glucose-Capillary: 81 mg/dL (ref 70–99)

## 2020-07-28 NOTE — Progress Notes (Signed)
Notified sister, Burnard Hawthorne, that patient had been transferred to 4NP08.

## 2020-07-28 NOTE — Progress Notes (Signed)
   Providing Compassionate, Quality Care - Together  NEUROSURGERY PROGRESS NOTE   S: No issues overnight. Doing well   O: EXAM:  BP (!) 155/80 (BP Location: Left Arm)   Pulse 99   Temp 97.9 F (36.6 C) (Oral)   Resp 18   Ht 5\' 4"  (1.626 m)   Wt 73.3 kg   SpO2 94%   BMI 27.74 kg/m   Awake, alert, oriented x1 tracking Face symmetric 5/5 BUE/BLE  Incision c/d/i PERRL   ASSESSMENT:  69 y.o. female with  L SDH UTI  S/p burr hole drainage  PLAN: - pain control - pt/ot - ct postop improved, planned rehab eval -abx for UTI    Thank you for allowing me to participate in this patient's care.  Please do not hesitate to call with questions or concerns.   71, DO Neurosurgeon [facility] Cell: [phone]

## 2020-07-28 NOTE — Progress Notes (Signed)
At the beginning of the shift the patient gave name correctly but answered all other questions "yes". At this time she is answering name, place, and date correctly. Pt was not aware of why she was brought to the hospital. PT is very tearful and requesting to see sister. Pt has been reassured to the best of this nurse ability but pt is still very tearful. Pt in bed in lowest position with call bell within reach. Bed alarm is on. Mayford Knife RN

## 2020-07-29 ENCOUNTER — Ambulatory Visit: Payer: Medicare Other | Admitting: Nurse Practitioner

## 2020-07-29 NOTE — Plan of Care (Signed)
  Problem: Education: Goal: Knowledge of General Education information will improve Description: Including pain rating scale, medication(s)/side effects and non-pharmacologic comfort measures Outcome: Progressing   Problem: Clinical Measurements: Goal: Respiratory complications will improve Outcome: Progressing Goal: Cardiovascular complication will be avoided Outcome: Progressing   Pt sat in recliner for 4 hours today, daughter at bedside. VSS. Pt aox4, unsteady on feet. Walked to bathroom with RN and walker, mod assist, urinate and BM today. PT walked pt in hall, pt asked about CIR-RN explained and answered questions. Pt c/o moderate headache today, relieved by 1 norco PRN. Will continue to monitor.

## 2020-07-29 NOTE — Progress Notes (Signed)
Patient ID: Erin Donaldson, female   DOB: Jan 25, 1951, 69 y.o.   MRN: [id number] BP 115/74 (BP Location: Left Arm)   Pulse (!) 102   Temp 97.7 F (36.5 C) (Oral)   Resp 18   Ht 5\' 4"  (1.626 m)   Wt 73.3 kg   SpO2 98%   BMI 27.74 kg/m  Alert and oriented x4, moving all extremities well Wounds are clean and dry Doing well

## 2020-07-29 NOTE — Progress Notes (Signed)
Physical Therapy Treatment Patient Details Name: Erin Donaldson MRN: [id number] DOB: 1951/02/02 Today's Date: 07/29/2020    History of Present Illness Pt is a 69 y/o female with PMH of HTN, bipolar disorder, DM, IBS presenting after a fall (lost her balance and fell backwards hitting her head). REports neck and R shoulder pain, xrays negative. Head CT reveals bilateral acute subdural hematomas, Larger on the L with very little midline shift and non displaced occipital skull fx. Pt readmitted 9/8 for burhole evacuation of chronic subdural on the Left.     PT Comments    Patient much improved from initial evaluation.  She is oriented, but still with some limited insight into deficits and still high fall risk.  States has family able to assist at home (daughter is CNA), and lots of equipment, but feel she would benefit from CIR stay to lessen her fall risk and increase independence prior to home with family support.  PT to continue to follow.    Follow Up Recommendations  CIR     Equipment Recommendations  3in1 (PT)    Recommendations for Other Services Rehab consult     Precautions / Restrictions Precautions Precautions: Fall Precaution Comments: high risk for falls    Mobility  Bed Mobility Overal bed mobility: Needs Assistance Bed Mobility: Sit to Supine     Supine to sit: Min assist     General bed mobility comments: some assist for R LE into bed and cues for positioning  Transfers Overall transfer level: Needs assistance Equipment used: Rolling walker (2 wheeled) Transfers: Sit to/from Stand Sit to Stand: Min assist         General transfer comment: pulls up on RW and keeps holding while going to sit despite instructional cues for safety  Ambulation/Gait Ambulation/Gait assistance: Min assist;+2 safety/equipment;Mod assist Gait Distance (Feet): 100 Feet (x 2) Assistive device: Rolling walker (2 wheeled) Gait Pattern/deviations: Step-to pattern;Decreased stride  length;Decreased step length - right     General Gait Details: chair follow for safety; assist on R for walker management due to R UE weakness, step to pattern with decreased R step length due to weakness   Stairs             Wheelchair Mobility    Modified Rankin (Stroke Patients Only)       Balance Overall balance assessment: Needs assistance Sitting-balance support: Feet supported Sitting balance-Leahy Scale: Poor Sitting balance - Comments: leaning back and to R sitting EOB   Standing balance support: Bilateral upper extremity supported Standing balance-Leahy Scale: Poor Standing balance comment: UE support for balance and at least minguard A                            Cognition Arousal/Alertness: Awake/alert Behavior During Therapy: WFL for tasks assessed/performed Overall Cognitive Status: Impaired/Different from baseline Area of Impairment: Attention;Safety/judgement                   Current Attention Level: Selective Memory: Decreased recall of precautions   Safety/Judgement: Decreased awareness of deficits     General Comments: RN reports improved and oriented, still with verbal acknowledgement of deficits, but not planning well for safety; also with some R inattention      Exercises      General Comments General comments (skin integrity, edema, etc.): eager for home relating all equipment and help she has, educated in issues even with sitting balance and need for short term  intensive rehab prior to home.  She reports will think about it.      Pertinent Vitals/Pain Pain Assessment: No/denies pain    Home Living                      Prior Function            PT Goals (current goals can now be found in the care plan section) Progress towards PT goals: Progressing toward goals    Frequency    Min 4X/week      PT Plan Discharge plan needs to be updated;Frequency needs to be updated    Co-evaluation               AM-PAC PT "6 Clicks" Mobility   Outcome Measure  Help needed turning from your back to your side while in a flat bed without using bedrails?: A Little Help needed moving from lying on your back to sitting on the side of a flat bed without using bedrails?: A Little Help needed moving to and from a bed to a chair (including a wheelchair)?: A Little Help needed standing up from a chair using your arms (e.g., wheelchair or bedside chair)?: A Little Help needed to walk in hospital room?: A Lot Help needed climbing 3-5 steps with a railing? : A Lot 6 Click Score: 16    End of Session Equipment Utilized During Treatment: Gait belt Activity Tolerance: Patient tolerated treatment well;Patient limited by fatigue Patient left: in bed;with call bell/phone within reach   PT Visit Diagnosis: Other abnormalities of gait and mobility (R26.89);Muscle weakness (generalized) (M62.81);Difficulty in walking, not elsewhere classified (R26.2);Other symptoms and signs involving the nervous system (R29.898)     Time: 7510-2585 PT Time Calculation (min) (ACUTE ONLY): 26 min  Charges:  $Gait Training: 23-37 mins                     Sheran Lawless, PT Acute Rehabilitation Services Pager:[phone] Office:[phone] 07/29/2020    Erin Donaldson 07/29/2020, 3:09 PM

## 2020-07-29 NOTE — Progress Notes (Signed)
At shift report, pt is not in restraints and is currently alert and oriented. Pt is following safety measures.  Discontinuing order for restraints.

## 2020-07-30 ENCOUNTER — Encounter (HOSPITAL_COMMUNITY): Payer: Self-pay | Admitting: Neurosurgery

## 2020-07-30 DIAGNOSIS — I6203 Nontraumatic chronic subdural hemorrhage: Secondary | ICD-10-CM

## 2020-07-30 DIAGNOSIS — S065X9A Traumatic subdural hemorrhage with loss of consciousness of unspecified duration, initial encounter: Principal | ICD-10-CM

## 2020-07-30 LAB — GLUCOSE, CAPILLARY
Glucose-Capillary: 115 mg/dL — ABNORMAL HIGH (ref 70–99)
Glucose-Capillary: 98 mg/dL (ref 70–99)

## 2020-07-30 MED ORDER — LEVETIRACETAM 500 MG PO TABS
500.0000 mg | ORAL_TABLET | Freq: Two times a day (BID) | ORAL | Status: DC
  Administered 2020-07-30 – 2020-08-02 (×6): 500 mg via ORAL
  Filled 2020-07-30 (×6): qty 1

## 2020-07-30 MED ORDER — PANTOPRAZOLE SODIUM 40 MG PO TBEC
40.0000 mg | DELAYED_RELEASE_TABLET | Freq: Every day | ORAL | Status: DC
  Administered 2020-07-30 – 2020-08-01 (×3): 40 mg via ORAL
  Filled 2020-07-30 (×3): qty 1

## 2020-07-30 NOTE — Transfer of Care (Addendum)
Immediate Anesthesia Transfer of Care Note  Patient: Erin Donaldson  Procedure(s) Performed: Ines Bloomer hole for evacuation of chronic subdural with scalp laceration repair ([name] Head) SCALP LACERATION REPAIR ([name] Head)  Patient Location: PACU  Anesthesia Type:General  Level of Consciousness: awake  Airway & Oxygen Therapy: Patient Spontanous Breathing and Patient connected to nasal cannula oxygen  Post-op Assessment: Post -op Vital signs reviewed and stable  Post vital signs: Reviewed and stable  Last Vitals:  Vitals Value Taken Time  BP 141/79 07/30/20 1234  Temp 36.6 C 07/30/20 1234  Pulse 94 07/30/20 1355  Resp 12 07/30/20 1355  SpO2 99 % 07/30/20 1355  Vitals shown include unvalidated device data.  Last Pain:  Vitals:   07/30/20 1234  TempSrc: Oral  PainSc:          Complications: No complications documented.

## 2020-07-30 NOTE — Consult Note (Signed)
Physical Medicine and Rehabilitation Consult   Reason for Consult: TBI/SDH Referring Physician: Dr. Franky Macho   HPI: Erin Donaldson is a 69 y.o. female with history of HTN, bipolar disorder, gastric bypass (with 130 lbs wt loss/not diabetic), RLS, migraines, multiple falls with recent diagnosis of B-SDH and right occipital skull fracture 08/22 who was discharged to home is neurologically stable.  She presented to the ED 07/23/2020 with increased weakness, multiple falls, headaches as well as head trauma.  Follow-up CT head revealed that right SDH head largely resolved and generalized edema with acute on chronic SDH noted on the left with increased midline shift as well as moderate to large posterior parietal scalp laceration with hematoma.  She was taken to the OR for bur hole evacuation of chronic subdural hemorrhage as well as repair of scalp laceration by Dr. Franky Macho on 09/08.  Dr. Hester Mates consulted for input due to large area of missing tissue on scalp and felt that no intervention needed as no signs of infection noted.   Postop has continued to have issues with confusion and pulled out a subdural drain.  Follow-up CT head showed interval decrease in left SDH with mild mass-effect and no hydrocephalus. She was found to have multidrug-resistant E. coli UTI which was treated with Rocephin x3 days.  Mentation has been gradually improving but she continues to have decreased insight into deficits as well as weakness affecting ADLs and mobility.  CIR recommended due to functional decline.   Review of Systems  Constitutional: Negative for chills and fever.  HENT: Negative for hearing loss and tinnitus.   Eyes: Negative for blurred vision and double vision.  Respiratory: Negative for cough.   Cardiovascular: Negative for chest pain and palpitations.  Gastrointestinal: Negative for heartburn and nausea.  Genitourinary: Negative for dysuria and urgency.  Musculoskeletal: Negative for joint  pain and myalgias.  Skin: Negative for rash.  Neurological: Positive for headaches. Negative for dizziness, sensory change and speech change.  Psychiatric/Behavioral: The patient does not have insomnia.      Past Medical History:  Diagnosis Date  . Anemia   . Anxiety   . Bipolar 1 disorder (HCC)   . Bipolar affective (HCC)   . Depression   . Diabetes in pregnancy   . Diabetes mellitus   . Diabetes mellitus without complication (HCC)   . Elevated LFTs   . GERD (gastroesophageal reflux disease)   . Hyperlipidemia   . Hypertension   . IBS (irritable bowel syndrome)   . Migraine     Past Surgical History:  Procedure Laterality Date  . BURR HOLE [name] 07/24/2020   Procedure: Ines Bloomer hole for evacuation of chronic subdural with scalp laceration repair;  Surgeon: Coletta Memos, MD;  Location: [facility] OR;  Service: Neurosurgery;  Laterality: [name];  Burr hole for evacuation of chronic subdural with scalp laceration repair  . CHOLECYSTECTOMY    . GASTRIC BYPASS  2005  . SCALP LACERATION REPAIR [name] 07/24/2020   Procedure: SCALP LACERATION REPAIR;  Surgeon: Coletta Memos, MD;  Location: [facility] OR;  Service: Neurosurgery;  Laterality: [name];  SCALP LACERATION REPAIR    Family History  Problem Relation Age of Onset  . Kidney disease Father   . Heart disease Father 76       CABG  . Diabetes Father   . CAD Father   . Heart disease Mother 54       CAD  . Breast cancer Mother   . Cancer Mother  bladder  . Heart disease Maternal Grandfather   . Heart attack Maternal Grandfather   . Stroke Maternal Grandfather   . Heart disease Brother   . Dementia Brother   . Heart disease Maternal Aunt   . Heart disease Maternal Uncle   . Heart disease Paternal Aunt   . Heart disease Paternal Uncle   . Diabetes Maternal Aunt   . Diabetes Maternal Uncle   . Diabetes Paternal Aunt   . Diabetes Paternal Uncle   . Stroke Maternal Grandmother   . Heart disease Paternal Grandfather   . Diabetes Daughter   .  Drug abuse Daughter   . Depression Sister   . Hypertension Sister   . Alcohol abuse Brother   . Drug abuse Brother   . Other Brother        accident caused brain swelling   . Colon cancer Neg Hx     Social History:  Lives with 49 year old adopted grandson. Retired from [employer].  Daughter who's CNA plans to assist after discharge. She reports that she has never smoked. She has never used smokeless tobacco. She reports current drug use. Drug: Mescaline. She reports that she does not drink alcohol.    Allergies  Allergen Reactions  . Bactrim [Sulfamethoxazole-Trimethoprim]     Raw and irritatated in mouth.  Leodis [place] [Propoxyphene N-Acetaminophen]   . Decongestant [Oxymetazoline]   . Elavil [Amitriptyline]   . Lithium Other (See Comments)    Hx toxicity  . Risperidone And Related   . Ultram [Tramadol]   . Cephalosporins Rash and Other (See Comments)    Keflex- Higher dose cause a rash  . Keflex [Cephalexin] Rash  . Omnicef [Cefdinir] Rash    Medications Prior to Admission  Medication Sig Dispense Refill  . albuterol (VENTOLIN HFA) 108 (90 Base) MCG/ACT inhaler Inhale 2 puffs into the lungs every 6 (six) hours as needed for wheezing or shortness of breath. 8 g 0  . ascorbic acid (VITAMIN C) 500 MG tablet Take 500-1,000 mg by mouth daily.    . cyclobenzaprine (FLEXERIL) 10 MG tablet Take 0.5-1 tablets (5-10 mg total) by mouth 2 (two) times daily as needed for muscle spasms. 60 tablet 2  . donepezil (ARICEPT) 10 MG tablet TAKE 1 TABLET BY MOUTH EVERY DAY 90 tablet 0  . DULoxetine (CYMBALTA) 60 MG capsule TAKE 1 CAPSULE BY MOUTH EVERY DAY 90 capsule 1  . fluticasone (FLONASE) 50 MCG/ACT nasal spray SPRAY 2 SPRAYS INTO EACH NOSTRIL EVERY DAY 16 g 5  . furosemide (LASIX) 20 MG tablet Take 10 mg by mouth every other day.     . gabapentin (NEURONTIN) 300 MG capsule TAKE 3 CAPSULES BY MOUTH THREE TIMES A DAY (Patient taking differently: Take 900 mg by mouth 3 (three) times daily. Pt  states she take 3 capsules at bedtime and one in the morning.) 810 capsule 1  . ibuprofen (ADVIL) 200 MG tablet Take 800 mg by mouth every 6 (six) hours as needed for headache or mild pain.    Marland Kitchen lamoTRIgine (LAMICTAL) 200 MG tablet TAKE 1 TABLET BY MOUTH EVERY DAY 90 tablet 1  . meclizine (ANTIVERT) 25 MG tablet Take 1 tablet (25 mg total) by mouth 3 (three) times daily as needed for dizziness. 30 tablet 0  . Multiple Vitamin (MULTIVITAMIN) LIQD Take 15 mLs by mouth daily. 473 mL 1  . nystatin cream (MYCOSTATIN) Apply topically 2 (two) times daily. 60 g 3  . pantoprazole (PROTONIX) 40 MG tablet  TAKE 2 TABLETS BY MOUTH EVERY DAY (Patient taking differently: Take 40 mg by mouth daily. ) 180 tablet 0  . QUEtiapine (SEROQUEL) 200 MG tablet TAKE 1 TABLET BY MOUTH EVERY DAY (Patient taking differently: 200 mg. In the middle of the night) 90 tablet 3  . QUEtiapine (SEROQUEL) 400 MG tablet TAKE 2 TABLETS (800 MG TOTAL) BY MOUTH AT BEDTIME. 180 tablet 2  . rizatriptan (MAXALT) 10 MG tablet Take 1 tablet (10 mg total) by mouth as needed. May repeat in 2 hours if needed 10 tablet 5  . acetaminophen-codeine (TYLENOL #3) 300-30 MG tablet Take 1 tablet by mouth every 6 (six) hours as needed for moderate pain. (Patient not taking: Reported on 07/23/2020) 30 tablet 0  . cetirizine (ZYRTEC) 10 MG tablet Take 1 tablet (10 mg total) by mouth daily. (Patient not taking: Reported on 07/23/2020) 30 tablet 11  . Vitamin D, Ergocalciferol, (DRISDOL) 1.25 MG (50000 UT) CAPS capsule Take 50,000 Units by mouth 2 (two) times a week.  (Patient not taking: Reported on 07/23/2020)  3    Home: Home Living Family/patient expects to be discharged to:: Private residence Living Arrangements: Alone, Children Available Help at Discharge: Family, Available 24 hours/day Type of Home: House Home Access: Stairs to enter Entergy CorporationEntrance Stairs-Number of Steps: 1 Entrance Stairs-Rails: None Home Layout: One level Bathroom Shower/Tub: Teacher, musicTub/shower  unit Bathroom Toilet: Standard Bathroom Accessibility: Yes Home Equipment: Environmental consultantWalker - 4 wheels, Cane - single point Additional Comments: sister in law plans to stay to assist patient as needed  Functional History: Prior Function Level of Independence: Independent with assistive device(s) Comments: driving, caring for 67 y/o grandson; reports using cane PTA  Functional Status:  Mobility: Bed Mobility Overal bed mobility: Needs Assistance Bed Mobility: Sit to Supine Sidelying to sit: Total assist, +2 for physical assistance Supine to sit: Min assist Sit to supine: Total assist, +2 for physical assistance General bed mobility comments: some assist for R LE into bed and cues for positioning Transfers Overall transfer level: Needs assistance Equipment used: Rolling walker (2 wheeled) Transfers: Sit to/from Stand Sit to Stand: Min assist General transfer comment: pulls up on RW and keeps holding while going to sit despite instructional cues for safety Ambulation/Gait Ambulation/Gait assistance: Min assist, +2 safety/equipment, Mod assist Gait Distance (Feet): 100 Feet (x 2) Assistive device: Rolling walker (2 wheeled) Gait Pattern/deviations: Step-to pattern, Decreased stride length, Decreased step length - right General Gait Details: chair follow for safety; assist on R for walker management due to R UE weakness, step to pattern with decreased R step length due to weakness    ADL: ADL Overall ADL's : Needs assistance/impaired Eating/Feeding: Total assistance, Sitting Grooming: Total assistance, Sitting Upper Body Bathing: Total assistance, Sitting Lower Body Bathing: Total assistance, Bed level Upper Body Dressing : Total assistance, Sitting Lower Body Dressing: Total assistance, Bed level Toilet Transfer: Total assistance, +2 for physical assistance Toileting- Clothing Manipulation and Hygiene: Total assistance, Bed level Functional mobility during ADLs: Total assistance, +2 for  physical assistance General ADL Comments: Pt unable to follow commands or compete any adls or assist much with mobility.  Cognition: Cognition Overall Cognitive Status: Impaired/Different from baseline Orientation Level: Oriented X4 Cognition Arousal/Alertness: Awake/alert Behavior During Therapy: WFL for tasks assessed/performed Overall Cognitive Status: Impaired/Different from baseline Area of Impairment: Attention, Safety/judgement Orientation Level:  (not answering any orientation questions) Current Attention Level: Selective Memory: Decreased recall of precautions Following Commands: Follows one step commands inconsistently Safety/Judgement: Decreased awareness of deficits Awareness: Intellectual Problem  Solving: Slow processing, Decreased initiation, Difficulty sequencing, Requires verbal cues, Requires tactile cues General Comments: RN reports improved and oriented, still with verbal acknowledgement of deficits, but not planning well for safety; also with some R inattention Difficult to assess due to: Level of arousal   Blood pressure (!) 108/59, pulse 85, temperature 97.6 F (36.4 C), temperature source Oral, resp. rate 17, height  (1.626 m), weight 73.3 kg, SpO2 97 %. Physical Exam Vitals and nursing note reviewed.  Constitutional:      Appearance: Normal appearance.  HENT:     Head:     Comments: Burr hole with incisional sutures in place.     Right Ear: External ear normal.     Left Ear: External ear normal.     Nose: Nose normal.  Eyes:     Pupils: Pupils are equal, round, and reactive to light.  Cardiovascular:     Rate and Rhythm: Normal rate.  Pulmonary:     Effort: Pulmonary effort is normal.  Abdominal:     General: Abdomen is flat.  Musculoskeletal:     Cervical back: Normal range of motion.  Skin:    Comments: Right forearm ecchymoses   Neurological:     Comments: Alert and oriented to place, person, month,date. Follows basic commands.  Reasonable insight and awareness. Answers biographical questions.  Mild right PD. RUE 4/5 LUE 5/5. RLE 4/5. LLE 4+/5. Senses pain and light touch in all 4's.  Psychiatric:        Mood and Affect: Mood normal.     No results found for this or any previous visit (from the past 24 hour(s)). No results found.   Assessment/Plan: Diagnosis: Bilateral acute/chronic SDH's s/p burr hole evacuation of left SDH 1. Does the need for close, 24 hr/day medical supervision in concert with the patient's rehab needs make it unreasonable for this patient to be served in a less intensive setting? Yes 2. Co-Morbidities requiring supervision/potential complications: HTN, bipolar disorder, headaches, E Coli UTI 3. Due to bladder management, bowel management, safety, skin/wound care, disease management, medication administration, pain management and patient education, does the patient require 24 hr/day rehab nursing? Yes 4. Does the patient require coordinated care of a physician, rehab nurse, therapy disciplines of PT, OT, SLP to address physical and functional deficits in the context of the above medical diagnosis(es)? Yes Addressing deficits in the following areas: balance, endurance, locomotion, strength, transferring, bowel/bladder control, bathing, dressing, feeding, grooming, toileting, cognition and psychosocial support 5. Can the patient actively participate in an intensive therapy program of at least 3 hrs of therapy per day at least 5 days per week? Yes 6. The potential for patient to make measurable gains while on inpatient rehab is excellent 7. Anticipated functional outcomes upon discharge from inpatient rehab are modified independent and supervision  with PT, modified independent and supervision with OT, modified independent with SLP. 8. Estimated rehab length of stay to reach the above functional goals is: 7-10 days 9. Anticipated discharge destination: Home 10. Overall Rehab/Functional Prognosis:  excellent  RECOMMENDATIONS: This patient's condition is appropriate for continued rehabilitative care in the following setting: CIR Patient has agreed to participate in recommended program. Yes Note that insurance prior authorization may be required for reimbursement for recommended care.  Comment: Rehab Admissions Coordinator to follow up.  Thanks,  Ranelle Oyster, MD, [place] [facility]  I have personally performed a face to face diagnostic evaluation of this patient. Additionally, I have examined pertinent labs and radiographic images. I have  reviewed and concur with the physician assistant's documentation above.    Jacquelynn Cree, PA-C 07/30/2020

## 2020-07-30 NOTE — Progress Notes (Signed)
Inpatient Rehab Admissions Coordinator Note:   Per therapy updated recommendations, pt was screened for CIR candidacy by Estill Dooms, PT, DPT.  At this time we are recommending a CIR consult.  Please place an order if pt would like to be considered for CIR.  Please contact me with questions.   Estill Dooms, PT, DPT [phone] 07/30/20 10:39 AM

## 2020-07-30 NOTE — Progress Notes (Signed)
Patient ID: Erin Donaldson, female   DOB: May 04, 1951, 69 y.o.   MRN: [id number] BP 132/72 (BP Location: Left Arm)   Pulse 95   Temp 98.1 F (36.7 C) (Oral)   Resp 17   Ht 5\' 4"  (1.626 m)   Wt 73.3 kg   SpO2 97%   BMI 27.74 kg/m  Alert and oriented x 4, speech is clear and fluent Moving all extremities Scalp wound breaking down in the middle Will treat with ointment and telfa Will keep pressure off

## 2020-07-30 NOTE — Progress Notes (Signed)
Physical Therapy Treatment Patient Details Name: Erin Donaldson MRN: [id number] DOB: 03-Mar-1951 Today's Date: 07/30/2020    History of Present Illness Pt is a 69 y/o female with PMH of HTN, bipolar disorder, DM, IBS presenting after a fall (lost her balance and fell backwards hitting her head). REports neck and R shoulder pain, xrays negative. Head CT reveals bilateral acute subdural hematomas, Larger on the L with very little midline shift and non displaced occipital skull fx. Pt readmitted 9/8 for burhole evacuation of chronic subdural on the Left.     PT Comments    Patient demo selective attention ordering her food while on BSC and initiating hygiene.  She was aware of need to wait for assist to bathroom and actually didn't make it but waited safely for help.  She still is less aware of R sided weakness at times with walker running into obstacles.  She progressed to step through gait as well this session.  She remains excellent candidate for CIR level rehab prior to home.  PT to follow.    Follow Up Recommendations  CIR     Equipment Recommendations  3in1 (PT)    Recommendations for Other Services       Precautions / Restrictions Precautions Precautions: Fall Precaution Comments: high risk for falls    Mobility  Bed Mobility Overal bed mobility: Needs Assistance Bed Mobility: Supine to Sit     Supine to sit: Min assist;HOB elevated     General bed mobility comments: had waited to go to bathroom after calling for assist so ended up soiling herself in the bed, but encouraged to wait for help due to fall risk; up to EOB with min A for balance  Transfers Overall transfer level: Needs assistance Equipment used: Rolling walker (2 wheeled) Transfers: Sit to/from Stand Sit to Stand: Min assist         General transfer comment: assist for balance, cues for safety  Ambulation/Gait Ambulation/Gait assistance: Mod assist;Min assist Gait Distance (Feet): 100 Feet (with  seated rest (100' x 2)) Assistive device: Rolling walker (2 wheeled) Gait Pattern/deviations: Step-through pattern;Decreased stride length;Decreased dorsiflexion - right;Shuffle     General Gait Details: assist for walker around obstacles with R UE weakness, but demo step through technique most of session   Stairs             Wheelchair Mobility    Modified Rankin (Stroke Patients Only)       Balance Overall balance assessment: Needs assistance Sitting-balance support: Feet supported Sitting balance-Leahy Scale: Poor Sitting balance - Comments: leaning back and to R sitting EOB   Standing balance support: Bilateral upper extremity supported Standing balance-Leahy Scale: Poor Standing balance comment: UE support for balance and at least minguard A                            Cognition Arousal/Alertness: Awake/alert Behavior During Therapy: WFL for tasks assessed/performed Overall Cognitive Status: Impaired/Different from baseline Area of Impairment: Attention;Safety/judgement                   Current Attention Level: Selective     Safety/Judgement: Decreased awareness of deficits;Decreased awareness of safety            Exercises      General Comments General comments (skin integrity, edema, etc.): reports wants to go to rehab; assist for hygiene on [facility] and changing gown      Pertinent Vitals/Pain Faces Pain  Scale: Hurts little more Pain Location: head Pain Descriptors / Indicators: Headache Pain Intervention(s): Monitored during session;Patient requesting pain meds-RN notified    Home Living                      Prior Function            PT Goals (current goals can now be found in the care plan section) Progress towards PT goals: Progressing toward goals    Frequency    Min 4X/week      PT Plan Current plan remains appropriate    Co-evaluation              AM-PAC PT "6 Clicks" Mobility   Outcome  Measure  Help needed turning from your back to your side while in a flat bed without using bedrails?: A Little Help needed moving from lying on your back to sitting on the side of a flat bed without using bedrails?: A Little Help needed moving to and from a bed to a chair (including a wheelchair)?: A Little Help needed standing up from a chair using your arms (e.g., wheelchair or bedside chair)?: A Little Help needed to walk in hospital room?: A Lot Help needed climbing 3-5 steps with a railing? : A Lot 6 Click Score: 16    End of Session Equipment Utilized During Treatment: Gait belt Activity Tolerance: Patient tolerated treatment well Patient left: in chair;with call bell/phone within reach   PT Visit Diagnosis: Other abnormalities of gait and mobility (R26.89);Muscle weakness (generalized) (M62.81);Difficulty in walking, not elsewhere classified (R26.2);Other symptoms and signs involving the nervous system (G29.528)     Time: 4132-4401 PT Time Calculation (min) (ACUTE ONLY): 23 min  Charges:  $Gait Training: 8-22 mins $Therapeutic Activity: 8-22 mins                     Sheran Lawless, PT Acute Rehabilitation Services Pager:[phone] [phone] 07/30/2020    Elray Mcgregor 07/30/2020, 5:36 PM

## 2020-07-31 NOTE — PMR Pre-admission (Shared)
PMR Admission Coordinator Pre-Admission Assessment  Patient: Erin Donaldson is an 69 y.o., female MRN: [id number] DOB: 1950/12/25 Height: _0  (162.6 cm) Weight: 73.3 kg              Insurance Information HMO: Yes - POS   PPO:       PCP:       IPA:       80/20:       OTHER:   PRIMARY: [employer] medicare      Policy#: [id number]      Subscriber: patient CM Name: ***      Phone#: ***     Fax#: [phone] Pre-Cert#: [id number]      Employer: Retired Benefits:  Phone #: [phone]     Name: On line [email or web address] Eff. Date: 12/18/2019     Deduct: $0      Out of Pocket Max: $3600 (met $0)      Life Max: none  CIR: $295 days 105      SNF: $0 days 1-20; $184 days 21-40; $0 days 41-100 Outpatient: medical necessity     Co-Pay: $30/visit Home Health: 100%      Co-Pay: none DME: 80%     Co-Pay: 20% Providers: in network  SECONDARY:        Policy#:        Phone#:    Development worker, community:        Phone#:    The Engineer, petroleum" for patients in Inpatient Rehabilitation Facilities with attached "Privacy Act [place] Records" was provided and verbally reviewed with: {CHL IP Patient Family JI:[id number]}  Emergency Contact Information Contact Information    Name Relation Home Work Mobile   [place] Sister [phone]  [phone]     Current Medical History  Patient Admitting Diagnosis: B SDH s/p burr hole evac L SDH  History of Present Illness: a 69 y.o. female with history of HTN, bipolar disorder, gastric bypass (with 130 lbs wt loss/not diabetic), RLS, migraines, multiple falls with recent diagnosis of B-SDH and right occipital skull fracture 08/22 who was discharged to home is neurologically stable.  She presented to the ED 07/23/2020 with increased weakness, multiple falls, headaches as well as head trauma.  Follow-up CT head revealed that right SDH head largely resolved and generalized edema with acute on chronic SDH noted on the left with increased  midline shift as well as moderate to large posterior parietal scalp laceration with hematoma.  She was taken to the OR for bur hole evacuation of chronic subdural hemorrhage as well as repair of scalp laceration by Dr. Christella Noa on 09/08.  Dr. Elisabeth Cara consulted for input due to large area of missing tissue on scalp and felt that no intervention needed as no signs of infection noted.   Postop has continued to have issues with confusion and pulled out a subdural drain.  Follow-up CT head showed interval decrease in left SDH with mild mass-effect and no hydrocephalus. She was found to have multidrug-resistant E. coli UTI which was treated with Rocephin x3 days.  Mentation has been gradually improving but she continues to have decreased insight into deficits as well as weakness affecting ADLs and mobility.  CIR recommended due to functional decline.   Glasgow Coma Scale Score: 15  Past Medical History  Past Medical History:  Diagnosis Date  . Anemia   . Anxiety   . Bipolar 1 disorder ([place])   . Bipolar affective ([place])   . Depression   . Diabetes in pregnancy   .  Diabetes mellitus   . Diabetes mellitus without complication ([place])   . Elevated LFTs   . GERD (gastroesophageal reflux disease)   . Hyperlipidemia   . Hypertension   . IBS (irritable bowel syndrome)   . Migraine     Family History  family history includes Alcohol abuse in her brother; Breast cancer in her mother; CAD in her father; Cancer in her mother; Dementia in her brother; Depression in her sister; Diabetes in her daughter, father, maternal aunt, maternal uncle, paternal aunt, and paternal uncle; Drug abuse in her brother and daughter; Heart attack in her maternal grandfather; Heart disease in her brother, maternal aunt, maternal grandfather, maternal uncle, paternal aunt, paternal grandfather, and paternal uncle; Heart disease (age of onset: 16) in her mother; Heart disease (age of onset: 93) in her father; Hypertension in her  sister; Kidney disease in her father; Other in her brother; Stroke in her maternal grandfather and maternal grandmother.  Prior Rehab/Hospitalizations:  Has the patient had prior rehab or hospitalizations prior to admission? Yes  Has the patient had major surgery during 100 days prior to admission? Yes  Current Medications   Current Facility-Administered Medications:  .  0.9 % NaCl with KCl 20 mEq/ L  infusion, , Intravenous, Continuous, Dawley, Troy C, DO, Last Rate: 80 mL/hr at 07/31/20 1000, Rate Verify at 07/31/20 1000 .  acetaminophen (TYLENOL) tablet 650 mg, 650 mg, Oral, Q4H PRN, 650 mg at 07/27/20 0755 **OR** acetaminophen (TYLENOL) suppository 650 mg, 650 mg, Rectal, Q4H PRN, Dawley, Troy C, DO .  albuterol (PROVENTIL) (2.5 MG/3ML) 0.083% nebulizer solution 2.5 mg, 2.5 mg, Inhalation, Q6H PRN, Dawley, Troy C, DO .  ascorbic acid (VITAMIN C) tablet 500 mg, 500 mg, Oral, Daily, Dawley, Troy C, DO, 500 mg at 07/31/20 0934 .  bisacodyl (DULCOLAX) EC tablet 5 mg, 5 mg, Oral, Daily PRN, Dawley, Troy C, DO .  Chlorhexidine Gluconate Cloth 2 % PADS 6 each, 6 each, Topical, Daily, Dawley, Troy C, DO, 6 each at 07/31/20 0935 .  docusate sodium (COLACE) capsule 100 mg, 100 mg, Oral, BID, Dawley, Troy C, DO, 100 mg at 07/31/20 0934 .  donepezil (ARICEPT) tablet 10 mg, 10 mg, Oral, Daily, Dawley, Troy C, DO, 10 mg at 07/31/20 0934 .  DULoxetine (CYMBALTA) DR capsule 60 mg, 60 mg, Oral, Daily, Dawley, Troy C, DO, 60 mg at 07/31/20 0934 .  fluticasone (FLONASE) 50 MCG/ACT nasal spray 2 spray, 2 spray, Each Nare, Daily, Dawley, Troy C, DO, 2 spray at 07/30/20 0955 .  furosemide (LASIX) tablet 10 mg, 10 mg, Oral, QODAY, Dawley, Troy C, DO, 10 mg at 07/30/20 0953 .  gabapentin (NEURONTIN) capsule 300 mg, 300 mg, Oral, TID, Dawley, Troy C, DO, 300 mg at 07/31/20 0935 .  heparin injection 5,000 Units, 5,000 Units, Subcutaneous, Q8H, Dawley, Troy C, DO, 5,000 Units at 07/31/20 1316 .  hydrALAZINE  (APRESOLINE) injection 5 mg, 5 mg, Intravenous, Q4H PRN, Dawley, Troy C, DO .  HYDROcodone-acetaminophen (NORCO/VICODIN) 5-325 MG per tablet 1 tablet, 1 tablet, Oral, Q4H PRN, Dawley, Troy C, DO, 1 tablet at 07/30/20 1811 .  labetalol (NORMODYNE) injection 10-40 mg, 10-40 mg, Intravenous, Q10 min PRN, Dawley, Troy C, DO .  lamoTRIgine (LAMICTAL) tablet 200 mg, 200 mg, Oral, Daily, Dawley, Troy C, DO, 200 mg at 07/31/20 0934 .  levETIRAcetam (KEPPRA) tablet 500 mg, 500 mg, Oral, BID, Ashok Pall, MD, 500 mg at 07/31/20 0934 .  magnesium citrate solution 1 Bottle, 1 Bottle, Oral, Once PRN,  Dawley, Troy C, DO .  MEDLINE mouth rinse, 15 mL, Mouth Rinse, BID, Dawley, Troy C, DO, 15 mL at 07/30/20 2222 .  multivitamin with minerals tablet 1 tablet, 1 tablet, Oral, Daily, Dawley, Troy C, DO, 1 tablet at 07/31/20 0935 .  naloxone [facility]) injection 0.08 mg, 0.08 mg, Intravenous, PRN, Dawley, Troy C, DO .  nystatin cream (MYCOSTATIN), , Topical, BID, Dawley, Troy C, DO, Given at 07/30/20 0952 .  ondansetron (ZOFRAN) tablet 4 mg, 4 mg, Oral, Q4H PRN **OR** ondansetron (ZOFRAN) injection 4 mg, 4 mg, Intravenous, Q4H PRN, Dawley, Troy C, DO, 4 mg at 07/24/20 2200 .  pantoprazole (PROTONIX) EC tablet 40 mg, 40 mg, Oral, QHS, Ashok Pall, MD, 40 mg at 07/30/20 2218 .  polyethylene glycol (MIRALAX / GLYCOLAX) packet 17 g, 17 g, Oral, Daily PRN, Dawley, Troy C, DO .  QUEtiapine (SEROQUEL) tablet 800 mg, 800 mg, Oral, QHS, Ashok Pall, MD, 800 mg at 07/30/20 2218 .  senna (SENOKOT) tablet 8.6 mg, 1 tablet, Oral, BID, Dawley, Troy C, DO, 8.6 mg at 07/31/20 0935 .  sodium phosphate (FLEET) 7-19 GM/118ML enema 1 enema, 1 enema, Rectal, Once PRN, Dawley, Troy C, DO  Patients Current Diet:  Diet Order            Diet heart healthy/carb modified Room service appropriate? Yes; Fluid consistency: Thin  Diet effective now                 Precautions / Restrictions Precautions Precautions: Fall Precaution  Comments: high risk for falls Restrictions Weight Bearing Restrictions: No   Has the patient had 2 or more falls or a fall with injury in the past year?Yes  Prior Activity Level Community (5-7x/wk): Went out 3-4 times a week, was driving, is retired.  Prior Functional Level Prior Function Level of Independence: Independent with assistive device(s) Comments: driving, caring for 12 y/o grandson; reports using cane PTA   Self Care: Did the patient need help bathing, dressing, using the toilet or eating?  Independent  Indoor Mobility: Did the patient need assistance with walking from room to room (with or without device)? Independent  Stairs: Did the patient need assistance with internal or external stairs (with or without device)? Independent  Functional Cognition: Did the patient need help planning regular tasks such as shopping or remembering to take medications? Independent  Home Assistive Devices / Equipment Home Equipment: Walker - 4 wheels, Cane - single point  Prior Device Use: Indicate devices/aids used by the patient prior to current illness, exacerbation or injury? None  Current Functional Level Cognition  Overall Cognitive Status: Impaired/Different from baseline Difficult to assess due to: Level of arousal Current Attention Level: Selective Orientation Level: Oriented X4 Following Commands: Follows one step commands inconsistently Safety/Judgement: Decreased awareness of deficits, Decreased awareness of safety General Comments: appears to have improved awareness this session; sequencing through oral care task without cues; noted STM deficits with recall/repetition of address (administered Short Blessed Test), pt receiving gross score of 6 on short blessed test (indicative of questionable impairment)    Extremity Assessment (includes Sensation/Coordination)  Upper Extremity Assessment: RUE deficits/detail, LUE deficits/detail RUE Deficits / Details: Spontaneous  movement noted. Pt squeezed to command x2.  Strength:  at least...shoulder 1/5, biceps/triceps 2/5, grip 2/5. RUE Sensation:  (unable to assess) RUE Coordination: decreased fine motor, decreased gross motor LUE Deficits / Details: moved spontaeously better than the right.  At least:  shoulder 2/5, biceps/triceps 3+/5, grip 3/5.  LUE Sensation:  (unable)  LUE Coordination: decreased fine motor, decreased gross motor  Lower Extremity Assessment: Defer to PT evaluation    ADLs  Overall ADL's : Needs assistance/impaired Eating/Feeding: Total assistance, Sitting Grooming: Oral care, Minimal assistance, Standing Grooming Details (indicate cue type and reason): for balance  Upper Body Bathing: Total assistance, Sitting Lower Body Bathing: Total assistance, Bed level Upper Body Dressing : Total assistance, Sitting Lower Body Dressing: Total assistance, Bed level Toilet Transfer: Total assistance, +2 for physical assistance Toileting- Clothing Manipulation and Hygiene: Total assistance, Bed level Functional mobility during ADLs: Minimal assistance, Rolling walker General ADL Comments: Pt unable to follow commands or compete any adls or assist much with mobility.    Mobility  Overal bed mobility: Needs Assistance Bed Mobility: Supine to Sit Sidelying to sit: Total assist, +2 for physical assistance Supine to sit: Min assist, HOB elevated Sit to supine: Total assist, +2 for physical assistance General bed mobility comments: OOB in recliner     Transfers  Overall transfer level: Needs assistance Equipment used: Rolling walker (2 wheeled) Transfers: Sit to/from Stand Sit to Stand: Min assist General transfer comment: assist for balance, cues for safety and hand placement, assist for controlled descent     Ambulation / Gait / Stairs / Wheelchair Mobility  Ambulation/Gait Ambulation/Gait assistance: Mod assist, Min assist Gait Distance (Feet): 100 Feet (with seated rest (100' x 2)) Assistive  device: Rolling walker (2 wheeled) Gait Pattern/deviations: Step-through pattern, Decreased stride length, Decreased dorsiflexion - right, Shuffle General Gait Details: assist for walker around obstacles with R UE weakness, but demo step through technique most of session    Posture / Balance Dynamic Sitting Balance Sitting balance - Comments: leaning back and to R sitting EOB Balance Overall balance assessment: Needs assistance Sitting-balance support: Feet supported Sitting balance-Leahy Scale: Fair Sitting balance - Comments: leaning back and to R sitting EOB Postural control: Posterior lean Standing balance support: Bilateral upper extremity supported, During functional activity Standing balance-Leahy Scale: Poor Standing balance comment: minA for balance during functional tasks     Special needs/care consideration Continuous Drip IV  ***, Skin Has sutures on frontal scalp and dressing over back right side of head and Designated visitor ***     Previous Home Environment (from acute therapy documentation) Living Arrangements: Alone, Children Available Help at Discharge: Family, Available 24 hours/day Type of Home: House Home Layout: One level Home Access: Stairs to enter Entrance Stairs-Rails: None Entrance Stairs-Number of Steps: 1 Bathroom Shower/Tub: Government social research officer Accessibility: Yes Additional Comments: sister in law plans to stay to assist patient as needed  Discharge Living Setting Plans for Discharge Living Setting: Patient's home, House, Lives with (comment) (Lives with daughter who is a CNA) Type of Home at Discharge: House Discharge Home Layout: One level Discharge Home Access: Stairs to enter Entrance Stairs-Number of Steps: 1 small step from back deck entry Discharge Bathroom Shower/Tub: Tub/shower unit, Door Discharge Bathroom Toilet: Standard Discharge Bathroom Accessibility: Yes How Accessible: Accessible via walker   Social/Family/Support Systems Patient Roles: Parent, Other (Comment) (Has a daughter, sister, SIL and friend to assist) Contact Information: Dossie Der - sister - [phone] and work is [phone] Anticipated Caregiver: Sister, Dtr, SIL, friend Anticipated Caregiver's Contact Information: Aquilla Solian - daughter - [phone] and [phone] Ability/Limitations of Caregiver: Daughter not currently working Caregiver Availability: 24/7 Discharge Plan Discussed with Primary Caregiver: Yes Is Caregiver In Agreement with Plan?: Yes Does Caregiver/Family have Issues with Lodging/Transportation while Pt is in Rehab?: No   Goals Patient/Family  Goal for Rehab: PT/OT mod I/S, SLP mod I goals Expected length of stay: 7-10 days Cultural Considerations: None Pt/Family Agrees to Admission and willing to participate: Yes Program Orientation Provided & Reviewed with Pt/Caregiver Including Roles  & Responsibilities: Yes   Decrease burden of Care through IP rehab admission: [name]  Possible need for SNF placement upon discharge: Not anticipated   Patient Condition: {PATIENT'S CONDITION:22832}  Preadmission Screen Completed By:  Retta Diones, RN, 07/31/2020 2:20 PM ______________________________________________________________________   Discussed status with Dr. Marland Kitchenon***at *** and received approval for admission today.  Admission Coordinator:  Retta Diones, time***/Date***

## 2020-07-31 NOTE — Progress Notes (Signed)
IP rehab admissions - I met with patient at the bedside.  I gave her booklets and discussed inpatient rehab admission.  I have started [facility] which could take 48 to 72 hours for [place] to have a decision regarding potential acute inpatient rehab admission.  I will update all when I hear back from insurance case manager.  Call me for questions.  [phone]

## 2020-07-31 NOTE — Progress Notes (Signed)
Occupational Therapy Treatment Patient Details Name: Erin Donaldson MRN: [id number] DOB: [birth year] Today's Date: 07/31/2020    History of present illness Pt is a 69 y/o female with PMH of HTN, bipolar disorder, DM, IBS presenting after a fall (lost her balance and fell backwards hitting her head). REports neck and R shoulder pain, xrays negative. Head CT reveals bilateral acute subdural hematomas, Larger on the L with very little midline shift and non displaced occipital skull fx. Pt readmitted 9/8 for burhole evacuation of chronic subdural on the Left.    OT comments  Pt presents seated in recliner very pleasant and willing to participate in therapy session. Pt requiring minA for room level mobility tasks using RW and tolerating perofmring standing grooming ADL with minA for balance throughout. She continues to demonstrate impaired cognition including STM and attention deficits - though noted improvements from previous therapy sessions. Max HR noted up to 118 bpm with standing activity. Feel pt remains appropriate candidate for CIR level therapies at time of discharge to maximize her overall safety and independence with ADL and mobility prior to return home.    Follow Up Recommendations  CIR    Equipment Recommendations  None recommended by OT          Precautions / Restrictions Precautions Precautions: Fall Precaution Comments: high risk for falls Restrictions Weight Bearing Restrictions: No       Mobility Bed Mobility               General bed mobility comments: OOB in recliner   Transfers Overall transfer level: Needs assistance Equipment used: Rolling walker (2 wheeled) Transfers: Sit to/from Stand Sit to Stand: Min assist         General transfer comment: assist for balance, cues for safety and hand placement, assist for controlled descent     Balance Overall balance assessment: Needs assistance Sitting-balance support: Feet supported Sitting balance-Leahy  Scale: Fair     Standing balance support: Bilateral upper extremity supported;During functional activity   Standing balance comment: minA for balance during functional tasks                            ADL either performed or assessed with clinical judgement   ADL Overall ADL's : Needs assistance/impaired     Grooming: Oral care;Minimal assistance;Standing Grooming Details (indicate cue type and reason): for balance                              Functional mobility during ADLs: Minimal assistance;Rolling walker                         Cognition Arousal/Alertness: Awake/alert Behavior During Therapy: WFL for tasks assessed/performed Overall Cognitive Status: Impaired/Different from baseline Area of Impairment: Attention;Safety/judgement;Memory                   Current Attention Level: Selective Memory: Decreased short-term memory   Safety/Judgement: Decreased awareness of deficits;Decreased awareness of safety     General Comments: appears to have improved awareness this session; sequencing through oral care task without cues; noted STM deficits with recall/repetition of address (administered Short Blessed Test), pt receiving gross score of 6 on short blessed test (indicative of questionable impairment)        Exercises     Shoulder Instructions       General Comments  Pertinent Vitals/ Pain       Pain Assessment: No/denies pain  Home Living                                          Prior Functioning/Environment              Frequency  Min 2X/week        Progress Toward Goals  OT Goals(current goals can now be found in the care plan section)  Progress towards OT goals: Progressing toward goals  Acute Rehab OT Goals Patient Stated Goal: agreeable to working with therapies to return to independent OT Goal Formulation: With patient Time For Goal Achievement: 08/09/20 Potential to Achieve  Goals: Good ADL Goals Pt Will Perform Eating: with min assist;sitting Pt Will Perform Grooming: with min assist;sitting Additional ADL Goal #1: Pt will sit on side of bed for 5 minutes min assist while attempting grooming task with min assist for balance. Additional ADL Goal #2: Pt will follow one step commands consistently with 100% accuracy to increase participation in  therapy. Additional ADL Goal #3: Pt will stand for 30 seconds with min assist in prep for adls in standing.  Plan Discharge plan needs to be updated    Co-evaluation                 AM-PAC OT "6 Clicks" Daily Activity     Outcome Measure   Help from another person eating meals?: A Little Help from another person taking care of personal grooming?: A Little Help from another person toileting, which includes using toliet, bedpan, or urinal?: A Lot Help from another person bathing (including washing, rinsing, drying)?: A Lot Help from another person to put on and taking off regular upper body clothing?: A Little Help from another person to put on and taking off regular lower body clothing?: A Lot 6 Click Score: 15    End of Session Equipment Utilized During Treatment: Gait belt;Rolling walker  OT Visit Diagnosis: Unsteadiness on feet (R26.81);Other symptoms and signs involving cognitive function   Activity Tolerance Patient tolerated treatment well   Patient Left in chair;with call bell/phone within reach   Nurse Communication Mobility status        Time: 0814-4818 OT Time Calculation (min): 19 min  Charges: OT General Charges $OT Visit: 1 Visit OT Treatments $Self Care/Home Management : 8-22 mins  Marcy Siren, OT Acute Rehabilitation Services Pager [phone] Office [phone]   Erin Donaldson 07/31/2020, 1:55 PM

## 2020-07-31 NOTE — TOC Initial Note (Signed)
Transition of Care [facility]) - Initial/Assessment Note    Patient Details  Name: Erin Donaldson MRN: [id number] Date of Birth: 21-May-1951  Transition of Care [facility]) CM/SW Contact:    Vinie Sill, [place] Phone Number: 07/31/2020, 3:37 PM  Clinical Narrative:                  CSW met with patient at bedside. CSW introduced self and explained role. CSW discussed with patient SNF as back up plan to CIR. Patient declined SNF and states she will discharge home. Patient states she states she has all the equipment she needs at home but may request a raised commode seat. She states her daughter will be there to assist her who is a CNA. She will have 24/7 supervision in the home with the support of family and friends.  Thurmond Butts, MSW, [place] Clinical Social Worker    Expected Discharge Plan: IP Rehab Facility Barriers to Discharge: Insurance Authorization   Patient Goals and CMS Choice        Expected Discharge Plan and Services Expected Discharge Plan: [place] In-house Referral: Clinical Social Work     Living arrangements for the past 2 months: [place]                                      Prior Living Arrangements/Services Living arrangements for the past 2 months: Single Family Home   Patient language and need for interpreter reviewed:: No        Need for Family Participation in Patient Care: Yes (Comment) Care giver support system in place?: Yes (comment)   Criminal Activity/Legal Involvement Pertinent to Current Situation/Hospitalization: No - Comment as needed  Activities of Daily Living      Permission Sought/Granted                  Emotional Assessment Appearance:: Appears stated age Attitude/Demeanor/Rapport: Engaged, Self-Confident Affect (typically observed): Accepting, Pleasant, Appropriate Orientation: : Oriented to Self, Oriented to Place, Oriented to  Time, Oriented to Situation Alcohol / Substance Use: Not  Applicable Psych Involvement: No (comment)  Admission diagnosis:  Lower urinary tract infectious disease [N39.0] Subdural hematoma (HCC) [S06.5X9A] Subdural hematoma, chronic (HCC) [I62.03] Patient Active Problem List   Diagnosis Date Noted  . Subdural hematoma, chronic ([place]) 07/24/2020  . Subdural hematoma ([place]) 07/23/2020  . Generalized edema 07/16/2020  . Hospital discharge follow-up 07/16/2020  . Acute subdural hematoma ([place]) 07/07/2020  . Acute pain of right shoulder 07/02/2020  . Rash due to allergy 07/02/2020  . AKI (acute kidney injury) ([place]) 06/20/2020  . Hypokalemia 06/20/2020  . Acute lower UTI 06/20/2020  . Vitamin D deficiency 10/03/2018  . Overweight (BMI 25.0-29.9) 04/26/2018  . Senile purpura ([place]) 04/04/2018  . Abnormal EKG 12/29/2017  . Iron deficiency anemia 01/24/2016  . Swelling of right lower extremity 07/15/2015  . Right hip pain 07/15/2015  . Microcytic anemia 07/15/2015  . Elevated LFTs   . History of gastric bypass 01/15/2014  . Amnestic disorder due to medical condition ([place]) 08/23/2013  . Diarrhea 07/19/2013  . Bipolar affective disorder in remission ([place]) 08/26/2009  . ANXIETY 08/26/2009  . Migraine headache 08/26/2009  . Hyperlipidemia associated with type 2 diabetes mellitus ([place]) 08/15/2009  . Hyperlipidemia 08/15/2009  . Essential hypertension 08/15/2009  . GERD 08/15/2009  . CONSTIPATION 08/15/2009  . IRRITABLE BOWEL SYNDROME 08/15/2009   PCP:  Lajuana Ripple,  Koleen Distance, DO Pharmacy:   [id number] [id number]: [phone]: [phone] [id number]: [phone]: [phone]    Social Determinants of Health (SDOH) Interventions    Readmission Risk Interventions Readmission Risk Prevention Plan 07/09/2020  Post Dischage Appt Complete  Medication  Screening Complete  Transportation Screening Complete  Some recent data might be hidden

## 2020-08-01 ENCOUNTER — Other Ambulatory Visit: Payer: Self-pay | Admitting: Psychiatry

## 2020-08-01 DIAGNOSIS — M130 Polyarthritis, unspecified: Secondary | ICD-10-CM

## 2020-08-01 NOTE — Progress Notes (Signed)
Physical Therapy Treatment Patient Details Name: Erin Donaldson MRN: [id number] DOB: [birth year] Today's Date: 08/01/2020    History of Present Illness Pt is a 69 y/o female with PMH of HTN, bipolar disorder, DM, IBS presenting after a fall (lost her balance and fell backwards hitting her head). REports neck and R shoulder pain, xrays negative. Head CT reveals bilateral acute subdural hematomas, Larger on the L with very little midline shift and non displaced occipital skull fx. Pt readmitted 9/8 for burhole evacuation of chronic subdural on the Left.     PT Comments    Patient continues to progress gradually with acute therapy. Pt requires cues for sequencing and light assist for bed mobility and transfers with RW. Pt continues to have decreased awareness of Rt LE weakness and obstacles requiring assist/cues for safety. Patient began with step-to gait and progressed to step-through pattern with narrow BOS. Visual/verbal cues used for wider step. Pt fatigued at EOS and decreased awareness of strength deficits stating she could continue walking. Pt then with limited Rt LE advancement and Rt LE buckling at end of gait. Min assist required to control sit to recliner.    Follow Up Recommendations  CIR     Equipment Recommendations  3in1 (PT)    Recommendations for Other Services Rehab consult     Precautions / Restrictions Precautions Precautions: Fall Precaution Comments: high risk for falls Restrictions Weight Bearing Restrictions: No    Mobility  Bed Mobility Overal bed mobility: Needs Assistance Bed Mobility: Supine to Sit     Supine to sit: Min assist;HOB elevated     General bed mobility comments: cues for sequencing use of bed rail to raise trunk up and cues to bring LE's off EOB fully. Assist to raise and steady trunk. Min guard for pt to scoot to EOB.  Transfers Overall transfer level: Needs assistance Equipment used: Rolling walker (2 wheeled) Transfers: Sit to/from  Stand Sit to Stand: Min assist         General transfer comment: cues for safe hand placement and assist to steady with rise. pt required cues to sit at end of gait due to Rt LE buckling, min assist for controlled descent to recliner.  Ambulation/Gait Ambulation/Gait assistance: Min assist Gait Distance (Feet): 200 Feet Assistive device: Rolling walker (2 wheeled) Gait Pattern/deviations: Step-to pattern;Step-through pattern;Decreased step length - right;Decreased stride length;Drifts right/left;Narrow base of support Gait velocity: decr Gait velocity interpretation: <1.8 ft/sec, indicate of risk for recurrent falls General Gait Details: pt with decr Rt attention and tendency to drift to Rt within RW. Cues required to progress to step through pattern and assist to manage walker position. Attempted use of visual cues to increase step width as pt has NBOS and Rt LE externally rotated. Pt fatiguing at end of gait and Rt LE dragging behind and giving out.    Stairs             Wheelchair Mobility    Modified Rankin (Stroke Patients Only)       Balance Overall balance assessment: Needs assistance Sitting-balance support: Feet supported Sitting balance-Leahy Scale: Fair     Standing balance support: Bilateral upper extremity supported;During functional activity Standing balance-Leahy Scale: Poor Standing balance comment: requires external support, 1x LOB during gait.           Cognition Arousal/Alertness: Awake/alert Behavior During Therapy: WFL for tasks assessed/performed Overall Cognitive Status: Impaired/Different from baseline Area of Impairment: Safety/judgement;Memory          Memory: Decreased  short-term memory   Safety/Judgement: Decreased awareness of deficits;Decreased awareness of safety     General Comments: pt did not require cues for sequencing trasnfers, cues needed for safety however with proximity to RW for transfer and gait. pt following cues  to look up/ahead and scan during gait. improved memory with this cue as pt questioned later instruction to look down to increase step width during gait.      Exercises      General Comments        Pertinent Vitals/Pain Pain Assessment: Faces Faces Pain Scale: Hurts a little bit Pain Location: reflux discomfort Pain Descriptors / Indicators: Burning;Discomfort Pain Intervention(s): Monitored during session;Patient requesting pain meds-RN notified           PT Goals (current goals can now be found in the care plan section) Acute Rehab PT Goals Patient Stated Goal: agreeable to working with therapies to return to independent PT Goal Formulation: With patient Time For Goal Achievement: 08/09/20 Potential to Achieve Goals: Fair Progress towards PT goals: Progressing toward goals    Frequency    Min 4X/week      PT Plan Current plan remains appropriate    Co-evaluation              AM-PAC PT "6 Clicks" Mobility   Outcome Measure  Help needed turning from your back to your side while in a flat bed without using bedrails?: A Little Help needed moving from lying on your back to sitting on the side of a flat bed without using bedrails?: A Little Help needed moving to and from a bed to a chair (including a wheelchair)?: A Little Help needed standing up from a chair using your arms (e.g., wheelchair or bedside chair)?: A Little Help needed to walk in hospital room?: A Lot Help needed climbing 3-5 steps with a railing? : A Lot 6 Click Score: 16    End of Session Equipment Utilized During Treatment: Gait belt Activity Tolerance: Patient tolerated treatment well Patient left: in chair;with call bell/phone within reach Nurse Communication: Mobility status;Other (comment) (pt request meds for GERD) PT Visit Diagnosis: Other abnormalities of gait and mobility (R26.89);Muscle weakness (generalized) (M62.81);Difficulty in walking, not elsewhere classified (R26.2);Other  symptoms and signs involving the nervous system (R29.898)     Time: 1660-6301 PT Time Calculation (min) (ACUTE ONLY): 23 min  Charges:  $Gait Training: 8-22 mins $Therapeutic Activity: 8-22 mins                     Wynn Maudlin, DPT Acute Rehabilitation Services  Office [phone] Pager [phone]  08/01/2020 2:15 PM

## 2020-08-01 NOTE — Progress Notes (Signed)
Patient ID: Erin Donaldson, female   DOB: 29-Oct-1951, 70 y.o.   MRN: [id number] BP (!) 149/90 (BP Location: Left Arm)   Pulse (!) 103   Temp 98.4 F (36.9 C) (Oral)   Resp 19   Ht 5\' 4"  (1.626 m)   Wt 73.3 kg   SpO2 99%   BMI 27.74 kg/m  Alert and oriented x 4, speech is clear and fluent Moving all extremities well Occipital wound breaking down in the middle.will have her see plastics, Dr. as an outpatient Discharge tomorrow.

## 2020-08-01 NOTE — Progress Notes (Signed)
Pt told this RN she believes that she has a UTI. Pt has had urgency and frequency today, denies burning sensation.   Robina Ade, RN

## 2020-08-01 NOTE — Progress Notes (Signed)
Patient ID: Erin Donaldson, female   DOB: Jan 08, 1951, 69 y.o.   MRN: [id number] Alert and oriented x 4 Awaiting insurance approval Wound is breaking down in the middle [place] hole incisions look good.  Moving all extremities well

## 2020-08-01 NOTE — Progress Notes (Signed)
Pt requesting additional Protonix. She says she takes it BID at home. Message left with Dr. Sueanne Margarita RN assistant as he is in surgery. Pt also still has order for q1 neuro checks. Request left with assistant for check to be q4 or qshift.   Robina Ade, RN

## 2020-08-01 NOTE — Progress Notes (Signed)
Inpatient Rehab Admissions Coordinator:   I met with patient at bedside to notify her that I do not have a CIR bed available for this pt. Today. I am awaiting insurance authorization.   Clemens Catholic, [place], [place] Admissions Coordinator  [phone] ([place]) [phone] (office)

## 2020-08-01 NOTE — Progress Notes (Addendum)
Inpatient Rehab Admissions Coordinator:   I met with patient at bedside to notify her that her insurance has not approved CIR admission. They gave the option to have MD do a peer-to-peer with their medical director; however, Pt. States that she does not want to pursue CIR any further. She states that she wants to go home with home health and that her daughter is available to provide 24/7 assistance. CIR will sign off at this time.   Clemens Catholic, [place], [place] Admissions Coordinator  [phone] ([place]) [phone] (office)

## 2020-08-01 NOTE — Care Management Important Message (Signed)
Important Message  Patient Details  Name: Erin Donaldson MRN: [id number] Date of Birth: Apr 13, 1951   Medicare Important Message Given:  Yes - Important Message mailed due to current National Emergency  Verbal consent obtained due to current National Emergency  Relationship to patient: Self Contact Name: Kaydin Labo Call Date: 08/01/20  Time: 1414 Phone: [phone] Outcome: Spoke with contact Important Message mailed to: Patient address on file    Orson Aloe 08/01/2020, 2:14 PM

## 2020-08-02 LAB — URINALYSIS, ROUTINE W REFLEX MICROSCOPIC
Bacteria, UA: NONE SEEN
Bilirubin Urine: NEGATIVE
Glucose, UA: NEGATIVE mg/dL
Hgb urine dipstick: NEGATIVE
Ketones, ur: NEGATIVE mg/dL
Nitrite: NEGATIVE
Protein, ur: NEGATIVE mg/dL
Specific Gravity, Urine: 1.01 (ref 1.005–1.030)
pH: 6 (ref 5.0–8.0)

## 2020-08-02 NOTE — Telephone Encounter (Signed)
Last seen 10/25/2019. Did not follow up and no upcoming appts.

## 2020-08-02 NOTE — Progress Notes (Signed)
Dressing changed and all discharge information covered. All questions answered and equipment covered. [facility] sent home with patient and family.

## 2020-08-02 NOTE — TOC Transition Note (Signed)
Transition of Care (TOC) - CM/SW Discharge Note Donn Pierini RN,BSN Transitions of Care Unit 4NP (non trauma) - RN Case Manager See Treatment Team for direct Phone #   Patient Details  Name: Erin Donaldson MRN: [id number] Date of Birth: [birth year]  Transition of Care [facility]) CM/SW Contact:  Darrold Span, RN Phone Number: 08/02/2020, 2:15 PM   Clinical Narrative:    Pt stable for transition home, insurance has denied CIR stay and pt wants to return home with [facility] services- CM spoke with pt at bedside- pt requesting RW and 3n1 for home- orders have been placed. Discussed HH needs- per pt she has used [facility] and [facility] in the past- list provided for [facility] choice Per CMS guidelines from medicare.gov website with star ratings (copy placed in shadow chart)- pt is fine with either [facility] or [facility] and if neither of them can provide services then defers to CM to find an agency. Address, phone # and PCP all confirmed with pt in epic.   Call made to [employer] [facility] line- for RW and 3n1- DME to be delivered to room prior to discharge.   Call made to [facility] with [facility] for [facility] needs- referral has been accepted- with [facility] for the weekend- upon charting in epic noted that pt had a referral to [facility] 3 wks ago- call made to [facility] with [facility] who informed TOC that pt is still active with them for HHPT/OT- pt will resume services with [facility] on discharge- call made to Lupita Leash with [facility] to inform pt already active with [facility].   MD to write HHorders  for resumption of HHPT/OT     Final next level of care: Home w Home Health Services Barriers to Discharge: Barriers Resolved   Patient Goals and CMS Choice Patient states their goals for this hospitalization and ongoing recovery are:: "to get back on my feet"      Discharge Placement               Home with [facility]        Discharge Plan and Services In-house Referral: Clinical Social Work   Post Acute Care Choice: Horticulturist, commercial, Home Health          DME  Arranged: 3-N-1, [place] rolling DME Agency: [employer] Date DME Agency Contacted: 08/02/20 Time DME Agency Contacted: 1230 Representative spoke with at DME Agency: Eunice Blase HH Arranged: PT, OT HH Agency: [facility] Date [facility] Agency Contacted: 08/02/20 Time HH Agency Contacted: 1413 Representative spoke with at [facility] Agency: Lorenza Chick  Social Determinants of Health (SDOH) Interventions     Readmission Risk Interventions Readmission Risk Prevention Plan 08/02/2020 07/09/2020  Post Dischage Appt - Complete  Medication Screening - Complete  Transportation Screening Complete Complete  PCP or Specialist Appt within 3-5 Days Complete -  HRI or Home Care Consult Complete -  Social Work Consult for Recovery Care Planning/Counseling Complete -  Palliative Care Screening Not Applicable -  Medication Review [facility]) Complete -  Some recent data might be hidden

## 2020-08-02 NOTE — Telephone Encounter (Signed)
review 

## 2020-08-02 NOTE — Discharge Summary (Signed)
Physician Discharge Summary  Patient ID: Erin Donaldson MRN: [id number] DOB/AGE: July 03, 1951 69 y.o.  Admit date: 07/23/2020 Discharge date: 08/02/2020  Admission Diagnoses:Subdural hematoma, Scalp laceration  Discharge Diagnoses: same Active Problems:   Subdural hematoma (HCC)   Subdural hematoma, chronic (HCC)   Discharged Condition: good  Hospital Course: Erin Donaldson was admitted and taken to the operating room where I placed a ventricular catheter in order to drain the chronic subdural hematoma. She has done well with this. The scalp wound will need close monitoring. She will see the plastic surgery team after discharge.  She is alert, moving all extremities well at discharge. She was turned down by her insurance company for inpatient rehabilitation which was recommended by our PT and rehab services.   Treatments: surgery: as above  Discharge Exam: Blood pressure 134/84, pulse 100, temperature 97.6 F (36.4 C), temperature source Oral, resp. rate 17, height 5\' 4"  (1.626 m), weight 73.3 kg, SpO2 95 %. General appearance: alert, cooperative and no distress Incision/Wound:some breakdown in the central portion of the wound. clean  Disposition: Discharge disposition: 01-Home or Self Care      Subdural hemorrhage, Scalp laceration  Allergies as of 08/02/2020      Reactions   Bactrim [sulfamethoxazole-trimethoprim]    Raw and irritatated in mouth.   Darvocet [propoxyphene N-acetaminophen]    Decongestant [oxymetazoline]    Elavil [amitriptyline]    Lithium Other (See Comments)   Hx toxicity   Risperidone And Related    Ultram [tramadol]    Cephalosporins Rash, Other (See Comments)   Keflex- Higher dose cause a rash   Keflex [cephalexin] Rash   Omnicef [cefdinir] Rash      Medication List    STOP taking these medications   ibuprofen 200 MG tablet Commonly known as: ADVIL     TAKE these medications   acetaminophen-codeine 300-30 MG tablet Commonly known as:  TYLENOL #3 Take 1 tablet by mouth every 6 (six) hours as needed for moderate pain.   albuterol 108 (90 Base) MCG/ACT inhaler Commonly known as: VENTOLIN HFA Inhale 2 puffs into the lungs every 6 (six) hours as needed for wheezing or shortness of breath.   ascorbic acid 500 MG tablet Commonly known as: VITAMIN C Take 500-1,000 mg by mouth daily.   cetirizine 10 MG tablet Commonly known as: ZYRTEC Take 1 tablet (10 mg total) by mouth daily.   cyclobenzaprine 10 MG tablet Commonly known as: FLEXERIL Take 0.5-1 tablets (5-10 mg total) by mouth 2 (two) times daily as needed for muscle spasms.   donepezil 10 MG tablet Commonly known as: ARICEPT TAKE 1 TABLET BY MOUTH EVERY DAY   DULoxetine 60 MG capsule Commonly known as: CYMBALTA TAKE 1 CAPSULE BY MOUTH EVERY DAY   fluticasone 50 MCG/ACT nasal spray Commonly known as: FLONASE SPRAY 2 SPRAYS INTO EACH NOSTRIL EVERY DAY   furosemide 20 MG tablet Commonly known as: LASIX Take 10 mg by mouth every other day.   gabapentin 300 MG capsule Commonly known as: NEURONTIN TAKE 3 CAPSULES BY MOUTH THREE TIMES A DAY What changed: See the new instructions.   lamoTRIgine 200 MG tablet Commonly known as: LAMICTAL TAKE 1 TABLET BY MOUTH EVERY DAY   meclizine 25 MG tablet Commonly known as: ANTIVERT Take 1 tablet (25 mg total) by mouth 3 (three) times daily as needed for dizziness.   multivitamin Liqd Take 15 mLs by mouth daily.   nystatin cream Commonly known as: MYCOSTATIN Apply topically 2 (two) times daily.  pantoprazole 40 MG tablet Commonly known as: PROTONIX TAKE 2 TABLETS BY MOUTH EVERY DAY What changed: how much to take   QUEtiapine 200 MG tablet Commonly known as: SEROQUEL TAKE 1 TABLET BY MOUTH EVERY DAY What changed:   how to take this  when to take this  additional instructions   QUEtiapine 400 MG tablet Commonly known as: SEROQUEL TAKE 2 TABLETS (800 MG TOTAL) BY MOUTH AT BEDTIME. What changed: Another  medication with the same name was changed. Make sure you understand how and when to take each.   rizatriptan 10 MG tablet Commonly known as: MAXALT Take 1 tablet (10 mg total) by mouth as needed. May repeat in 2 hours if needed   Vitamin D (Ergocalciferol) 1.25 MG (50000 UNIT) Caps capsule Commonly known as: DRISDOL Take 50,000 Units by mouth 2 (two) times a week.            Durable Medical Equipment  (From admission, onward)         Start     Ordered   08/02/20 1127  For home use only DME 3 n 1  Once        08/02/20 1126          Follow-up Information    Coletta Memos, MD Follow up in 1 week(s).   Specialty: Neurosurgery Why: please call to make an appt to remove the sutures Contact information: [address] [place] [address] [phone]        Peggye Form, DO. Call.   Specialty: Plastic Surgery Why: please call to make an appt for 1 week Contact information: [address] [place] [address] [phone]               Signed: Coletta Memos 08/02/2020, 11:42 AM

## 2020-08-02 NOTE — Progress Notes (Signed)
Physical Therapy Treatment Patient Details Name: Erin Donaldson MRN: [id number] DOB: [birth year] Today's Date: 08/02/2020    History of Present Illness Pt is a 69 y/o female with PMH of HTN, bipolar disorder, DM, IBS presenting after a fall (lost her balance and fell backwards hitting her head). REports neck and R shoulder pain, xrays negative. Head CT reveals bilateral acute subdural hematomas, Larger on the L with very little midline shift and non displaced occipital skull fx. Pt readmitted 9/8 for burhole evacuation of chronic subdural on the Left.     PT Comments    Treatment focused on education.  Attempted to contact daughter for education on level of assistance, but she was unavailable and did not return my call so pt educated on fall prevention and how to have family help her utilizing gait belt and standing on her R side at times to assist with keeping walker straight.  Follow up PT at home recommended.  Noted plans now for home this pm.     Follow Up Recommendations  Home health PT;Supervision/Assistance - 24 hour     Equipment Recommendations  3in1 (PT);Rolling walker with 5" wheels    Recommendations for Other Services       Precautions / Restrictions Precautions Precautions: Fall    Mobility  Bed Mobility               General bed mobility comments: deferred as pending d/c home  Transfers                    Ambulation/Gait                 Stairs             Wheelchair Mobility    Modified Rankin (Stroke Patients Only)       Balance                                            Cognition Arousal/Alertness: Awake/alert Behavior During Therapy: WFL for tasks assessed/performed Overall Cognitive Status: Impaired/Different from baseline Area of Impairment: Safety/judgement;Memory                     Memory: Decreased short-term memory   Safety/Judgement: Decreased awareness of safety;Decreased  awareness of deficits            Exercises      General Comments General comments (skin integrity, edema, etc.): Attempted to call pt's daughter but she was unavailable and has not called back.  Educated pt about need for use of gait belt at home and assistance whenever up on her feet wtih the walker.  Instructed for her caregiver to stay on the R side and to assist to keep walker going straight and to ensure R LE not buckling, etc.  Also educated in safety precations for fall risk reduction in the home including footwear, lighting, having assistance, keeping clear pathway to bathroom and use of mat in the shower.      Pertinent Vitals/Pain Pain Assessment: No/denies pain    Home Living                      Prior Function            PT Goals (current goals can now be found in the care plan section) Progress towards PT goals: Progressing toward  goals    Frequency    Min 4X/week      PT Plan Discharge plan needs to be updated    Co-evaluation              AM-PAC PT "6 Clicks" Mobility   Outcome Measure  Help needed turning from your back to your side while in a flat bed without using bedrails?: A Little Help needed moving from lying on your back to sitting on the side of a flat bed without using bedrails?: A Little Help needed moving to and from a bed to a chair (including a wheelchair)?: A Little Help needed standing up from a chair using your arms (e.g., wheelchair or bedside chair)?: A Little Help needed to walk in hospital room?: A Little Help needed climbing 3-5 steps with a railing? : A Lot 6 Click Score: 17    End of Session   Activity Tolerance: Patient tolerated treatment well Patient left: in bed   PT Visit Diagnosis: Other abnormalities of gait and mobility (R26.89);Muscle weakness (generalized) (M62.81);Difficulty in walking, not elsewhere classified (R26.2);Other symptoms and signs involving the nervous system (R29.898)     Time:  6237-6283 PT Time Calculation (min) (ACUTE ONLY): 10 min  Charges:  $Self Care/Home Management: 8-22                     Sheran Lawless, PT Acute Rehabilitation Services Pager:[phone] Office:[phone] 08/02/2020    Erin Donaldson 08/02/2020, 4:33 PM

## 2020-08-02 NOTE — Discharge Instructions (Addendum)
Subdural Hematoma  A subdural hematoma is a collection of blood between the brain and its outer covering (dura). As the amount of blood increases, pressure builds on the brain. There are two types of subdural hematomas:  Acute. This type develops shortly after a hard, direct hit to the head and causes blood to collect very quickly. This is a medical emergency. If it is not diagnosed and treated quickly, it can lead to severe brain injury or death.  Chronic. This is when bleeding develops more slowly, over weeks or months. In some cases, this type does not cause symptoms. What are the causes? This condition is caused by bleeding (hemorrhage) from a broken (ruptured) blood vessel. In most cases, a blood vessel ruptures and bleeds because of a head injury, such as from a hard, direct hit. Head injuries can happen in car accidents, falls, assaults, or while playing sports. In rare cases, a hemorrhage can happen without a known cause (spontaneously), especially if you take blood thinners (anticoagulants). What increases the risk? This condition is more likely to develop in:  Older people.  Infants.  People who take blood thinners.  People who have head injuries.  People who abuse alcohol. What are the signs or symptoms? Symptoms of this condition can vary depending on the size of the hematoma. Symptoms can be mild, severe, or life-threatening. They include:  Headaches.  Nausea or vomiting.  Changes in vision, such as double vision or loss of vision.  Changes in speech or trouble understanding what people say.  Loss of balance or trouble walking.  Weakness, numbness, or tingling in the arms or legs, especially on one side of the body.  Seizures.  Change in personality.  Increased sleepiness.  Memory loss.  Loss of consciousness.  Coma. Symptoms of acute subdural hematoma can develop over minutes or hours. Symptoms of chronic subdural hematoma may develop over weeks or  months. How is this diagnosed? This condition is diagnosed based on the results of:  A physical exam.  Tests of strength, reflexes, coordination, senses, manner of walking (gait), and facial and eye movements (neurological exam).  Imaging tests, such as an MRI or a CT scan. How is this treated? Treatment for this condition depends on the type of hematoma and how severe it is. Treatment for acute hematoma may include:  Emergency surgery to drain blood or remove a blood clot.  Medicines that help the body get rid of excess fluids (diuretics). These may help to reduce pressure in the brain.  Assisted breathing (ventilation). Treatment for chronic hematoma may include:  Observation and bed rest at the hospital.  Surgery. If you take blood thinners, you may need to stop taking them for a short time. You may also be given anti-seizure (anticonvulsant) medicine. Sometimes, no treatment is needed for chronic subdural hematoma. Follow these instructions at home: Activity  Avoid situations where you could injure your head again, such as in competitive sports, downhill snow sports, and horseback riding. Do not do these activities until your health care provider approves. ? Wear protective gear, such as a helmet, when participating in activities such as biking or contact sports.  Avoid too much visual stimulation while recovering. This means limiting how much you read and limiting your screen time on a smart phone, tablet, computer, or TV.  Rest as told by your health care provider. Rest helps the brain heal.  Try to avoid activities that cause physical or mental stress. Return to work or school as told by  your health care provider.  Do not lift anything that is heavier than 5 lb (2.3 kg), or the limit you are told, until your health care provider says that it is safe.  Do not drive, ride a bike, or use heavy machinery until your health care provider approves.  Always wear your seat belt  when you are in a motor vehicle. Alcohol use  Do not drink alcohol if your health care provider tells you not to drink.  If you drink alcohol, limit how much you use to: ? 0-1 drink a day for women. ? 0-2 drinks a day for men. General instructions  Monitor your symptoms, and ask people around you to do the same. Recovery from brain injuries varies. Talk with your health care provider about what to expect.  Take over-the-counter and prescription medicines only as told by your health care provider. Do not take blood thinners or NSAIDs unless your health care provider approves. These include aspirin, ibuprofen, naproxen, and warfarin.  Keep your home environment safe to reduce the risk of falling.  Keep all follow-up visits as told by your health care provider. This is important. Where to find more information  [employer] of Neurological Disorders and Stroke: [employer].co.uk  American Academy of Neurology (AAN): ComparePet.cz  Brain Injury Association of America: www.biausa.org Get help right away if you:  Are taking blood thinners and you fall or you experience minor trauma to the head. If you take any blood thinners, even a very small injury can cause a subdural hematoma.  Have a bleeding disorder and you fall or you experience minor trauma to the head.  Develop any of the following symptoms after a head injury: ? Clear fluid draining from your nose or ears. ? Nausea or vomiting. ? Changes in speech or trouble understanding what people say. ? Seizures. ? Drowsiness or a decrease in alertness. ? Double vision. ? Numbness or inability to move (paralysis) in any part of your body. ? Difficulty walking or poor coordination. ? Difficulty thinking. ? Confusion or forgetfulness. ? Personality changes. ? Irrational or aggressive behavior. These symptoms may represent a serious problem that is an emergency. Do not wait to see if the symptoms will go away. Get medical help  right away. Call your local emergency services (911 in the [place] Do not drive yourself to the hospital. Summary  A subdural hematoma is a collection of blood between the brain and its outer covering (dura).  Treatment for this condition depends on what type of subdural hematoma you have and how severe it is.  Symptoms can vary from mild to severe to life-threatening.  Monitor your symptoms, and ask others around you to do the same. This information is not intended to replace advice given to you by your health care provider. Make sure you discuss any questions you have with your health care provider. Please monitor scalp wound closely. If wound drains purulent fluid please call to come in to the office.

## 2020-08-05 ENCOUNTER — Ambulatory Visit (INDEPENDENT_AMBULATORY_CARE_PROVIDER_SITE_OTHER): Payer: Medicare Other

## 2020-08-05 ENCOUNTER — Other Ambulatory Visit: Payer: Self-pay

## 2020-08-05 NOTE — Telephone Encounter (Signed)
review 

## 2020-08-05 NOTE — Telephone Encounter (Signed)
Looks like she has been in and out of hospital medically

## 2020-08-07 ENCOUNTER — Other Ambulatory Visit: Payer: Self-pay | Admitting: *Deleted

## 2020-08-07 DIAGNOSIS — J029 Acute pharyngitis, unspecified: Secondary | ICD-10-CM

## 2020-08-07 DIAGNOSIS — U071 COVID-19: Secondary | ICD-10-CM

## 2020-08-07 MED ORDER — ALBUTEROL SULFATE HFA 108 (90 BASE) MCG/ACT IN AERS: 2.0000 | INHALATION_SPRAY | Freq: Four times a day (QID) | RESPIRATORY_TRACT | 0 refills | Status: DC | PRN

## 2020-08-09 ENCOUNTER — Ambulatory Visit (INDEPENDENT_AMBULATORY_CARE_PROVIDER_SITE_OTHER): Payer: Medicare Other | Admitting: Plastic Surgery

## 2020-08-09 ENCOUNTER — Other Ambulatory Visit: Payer: Self-pay

## 2020-08-09 ENCOUNTER — Encounter: Payer: Self-pay | Admitting: Plastic Surgery

## 2020-08-09 VITALS — BP 94/67 | HR 98 | Temp 98.2°F

## 2020-08-09 DIAGNOSIS — S0101XA Laceration without foreign body of scalp, initial encounter: Secondary | ICD-10-CM | POA: Diagnosis not present

## 2020-08-09 DIAGNOSIS — Z09 Encounter for follow-up examination after completed treatment for conditions other than malignant neoplasm: Secondary | ICD-10-CM | POA: Diagnosis not present

## 2020-08-09 DIAGNOSIS — S0100XA Unspecified open wound of scalp, initial encounter: Secondary | ICD-10-CM | POA: Insufficient documentation

## 2020-08-09 NOTE — Progress Notes (Signed)
   Subjective:    Patient ID: Erin Donaldson, female    DOB: 1951/04/13, 69 y.o.   MRN: [id number]  Patient is a 69 year old female here for follow-up on her scalp wound.  She is in a wheelchair and has a friend with her.  The patient has had multiple falls at home that have required visits to the emergency room.  Two weeks ago she had a fall with a scalp laceration.  She was admitted and cared for by neurosurgery.  There was concern about the wound healing.  It was closed by neurosurgery while they were in the OR for subdural hematoma treatment.  The area looks really good.  It appears that it is going to heal without any further intervention.  The sutures are in place.  There is no sign of infection.  She complains of frequent headaches and will mention this to Dr. Franky Macho today.    Review of Systems  Constitutional: Positive for activity change. Negative for appetite change.  Eyes: Negative.   Respiratory: Negative.   Cardiovascular: Negative.   Gastrointestinal: Negative.   Genitourinary: Negative.   Skin: Positive for wound.  Psychiatric/Behavioral: Negative.        Objective:   Physical Exam Vitals and nursing note reviewed.  Constitutional:      Appearance: Normal appearance.  HENT:     Head:   Cardiovascular:     Rate and Rhythm: Normal rate.     Pulses: Normal pulses.  Pulmonary:     Effort: Pulmonary effort is normal.  Skin:    General: Skin is warm.  Neurological:     Mental Status: She is alert. Mental status is at baseline.  Psychiatric:        Mood and Affect: Mood normal.        Behavior: Behavior normal.         Assessment & Plan:     ICD-10-CM   1. Hospital discharge follow-up  Z09   2. Laceration of scalp without foreign body, initial encounter  S01.01XA     The patient has a follow-up appointment with Dr. Mikal Plane today.  I went ahead and removed the sutures from the posterior and anterior scalp.  Follow-up as needed.

## 2020-08-12 ENCOUNTER — Other Ambulatory Visit: Payer: Self-pay | Admitting: Family Medicine

## 2020-08-12 NOTE — Addendum Note (Signed)
Addended by: Caryl Bis on: 08/12/2020 10:51 AM   Modules accepted: Orders

## 2020-08-12 NOTE — Telephone Encounter (Signed)
°  Prescription Request  08/12/2020  What is the name of the medication or equipment? Meclizine  Have you contacted your pharmacy to request a refill? (if applicable) Yes  Which pharmacy would you [facility] this sent to[facility] [place]  Pt says she is still feeling dizzy and needs refill on her Rx.   Patient notified that their request is being sent to the clinical staff for review and that they should receive a response within 2 business days.

## 2020-08-14 ENCOUNTER — Telehealth: Payer: Self-pay | Admitting: Family Medicine

## 2020-08-14 NOTE — Telephone Encounter (Signed)
Marcelino Duster is calling from [facility] wants to discuss medical concerns with Nadine Counts nurse. BP 88/60 Pain 8/10 Headache pain subdural hematoma  Nausea vomiting and dizziness  Medication list--rx for tylenol 3 never given to patient

## 2020-08-14 NOTE — Telephone Encounter (Signed)
Spoke with home health nurse and she states that patients HA has changed to back and front of head now. She is having nausea and vomiting and increased dizziness. Her BP is 88/62 and pain has increased. Spoke with Dr Nadine Counts and she advised to return to hospital for a scan. Notified home health nurse and she advised that she would let patient know.

## 2020-08-15 ENCOUNTER — Other Ambulatory Visit: Payer: Self-pay | Admitting: Family Medicine

## 2020-08-15 NOTE — Telephone Encounter (Signed)
°  Prescription Request  08/15/2020  What is the name of the medication or equipment? Meclazine  Have you contacted your pharmacy to request a refill? (if applicable) Yes  Which pharmacy would you like this sent to? [facility]   Patient notified that their request is being sent to the clinical staff for review and that they should receive a response within 2 business days.   [facility] pt.  Please call pt.  She called last week about this.  She has an appt October 19.

## 2020-08-15 NOTE — Telephone Encounter (Signed)
VM from [facility] nurse FYI: pt declined yesterday going to hospital as suggested for scan, Marcelino Duster did explain reactions if she did not go.  Re: refill on Meclizine - Last OV 06/14/20 Last Rf 07/16/20 Next OV 09/03/20

## 2020-08-18 ENCOUNTER — Other Ambulatory Visit: Payer: Self-pay | Admitting: Family Medicine

## 2020-08-19 ENCOUNTER — Other Ambulatory Visit: Payer: Self-pay | Admitting: Family Medicine

## 2020-08-20 ENCOUNTER — Telehealth: Payer: Self-pay | Admitting: *Deleted

## 2020-08-20 NOTE — Telephone Encounter (Signed)
FYI - TC from [facility] Pt had a resting HR 102 & vomiting about an hr ago, other vitals normal BP 130/72, T 96.2 temp, O2 96 Also fall on Friday, fell backwords and hit back on couch, didn't hit head, no c/o pain

## 2020-08-20 NOTE — Telephone Encounter (Signed)
Spoke with susan and patient has seen neuro surgery just for suture removal.  Also spoke with patient.  Patient states that she is still having dizziness, headache, vomiting, diarrhea yesterday and a fall on Friday.  Patient states that she feels better than she did.

## 2020-08-20 NOTE — Telephone Encounter (Signed)
I'm still very worried about her.  Has she contacted her specialist?  I feel like this is not typical of her condition.

## 2020-08-29 ENCOUNTER — Other Ambulatory Visit: Payer: Self-pay

## 2020-08-29 MED ORDER — MECLIZINE HCL 25 MG PO TABS: 25.0000 mg | ORAL_TABLET | Freq: Three times a day (TID) | ORAL | 0 refills | Status: DC | PRN

## 2020-08-29 NOTE — Telephone Encounter (Signed)
  Prescription Request  08/29/2020  What is the name of the medication or equipment? meclizine (ANTIVERT) 25 MG tablet    Have you contacted your pharmacy to request a refill? (if applicable) yes  Which pharmacy would you like this sent to? [facility] [place] has appt next week wants enough until then    Patient notified that their request is being sent to the clinical staff for review and that they should receive a response within 2 business days.

## 2020-08-29 NOTE — Telephone Encounter (Signed)
Please advise refill appointment with you 10/19

## 2020-08-30 ENCOUNTER — Telehealth: Payer: Self-pay | Admitting: *Deleted

## 2020-08-30 NOTE — Telephone Encounter (Signed)
VM from [place] OT w/ [facility] Cancelled today's visit having diarrhea. Also still having some nausea and dizziness, still refusing to go to the Ed to be evaluated

## 2020-09-01 ENCOUNTER — Other Ambulatory Visit: Payer: Self-pay | Admitting: Family Medicine

## 2020-09-02 ENCOUNTER — Telehealth: Payer: Self-pay | Admitting: *Deleted

## 2020-09-02 NOTE — Telephone Encounter (Signed)
FYI : VM from [place] OT w/ [facility] Pt had a fall yesterday 09/01/20, Denies hurting herself, daughter says appears she may have hit her head but no loss of consciousness. They are monitoring her for right now

## 2020-09-03 ENCOUNTER — Telehealth (INDEPENDENT_AMBULATORY_CARE_PROVIDER_SITE_OTHER): Payer: Medicare Other | Admitting: Family Medicine

## 2020-09-03 ENCOUNTER — Encounter: Payer: Self-pay | Admitting: Family Medicine

## 2020-09-03 DIAGNOSIS — E860 Dehydration: Secondary | ICD-10-CM

## 2020-09-03 DIAGNOSIS — Z9889 Other specified postprocedural states: Secondary | ICD-10-CM

## 2020-09-03 DIAGNOSIS — I951 Orthostatic hypotension: Secondary | ICD-10-CM

## 2020-09-03 DIAGNOSIS — R2689 Other abnormalities of gait and mobility: Secondary | ICD-10-CM

## 2020-09-03 DIAGNOSIS — K1379 Other lesions of oral mucosa: Secondary | ICD-10-CM

## 2020-09-03 DIAGNOSIS — R42 Dizziness and giddiness: Secondary | ICD-10-CM | POA: Diagnosis not present

## 2020-09-03 MED ORDER — MAGIC MOUTHWASH W/LIDOCAINE: ORAL | 0 refills | Status: DC

## 2020-09-03 NOTE — Progress Notes (Addendum)
MyChart Video visit  Subjective: CC: Follow-up dizziness PCP: Raliegh Ip, DO YQI:HKVQ B Treat is a 69 y.o. female. Patient provides verbal consent for consult held via video.  Due to COVID-19 pandemic this visit was conducted virtually. This visit type was conducted due to national recommendations for restrictions regarding the COVID-19 Pandemic (e.g. social distancing, sheltering in place) in an effort to limit this patient's exposure and mitigate transmission in our community. All issues noted in this document were discussed and addressed.  A physical exam was not performed with this format.   Location of patient: home Location of provider: [facility] Others present for call: daughter  1.  Dizziness Patient reports ongoing intermittent dizziness that is relieved by meclizine.  She admits that she is not drinking as much as she should.  Her lips are quite dry.  She has noted blood pressures that were low but has not taken her blood pressure in the last couple of days.  She had a recurrent fall recently.  Physical therapy and OT continue to come out but now only once weekly.  The original home health order was placed by Dr. Franky Macho and she wonders if he can get her back with twice weekly visits.  They are also concerned that she needs 24-hour supervision right now and wonder if Medicare will pay for this.   ROS: Per HPI  Allergies  Allergen Reactions  . Bactrim [Sulfamethoxazole-Trimethoprim]     Raw and irritatated in mouth.  Leodis [place] [Propoxyphene N-Acetaminophen]   . Decongestant [Oxymetazoline]   . Elavil [Amitriptyline]   . Lithium Other (See Comments)    Hx toxicity  . Risperidone And Related   . Ultram [Tramadol]   . Cephalosporins Rash and Other (See Comments)    Keflex- Higher dose cause a rash  . Keflex [Cephalexin] Rash  . Omnicef [Cefdinir] Rash   Past Medical History:  Diagnosis Date  . Anemia   . Anxiety   . Bipolar 1 disorder (HCC)   . Bipolar affective  (HCC)   . Depression   . Diabetes in pregnancy   . Diabetes mellitus   . Diabetes mellitus without complication (HCC)   . Elevated LFTs   . GERD (gastroesophageal reflux disease)   . Hyperlipidemia   . Hypertension   . IBS (irritable bowel syndrome)   . Migraine     Current Outpatient Medications:  .  furosemide (LASIX) 20 MG tablet, TAKE 1 TABLET BY MOUTH EVERY DAY, Disp: 90 tablet, Rfl: 0 .  acetaminophen-codeine (TYLENOL #3) 300-30 MG tablet, Take 1 tablet by mouth every 6 (six) hours as needed for moderate pain., Disp: 30 tablet, Rfl: 0 .  albuterol (VENTOLIN HFA) 108 (90 Base) MCG/ACT inhaler, Inhale 2 puffs into the lungs every 6 (six) hours as needed for wheezing or shortness of breath., Disp: 8 g, Rfl: 0 .  ascorbic acid (VITAMIN C) 500 MG tablet, Take 500-1,000 mg by mouth daily., Disp: , Rfl:  .  cetirizine (ZYRTEC) 10 MG tablet, Take 1 tablet (10 mg total) by mouth daily., Disp: 30 tablet, Rfl: 11 .  cyclobenzaprine (FLEXERIL) 10 MG tablet, Take 0.5-1 tablets (5-10 mg total) by mouth 2 (two) times daily as needed for muscle spasms., Disp: 60 tablet, Rfl: 2 .  donepezil (ARICEPT) 10 MG tablet, TAKE 1 TABLET BY MOUTH EVERY DAY, Disp: 90 tablet, Rfl: 0 .  DULoxetine (CYMBALTA) 60 MG capsule, TAKE 1 CAPSULE BY MOUTH EVERY DAY, Disp: 90 capsule, Rfl: 0 .  fluticasone (FLONASE) 50  MCG/ACT nasal spray, SPRAY 2 SPRAYS INTO EACH NOSTRIL EVERY DAY, Disp: 16 g, Rfl: 5 .  gabapentin (NEURONTIN) 300 MG capsule, TAKE 3 CAPSULES BY MOUTH THREE TIMES A DAY (Patient taking differently: Take 900 mg by mouth 3 (three) times daily. Pt states she take 3 capsules at bedtime and one in the morning.), Disp: 810 capsule, Rfl: 1 .  lamoTRIgine (LAMICTAL) 200 MG tablet, TAKE 1 TABLET BY MOUTH EVERY DAY, Disp: 90 tablet, Rfl: 1 .  meclizine (ANTIVERT) 25 MG tablet, Take 1 tablet (25 mg total) by mouth 3 (three) times daily as needed for dizziness., Disp: 30 tablet, Rfl: 0 .  Multiple Vitamin (MULTIVITAMIN)  LIQD, Take 15 mLs by mouth daily., Disp: 473 mL, Rfl: 1 .  nystatin cream (MYCOSTATIN), Apply topically 2 (two) times daily., Disp: 60 g, Rfl: 3 .  pantoprazole (PROTONIX) 40 MG tablet, TAKE 2 TABLETS BY MOUTH EVERY DAY (Patient taking differently: Take 40 mg by mouth daily. ), Disp: 180 tablet, Rfl: 0 .  QUEtiapine (SEROQUEL) 200 MG tablet, TAKE 1 TABLET BY MOUTH EVERY DAY (Patient taking differently: 200 mg. In the middle of the night), Disp: 90 tablet, Rfl: 3 .  QUEtiapine (SEROQUEL) 400 MG tablet, TAKE 2 TABLETS (800 MG TOTAL) BY MOUTH AT BEDTIME., Disp: 180 tablet, Rfl: 2 .  rizatriptan (MAXALT) 10 MG tablet, Take 1 tablet (10 mg total) by mouth as needed. May repeat in 2 hours if needed, Disp: 10 tablet, Rfl: 5 .  Vitamin D, Ergocalciferol, (DRISDOL) 1.25 MG (50000 UT) CAPS capsule, Take 50,000 Units by mouth 2 (two) times a week. , Disp: , Rfl: 3 GEN: NAD ENT: tacky mucous membranes Neuro: responds to questions appropriate.  Thought linear.   Assessment/ Plan: 69 y.o. female   1. Orthostasis Encourage hydration.  She was visibly dehydrated on today's video visit.  2. Dizziness Likely secondary to the above and history of bur hole surgery.  Will reach out to her specialist to determine how long the dizziness is expected to continue status post surgery  3. History of burr hole surgery Continue to follow-up with Dr. Franky Macho as scheduled  4. Balance problem  5. Dehydration  6. Oral pain - magic mouthwash w/lidocaine SOLN; Gargle and spit 59mL every 6 hours as needed for sore throat. 52mL lidocaine, 26mL nystatin, 60mg  hydrocortisone tab, qs benadryl total .  Dispense: 480 mL; Refill: 0  Furthermore, we discussed that Medicare does not offer personal care services but I did give her information that there are a couple of local places that would do this out of pocket.  We discussed that if she was dual eligible for Medicaid and Medicare we may be able to get this  ordered.  Start time: 1:12pm End time: 1:34pm  Total time spent on patient care (including video visit/ documentation): 22 minutes  Glendoris Nodarse , DO [facility] [phone]

## 2020-09-03 NOTE — Addendum Note (Signed)
Addended by: Raliegh Ip on: 09/03/2020 02:44 PM   Modules accepted: Orders

## 2020-09-04 ENCOUNTER — Telehealth: Payer: Self-pay | Admitting: *Deleted

## 2020-09-04 NOTE — Telephone Encounter (Signed)
Pt called in states that she woke up this AM with ear numbness and just recently (400pm phone call) the numbness has spread into face and going down into her neck.  She was advised to Go to the ER ASAP for evaluation.

## 2020-09-10 ENCOUNTER — Other Ambulatory Visit: Payer: Self-pay | Admitting: Family Medicine

## 2020-09-10 DIAGNOSIS — U071 COVID-19: Secondary | ICD-10-CM

## 2020-09-10 DIAGNOSIS — J029 Acute pharyngitis, unspecified: Secondary | ICD-10-CM

## 2020-09-12 ENCOUNTER — Telehealth: Payer: Self-pay

## 2020-09-12 NOTE — Telephone Encounter (Signed)
Please advise when you would like for patient to have lab work again.

## 2020-09-13 NOTE — Telephone Encounter (Signed)
She will be due for diabetes check up in 2-4 months.

## 2020-09-13 NOTE — Telephone Encounter (Signed)
Patient aware.

## 2020-09-14 ENCOUNTER — Other Ambulatory Visit: Payer: Self-pay | Admitting: Family Medicine

## 2020-09-16 NOTE — Telephone Encounter (Signed)
Please verify patient's usage of this medicine.  Last visit she reported using 1 capsule each morning and 3 capsules at bedtime.  If this is so, I will just a prescription to reflect current dosing schedule

## 2020-09-17 NOTE — Telephone Encounter (Signed)
Patient states she is now taking 3 capsules TID.

## 2020-09-22 ENCOUNTER — Other Ambulatory Visit: Payer: Self-pay | Admitting: Family Medicine

## 2020-09-22 DIAGNOSIS — M62838 Other muscle spasm: Secondary | ICD-10-CM

## 2020-09-23 ENCOUNTER — Telehealth: Payer: Self-pay

## 2020-09-23 NOTE — Telephone Encounter (Signed)
Rosalita Chessman called from [place] to let Dr Nadine Counts know that she went to check on pt today and her heart rate was 120 while resting but BP was 98/70 and oxygen was good.

## 2020-09-24 NOTE — Telephone Encounter (Signed)
Erin Donaldson with [facility] aware of recommendations.

## 2020-09-24 NOTE — Telephone Encounter (Signed)
Push fluids, check BP again.

## 2020-09-25 ENCOUNTER — Telehealth: Payer: Self-pay

## 2020-09-25 NOTE — Telephone Encounter (Signed)
If nausea and vomiting are new onset, she needs to be evaluated.  If she is unable to tolerate fluids, she needs to be evaluated.  Improved blood pressure is reassuring.  Continue to push fluids.

## 2020-09-25 NOTE — Telephone Encounter (Signed)
Patient states that she is feeling better and thinks the nausea and vomiting came from something she ate.  No longer having any nausea and vomiting and is able to tolerate fluids.  Encouraged patient to continue to increase fluids and if symptoms persist she will need to be evaluated.

## 2020-09-26 ENCOUNTER — Ambulatory Visit (INDEPENDENT_AMBULATORY_CARE_PROVIDER_SITE_OTHER): Payer: Medicare Other

## 2020-09-26 ENCOUNTER — Other Ambulatory Visit: Payer: Self-pay

## 2020-09-26 DIAGNOSIS — Z8616 Personal history of COVID-19: Secondary | ICD-10-CM

## 2020-09-26 DIAGNOSIS — K219 Gastro-esophageal reflux disease without esophagitis: Secondary | ICD-10-CM

## 2020-09-26 DIAGNOSIS — E785 Hyperlipidemia, unspecified: Secondary | ICD-10-CM

## 2020-09-26 DIAGNOSIS — D649 Anemia, unspecified: Secondary | ICD-10-CM

## 2020-09-26 DIAGNOSIS — S065X0D Traumatic subdural hemorrhage without loss of consciousness, subsequent encounter: Secondary | ICD-10-CM | POA: Diagnosis not present

## 2020-09-26 DIAGNOSIS — F0280 Dementia in other diseases classified elsewhere without behavioral disturbance: Secondary | ICD-10-CM | POA: Diagnosis not present

## 2020-09-26 DIAGNOSIS — S020XXD Fracture of vault of skull, subsequent encounter for fracture with routine healing: Secondary | ICD-10-CM

## 2020-09-26 DIAGNOSIS — E119 Type 2 diabetes mellitus without complications: Secondary | ICD-10-CM

## 2020-09-26 DIAGNOSIS — S02119D Unspecified fracture of occiput, subsequent encounter for fracture with routine healing: Secondary | ICD-10-CM

## 2020-09-26 DIAGNOSIS — Z9181 History of falling: Secondary | ICD-10-CM

## 2020-09-26 DIAGNOSIS — F319 Bipolar disorder, unspecified: Secondary | ICD-10-CM

## 2020-09-26 DIAGNOSIS — I10 Essential (primary) hypertension: Secondary | ICD-10-CM

## 2020-09-26 DIAGNOSIS — K589 Irritable bowel syndrome without diarrhea: Secondary | ICD-10-CM

## 2020-09-30 ENCOUNTER — Telehealth: Payer: Self-pay | Admitting: Family Medicine

## 2020-09-30 NOTE — Telephone Encounter (Signed)
Patient is requesting to see PCP ASAP to follow up from seeing Neuro.  No openings - can patient be put in on your acute day on Friday ?

## 2020-09-30 NOTE — Telephone Encounter (Signed)
Appointment scheduled and patient is going to call Neuro and have them fax over the notes for her appointment on Friday.  Aware we need them for the visit.

## 2020-09-30 NOTE — Telephone Encounter (Signed)
Pt would like to discuss what her neurosurgeon told her this morning

## 2020-09-30 NOTE — Telephone Encounter (Signed)
If needs to be seen in clinic please make sure she has a 30 min slot, otherwise if virtual ok to do a on Friday.  Also, need Neuro notes.  I do not see them in the EMR.  Please get these sent to me before the appointment.

## 2020-10-01 ENCOUNTER — Other Ambulatory Visit: Payer: Self-pay | Admitting: Family Medicine

## 2020-10-01 DIAGNOSIS — K219 Gastro-esophageal reflux disease without esophagitis: Secondary | ICD-10-CM

## 2020-10-04 ENCOUNTER — Encounter: Payer: Self-pay | Admitting: Family Medicine

## 2020-10-04 ENCOUNTER — Telehealth (INDEPENDENT_AMBULATORY_CARE_PROVIDER_SITE_OTHER): Payer: Medicare Other | Admitting: Family Medicine

## 2020-10-04 DIAGNOSIS — R059 Cough, unspecified: Secondary | ICD-10-CM

## 2020-10-04 DIAGNOSIS — B370 Candidal stomatitis: Secondary | ICD-10-CM

## 2020-10-04 DIAGNOSIS — R4020 Unspecified coma: Secondary | ICD-10-CM | POA: Diagnosis not present

## 2020-10-04 DIAGNOSIS — R112 Nausea with vomiting, unspecified: Secondary | ICD-10-CM

## 2020-10-04 DIAGNOSIS — R634 Abnormal weight loss: Secondary | ICD-10-CM

## 2020-10-04 MED ORDER — BENZONATATE 200 MG PO CAPS: 200.0000 mg | ORAL_CAPSULE | Freq: Three times a day (TID) | ORAL | 0 refills | Status: DC | PRN

## 2020-10-04 MED ORDER — MECLIZINE HCL 25 MG PO TABS
ORAL_TABLET | ORAL | 3 refills | Status: AC
Start: 2020-10-04 — End: ?

## 2020-10-04 MED ORDER — NYSTATIN 100000 UNIT/ML MT SUSP: 5.0000 mL | Freq: Four times a day (QID) | OROMUCOSAL | 0 refills | Status: AC

## 2020-10-04 NOTE — Progress Notes (Signed)
MyChart Video visit  Subjective: CC: f/u LOC PCP: Erin Norlander, Erin Donaldson Erin Donaldson is a 69 y.o. female. Patient provides verbal consent for consult held via video.  Due to COVID-19 pandemic this visit was conducted virtually. This visit type was conducted due to national recommendations for restrictions regarding the COVID-19 Pandemic (e.g. social distancing, sheltering in place) in an effort to limit this patient's exposure and mitigate transmission in our community. All issues noted in this document were discussed and addressed.  A physical exam was not performed with this format.   Location of patient: home Location of provider: [facility] Others present for call: none  1. LOC Patient here for follow-up on recent neurosurgery evaluation. Her neurosurgeon has asked that she have cardiac evaluation given loss of consciousness. Patient notes that she has been seen by Dr. Percival Spanish previously for EKG irregularities but she tells me that this was found to ultimately be normal. Of course this evaluation was greater than 15 years ago. She denies any heart palpitations. No known blood loss. She continues to have intermittent dizziness. She is hydrating well. She admits to weight loss. She has ongoing nausea and vomiting. She has not followed up with her gastroenterologist because Dr. Maurene Capes retired and she has not established with a new gastroenterologist. She is down to 129 pounds.  Her last neurologic examination was unremarkable. He did not feel that her ongoing symptoms are related to subdural hematoma status post surgical intervention.  2. Thrush Patient with ongoing thrush. She would like to get a renewal on her nystatin  3. Cough Patient reports onset of cough yesterday. It is not productive. No fevers, chills, hemoptysis. No change in breathing   ROS: Per HPI  Allergies  Allergen Reactions  . Bactrim [Sulfamethoxazole-Trimethoprim]     Raw and irritatated in mouth.  Carlton Adam  [Propoxyphene N-Acetaminophen]   . Decongestant [Oxymetazoline]   . Elavil [Amitriptyline]   . Lithium Other (See Comments)    Hx toxicity  . Risperidone And Related   . Ultram [Tramadol]   . Cephalosporins Rash and Other (See Comments)    Keflex- Higher dose cause a rash  . Keflex [Cephalexin] Rash  . Omnicef [Cefdinir] Rash   Past Medical History:  Diagnosis Date  . Anemia   . Anxiety   . Bipolar 1 disorder ([place])   . Bipolar affective ([place])   . Depression   . Diabetes in pregnancy   . Diabetes mellitus   . Diabetes mellitus without complication ([place])   . Elevated LFTs   . GERD (gastroesophageal reflux disease)   . Hyperlipidemia   . Hypertension   . IBS (irritable bowel syndrome)   . Migraine     Current Outpatient Medications:  .  furosemide (LASIX) 20 MG tablet, TAKE 1 TABLET BY MOUTH EVERY DAY, Disp: 90 tablet, Rfl: 0 .  acetaminophen-codeine (TYLENOL #3) 300-30 MG tablet, Take 1 tablet by mouth every 6 (six) hours as needed for moderate pain., Disp: 30 tablet, Rfl: 0 .  albuterol (VENTOLIN HFA) 108 (90 Base) MCG/ACT inhaler, TAKE 2 PUFFS BY MOUTH EVERY 6 HOURS AS NEEDED FOR WHEEZE OR SHORTNESS OF BREATH, Disp: 6.7 each, Rfl: 0 .  ascorbic acid (VITAMIN C) 500 MG tablet, Take 500-1,000 mg by mouth daily., Disp: , Rfl:  .  cetirizine (ZYRTEC) 10 MG tablet, Take 1 tablet (10 mg total) by mouth daily., Disp: 30 tablet, Rfl: 11 .  cyclobenzaprine (FLEXERIL) 10 MG tablet, TAKE 0.5-1 TABLETS (5-10 MG TOTAL) BY MOUTH  2 (TWO) TIMES DAILY AS NEEDED FOR MUSCLE SPASMS., Disp: 60 tablet, Rfl: 2 .  donepezil (ARICEPT) 10 MG tablet, TAKE 1 TABLET BY MOUTH EVERY DAY, Disp: 90 tablet, Rfl: 0 .  DULoxetine (CYMBALTA) 60 MG capsule, TAKE 1 CAPSULE BY MOUTH EVERY DAY, Disp: 90 capsule, Rfl: 0 .  fluticasone (FLONASE) 50 MCG/ACT nasal spray, SPRAY 2 SPRAYS INTO EACH NOSTRIL EVERY DAY, Disp: 16 g, Rfl: 5 .  gabapentin (NEURONTIN) 300 MG capsule, TAKE 3 CAPSULES BY MOUTH 3 TIMES A DAY, Disp:  810 capsule, Rfl: 3 .  lamoTRIgine (LAMICTAL) 200 MG tablet, TAKE 1 TABLET BY MOUTH EVERY DAY, Disp: 90 tablet, Rfl: 1 .  magic mouthwash w/lidocaine SOLN, Gargle and spit 101m every 6 hours as needed for sore throat. 66mlidocaine, 6032mystatin, 44m9mdrocortisone tab, qs benadryl total 480mL43misp: 480 mL, Rfl: 0 .  meclizine (ANTIVERT) 25 MG tablet, TAKE 1 TABLET BY MOUTH 3 TIMES DAILY AS NEEDED FOR DIZZINESS., Disp: 30 tablet, Rfl: 0 .  Multiple Vitamin (MULTIVITAMIN) LIQD, Take 15 mLs by mouth daily., Disp: 473 mL, Rfl: 1 .  nystatin cream (MYCOSTATIN), Apply topically 2 (two) times daily., Disp: 60 g, Rfl: 3 .  pantoprazole (PROTONIX) 40 MG tablet, TAKE 2 TABLETS BY MOUTH EVERY DAY, Disp: 180 tablet, Rfl: 0 .  QUEtiapine (SEROQUEL) 200 MG tablet, TAKE 1 TABLET BY MOUTH EVERY DAY (Patient taking differently: 200 mg. In the middle of the night), Disp: 90 tablet, Rfl: 3 .  QUEtiapine (SEROQUEL) 400 MG tablet, TAKE 2 TABLETS (800 MG TOTAL) BY MOUTH AT BEDTIME., Disp: 180 tablet, Rfl: 2 .  rizatriptan (MAXALT) 10 MG tablet, Take 1 tablet (10 mg total) by mouth as needed. May repeat in 2 hours if needed, Disp: 10 tablet, Rfl: 5 .  Vitamin D, Ergocalciferol, (DRISDOL) 1.25 MG (50000 UT) CAPS capsule, Take 50,000 Units by mouth 2 (two) times a week. , Disp: , Rfl: 3  Assessment/ Plan: 69 y.68 female   Cough - Plan: benzonatate (TESSALON) 200 MG capsule, CBC with Differential/Platelet  Loss of consciousness (HCC) [place]: Ambulatory referral to Cardiology, CBC with Differential/Platelet  Non-intractable vomiting with nausea, unspecified vomiting type - Plan: Ambulatory referral to Gastroenterology, CMP14+EGFR, Bayer DCA Hb A1c Waived, CBC with Differential/Platelet  Thrush - Plan: nystatin (MYCOSTATIN) 100000 UNIT/ML suspension  Weight loss - Plan: CMP14+EGFR, Lipid panel, Bayer DCA Hb A1c Waived, Thyroid Panel With TSH, CBC with Differential/Platelet  Cough likely viral versus allergic in  nature. However, we discussed red flag signs and symptoms warranting further evaluation. She will continue her home antihistamines  I reviewed the recommendations by her neurosurgeon. I agree that if she is not had a cardiac work-up she certainly should have one given the LOC that she is sustained. I have placed a referral back to Dr. HochrPercival Spanishconsideration of Holter monitor and further evaluation of her LOC  With regards to her ongoing nausea and vomiting, [facility]. I suspect that much of her weight loss is due to decreased p.o. intake and decreased physical mobility. She will come in for labs at her earliest convenience.   Start time: 1:25pm End time: 1:41pm  Total time spent on patient care (including video visit/ documentation): 16 minutes  Erin Donaldson

## 2020-10-07 ENCOUNTER — Telehealth: Payer: Self-pay

## 2020-10-07 ENCOUNTER — Other Ambulatory Visit: Payer: Self-pay | Admitting: Family Medicine

## 2020-10-07 MED ORDER — AMOXICILLIN-POT CLAVULANATE 875-125 MG PO TABS: 1.0000 | ORAL_TABLET | Freq: Two times a day (BID) | ORAL | 0 refills | Status: DC

## 2020-10-07 NOTE — Telephone Encounter (Signed)
Patient states she will go to urgent care but she can not go today.  Requesting that an abx be sent in.

## 2020-10-07 NOTE — Telephone Encounter (Signed)
Patient aware and verbalizes understanding. 

## 2020-10-07 NOTE — Telephone Encounter (Signed)
Patient had a video visit with Dr. Reece Agar on 11/19 and was given tessalon pearls.  States that they are not helping and now she is worse.  C/O of worsening cough, fever and body aches.  States she had COVID in Jan and does not feel like she did when she had it in Jan.  Please advise

## 2020-10-07 NOTE — Telephone Encounter (Signed)
Her symptoms have progressed.  I recommend she be covid tested and possibly flu tested.  I think Urgent care is doing both.  I can send an antibiotic in but think she should certainly get checked.  If she has one of these viruses antibiotics will be totally not helpful.

## 2020-10-09 ENCOUNTER — Telehealth: Payer: Self-pay

## 2020-10-09 NOTE — Telephone Encounter (Signed)
Patient has done phone visit with you. Patient is no better o2 today 94. Daughter states she is getting worse SOB has done at home COVID test and it was neg. Please advise what you think next step should be o2 was 98 yesterday and has dropped to 94 today.

## 2020-10-09 NOTE — Telephone Encounter (Signed)
Patient daughter advised to go to urgent care again as stated.

## 2020-10-09 NOTE — Telephone Encounter (Signed)
Again, as previously stated, I think she needs to be checked out physically.  The antibiotic was given because she couldn't get in yesterday.  Dr Darlyn Read had a COVID slot open later in the day, if it is still open, she needs to come in to get examined.  Otherwise, UC.  Needds CXR

## 2020-10-11 ENCOUNTER — Emergency Department (HOSPITAL_COMMUNITY): Payer: Medicare Other

## 2020-10-11 ENCOUNTER — Other Ambulatory Visit: Payer: Self-pay

## 2020-10-11 ENCOUNTER — Emergency Department (HOSPITAL_COMMUNITY)
Admission: EM | Admit: 2020-10-11 | Discharge: 2020-10-11 | Disposition: A | Discharge status: Home / Self Care | Payer: Medicare Other | Attending: Emergency Medicine | Admitting: Emergency Medicine

## 2020-10-11 ENCOUNTER — Encounter (HOSPITAL_COMMUNITY): Payer: Self-pay

## 2020-10-11 DIAGNOSIS — E785 Hyperlipidemia, unspecified: Secondary | ICD-10-CM | POA: Insufficient documentation

## 2020-10-11 DIAGNOSIS — I10 Essential (primary) hypertension: Secondary | ICD-10-CM | POA: Diagnosis not present

## 2020-10-11 DIAGNOSIS — Z20822 Contact with and (suspected) exposure to covid-19: Secondary | ICD-10-CM | POA: Insufficient documentation

## 2020-10-11 DIAGNOSIS — R531 Weakness: Secondary | ICD-10-CM | POA: Diagnosis present

## 2020-10-11 DIAGNOSIS — E1169 Type 2 diabetes mellitus with other specified complication: Secondary | ICD-10-CM | POA: Insufficient documentation

## 2020-10-11 DIAGNOSIS — J181 Lobar pneumonia, unspecified organism: Secondary | ICD-10-CM | POA: Diagnosis not present

## 2020-10-11 DIAGNOSIS — Z79899 Other long term (current) drug therapy: Secondary | ICD-10-CM | POA: Diagnosis not present

## 2020-10-11 DIAGNOSIS — J189 Pneumonia, unspecified organism: Secondary | ICD-10-CM

## 2020-10-11 LAB — CBC WITH DIFFERENTIAL/PLATELET
Abs Immature Granulocytes: 0.05 10*3/uL (ref 0.00–0.07)
Basophils Absolute: 0 10*3/uL (ref 0.0–0.1)
Basophils Relative: 1 %
Eosinophils Absolute: 0 10*3/uL (ref 0.0–0.5)
Eosinophils Relative: 0 %
HCT: 42.5 % (ref 36.0–46.0)
Hemoglobin: 13.1 g/dL (ref 12.0–15.0)
Immature Granulocytes: 1 %
Lymphocytes Relative: 14 %
Lymphs Abs: 0.7 10*3/uL (ref 0.7–4.0)
MCH: 28.7 pg (ref 26.0–34.0)
MCHC: 30.8 g/dL (ref 30.0–36.0)
MCV: 93 fL (ref 80.0–100.0)
Monocytes Absolute: 0.5 10*3/uL (ref 0.1–1.0)
Monocytes Relative: 10 %
Neutro Abs: 3.9 10*3/uL (ref 1.7–7.7)
Neutrophils Relative %: 74 %
Platelets: 406 10*3/uL — ABNORMAL HIGH (ref 150–400)
RBC: 4.57 MIL/uL (ref 3.87–5.11)
RDW: 16.5 % — ABNORMAL HIGH (ref 11.5–15.5)
WBC: 5.2 10*3/uL (ref 4.0–10.5)
nRBC: 0 % (ref 0.0–0.2)

## 2020-10-11 LAB — URINALYSIS, ROUTINE W REFLEX MICROSCOPIC
Bacteria, UA: NONE SEEN
Bilirubin Urine: NEGATIVE
Glucose, UA: NEGATIVE mg/dL
Hgb urine dipstick: NEGATIVE
Ketones, ur: 20 mg/dL — AB
Leukocytes,Ua: NEGATIVE
Nitrite: NEGATIVE
Protein, ur: 30 mg/dL — AB
Specific Gravity, Urine: 1.023 (ref 1.005–1.030)
pH: 5 (ref 5.0–8.0)

## 2020-10-11 LAB — RESP PANEL BY RT-PCR (FLU A&B, COVID) ARPGX2
Influenza A by PCR: NEGATIVE
Influenza B by PCR: NEGATIVE
SARS Coronavirus 2 by RT PCR: NEGATIVE

## 2020-10-11 LAB — BASIC METABOLIC PANEL
Anion gap: 14 (ref 5–15)
BUN: 14 mg/dL (ref 8–23)
CO2: 18 mmol/L — ABNORMAL LOW (ref 22–32)
Calcium: 8.3 mg/dL — ABNORMAL LOW (ref 8.9–10.3)
Chloride: 105 mmol/L (ref 98–111)
Creatinine, Ser: 0.81 mg/dL (ref 0.44–1.00)
GFR, Estimated: >60 mL/min (ref >60)
Glucose, Bld: 124 mg/dL — ABNORMAL HIGH (ref 70–99)
Potassium: 4.2 mmol/L (ref 3.5–5.1)
Sodium: 137 mmol/L (ref 135–145)

## 2020-10-11 MED ORDER — SODIUM CHLORIDE 0.9 % IV BOLUS
1000.0000 mL | Freq: Once | INTRAVENOUS | Status: AC
  Administered 2020-10-11: 1000 mL via INTRAVENOUS

## 2020-10-11 MED ORDER — DOXYCYCLINE HYCLATE 100 MG PO TABS: 100.0000 mg | ORAL_TABLET | Freq: Once | ORAL | Status: DC

## 2020-10-11 MED ORDER — DOXYCYCLINE HYCLATE 100 MG PO TABS
100.0000 mg | ORAL_TABLET | Freq: Once | ORAL | Status: AC
  Administered 2020-10-11: 100 mg via ORAL
  Filled 2020-10-11: qty 1

## 2020-10-11 NOTE — Discharge Instructions (Addendum)
Erin Donaldson's symptoms may be caused by a virus, or an upper respiratory infection.  However we do see a possible pneumonia in the left lower side of her lungs.  I felt it was reasonable to treat her with 7 days of doxycycline.  She got her first dose in the ER today, will take her next dose tomorrow morning.  Please complete the full course of antibiotics.  Your should stop taking the amoxicillin prescribed before this.

## 2020-10-11 NOTE — ED Provider Notes (Signed)
[facility] EMERGENCY DEPARTMENT Provider Note   CSN: [id number] Arrival date & time: 10/11/20  1405     History Chief Complaint  Patient presents with  . Weakness    Erin Donaldson is a 69 y.o. female with a history of traumatic subdural hematoma status post burr hole drainage in August 2021  (Dr Franky Macho, NSGY), diabetes, bipolar 1 disorder, migraines, hypertension, hyperlipidemia, presenting to the emergency department generalized weakness.  The patient reports she had a very poor appetite for the past several days.  She denies nausea or vomiting, but says she does not feel like she wants to eat.  She reports she has had persistent cough for several days.  Her PCP started her empirically on amoxicillin for possible pneumonia, although she has not had x-rays performed yet.  She has taken 1 day of amoxicillin so far.  She continues to have coughing.  She denies fevers or chills.  She feels that she is not on enough fluids the past couple days, and her urine is dark and she feels fatigued.  She denies abdominal pain or diarrhea.  She denies any worsening headaches or vision changes.  He suffers from dizzy spells intermittently, and has had repeat CT scans,  She is fully vaccinated for Covid.  She did perform the home test which was negative at home yesterday.  HPI     Past Medical History:  Diagnosis Date  . Anemia   . Anxiety   . Bipolar 1 disorder (HCC)   . Bipolar affective (HCC)   . Depression   . Diabetes in pregnancy   . Diabetes mellitus   . Diabetes mellitus without complication (HCC)   . Elevated LFTs   . GERD (gastroesophageal reflux disease)   . Hyperlipidemia   . Hypertension   . IBS (irritable bowel syndrome)   . Migraine     Patient Active Problem List   Diagnosis Date Noted  . Scalp wound 08/09/2020  . Subdural hematoma, chronic (HCC) 07/24/2020  . Subdural hematoma (HCC) 07/23/2020  . Generalized edema 07/16/2020  . Hospital discharge follow-up  07/16/2020  . Acute subdural hematoma (HCC) 07/07/2020  . Acute pain of right shoulder 07/02/2020  . Rash due to allergy 07/02/2020  . AKI (acute kidney injury) (HCC) 06/20/2020  . Hypokalemia 06/20/2020  . Acute lower UTI 06/20/2020  . Vitamin D deficiency 10/03/2018  . Overweight (BMI 25.0-29.9) 04/26/2018  . Senile purpura (HCC) 04/04/2018  . Abnormal EKG 12/29/2017  . Iron deficiency anemia 01/24/2016  . Swelling of right lower extremity 07/15/2015  . Right hip pain 07/15/2015  . Microcytic anemia 07/15/2015  . Elevated LFTs   . History of gastric bypass 01/15/2014  . Amnestic disorder due to medical condition (HCC) 08/23/2013  . Diarrhea 07/19/2013  . Bipolar affective disorder in remission (HCC) 08/26/2009  . ANXIETY 08/26/2009  . Migraine headache 08/26/2009  . Hyperlipidemia associated with type 2 diabetes mellitus (HCC) 08/15/2009  . Hyperlipidemia 08/15/2009  . Essential hypertension 08/15/2009  . GERD 08/15/2009  . CONSTIPATION 08/15/2009  . IRRITABLE BOWEL SYNDROME 08/15/2009    Past Surgical History:  Procedure Laterality Date  . BURR HOLE [name] 07/24/2020   Procedure: Ines Bloomer hole for evacuation of chronic subdural with scalp laceration repair;  Surgeon: Coletta Memos, MD;  Location: [facility] OR;  Service: Neurosurgery;  Laterality: [name];  Burr hole for evacuation of chronic subdural with scalp laceration repair  . CHOLECYSTECTOMY    . GASTRIC BYPASS  2005  . SCALP LACERATION REPAIR [name]  07/24/2020   Procedure: SCALP LACERATION REPAIR;  Surgeon: Coletta Memos, MD;  Location: [facility] OR;  Service: Neurosurgery;  Laterality: [name];  SCALP LACERATION REPAIR     OB History   No obstetric history on file.     Family History  Problem Relation Age of Onset  . Kidney disease Father   . Heart disease Father 64       CABG  . Diabetes Father   . CAD Father   . Heart disease Mother 51       CAD  . Breast cancer Mother   . Cancer Mother        bladder  . Heart disease Maternal  Grandfather   . Heart attack Maternal Grandfather   . Stroke Maternal Grandfather   . Heart disease Brother   . Dementia Brother   . Heart disease Maternal Aunt   . Heart disease Maternal Uncle   . Heart disease Paternal Aunt   . Heart disease Paternal Uncle   . Diabetes Maternal Aunt   . Diabetes Maternal Uncle   . Diabetes Paternal Aunt   . Diabetes Paternal Uncle   . Stroke Maternal Grandmother   . Heart disease Paternal Grandfather   . Diabetes Daughter   . Drug abuse Daughter   . Depression Sister   . Hypertension Sister   . Alcohol abuse Brother   . Drug abuse Brother   . Other Brother        accident caused brain swelling   . Colon cancer Neg Hx     Social History   Tobacco Use  . Smoking status: Never Smoker  . Smokeless tobacco: Never Used  Vaping Use  . Vaping Use: Never used  Substance Use Topics  . Alcohol use: No    Alcohol/week: 0.0 standard drinks    Comment: rare  . Drug use: Yes    Types: [place]cal[place]    Home Medications Prior to Admission medications   Medication Sig Start Date End Date Taking? Authorizing Provider  albuterol (VENTOLIN HFA) 108 (90 Base) MCG/ACT inhaler TAKE 2 PUFFS BY MOUTH EVERY 6 HOURS AS NEEDED FOR WHEEZE OR SHORTNESS OF BREATH Patient taking differently: Inhale 2 puffs into the lungs every 6 (six) hours as needed.  09/10/20  Yes Delynn Flavin M, DO  ascorbic acid (VITAMIN C) 500 MG tablet Take 500-1,000 mg by mouth daily.   Yes [provider]  cetirizine (ZYRTEC) 10 MG tablet Take 1 tablet (10 mg total) by mouth daily. 02/12/20  Yes Rakes, Doralee Albino, FNP  cyclobenzaprine (FLEXERIL) 10 MG tablet TAKE 0.5-1 TABLETS (5-10 MG TOTAL) BY MOUTH 2 (TWO) TIMES DAILY AS NEEDED FOR MUSCLE SPASMS. 09/24/20  Yes Gottschalk, Ashly M, DO  donepezil (ARICEPT) 10 MG tablet TAKE 1 TABLET BY MOUTH EVERY DAY 08/19/20  Yes Gottschalk, Ashly M, DO  DULoxetine (CYMBALTA) 60 MG capsule TAKE 1 CAPSULE BY MOUTH EVERY DAY 08/05/20  Yes Cottle, Steva Ready., MD  fluticasone [facility]) 50 MCG/ACT nasal spray SPRAY 2 SPRAYS INTO EACH NOSTRIL EVERY DAY 02/12/20  Yes Rakes, Doralee Albino, FNP  furosemide (LASIX) 20 MG tablet TAKE 1 TABLET BY MOUTH EVERY DAY 09/02/20  Yes Gottschalk, Ashly M, DO  gabapentin (NEURONTIN) 300 MG capsule TAKE 3 CAPSULES BY MOUTH 3 TIMES A DAY Patient taking differently: Take 300 mg by mouth 3 (three) times daily.  09/17/20  Yes Gottschalk, Kathie Rhodes M, DO  lamoTRIgine (LAMICTAL) 200 MG tablet TAKE 1 TABLET BY MOUTH EVERY DAY 06/21/20  Yes  Cottle, Steva Readyarey G Jr., MD  meclizine (ANTIVERT) 25 MG tablet TAKE 1 TABLET BY MOUTH 3 TIMES DAILY AS NEEDED FOR DIZZINESS. 10/04/20  Yes Gottschalk, Ashly M, DO  nystatin (MYCOSTATIN) 100000 UNIT/ML suspension Take 5 mLs (500,000 Units total) by mouth 4 (four) times daily. x10-14 days for thrush 10/04/20  Yes Gottschalk, Ashly M, DO  pantoprazole (PROTONIX) 40 MG tablet TAKE 2 TABLETS BY MOUTH EVERY DAY 10/02/20  Yes Gottschalk, Ashly M, DO  QUEtiapine (SEROQUEL) 200 MG tablet TAKE 1 TABLET BY MOUTH EVERY DAY Patient taking differently: 200 mg. In the middle of the night 02/13/20  Yes Cottle, Steva Readyarey G Jr., MD  acetaminophen-codeine (TYLENOL #3) 300-30 MG tablet Take 1 tablet by mouth every 6 (six) hours as needed for moderate pain. Patient not taking: Reported on 10/11/2020 07/08/20   Coletta Memosabbell, Kyle, MD  benzonatate (TESSALON) 200 MG capsule Take 1 capsule (200 mg total) by mouth 3 (three) times daily as needed for cough. Patient not taking: Reported on 10/11/2020 10/04/20   Raliegh IpGottschalk, Ashly M, DO  Multiple Vitamin (MULTIVITAMIN) LIQD Take 15 mLs by mouth daily. Patient not taking: Reported on 10/11/2020 06/22/20   Cleora FleetJohnson, Clanford L, MD  nystatin cream (MYCOSTATIN) Apply topically 2 (two) times daily. Patient not taking: Reported on 10/11/2020 04/16/20   Jannifer RodneyHawks, Christy A, FNP  QUEtiapine (SEROQUEL) 400 MG tablet TAKE 2 TABLETS (800 MG TOTAL) BY MOUTH AT BEDTIME. Patient not taking: Reported on 10/11/2020  04/08/20   Lauraine Rinneottle, Carey G Jr., MD  rizatriptan (MAXALT) 10 MG tablet Take 1 tablet (10 mg total) by mouth as needed. May repeat in 2 hours if needed Patient not taking: Reported on 10/11/2020 11/03/18   Sonny Mastersakes, Linda M, FNP  Vitamin D, Ergocalciferol, (DRISDOL) 1.25 MG (50000 UT) CAPS capsule Take 50,000 Units by mouth 2 (two) times a week.  Patient not taking: Reported on 10/11/2020 08/27/18   [provider]    Allergies    Bactrim [sulfamethoxazole-trimethoprim], Darvocet [propoxyphene n-acetaminophen], Decongestant [oxymetazoline], Elavil [amitriptyline], Lithium, Risperidone and related, Ultram [tramadol], Cephalosporins, Keflex [cephalexin], and Omnicef [cefdinir]  Review of Systems   Review of Systems  Constitutional: Positive for appetite change and fatigue. Negative for chills and fever.  HENT: Negative for ear pain and sore throat.   Eyes: Negative for pain and visual disturbance.  Respiratory: Positive for cough. Negative for shortness of breath.   Cardiovascular: Negative for chest pain and palpitations.  Gastrointestinal: Negative for abdominal pain, nausea and vomiting.  Genitourinary: Negative for dysuria and hematuria.  Musculoskeletal: Negative for arthralgias and back pain.  Skin: Negative for color change and rash.  Neurological: Negative for syncope, numbness and headaches.  Psychiatric/Behavioral: Negative for agitation and confusion.  All other systems reviewed and are negative.   Physical Exam Updated Vital Signs BP 110/72 (BP Location: Right Arm)   Pulse (!) 112   Temp (!) 97.2 F (36.2 C) (Oral)   Resp 18   Ht 5\' 4"  (1.626 m)   Wt 56.5 kg   SpO2 98%   BMI 21.38 kg/m   Physical Exam Vitals and nursing note reviewed.  Constitutional:      General: She is not in acute distress.    Appearance: She is well-developed.     Comments: Appears tired  HENT:     Head: Normocephalic.      Comments: Occipital surgical scar well healed     Mouth/Throat:     Comments: Tachy mucous membranes Eyes:     Conjunctiva/sclera: Conjunctivae normal.  Cardiovascular:  Rate and Rhythm: Regular rhythm. Tachycardia present.     Pulses: Normal pulses.  Pulmonary:     Effort: Pulmonary effort is normal. No respiratory distress.     Breath sounds: Normal breath sounds.  Abdominal:     General: There is no distension.     Palpations: Abdomen is soft.     Tenderness: There is no abdominal tenderness.  Musculoskeletal:     Cervical back: Neck supple.  Skin:    General: Skin is warm and dry.  Neurological:     General: No focal deficit present.     Mental Status: She is alert and oriented to person, place, and time.     Sensory: No sensory deficit.     Motor: No weakness.  Psychiatric:        Mood and Affect: Mood normal.        Behavior: Behavior normal.     ED Results / Procedures / Treatments   Labs (all labs ordered are listed, but only abnormal results are displayed) Labs Reviewed  BASIC METABOLIC PANEL - Abnormal; Notable for the following components:      Result Value   CO2 18 (*)    Glucose, Bld 124 (*)    Calcium 8.3 (*)    All other components within normal limits  CBC WITH DIFFERENTIAL/PLATELET - Abnormal; Notable for the following components:   RDW 16.5 (*)    Platelets 406 (*)    All other components within normal limits  URINALYSIS, ROUTINE W REFLEX MICROSCOPIC - Abnormal; Notable for the following components:   Ketones, ur 20 (*)    Protein, ur 30 (*)    All other components within normal limits  RESP PANEL BY RT-PCR (FLU A&B, COVID) [id number]    EKG EKG Interpretation  Date/Time:  Friday October 11 2020 14:28:59 EST Ventricular Rate:  115 PR Interval:    QRS Duration: 82 QT Interval:  331 QTC Calculation: 458 R Axis:   118 Text Interpretation: Sinus tachycardia Right axis deviation Borderline T wave abnormalities No STEMI Confirmed by Alvester Chou [phone]) on 10/11/2020 7:11:11 PM    Radiology DG Chest 2 View  Result Date: 10/11/2020 CLINICAL DATA:  Cough. EXAM: CHEST - 2 VIEW COMPARISON:  06/20/2020 FINDINGS: The patient is rotated to the left with grossly unchanged cardiomediastinal silhouette. Mild prominence of the interstitial markings is similar to the prior study, and there is mild atelectasis or scarring in the left lung base. Allowing for patient rotation, no acute airspace consolidation, overt pulmonary edema, sizable pleural effusion, or pneumothorax is identified. No acute osseous abnormality is seen. IMPRESSION: Mild left basilar scarring or atelectasis. No definite evidence of pneumonia on this rotated examination. Electronically Signed   By: Sebastian Ache M.D.   On: 10/11/2020 17:02    Procedures Procedures (including critical care time)  Medications Ordered in ED Medications  sodium chloride 0.9 % bolus 1,000 mL (0 mLs Intravenous Stopped 10/11/20 1752)  doxycycline (VIBRA-TABS) tablet 100 mg (100 mg Oral Given 10/11/20 1918)    ED Course  I have reviewed the triage vital signs and the nursing notes.  Pertinent labs & imaging results that were available during my care of the patient were reviewed by me and considered in my medical decision making (see chart for details).  This patient complains of cough, poor appetite, fatigue  This involves an extensive number of treatment options, and is a complaint that carries with it a high risk of complications and morbidity.  The differential diagnosis  includes PNA vs anemia vs electrolyte derangement vs cystitis vs other  Covid/flu screen negative on arrival.  She is vaccinated for covid.  No concerning symptoms for worsening brain bleed or increasing ICH at this time.  No vomiting or worsening headache or vision changes.   I ordered, reviewed, and interpreted labs, which included BMP unremarkable, UA with prot and ketones, no leuks or nitrites, CBC with WBC 5.2, Hgb 13.1, I ordered medication IVF and  doxycycline for dehydration and PNA I ordered imaging studies which included dg chest I independently visualized and interpreted imaging which showed PNA vs atelectasis and the monitor tracing which showed sinus rhythm  Additional history was obtained from daughter by phone - who reports that the patient's grandson was sick with viral URI symptoms several days in the house before she was  ECG reviewed - no acute ischemia per my interpretation.  Doubt ACS.  After the interventions stated above, I reevaluated the patient and found her vitals were stable.  Suspect this may be viral URI but with productive cough and xray findings, we can treat with 7 days doxycycline for CAP.  Updated patient and daughter by phone.  Labs not consistent with significant dehydration.  Advised continued fluids at home, expect some improvement of symptoms over the next week.   Clinical Course as of Oct 12 105  Fri Oct 11, 2020  1710 IMPRESSION: Mild left basilar scarring or atelectasis. No definite evidence of pneumonia on this rotated examination.   [MT]    Clinical Course User Index [MT] Ronzell Laban, Kermit Balo, MD    Final Clinical Impression(s) / ED Diagnoses Final diagnoses:  Community acquired pneumonia of left lower lobe of lung    Rx / DC Orders ED Discharge Orders    None       Caytlyn Evers, Kermit Balo, MD 10/12/20 [phone]

## 2020-10-11 NOTE — ED Notes (Signed)
Patient discharged to home via wheelchair with caregiver.  All discharge instructions reviewed.  Patient able to demonstrate understanding via teachback method.  0 s/s acute distress.

## 2020-10-11 NOTE — ED Triage Notes (Addendum)
Pt brought in by [facility]. Reported that pt had a cough that began on the 18th and was given tessalon pearles. Pt sent to ED due to poor po intake and decrease urine output. Denies pain. Pt reports that she has had both of her covid and home tested on Tuesday with negative result

## 2020-10-18 ENCOUNTER — Ambulatory Visit: Payer: Medicare Other | Admitting: Gastroenterology

## 2020-10-21 ENCOUNTER — Other Ambulatory Visit: Payer: Self-pay | Admitting: Family Medicine

## 2020-10-21 ENCOUNTER — Other Ambulatory Visit: Payer: Self-pay | Admitting: Psychiatry

## 2020-10-21 DIAGNOSIS — M130 Polyarthritis, unspecified: Secondary | ICD-10-CM

## 2020-10-21 DIAGNOSIS — K219 Gastro-esophageal reflux disease without esophagitis: Secondary | ICD-10-CM

## 2020-10-22 ENCOUNTER — Other Ambulatory Visit: Payer: Self-pay | Admitting: Family Medicine

## 2020-10-22 DIAGNOSIS — U071 COVID-19: Secondary | ICD-10-CM

## 2020-10-22 DIAGNOSIS — J029 Acute pharyngitis, unspecified: Secondary | ICD-10-CM

## 2020-10-23 ENCOUNTER — Encounter: Payer: Self-pay | Admitting: Family Medicine

## 2020-10-23 ENCOUNTER — Ambulatory Visit: Payer: Medicare Other | Admitting: Gastroenterology

## 2020-10-23 ENCOUNTER — Ambulatory Visit (INDEPENDENT_AMBULATORY_CARE_PROVIDER_SITE_OTHER): Payer: Medicare Other | Admitting: Family Medicine

## 2020-10-23 ENCOUNTER — Other Ambulatory Visit: Payer: Self-pay

## 2020-10-23 VITALS — BP 92/64 | HR 114 | Temp 96.6°F | Ht 64.0 in | Wt 119.6 lb

## 2020-10-23 DIAGNOSIS — R634 Abnormal weight loss: Secondary | ICD-10-CM | POA: Diagnosis not present

## 2020-10-23 DIAGNOSIS — L89152 Pressure ulcer of sacral region, stage 2: Secondary | ICD-10-CM

## 2020-10-23 DIAGNOSIS — E440 Moderate protein-calorie malnutrition: Secondary | ICD-10-CM | POA: Diagnosis not present

## 2020-10-23 DIAGNOSIS — R110 Nausea: Secondary | ICD-10-CM

## 2020-10-23 DIAGNOSIS — B3789 Other sites of candidiasis: Secondary | ICD-10-CM

## 2020-10-23 DIAGNOSIS — I951 Orthostatic hypotension: Secondary | ICD-10-CM | POA: Diagnosis not present

## 2020-10-23 DIAGNOSIS — M62838 Other muscle spasm: Secondary | ICD-10-CM

## 2020-10-23 MED ORDER — NYSTATIN 100000 UNIT/GM EX CREA: TOPICAL_CREAM | Freq: Two times a day (BID) | CUTANEOUS | 3 refills | Status: AC

## 2020-10-23 MED ORDER — VITAMIN D (ERGOCALCIFEROL) 1.25 MG (50000 UNIT) PO CAPS: 50000.0000 [IU] | ORAL_CAPSULE | ORAL | 3 refills | Status: AC

## 2020-10-23 MED ORDER — BOOST HIGH PROTEIN PO LIQD: 1.0000 | Freq: Three times a day (TID) | ORAL | 99 refills | Status: AC

## 2020-10-23 NOTE — Progress Notes (Signed)
Subjective: CC: poor health PCP: Erin Ip, DO EHU:Erin Donaldson is a 69 y.o. female presenting to clinic today for:  1. Poor health Patient is accompanied today's visit by her daughter.  She notes that she has had ongoing weight loss since her event in August.  She continues to have nausea but has not had any vomiting.  Nausea is intermittent.  She has been trying to incorporate more protein and calories into her diet as she is quite weak.  She is being seen by physical therapy and occupational therapy 2 times per week but admits that she is only able to walk a few steps or stand for very short amount of time.  She in fact has had some neck spasm because of the way that she has been lying in her lift chair and has asked the physical therapist to aid her in this but they have required a new prescription in order to do that.  She reports some sacral pain that has been present for about a month now.  Her daughter has not appreciated any ulcerations but wanted [place] to take a look today.  She has purchased a donut pillow and is utilizing that.  Additionally, she was noted to be vitamin deficient but is not currently taking any vitamin replacement to her knowledge.  She has been intolerant to some of the meal replacement shakes but would like to try boost.  Typically eating a yogurt for breakfast sometimes she has lunch which today only consisted of 4 chicken livers.  She does feel that she is hydrating adequately and that the dizziness has improved quite a bit.  She has an appointment scheduled with cardiology but unfortunately missed her GI appointment today because she was worried she would not make it to my appointment this afternoon.   ROS: Per HPI  Allergies  Allergen Reactions  . Bactrim [Sulfamethoxazole-Trimethoprim]     Raw and irritatated in mouth.  Erin Donaldson [Propoxyphene N-Acetaminophen]   . Decongestant [Oxymetazoline]   . Erin Donaldson [Amitriptyline]   . Lithium Other (See  Comments)    Hx toxicity  . Risperidone And Related   . Ultram [Tramadol]   . Cephalosporins Rash and Other (See Comments)    Erin Donaldson- Higher dose cause a rash  . Erin Donaldson [Cephalexin] Rash  . Erin Donaldson [Cefdinir] Rash   Past Medical History:  Diagnosis Date  . Anemia   . Anxiety   . Bipolar 1 disorder (HCC)   . Bipolar affective (HCC)   . Depression   . Diabetes in pregnancy   . Diabetes mellitus   . Diabetes mellitus without complication (HCC)   . Elevated LFTs   . GERD (gastroesophageal reflux disease)   . Hyperlipidemia   . Hypertension   . IBS (irritable bowel syndrome)   . Migraine     Current Outpatient Medications:  .  acetaminophen-codeine (TYLENOL #3) 300-30 MG tablet, Take 1 tablet by mouth every 6 (six) hours as needed for moderate pain., Disp: 30 tablet, Rfl: 0 .  albuterol (VENTOLIN HFA) 108 (90 Base) MCG/ACT inhaler, TAKE 2 PUFFS BY MOUTH EVERY 6 HOURS AS NEEDED FOR WHEEZE OR SHORTNESS OF BREATH, Disp: 6.7 each, Rfl: 0 .  ascorbic acid (VITAMIN C) 500 MG tablet, Take 500-1,000 mg by mouth daily., Disp: , Rfl:  .  benzonatate (TESSALON) 200 MG capsule, Take 1 capsule (200 mg total) by mouth 3 (three) times daily as needed for cough., Disp: 20 capsule, Rfl: 0 .  cetirizine (ZYRTEC) 10  MG tablet, Take 1 tablet (10 mg total) by mouth daily., Disp: 30 tablet, Rfl: 11 .  cyclobenzaprine (FLEXERIL) 10 MG tablet, TAKE 0.5-1 TABLETS (5-10 MG TOTAL) BY MOUTH 2 (TWO) TIMES DAILY AS NEEDED FOR MUSCLE SPASMS., Disp: 60 tablet, Rfl: 2 .  donepezil (ARICEPT) 10 MG tablet, TAKE 1 TABLET BY MOUTH EVERY DAY, Disp: 90 tablet, Rfl: 0 .  DULoxetine (CYMBALTA) 60 MG capsule, TAKE 1 CAPSULE BY MOUTH EVERY DAY, Disp: 90 capsule, Rfl: 0 .  fluticasone (FLONASE) 50 MCG/ACT nasal spray, SPRAY 2 SPRAYS INTO EACH NOSTRIL EVERY DAY, Disp: 16 g, Rfl: 5 .  furosemide (LASIX) 20 MG tablet, TAKE 1 TABLET BY MOUTH EVERY DAY, Disp: 90 tablet, Rfl: 0 .  gabapentin (NEURONTIN) 300 MG capsule, TAKE 3  CAPSULES BY MOUTH 3 TIMES A DAY (Patient taking differently: Take 300 mg by mouth 3 (three) times daily. ), Disp: 810 capsule, Rfl: 3 .  lamoTRIgine (LAMICTAL) 200 MG tablet, TAKE 1 TABLET BY MOUTH EVERY DAY, Disp: 90 tablet, Rfl: 1 .  meclizine (ANTIVERT) 25 MG tablet, TAKE 1 TABLET BY MOUTH 3 TIMES DAILY AS NEEDED FOR DIZZINESS., Disp: 90 tablet, Rfl: 3 .  Multiple Vitamin (MULTIVITAMIN) LIQD, Take 15 mLs by mouth daily., Disp: 473 mL, Rfl: 1 .  nystatin (MYCOSTATIN) 100000 UNIT/ML suspension, Take 5 mLs (500,000 Units total) by mouth 4 (four) times daily. x10-14 days for thrush, Disp: 480 mL, Rfl: 0 .  nystatin cream (MYCOSTATIN), Apply topically 2 (two) times daily., Disp: 60 g, Rfl: 3 .  pantoprazole (PROTONIX) 40 MG tablet, TAKE 2 TABLETS BY MOUTH EVERY DAY, Disp: 180 tablet, Rfl: 0 .  QUEtiapine (SEROQUEL) 200 MG tablet, TAKE 1 TABLET BY MOUTH EVERY DAY (Patient taking differently: 200 mg. In the middle of the night), Disp: 90 tablet, Rfl: 3 .  QUEtiapine (SEROQUEL) 400 MG tablet, TAKE 2 TABLETS (800 MG TOTAL) BY MOUTH AT BEDTIME., Disp: 180 tablet, Rfl: 2 .  rizatriptan (MAXALT) 10 MG tablet, Take 1 tablet (10 mg total) by mouth as needed. May repeat in 2 hours if needed, Disp: 10 tablet, Rfl: 5 .  Vitamin D, Ergocalciferol, (DRISDOL) 1.25 MG (50000 UT) CAPS capsule, Take 50,000 Units by mouth 2 (two) times a week. , Disp: , Rfl: 3 Social History   Socioeconomic History  . Marital status: Widowed    Spouse name: Not on file  . Number of children: 1  . Years of education: college  . Highest education level: Not on file  Occupational History  . Occupation: disabled    Comment: [employer]   Tobacco Use  . Smoking status: Never Smoker  . Smokeless tobacco: Never Used  Vaping Use  . Vaping Use: Never used  Substance and Sexual Activity  . Alcohol use: No    Alcohol/week: 0.0 standard drinks    Comment: rare  . Drug use: Yes    Types: [place]  . Sexual activity: Not Currently  Other  Topics Concern  . Not on file  Social History Narrative   ** Merged History Encounter **       Ms. Erin Donaldson is a widow, her husband passed away in his sleep in 03/10/12. She currently has custody of her great grandson, who is 36 years old.  She actually delivered her great-grandson at home.   Social Determinants of Health   Financial Resource Strain:   . Difficulty of Paying Living Expenses: Not on file  Food Insecurity:   . Worried About Programme researcher, broadcasting/film/video in  the Last Year: Not on file  . Ran Out of Food in the Last Year: Not on file  Transportation Needs:   . Lack of Transportation (Medical): Not on file  . Lack of Transportation (Non-Medical): Not on file  Physical Activity:   . Days of Exercise per Week: Not on file  . Minutes of Exercise per Session: Not on file  Stress:   . Feeling of Stress : Not on file  Social Connections:   . Frequency of Communication with Friends and Family: Not on file  . Frequency of Social Gatherings with Friends and Family: Not on file  . Attends Religious Services: Not on file  . Active Member of Clubs or Organizations: Not on file  . Attends Banker Meetings: Not on file  . Marital Status: Not on file  Intimate Partner Violence:   . Fear of Current or Ex-Partner: Not on file  . Emotionally Abused: Not on file  . Physically Abused: Not on file  . Sexually Abused: Not on file   Family History  Problem Relation Age of Onset  . Kidney disease Father   . Heart disease Father 40       CABG  . Diabetes Father   . CAD Father   . Heart disease Mother 35       CAD  . Breast cancer Mother   . Cancer Mother        bladder  . Heart disease Maternal Grandfather   . Heart attack Maternal Grandfather   . Stroke Maternal Grandfather   . Heart disease Brother   . Dementia Brother   . Heart disease Maternal Aunt   . Heart disease Maternal Uncle   . Heart disease Paternal Aunt   . Heart disease Paternal Uncle   . Diabetes Maternal Aunt    . Diabetes Maternal Uncle   . Diabetes Paternal Aunt   . Diabetes Paternal Uncle   . Stroke Maternal Grandmother   . Heart disease Paternal Grandfather   . Diabetes Daughter   . Drug abuse Daughter   . Depression Sister   . Hypertension Sister   . Alcohol abuse Brother   . Drug abuse Brother   . Other Brother        accident caused brain swelling   . Colon cancer Neg Hx     Objective: Office vital signs reviewed. BP 92/64   Pulse (!) 114   Temp (!) 96.6 F (35.9 C)   Ht 5\' 4"  (1.626 m)   Wt 119 lb 9.6 oz (54.3 kg)   SpO2 99%   BMI 20.53 kg/m   Physical Examination:  General: Awake, alert, malnourished.  Ill-appearing., No acute distress HEENT: Normal; no temporal wasting appreciated but she does appear gaunt compared to previous eval Cardio: regular rate and rhythm, S1S2 heard, no murmurs appreciated Pulm: clear to auscultation bilaterally, no wheezes, rhonchi or rales; normal work of breathing on room air Sacral: She has some erythema noted along the gluteal folds and that there is a 2 mm shallow, sacral ulcer noted along the right upper inner gluteal fold.  No active drainage.  There is some tenderness.  There is some maceration of the skin noted suggestive of candidal infection MSK: Able to stand up with assistance.  She arrives today in wheelchair.  She has muscle wasting.  Assessment/ Plan: 69 y.o. female   Malnutrition of moderate degree (HCC) - Plan: feeding supplement (BOOST HIGH PROTEIN) LIQD, Wheelchair Cushion  Pressure injury of  sacral region, stage 2 (HCC) - Plan: Wheelchair Cushion  Orthostasis  Weight loss - Plan: feeding supplement (BOOST HIGH PROTEIN) LIQD  Nausea  Muscle spasm  Candida rash of groin  I have highly recommended that she start a feeding supplement.  She certainly has moderate malnutrition at minimum.  Boost has been prescribed.  A wheelchair cushion has been provided for her lift chair.  I agree with ongoing use of donut cushion  as needed as well.  I will get home health wound care/nurse to come out and continue to monitor and treat her sacral ulcer.  She is at high risk for progression and severe illness  Continue adequate hydration to avoid orthostasis.  Consider compression hose  I have recommended that she reschedule GI given ongoing weight loss, nausea.  Medical history significant for gastric bypass  Written prescription was provided to the patient today for neck spasm so that her PT may start treatment for that as well  No orders of the defined types were placed in this encounter.  Meds ordered this encounter  Medications  . Vitamin D, Ergocalciferol, (DRISDOL) 1.25 MG (50000 UNIT) CAPS capsule    Sig: Take 1 capsule (50,000 Units total) by mouth every 7 (seven) days.    Dispense:  12 capsule    Refill:  3  . nystatin cream (MYCOSTATIN)    Sig: Apply topically 2 (two) times daily.    Dispense:  60 g    Refill:  3  . feeding supplement (BOOST HIGH PROTEIN) LIQD    Sig: Take 237 mLs by mouth 3 (three) times daily between meals.    Dispense:  237 mL    Refill:  prn     Erin IpAshly M Alexandrea Westergard, DO [facility] [phone]

## 2020-10-23 NOTE — Patient Instructions (Signed)

## 2020-10-24 ENCOUNTER — Telehealth: Payer: Self-pay | Admitting: Family Medicine

## 2020-10-24 NOTE — Telephone Encounter (Signed)
Thank you.  She is trying to get more intake in to help with this.

## 2020-10-30 ENCOUNTER — Other Ambulatory Visit: Payer: Self-pay

## 2020-10-30 ENCOUNTER — Emergency Department (HOSPITAL_COMMUNITY): Payer: Medicare Other

## 2020-10-30 ENCOUNTER — Telehealth: Payer: Self-pay

## 2020-10-30 ENCOUNTER — Encounter (HOSPITAL_COMMUNITY): Payer: Self-pay | Admitting: Emergency Medicine

## 2020-10-30 ENCOUNTER — Emergency Department (HOSPITAL_COMMUNITY)
Admission: EM | Admit: 2020-10-30 | Discharge: 2020-10-30 | Disposition: A | Discharge status: Home / Self Care | Payer: Medicare Other | Attending: Emergency Medicine | Admitting: Emergency Medicine

## 2020-10-30 DIAGNOSIS — Z79899 Other long term (current) drug therapy: Secondary | ICD-10-CM | POA: Diagnosis not present

## 2020-10-30 DIAGNOSIS — D649 Anemia, unspecified: Secondary | ICD-10-CM | POA: Diagnosis not present

## 2020-10-30 DIAGNOSIS — R55 Syncope and collapse: Secondary | ICD-10-CM | POA: Diagnosis present

## 2020-10-30 DIAGNOSIS — Z7982 Long term (current) use of aspirin: Secondary | ICD-10-CM | POA: Diagnosis not present

## 2020-10-30 DIAGNOSIS — E119 Type 2 diabetes mellitus without complications: Secondary | ICD-10-CM | POA: Insufficient documentation

## 2020-10-30 DIAGNOSIS — I10 Essential (primary) hypertension: Secondary | ICD-10-CM | POA: Diagnosis not present

## 2020-10-30 DIAGNOSIS — I951 Orthostatic hypotension: Secondary | ICD-10-CM | POA: Insufficient documentation

## 2020-10-30 DIAGNOSIS — Z20822 Contact with and (suspected) exposure to covid-19: Secondary | ICD-10-CM | POA: Insufficient documentation

## 2020-10-30 LAB — CBC WITH DIFFERENTIAL/PLATELET
Abs Immature Granulocytes: 0.04 10*3/uL (ref 0.00–0.07)
Basophils Absolute: 0.1 10*3/uL (ref 0.0–0.1)
Basophils Relative: 1 %
Eosinophils Absolute: 0.1 10*3/uL (ref 0.0–0.5)
Eosinophils Relative: 2 %
HCT: 34.7 % — ABNORMAL LOW (ref 36.0–46.0)
Hemoglobin: 10.9 g/dL — ABNORMAL LOW (ref 12.0–15.0)
Immature Granulocytes: 1 %
Lymphocytes Relative: 21 %
Lymphs Abs: 1 10*3/uL (ref 0.7–4.0)
MCH: 29.7 pg (ref 26.0–34.0)
MCHC: 31.4 g/dL (ref 30.0–36.0)
MCV: 94.6 fL (ref 80.0–100.0)
Monocytes Absolute: 0.3 10*3/uL (ref 0.1–1.0)
Monocytes Relative: 7 %
Neutro Abs: 3.2 10*3/uL (ref 1.7–7.7)
Neutrophils Relative %: 68 %
Platelets: 253 10*3/uL (ref 150–400)
RBC: 3.67 MIL/uL — ABNORMAL LOW (ref 3.87–5.11)
RDW: 15.9 % — ABNORMAL HIGH (ref 11.5–15.5)
WBC: 4.7 10*3/uL (ref 4.0–10.5)
nRBC: 0 % (ref 0.0–0.2)

## 2020-10-30 LAB — BASIC METABOLIC PANEL
Anion gap: 7 (ref 5–15)
BUN: 18 mg/dL (ref 8–23)
CO2: 20 mmol/L — ABNORMAL LOW (ref 22–32)
Calcium: 8.2 mg/dL — ABNORMAL LOW (ref 8.9–10.3)
Chloride: 109 mmol/L (ref 98–111)
Creatinine, Ser: 0.87 mg/dL (ref 0.44–1.00)
GFR, Estimated: >60 mL/min (ref >60)
Glucose, Bld: 133 mg/dL — ABNORMAL HIGH (ref 70–99)
Potassium: 3.2 mmol/L — ABNORMAL LOW (ref 3.5–5.1)
Sodium: 136 mmol/L (ref 135–145)

## 2020-10-30 MED ORDER — SODIUM CHLORIDE 0.9 % IV BOLUS
500.0000 mL | Freq: Once | INTRAVENOUS | Status: AC
  Administered 2020-10-30: 500 mL via INTRAVENOUS

## 2020-10-30 MED ORDER — SODIUM CHLORIDE 0.9 % IV BOLUS
1000.0000 mL | Freq: Once | INTRAVENOUS | Status: AC
  Administered 2020-10-30: 1000 mL via INTRAVENOUS

## 2020-10-30 NOTE — Discharge Instructions (Addendum)
Your blood pressure today was very low when you stood up. This has improved with IV fluids. Recommend home to rest, continue IV fluids and recheck with your doctor in 2 days. Return to the ER for any worsening or concerning symptoms.

## 2020-10-30 NOTE — ED Provider Notes (Signed)
[facility] EMERGENCY DEPARTMENT Provider Note   CSN: [id number] Arrival date & time: 10/30/20  1402     History Chief Complaint  Patient presents with  . Loss of Consciousness    Erin Donaldson is a 69 y.o. female.  69 year old female with history of subdural hematoma requiring surgical intervention, discharged chronic HPI existed below, presents with report of syncope today.  Patient states that she was transitioning from her lift chair to the bedside commode when she felt dizzy and weak and passed out.  Patient states her daughter was there with her at the time and caught her, she did not fall, episode was brief lasting a few seconds, daughter reported to her that she had a blank stare throughout the episode.  Denies loss of bowel or bladder control or seizure-like activity.  Patient states that she has had problems with her blood pressure dropping when she stands causing her to feel dizzy.  Patient is feeling well at this time resting in the bed although continues to feel generally weak which is her baseline.  No other complaints or concerns today.        Past Medical History:  Diagnosis Date  . Anemia   . Anxiety   . Bipolar 1 disorder (HCC)   . Bipolar affective (HCC)   . Depression   . Diabetes in pregnancy   . Diabetes mellitus   . Diabetes mellitus without complication (HCC)   . Elevated LFTs   . GERD (gastroesophageal reflux disease)   . Hyperlipidemia   . Hypertension   . IBS (irritable bowel syndrome)   . Migraine     Patient Active Problem List   Diagnosis Date Noted  . Scalp wound 08/09/2020  . Subdural hematoma, chronic (HCC) 07/24/2020  . Subdural hematoma (HCC) 07/23/2020  . Generalized edema 07/16/2020  . Hospital discharge follow-up 07/16/2020  . Acute subdural hematoma (HCC) 07/07/2020  . Acute pain of right shoulder 07/02/2020  . Rash due to allergy 07/02/2020  . AKI (acute kidney injury) (HCC) 06/20/2020  . Hypokalemia 06/20/2020  . Acute  lower UTI 06/20/2020  . Vitamin D deficiency 10/03/2018  . Overweight (BMI 25.0-29.9) 04/26/2018  . Senile purpura (HCC) 04/04/2018  . Abnormal EKG 12/29/2017  . Iron deficiency anemia 01/24/2016  . Swelling of right lower extremity 07/15/2015  . Right hip pain 07/15/2015  . Microcytic anemia 07/15/2015  . Elevated LFTs   . History of gastric bypass 01/15/2014  . Amnestic disorder due to medical condition (HCC) 08/23/2013  . Diarrhea 07/19/2013  . Bipolar affective disorder in remission (HCC) 08/26/2009  . ANXIETY 08/26/2009  . Migraine headache 08/26/2009  . Hyperlipidemia associated with type 2 diabetes mellitus (HCC) 08/15/2009  . Hyperlipidemia 08/15/2009  . Essential hypertension 08/15/2009  . GERD 08/15/2009  . CONSTIPATION 08/15/2009  . IRRITABLE BOWEL SYNDROME 08/15/2009    Past Surgical History:  Procedure Laterality Date  . BURR HOLE [name] 07/24/2020   Procedure: Ines Bloomer hole for evacuation of chronic subdural with scalp laceration repair;  Surgeon: Coletta Memos, MD;  Location: [facility] OR;  Service: Neurosurgery;  Laterality: [name];  Burr hole for evacuation of chronic subdural with scalp laceration repair  . CHOLECYSTECTOMY    . GASTRIC BYPASS  2005  . SCALP LACERATION REPAIR [name] 07/24/2020   Procedure: SCALP LACERATION REPAIR;  Surgeon: Coletta Memos, MD;  Location: [facility] OR;  Service: Neurosurgery;  Laterality: [name];  SCALP LACERATION REPAIR     OB History   No obstetric history on file.  Family History  Problem Relation Age of Onset  . Kidney disease Father   . Heart disease Father [id number]       CABG  . Diabetes Father   . CAD Father   . Heart disease Mother [id number]       CAD  . Breast cancer Mother   . Cancer Mother        bladder  . Heart disease Maternal Grandfather   . Heart attack Maternal Grandfather   . Stroke Maternal Grandfather   . Heart disease Brother   . Dementia Brother   . Heart disease Maternal Aunt   . Heart disease Maternal Uncle   . Heart disease  Paternal Aunt   . Heart disease Paternal Uncle   . Diabetes Maternal Aunt   . Diabetes Maternal Uncle   . Diabetes Paternal Aunt   . Diabetes Paternal Uncle   . Stroke Maternal Grandmother   . Heart disease Paternal Grandfather   . Diabetes Daughter   . Drug abuse Daughter   . Depression Sister   . Hypertension Sister   . Alcohol abuse Brother   . Drug abuse Brother   . Other Brother        accident caused brain swelling   . Colon cancer Neg Hx     Social History   Tobacco Use  . Smoking status: Never Smoker  . Smokeless tobacco: Never Used  Vaping Use  . Vaping Use: Never used  Substance Use Topics  . Alcohol use: No    Alcohol/week: 0.0 standard drinks    Comment: rare  . Drug use: Yes    Types: Mescaline    Home Medications Prior to Admission medications   Medication Sig Start Date End Date Taking? Authorizing Provider  albuterol (VENTOLIN HFA) 108 (90 Base) MCG/ACT inhaler TAKE 2 PUFFS BY MOUTH EVERY 6 HOURS AS NEEDED FOR WHEEZE OR SHORTNESS OF BREATH Patient taking differently: Inhale 2 puffs into the lungs every 6 (six) hours as needed. 10/22/20  Yes Delynn FlavinGottschalk, Ashly M, DO  ascorbic acid (VITAMIN C) 500 MG tablet Take 500-1,000 mg by mouth daily.   Yes [provider]  Aspirin-Caffeine (BC FAST PAIN RELIEF PO) Take 1 tablet by mouth daily as needed.   Yes [provider]  cyclobenzaprine (FLEXERIL) 10 MG tablet TAKE 0.5-1 TABLETS (5-10 MG TOTAL) BY MOUTH 2 (TWO) TIMES DAILY AS NEEDED FOR MUSCLE SPASMS. 09/24/20  Yes Gottschalk, Ashly M, DO  donepezil (ARICEPT) 10 MG tablet TAKE 1 TABLET BY MOUTH EVERY DAY 10/21/20  Yes Gottschalk, Ashly M, DO  DULoxetine (CYMBALTA) 60 MG capsule TAKE 1 CAPSULE BY MOUTH EVERY DAY 10/22/20  Yes Cottle, Steva Readyarey G Jr., MD  fluticasone [facility](FLONASE) 50 MCG/ACT nasal spray SPRAY 2 SPRAYS INTO EACH NOSTRIL EVERY DAY 02/12/20  Yes Rakes, Doralee AlbinoLinda M, FNP  furosemide (LASIX) 20 MG tablet TAKE 1 TABLET BY MOUTH EVERY DAY Patient taking  differently: Take 20 mg by mouth every other day. 09/02/20  Yes Gottschalk, Ashly M, DO  gabapentin (NEURONTIN) 300 MG capsule TAKE 3 CAPSULES BY MOUTH 3 TIMES A DAY Patient taking differently: Take 300 mg by mouth at bedtime. 09/17/20  Yes Gottschalk, Kathie RhodesAshly M, DO  lamoTRIgine (LAMICTAL) 200 MG tablet TAKE 1 TABLET BY MOUTH EVERY DAY 06/21/20  Yes Cottle, Steva Readyarey G Jr., MD  meclizine (ANTIVERT) 25 MG tablet TAKE 1 TABLET BY MOUTH 3 TIMES DAILY AS NEEDED FOR DIZZINESS. 10/04/20  Yes Gottschalk, Ashly M, DO  pantoprazole (PROTONIX) 40 MG tablet TAKE  2 TABLETS BY MOUTH EVERY DAY 10/21/20  Yes Gottschalk, Ashly M, DO  QUEtiapine (SEROQUEL) 200 MG tablet TAKE 1 TABLET BY MOUTH EVERY DAY Patient taking differently: 200 mg. In the middle of the night 02/13/20  Yes Cottle, Steva Ready., MD  QUEtiapine (SEROQUEL) 400 MG tablet TAKE 2 TABLETS (800 MG TOTAL) BY MOUTH AT BEDTIME. 04/08/20  Yes Cottle, Steva Ready., MD  Vitamin D, Ergocalciferol, (DRISDOL) 1.25 MG (50000 UNIT) CAPS capsule Take 1 capsule (50,000 Units total) by mouth every 7 (seven) days. 10/23/20  Yes Gottschalk, Kathie Rhodes M, DO  acetaminophen-codeine (TYLENOL #3) 300-30 MG tablet Take 1 tablet by mouth every 6 (six) hours as needed for moderate pain. Patient not taking: Reported on 10/30/2020 07/08/20   Coletta Memos, MD  benzonatate (TESSALON) 200 MG capsule Take 1 capsule (200 mg total) by mouth 3 (three) times daily as needed for cough. Patient not taking: Reported on 10/30/2020 10/04/20   Raliegh Ip, DO  cetirizine (ZYRTEC) 10 MG tablet Take 1 tablet (10 mg total) by mouth daily. Patient not taking: Reported on 10/30/2020 02/12/20   Sonny Masters, FNP  feeding supplement (BOOST HIGH PROTEIN) LIQD Take 237 mLs by mouth 3 (three) times daily between meals. Patient not taking: No sig reported 10/23/20   Raliegh Ip, DO  Fructose-Dextrose-Phosphor Acd (NAUSEA RELIEF PO) Take 1 tablet by mouth daily as needed.    [provider]   Multiple Vitamin (MULTIVITAMIN) LIQD Take 15 mLs by mouth daily. Patient not taking: Reported on 10/30/2020 06/22/20   Standley Dakins L, MD  nystatin (MYCOSTATIN) 100000 UNIT/ML suspension Take 5 mLs (500,000 Units total) by mouth 4 (four) times daily. x10-14 days for thrush 10/04/20   Delynn Flavin M, DO  nystatin cream (MYCOSTATIN) Apply topically 2 (two) times daily. Patient not taking: No sig reported 10/23/20   Delynn Flavin M, DO  rizatriptan (MAXALT) 10 MG tablet Take 1 tablet (10 mg total) by mouth as needed. May repeat in 2 hours if needed 11/03/18   Rakes, Doralee Albino, FNP    Allergies    Bactrim [sulfamethoxazole-trimethoprim], Darvocet [propoxyphene n-acetaminophen], Decongestant [oxymetazoline], Elavil [amitriptyline], Lithium, Risperidone and related, Ultram [tramadol], Cephalosporins, Keflex [cephalexin], and Omnicef [cefdinir]  Review of Systems   Review of Systems  Constitutional: Negative for chills and fever.  Eyes: Negative for visual disturbance.  Respiratory: Negative for shortness of breath.   Cardiovascular: Negative for chest pain.  Gastrointestinal: Negative for nausea and vomiting.  Genitourinary: Negative for dysuria and frequency.  Musculoskeletal: Negative for arthralgias and myalgias.  Skin: Negative for rash and wound.  Allergic/Immunologic: Negative for immunocompromised state.  Neurological: Positive for dizziness, syncope, weakness and light-headedness. Negative for speech difficulty, numbness and headaches.  Psychiatric/Behavioral: Negative for confusion.  All other systems reviewed and are negative.   Physical Exam Updated Vital Signs BP 136/77 (BP Location: Left Arm)   Pulse 96   Temp 98.4 F (36.9 C) (Oral)   Resp 18   Ht 5\' 4"  (1.626 m)   Wt 49.4 kg   SpO2 98%   BMI 18.71 kg/m   Physical Exam Vitals and nursing note reviewed.  Constitutional:      General: She is not in acute distress.    Appearance: She is well-developed and  well-nourished. She is not diaphoretic.  HENT:     Head: Normocephalic and atraumatic.     Nose: Nose normal.     Mouth/Throat:     Mouth: Mucous membranes are moist.  Eyes:  Extraocular Movements: Extraocular movements intact.     Pupils: Pupils are equal, round, and reactive to light.  Cardiovascular:     Rate and Rhythm: Normal rate and regular rhythm.     Pulses: Normal pulses.     Heart sounds: Normal heart sounds.  Pulmonary:     Effort: Pulmonary effort is normal.     Breath sounds: Normal breath sounds.  Abdominal:     Palpations: Abdomen is soft.     Tenderness: There is no abdominal tenderness.  Genitourinary:    Rectum: Guaiac result negative.     Comments: Stool soft and brown, hemoccult negative Musculoskeletal:        General: No tenderness or signs of injury.     Cervical back: Neck supple.     Right lower leg: No edema.     Left lower leg: No edema.  Skin:    General: Skin is warm and dry.     Findings: No erythema or rash.  Neurological:     Mental Status: She is alert and oriented to person, place, and time.     Cranial Nerves: No cranial nerve deficit.     Sensory: No sensory deficit.     Motor: No weakness.  Psychiatric:        Mood and Affect: Mood and affect normal.        Behavior: Behavior normal.     ED Results / Procedures / Treatments   Labs (all labs ordered are listed, but only abnormal results are displayed) Labs Reviewed  BASIC METABOLIC PANEL - Abnormal; Notable for the following components:      Result Value   Potassium 3.2 (*)    CO2 20 (*)    Glucose, Bld 133 (*)    Calcium 8.2 (*)    All other components within normal limits  CBC WITH DIFFERENTIAL/PLATELET - Abnormal; Notable for the following components:   RBC 3.67 (*)    Hemoglobin 10.9 (*)    HCT 34.7 (*)    RDW 15.9 (*)    All other components within normal limits  RESP PANEL BY RT-PCR (FLU A&B, COVID) [id number]  URINALYSIS, ROUTINE W REFLEX MICROSCOPIC  POC OCCULT  BLOOD, ED    EKG EKG Interpretation  Date/Time:  Wednesday October 30 2020 14:12:02 EST Ventricular Rate:  98 PR Interval:    QRS Duration: 85 QT Interval:  371 QTC Calculation: 474 R Axis:   -43 Text Interpretation: Sinus rhythm Left axis deviation Low voltage, extremity leads No STEMI Confirmed by Alona Bene [id number]) on 10/30/2020 4:46:06 PM   Radiology CT Head Wo Contrast  Result Date: 10/30/2020 CLINICAL DATA:  69 year old female with syncope. EXAM: CT HEAD WITHOUT CONTRAST TECHNIQUE: Contiguous axial images were obtained from the base of the skull through the vertex without intravenous contrast. COMPARISON:  Head CT dated 07/27/2020. FINDINGS: Brain: There is mild age-related atrophy and chronic microvascular ischemic changes. Left frontal linear hypodensity, likely an old infarct. Interval resolution of the previously seen left hemispheric subdural hematoma. There is no acute intracranial hemorrhage. No mass effect midline shift. No extra-axial fluid collection. Vascular: No hyperdense vessel or unexpected calcification. Skull: Normal. Negative for fracture or focal lesion. Sinuses/Orbits: No acute finding. Other: None IMPRESSION: 1. No acute intracranial pathology. 2. Mild age-related atrophy and chronic microvascular ischemic changes. Old left frontal infarct. Electronically Signed   By: Elgie Collard M.D.   On: 10/30/2020 16:25    Procedures Procedures (including critical care time)  Medications Ordered in ED  Medications  sodium chloride 0.9 % bolus 500 mL (500 mLs Intravenous New Bag/Given 10/30/20 1719)  sodium chloride 0.9 % bolus 1,000 mL (1,000 mLs Intravenous New Bag/Given 10/30/20 1730)    ED Course  I have reviewed the triage vital signs and the nursing notes.  Pertinent labs & imaging results that were available during my care of the patient were reviewed by me and considered in my medical decision making (see chart for details).  Clinical Course as of  10/30/20 Margretta Ditty  Wed Oct 30, 2020  [id number] 69 year old female brought in by EMS from home with syncopal episode today. Patient states that she used her lift chair to stand up today, felt dizzy and passed out, her daughter caught her and there was no fall or injury. Episode lasted a few seconds. Patient states that this is a recurring problem due to her drop in blood pressure. Exam is unremarkable. Review of labs, patient is found to have a hemoglobin of 10.9, this is decreased from 13 last month, Hemoccult is negative. BMP with mild hypokalemia otherwise no significant changes. CT head unremarkable, ordered due to syncope with recent history of subdural hemorrhage. EKG without acute ischemic changes. Orthostatic vital signs show a drop of 70 points in patient's systolic pressure, she is symptomatic but did not have any further syncopal events. Patient was given 1500 mL bolus, on repeat still has a 40 point drop in her systolic pressure from the 130s to the 90s however states that she is feeling much better and is no longer symptomatic. Patient would like to go home at this time, agrees to continue to hydrate and recheck with her doctor. Plan is to return to ER for any new concerning symptoms. [LM]    Clinical Course User Index [LM] Alden Hipp   MDM Rules/Calculators/A&P                          Final Clinical Impression(s) / ED Diagnoses Final diagnoses:  Orthostatic hypotension  Syncope and collapse  Anemia, unspecified type    Rx / DC Orders ED Discharge Orders    None       Alden Hipp 10/30/20 Mariana Kaufman, MD 11/05/20 0900

## 2020-10-30 NOTE — Progress Notes (Signed)
Cardiology Office Note:    Date:  11/01/2020   ID:  TOINI FAILLA, DOB 09/27/1951, MRN [id number]  PCP:  Janora Norlander, DO  [place] Cardiologist:  No primary care provider on file.  [facility] Electrophysiologist:  None   Referring MD: Janora Norlander, DO    History of Present Illness:    Erin Donaldson is a 69 y.o. female with a hx of subdural hematoma, HTN, HLD, DMII, obesity s/p gastric bypass, anxiety, bipolar disease, and migraines who was referred by Dr. Lajuana Ripple for further evaluation of syncope.  Patient was seen in [facility] ED on 12/15 for syncope. Specifically, the patient states that she was transitioning from her lift chair to the bedside commode when she felt dizzy and weak and passed out.  Fortunately, her daughter was there with her at the time and caught her. Episode was brief lasting a few seconds, daughter reported to her that she had a blank stare throughout the episode. Denies loss of bowel or bladder control or seizure-like activity.  Patient states that she has had problems with her blood pressure dropping when she stands causing her to feel dizzy.   In the ED, patient was notably orthostaic with drop in systolic blood pressure by 40mHg with standing. Otherwise, CT head with no acute pathology ECG with poor r-wave progression, which is unchanged from prior but no ischemia. She was given 1500cc IVF.   The patient last saw Dr. HPercival Spanishin 2019 for abnormal ECG. She was not symptomatic at that time, but given strong family history and known nonobstructive CAD in the past, she was recommended for exercise stress. She was unable to get on the treadmill due to knee pain and did not proceed with the myoview.  Of note, the patient has been followed by her primary care for ongoing malnutrition and weight loss. Has been recommended for GI evaluation. Also anemic with hemogblobin 10.6.  Today, the patient states that she feels much better. Her daughter states  that she has been having fainting spells after her gastric bypass surgery in 2005. They stopped for a period of time but then recurred after she underwent evacuation of her subdural hematoma 07/23/2020. When she had syncope recently at home, BP on arrival of EMS was 60/40s. Normal blood pressure for her is 100s. Denies any current dizziness, chest pain, SOB, nausea, vomiting. Continues to be malnourished with poor PO intake but she is trying to increase her dietary intake. She is not on any antihypertensives but takes a small dose of lasix 112mintermittently for LE edema. Has not done so in several weeks. She does not believe she has had an ultrasound of her heart or a monitor for work-up of her syncope in the past. Cath in 2003 with 30% RCA disease.   Father with massive CAD s/p ACB with MI, mother with ACB.   Past Medical History:  Diagnosis Date  . Anemia   . Anxiety   . Bipolar 1 disorder (HCGravity  . Bipolar affective ([facility]  . Depression   . Diabetes in pregnancy   . Diabetes mellitus   . Diabetes mellitus without complication ([place]  . Elevated LFTs   . GERD (gastroesophageal reflux disease)   . Hyperlipidemia   . Hypertension   . IBS (irritable bowel syndrome)   . Migraine     Past Surgical History:  Procedure Laterality Date  . BURR HOLE [name] 07/24/2020   Procedure: BuTrudee Kusterole for evacuation of chronic  subdural with scalp laceration repair;  Surgeon: Ashok Pall, MD;  Location: [place];  Service: Neurosurgery;  Laterality: [name];  Burr hole for evacuation of chronic subdural with scalp laceration repair  . CHOLECYSTECTOMY    . GASTRIC BYPASS  2005  . SCALP LACERATION REPAIR [name] 07/24/2020   Procedure: SCALP LACERATION REPAIR;  Surgeon: Ashok Pall, MD;  Location: [place];  Service: Neurosurgery;  Laterality: [name];  SCALP LACERATION REPAIR    Current Medications: Current Meds  Medication Sig  . acetaminophen-codeine (TYLENOL #3) 300-30 MG tablet Take 1 tablet by mouth every 6 (six)  hours as needed for moderate pain.  Marland Kitchen albuterol (VENTOLIN HFA) 108 (90 Base) MCG/ACT inhaler TAKE 2 PUFFS BY MOUTH EVERY 6 HOURS AS NEEDED FOR WHEEZE OR SHORTNESS OF BREATH (Patient taking differently: Inhale 2 puffs into the lungs every 6 (six) hours as needed.)  . ascorbic acid (VITAMIN C) 500 MG tablet Take 500-1,000 mg by mouth daily.  . Aspirin-Caffeine (BC FAST PAIN RELIEF PO) Take 1 tablet by mouth daily as needed.  . cyclobenzaprine (FLEXERIL) 10 MG tablet TAKE 0.5-1 TABLETS (5-10 MG TOTAL) BY MOUTH 2 (TWO) TIMES DAILY AS NEEDED FOR MUSCLE SPASMS.  Marland Kitchen donepezil (ARICEPT) 10 MG tablet TAKE 1 TABLET BY MOUTH EVERY DAY  . DULoxetine (CYMBALTA) 60 MG capsule TAKE 1 CAPSULE BY MOUTH EVERY DAY  . feeding supplement (BOOST HIGH PROTEIN) LIQD Take 237 mLs by mouth 3 (three) times daily between meals.  . fluticasone (FLONASE) 50 MCG/ACT nasal spray SPRAY 2 SPRAYS INTO EACH NOSTRIL EVERY DAY  . Fructose-Dextrose-Phosphor Acd (NAUSEA RELIEF PO) Take 1 tablet by mouth daily as needed.  . gabapentin (NEURONTIN) 300 MG capsule TAKE 3 CAPSULES BY MOUTH 3 TIMES A DAY (Patient taking differently: Take 300 mg by mouth at bedtime.)  . lamoTRIgine (LAMICTAL) 200 MG tablet TAKE 1 TABLET BY MOUTH EVERY DAY  . meclizine (ANTIVERT) 25 MG tablet TAKE 1 TABLET BY MOUTH 3 TIMES DAILY AS NEEDED FOR DIZZINESS.  . Multiple Vitamin (MULTIVITAMIN) LIQD Take 15 mLs by mouth daily.  Marland Kitchen nystatin (MYCOSTATIN) 100000 UNIT/ML suspension Take 5 mLs (500,000 Units total) by mouth 4 (four) times daily. x10-14 days for thrush  . nystatin cream (MYCOSTATIN) Apply topically 2 (two) times daily.  . pantoprazole (PROTONIX) 40 MG tablet TAKE 2 TABLETS BY MOUTH EVERY DAY  . QUEtiapine (SEROQUEL) 200 MG tablet TAKE 1 TABLET BY MOUTH EVERY DAY (Patient taking differently: 200 mg. In the middle of the night)  . QUEtiapine (SEROQUEL) 400 MG tablet TAKE 2 TABLETS (800 MG TOTAL) BY MOUTH AT BEDTIME.  Marland Kitchen Vitamin D, Ergocalciferol, (DRISDOL) 1.25  MG (50000 UNIT) CAPS capsule Take 1 capsule (50,000 Units total) by mouth every 7 (seven) days.  . [DISCONTINUED] furosemide (LASIX) 20 MG tablet TAKE 1 TABLET BY MOUTH EVERY DAY (Patient taking differently: Take 20 mg by mouth every other day.)     Allergies:   Bactrim [sulfamethoxazole-trimethoprim], Darvocet [propoxyphene n-acetaminophen], Decongestant [oxymetazoline], Elavil [amitriptyline], Lithium, Risperidone and related, Ultram [tramadol], Cephalosporins, Keflex [cephalexin], and Omnicef [cefdinir]   Social History   Socioeconomic History  . Marital status: Widowed    Spouse name: Not on file  . Number of children: 1  . Years of education: college  . Highest education level: Not on file  Occupational History  . Occupation: disabled    Comment: [employer]   Tobacco Use  . Smoking status: Never Smoker  . Smokeless tobacco: Never Used  Vaping Use  . Vaping Use: Never used  Substance and Sexual Activity  . Alcohol use: No    Alcohol/week: 0.0 standard drinks    Comment: rare  . Drug use: Yes    Types: [place]  . Sexual activity: Not Currently  Other Topics Concern  . Not on file  Social History Narrative   ** Merged History Encounter **       Ms. Gibby is a widow, her husband passed away in his sleep in 2012/02/15. She currently has custody of her great grandson, who is 42 years old.  She actually delivered her great-grandson at home.   Social Determinants of Health   Financial Resource Strain: Not on file  Food Insecurity: Not on file  Transportation Needs: Not on file  Physical Activity: Not on file  Stress: Not on file  Social Connections: Not on file     Family History: The patient's family history includes Alcohol abuse in her brother; Breast cancer in her mother; CAD in her father; Cancer in her mother; Dementia in her brother; Depression in her sister; Diabetes in her daughter, father, maternal aunt, maternal uncle, paternal aunt, and paternal uncle; Drug abuse in  her brother and daughter; Heart attack in her maternal grandfather; Heart disease in her brother, maternal aunt, maternal grandfather, maternal uncle, paternal aunt, paternal grandfather, and paternal uncle; Heart disease (age of onset: 53) in her mother; Heart disease (age of onset: 64) in her father; Hypertension in her sister; Kidney disease in her father; Other in her brother; Stroke in her maternal grandfather and maternal grandmother. There is no history of Colon cancer.  ROS:   Please see the history of present illness.    Review of Systems  Constitutional: Positive for malaise/fatigue and weight loss. Negative for chills and fever.  HENT: Negative for congestion.   Eyes: Negative for blurred vision.  Respiratory: Negative for shortness of breath.   Cardiovascular: Positive for leg swelling. Negative for chest pain, palpitations, orthopnea, claudication and PND.  Gastrointestinal: Negative for blood in stool, heartburn, nausea and vomiting.  Genitourinary: Negative for hematuria.  Musculoskeletal: Positive for falls.  Neurological: Positive for dizziness and loss of consciousness.  Endo/Heme/Allergies: Bruises/bleeds easily.  Psychiatric/Behavioral: The patient is nervous/anxious.     EKGs/Labs/Other Studies Reviewed:    The following studies were reviewed today: CT head 10/30/20: FINDINGS: Brain: There is mild age-related atrophy and chronic microvascular ischemic changes. Left frontal linear hypodensity, likely an old infarct. Interval resolution of the previously seen left hemispheric subdural hematoma. There is no acute intracranial hemorrhage. No mass effect midline shift. No extra-axial fluid collection.  Vascular: No hyperdense vessel or unexpected calcification.  Skull: Normal. Negative for fracture or focal lesion.  Sinuses/Orbits: No acute finding.  Other: None  IMPRESSION: 1. No acute intracranial pathology. 2. Mild age-related atrophy and chronic  microvascular ischemic changes. Old left frontal infarct.  Cath 02/14/02: RCA 30%; otherwise no significant CAD  EKG:  EKG is 10/31/20: NSR, poor r-wave progression  ECG 11/01/20: Sinus tachycardic, frequent PACs, low voltage in precordial and limb leads, LAD, poor r-wave progression   Recent Labs: 06/20/2020: TSH 1.199 06/26/2020: BNP 31.7 07/07/2020: ALT 24; Magnesium 1.6 10/30/2020: BUN 18; Creatinine, Ser 0.87; Hemoglobin 10.9; Platelets 253; Potassium 3.2; Sodium 136  Recent Lipid Panel    Component Value Date/Time   CHOL 160 01/10/2019 1212   CHOL 195 04/17/2013 1135   TRIG 68 01/10/2019 1212   TRIG 66 01/17/2014 1050   TRIG 62 04/17/2013 1135   HDL 63 01/10/2019 1212   HDL  85 01/17/2014 1050   HDL 88 04/17/2013 1135   CHOLHDL 2.5 01/10/2019 1212   LDLCALC 83 01/10/2019 1212   LDLCALC 78 01/17/2014 1050   LDLCALC 95 04/17/2013 1135      Physical Exam:    VS:  BP (!) 80/62   Pulse (!) 107   Ht '5\' 4"'  (1.626 m)   Wt 123 lb (55.8 kg)   SpO2 (!) 80%   BMI 21.11 kg/m     Wt Readings from Last 3 Encounters:  11/01/20 123 lb (55.8 kg)  10/30/20 109 lb (49.4 kg)  10/23/20 119 lb 9.6 oz (54.3 kg)     GEN:  Comfortable, malnourished HEENT: Normal NECK: No JVD; No carotid bruits CARDIAC: RRR, no murmurs, rubs, gallops RESPIRATORY:  Clear to auscultation without rales, wheezing or rhonchi  ABDOMEN: Thin, soft, non-tender, non-distended MUSCULOSKELETAL:  No edema; No deformity.  SKIN: Warm and dry NEUROLOGIC:  Alert and oriented x 3 PSYCHIATRIC:  Normal affect   ASSESSMENT:    1. Syncope with risk for coronary artery disease greater than 20% in next 10 years   2. Hypokalemia   3. Orthostatic hypotension   4. Abnormal EKG   5. Subdural hematoma (HCC)   6. Malnutrition, unspecified type ([place])    PLAN:    In order of problems listed above:  #Syncope #Orthostatic Hypotension: Patient with episode of syncope when transitioning from the chair to the commode found  to have significant orthostasis in the ED with drop SBP 47mHg. Has ongoing hypotension at home in the setting of significant malnutrition and poor PO intake. Not on any antihypertensives. Has been taking lasix very rarely. Patient reportedly with syncope since 2005 when she had her gastric bypass which initially improved but now has recurred. Suspect symptoms are driven by orthostasis in the setting of malnourishment, however, given that patient has very strong family history of CAD, abnormal ECG and was recommended for a stress test in the past, and evidence of very frequent PACs on ecg, will proceed with a more thorough work-up. -Discussed at length the importance of adequate fluid and PO intake as likely the primary driver of syncope is orthostatic hypotension -Stop lasix even if only taking rarely as this will exacerbate symptoms -Will check 14day zio monitor given significant ectopy noted on ECG -Check TTE -Check coronary CTA as patient with abnormal ECG and very strong family history of CAD -Will start midodrine 557mTID for now as currently hypotensive with SBPs 80s in clinic to help maintain blood pressure--hopefully can wean off with improved PO intake -Agree with GI assessment given malnourishment, anemia and poor PO intake -Follow-up in clinic in 6weeks  #Abnormal ECG #Family history CAD Patient with strong family history of CAD with father with history of MI/ACB and mother with ACB. Has poor r-wave progression on ECG for which she was seen by Dr. HoPercival Spanishn the past. Was recommended for stress but was lost to follow-up. Will proceed with coronary CTA now. -Check coronary CTA--will slow HR with ivabradine as will not tolerate metop due to hypotension  #Prior frontal CVA on CT head: -Start statin -Hold ASA given frequent falls and recent subdural hematoma  #Subdrual hematoma: S/p evacuation on 08/02/2020. Stable  #Malnutrition #Anemia: Patient profoundly malnourished with poor PO  intake. States food tastes salty and she has difficulty eating as she feels like "food gets lodged in her esophageus." Has been seen by PCP who recommended nutrition supplements as well as GI evaluation.  -Agree with GI evaluation  Medication Adjustments/Labs and Tests Ordered: Current medicines are reviewed at length with the patient today.  Concerns regarding medicines are outlined above.  Orders Placed This Encounter  Procedures  . CT CORONARY MORPH W/CTA COR W/SCORE W/CA W/CM &/OR WO/CM  . CT CORONARY FRACTIONAL FLOW RESERVE DATA PREP  . CT CORONARY FRACTIONAL FLOW RESERVE FLUID ANALYSIS  . Basic metabolic panel  . LONG TERM MONITOR (3-14 DAYS)  . EKG 12-Lead  . ECHOCARDIOGRAM COMPLETE   Meds ordered this encounter  Medications  . rosuvastatin (CRESTOR) 5 MG tablet    Sig: Take 1 tablet (5 mg total) by mouth daily.    Dispense:  90 tablet    Refill:  3  . midodrine (PROAMATINE) 5 MG tablet    Sig: Take 1 tablet (5 mg total) by mouth 3 (three) times daily with meals.    Dispense:  270 tablet    Refill:  3  . ivabradine (CORLANOR) 5 MG TABS tablet    Sig: Take 2 tablets (10 mg total) by mouth once for 1 dose. Take one hour prior to cardiac CT    Dispense:  2 tablet    Refill:  0    Patient Instructions  Medication Instructions:  Your physician has recommended you make the following change in your medication:   Begin:  Stop Lasix  Midodrine, 26m tablet, three times per day for low blood pressure. Please monitor your blood pressure once daily for 7 days. If your blood pressure remains less than 90/65 or greater than 140/90, please call the office to let uKoreaknow.  Crestor 587mtablet, once before bed each night  Labwork:  Your physician recommends that you return for lab work in: A few days before your Cardiac CT- BMP   Testing/Procedures:  Your physician has requested that you have an echocardiogram. Echocardiography is a painless test that uses sound waves to  create images of your heart. It provides your doctor with information about the size and shape of your heart and how well your heart's chambers and valves are working. This procedure takes approximately one hour. There are no restrictions for this procedure.  ZIO XT- Long Term Monitor Instructions   Your physician has requested you wear your ZIO patch monitor 14 days.   This is a single patch monitor.  [employer] supplies one patch monitor per enrollment.  Additional stickers are not available.   Please do not apply patch if you will be having a Nuclear Stress Test, Echocardiogram, Cardiac CT, MRI, or Chest Xray during the time frame you would be wearing the monitor. The patch cannot be worn during these tests.  You cannot remove and re-apply the ZIO XT patch monitor.   Your ZIO patch monitor will be [facility] USPS Priority [facility] your home address. The monitor may also be mailed to a PO BOX if home delivery is not available.   It may take 3-5 days to receive your monitor after you have been enrolled.   Once you have received you monitor, please review enclosed instructions.  Your monitor has already been registered assigning a specific monitor serial # to you.   Applying the monitor   Shave hair from upper left chest.   Hold abrader disc by orange tab.  Rub abrader in 40 strokes over left upper chest as indicated in your monitor instructions.   Clean area with 4 enclosed alcohol pads .  Use all pads to assure are is cleaned thoroughly.  Let  dry.   Apply patch as indicated in monitor instructions.  Patch will be place under collarbone on left side of chest with arrow pointing upward.   Rub patch adhesive wings for 2 minutes.Remove white label marked "1".  Remove white label marked "2".  Rub patch adhesive wings for 2 additional minutes.   While looking in a mirror, press and release button in center of patch.  A small green light will flash 3-4 times .  This will  be your only indicator the monitor has been turned on.     Do not shower for the first 24 hours.  You may shower after the first 24 hours.   Press button if you feel a symptom. You will hear a small click.  Record Date, Time and Symptom in the Patient Log Book.   When you are ready to remove patch, follow instructions on last 2 pages of Patient Log Book.  Stick patch monitor onto last page of Patient Log Book.   Place Patient Log Book in [place] box.  Use locking tab on box and tape box closed securely.  The [employer] and [employer] has [employer] on it.  Please place in mailbox as soon as possible.  Your physician should have your test results approximately 7 days after the monitor has been mailed back to [facility].   Call [place] at [phone] if you have questions regarding your ZIO XT patch monitor.  Call them immediately if you see an orange light blinking on your monitor.   If your monitor falls off in less than 4 days contact our Monitor department at [phone].  If your monitor becomes loose or falls off after 4 days call [employer] at [phone] for suggestions on securing your monitor.    Follow-Up:  Dec 17, 2020 at 11:20AM with Dr. Johney Frame    Your cardiac CT will be scheduled at one of the below locations:   [facility] [address] [place], [place] [address] [phone]  [place] [address] [place], [place] [address] [phone]  If scheduled at [facility], please arrive at the [facility] main entrance of [facility] 30 minutes prior to test start time. Proceed to the [facility] Radiology Department (first floor) to check-in and test prep.  If scheduled at [facility], please arrive 15 mins early for check-in and test prep.  Please follow these instructions carefully (unless otherwise directed):    On the Night  Before the Test: . Be sure to Drink plenty of water. . Do not consume any caffeinated/decaffeinated beverages or chocolate 12 hours prior to your test. . Do not take any antihistamines 12 hours prior to your test.  On the Day of the Test: . Drink plenty of water. Do not drink any water within one hour of the test. . Do not eat any food 4 hours prior to the test. . You may take your regular medications prior to the test.  . Take Ivabradine two hours prior to test. . FEMALES- please wear underwire-free bra if available     After the Test: . Drink plenty of water. . After receiving IV contrast, you may experience a mild flushed feeling. This is normal. . On occasion, you may experience a mild rash up to 24 hours after the test. This is not dangerous. If this occurs, you can take Benadryl 25 mg and increase your fluid intake. Marland Kitchen  If you experience trouble breathing, this can be serious. If it is severe call 911 IMMEDIATELY. If it is mild, please call our office.  Once we have confirmed authorization from your insurance company, we will call you to set up a date and time for your test. Based on how quickly your insurance processes prior authorizations requests, please allow up to 4 weeks to be contacted for scheduling your Cardiac CT appointment. Be advised that routine Cardiac CT appointments could be scheduled as many as 8 weeks after your provider has ordered it.  For non-scheduling related questions, please contact the cardiac imaging nurse navigator should you have any questions/concerns: Marchia Bond, Cardiac Imaging Nurse Navigator Burley Saver, Interim Cardiac Imaging Nurse [place] [facility] Direct Office Dial: [phone]   For scheduling needs, including cancellations and rescheduling, please call [place], [phone].    If you need a refill on your cardiac medications before your next appointment, please call your pharmacy.       Signed, Freada Bergeron, MD  11/01/2020 1:04 PM    Erin Donaldson

## 2020-10-30 NOTE — ED Triage Notes (Signed)
Pt brought by [facility] for syncopal episode. Pt loss consciousness that was witnessed. pt was assisted to recliner chair by daughter. Pt denies hitting head and denies being on blood thinners.

## 2020-10-31 LAB — RESP PANEL BY RT-PCR (FLU A&B, COVID) ARPGX2
Influenza A by PCR: NEGATIVE
Influenza B by PCR: NEGATIVE
SARS Coronavirus 2 by RT PCR: NEGATIVE

## 2020-10-31 NOTE — Telephone Encounter (Signed)
Patient went to ER yesterday and I set her up a ER follow up appointment.

## 2020-11-01 ENCOUNTER — Other Ambulatory Visit: Payer: Self-pay

## 2020-11-01 ENCOUNTER — Ambulatory Visit (INDEPENDENT_AMBULATORY_CARE_PROVIDER_SITE_OTHER): Payer: Medicare Other

## 2020-11-01 ENCOUNTER — Encounter: Payer: Self-pay | Admitting: Cardiology

## 2020-11-01 ENCOUNTER — Ambulatory Visit: Payer: Medicare Other | Admitting: Cardiology

## 2020-11-01 VITALS — BP 80/62 | HR 107 | Ht 64.0 in | Wt 123.0 lb

## 2020-11-01 DIAGNOSIS — R55 Syncope and collapse: Secondary | ICD-10-CM

## 2020-11-01 DIAGNOSIS — R9431 Abnormal electrocardiogram [ECG] [EKG]: Secondary | ICD-10-CM | POA: Diagnosis not present

## 2020-11-01 DIAGNOSIS — I951 Orthostatic hypotension: Secondary | ICD-10-CM | POA: Diagnosis not present

## 2020-11-01 DIAGNOSIS — Z9189 Other specified personal risk factors, not elsewhere classified: Secondary | ICD-10-CM | POA: Diagnosis not present

## 2020-11-01 DIAGNOSIS — S065XAA Traumatic subdural hemorrhage with loss of consciousness status unknown, initial encounter: Secondary | ICD-10-CM

## 2020-11-01 DIAGNOSIS — E876 Hypokalemia: Secondary | ICD-10-CM

## 2020-11-01 DIAGNOSIS — S065X9A Traumatic subdural hemorrhage with loss of consciousness of unspecified duration, initial encounter: Secondary | ICD-10-CM

## 2020-11-01 DIAGNOSIS — E46 Unspecified protein-calorie malnutrition: Secondary | ICD-10-CM

## 2020-11-01 MED ORDER — MIDODRINE HCL 5 MG PO TABS: 5.0000 mg | ORAL_TABLET | Freq: Three times a day (TID) | ORAL | 3 refills | Status: AC

## 2020-11-01 MED ORDER — ROSUVASTATIN CALCIUM 5 MG PO TABS: 5.0000 mg | ORAL_TABLET | Freq: Every day | ORAL | 3 refills | Status: AC

## 2020-11-01 MED ORDER — IVABRADINE HCL 5 MG PO TABS: 10.0000 mg | ORAL_TABLET | Freq: Once | ORAL | 0 refills | Status: AC

## 2020-11-01 NOTE — Patient Instructions (Addendum)
Medication Instructions:  Your physician has recommended you make the following change in your medication:   Begin:  Stop Lasix  Midodrine, 29m tablet, three times per day for low blood pressure. Please monitor your blood pressure once daily for 7 days. If your blood pressure remains less than 90/65 or greater than 140/90, please call the office to let uKoreaknow.  Crestor 522mtablet, once before bed each night  Labwork:  Your physician recommends that you return for lab work in: A few days before your Cardiac CT- BMP   Testing/Procedures:  Your physician has requested that you have an echocardiogram. Echocardiography is a painless test that uses sound waves to create images of your heart. It provides your doctor with information about the size and shape of your heart and how well your heart's chambers and valves are working. This procedure takes approximately one hour. There are no restrictions for this procedure.  ZIO XT- Long Term Monitor Instructions   Your physician has requested you wear your ZIO patch monitor 14 days.   This is a single patch monitor.  [employer] supplies one patch monitor per enrollment.  Additional stickers are not available.   Please do not apply patch if you will be having a Nuclear Stress Test, Echocardiogram, Cardiac CT, MRI, or Chest Xray during the time frame you would be wearing the monitor. The patch cannot be worn during these tests.  You cannot remove and re-apply the ZIO XT patch monitor.   Your ZIO patch monitor will [facility] USPS Priority [facility] your home address. The monitor may also be mailed to a PO BOX if home delivery is not available.   It may take 3-5 days to receive your monitor after you have been enrolled.   Once you have received you monitor, please review enclosed instructions.  Your monitor has already been registered assigning a specific monitor serial # to you.   Applying the monitor   Shave hair from  upper left chest.   Hold abrader disc by orange tab.  Rub abrader in 40 strokes over left upper chest as indicated in your monitor instructions.   Clean area with 4 enclosed alcohol pads .  Use all pads to assure are is cleaned thoroughly.  Let dry.   Apply patch as indicated in monitor instructions.  Patch will be place under collarbone on left side of chest with arrow pointing upward.   Rub patch adhesive wings for 2 minutes.Remove white label marked "1".  Remove white label marked "2".  Rub patch adhesive wings for 2 additional minutes.   While looking in a mirror, press and release button in center of patch.  A small green light will flash 3-4 times .  This will be your only indicator the monitor has been turned on.     Do not shower for the first 24 hours.  You may shower after the first 24 hours.   Press button if you feel a symptom. You will hear a small click.  Record Date, Time and Symptom in the Patient Log Book.   When you are ready to remove patch, follow instructions on last 2 pages of Patient Log Book.  Stick patch monitor onto last page of Patient Log Book.   Place Patient Log Book in BlValdeseox.  Use locking tab on box and tape box closed securely.  [employer] it.  Please place in mailbox as soon as  possible.  Your physician should have your test results approximately 7 days after the monitor has been mailed back to [facility].   Call [place] at [phone] if you have questions regarding your ZIO XT patch monitor.  Call them immediately if you see an orange light blinking on your monitor.   If your monitor falls off in less than 4 days contact our Monitor department at [phone].  If your monitor becomes loose or falls off after 4 days call [employer] at [phone] for suggestions on securing your monitor.    Follow-Up:  Dec 17, 2020 at 11:20AM with Dr. Johney Frame    Your cardiac CT will be scheduled at  one of the below locations:   [facility] [address] [place], [place] [address] [phone]  [place] [address] [place], [place] [address] [phone]  If scheduled at [facility], please arrive at the [facility] main entrance of [facility] 30 minutes prior to test start time. Proceed to the [facility] Radiology Department (first floor) to check-in and test prep.  If scheduled at [facility], please arrive 15 mins early for check-in and test prep.  Please follow these instructions carefully (unless otherwise directed):    On the Night Before the Test: . Be sure to Drink plenty of water. . Do not consume any caffeinated/decaffeinated beverages or chocolate 12 hours prior to your test. . Do not take any antihistamines 12 hours prior to your test.  On the Day of the Test: . Drink plenty of water. Do not drink any water within one hour of the test. . Do not eat any food 4 hours prior to the test. . You may take your regular medications prior to the test.  . Take Ivabradine two hours prior to test. . FEMALES- please wear underwire-free bra if available     After the Test: . Drink plenty of water. . After receiving IV contrast, you may experience a mild flushed feeling. This is normal. . On occasion, you may experience a mild rash up to 24 hours after the test. This is not dangerous. If this occurs, you can take Benadryl 25 mg and increase your fluid intake. . If you experience trouble breathing, this can be serious. If it is severe call 911 IMMEDIATELY. If it is mild, please call our office.  Once we have confirmed authorization from your insurance company, we will call you to set up a date and time for your test. Based on how quickly your insurance processes prior authorizations requests, please allow up to 4 weeks to be contacted for scheduling your Cardiac  CT appointment. Be advised that routine Cardiac CT appointments could be scheduled as many as 8 weeks after your provider has ordered it.  For non-scheduling related questions, please contact the cardiac imaging nurse navigator should you have any questions/concerns: Marchia Bond, Cardiac Imaging Nurse Navigator Burley Saver, Interim Cardiac Imaging Nurse [place] [facility] Direct Office Dial: [phone]   For scheduling needs, including cancellations and rescheduling, please call [place], [phone].    If you need a refill on your cardiac medications before your next appointment, please call your pharmacy.

## 2020-11-06 ENCOUNTER — Ambulatory Visit (INDEPENDENT_AMBULATORY_CARE_PROVIDER_SITE_OTHER): Payer: Medicare Other | Admitting: Nurse Practitioner

## 2020-11-06 ENCOUNTER — Other Ambulatory Visit: Payer: Self-pay

## 2020-11-06 ENCOUNTER — Encounter: Payer: Self-pay | Admitting: Nurse Practitioner

## 2020-11-06 VITALS — BP 82/58 | Temp 98.6°F | Ht 64.0 in | Wt 122.2 lb

## 2020-11-06 DIAGNOSIS — E538 Deficiency of other specified B group vitamins: Secondary | ICD-10-CM

## 2020-11-06 DIAGNOSIS — I959 Hypotension, unspecified: Secondary | ICD-10-CM

## 2020-11-06 NOTE — Progress Notes (Signed)
   Subjective:    Patient ID: Erin Donaldson, female    DOB: 1951/07/16, 69 y.o.   MRN: [id number]   Chief Complaint: Hypotension (ED follow-up, pt collapsed went to ED, hypotensive, dehydrated, 2020/11/23 AP)   HPI Patient comes in today for ED follow up. She was taken to trhe ED by EMS on 2020/11/23. She had passed out on her daughter at home. The EMS was not able to get a blood pressure  And took her to the hospital. She was diagnosed with dehydration in the ED and was given IV fluids. Daughter says she is no better. She saw cardiology the day after going to the ED and he started her on Midodrine to help raise blood pressure. Blood pressure at home is still running 80's systolic. They have not started on  the midodrine yet.   Review of Systems  Constitutional: Positive for fatigue.  Respiratory: Negative.   Cardiovascular: Negative.   Genitourinary: Negative.   Neurological: Positive for dizziness. Negative for headaches.  Psychiatric/Behavioral: Negative.   All other systems reviewed and are negative.      Objective:   Physical Exam Vitals and nursing note reviewed.  Constitutional:      Appearance: Normal appearance.  Cardiovascular:     Rate and Rhythm: Normal rate and regular rhythm.     Heart sounds: Normal heart sounds.  Pulmonary:     Breath sounds: Normal breath sounds.  Skin:    General: Skin is warm.  Neurological:     General: No focal deficit present.     Mental Status: She is alert and oriented to person, place, and time.  Psychiatric:        Mood and Affect: Mood normal.        Behavior: Behavior normal.    BP (!) 82/58   Temp 98.6 F (37 C) (Temporal)   Ht 5\' 4"  (1.626 m)   Wt 122 lb 3.2 oz (55.4 kg)   BMI 20.98 kg/m        Assessment & Plan:  Erin Donaldson in today with chief complaint of Hypotension (ED follow-up, pt collapsed went to ED, hypotensive, dehydrated, 11-23-20 AP)   1. Hypotension, unspecified hypotension type Force fluids Add  sodium to diet starting on midodrine as soon as possible Keep diary of blood pressure  2. Fatigue Hx b12 anemia Labs pending  [facility] records reviewed  The above assessment and management plan was discussed with the patient. The patient verbalized understanding of and has agreed to the management plan. Patient is aware to call the clinic if symptoms persist or worsen. Patient is aware when to return to the clinic for a follow-up visit. Patient educated on when it is appropriate to go to the emergency department.   [facility], FNP

## 2020-11-06 NOTE — Patient Instructions (Signed)
Hypotension As your heart beats, it forces blood through your body. This force is called blood pressure. If you have hypotension, you have low blood pressure. When your blood pressure is too low, you may not get enough blood to your brain or other parts of your body. This may cause you to feel weak, light-headed, have a fast heartbeat, or even pass out (faint). Low blood pressure may be harmless, or it may cause serious problems. What are the causes?  Blood loss.  Not enough water in the body (dehydration).  Heart problems.  Hormone problems.  Pregnancy.  A very bad infection.  Not having enough of certain nutrients.  Very bad allergic reactions.  Certain medicines. What increases the risk?  Age. The risk increases as you get older.  Conditions that affect the heart or the brain and spinal cord (central nervous system).  Taking certain medicines.  Being pregnant. What are the signs or symptoms?  Feeling: ? Weak. ? Light-headed. ? Dizzy. ? Tired (fatigued).  Blurred vision.  Fast heartbeat.  Passing out, in very bad cases. How is this treated?  Changing your diet. This may involve eating more salt (sodium) or drinking more water.  Taking medicines to raise your blood pressure.  Changing how much you take (the dosage) of some of your medicines.  Wearing compression stockings. These stockings help to prevent blood clots and reduce swelling in your legs. In some cases, you may need to go to the hospital for:  Fluid replacement. This means you will receive fluids through an IV tube.  Blood replacement. This means you will receive donated blood through an IV tube (transfusion).  Treating an infection or heart problems, if this applies.  Monitoring. You may need to be monitored while medicines that you are taking wear off. Follow these instructions at home: Eating and drinking   Drink enough fluids to keep your pee (urine) pale yellow.  Eat a healthy diet.  Follow instructions from your doctor about what you can eat or drink. A healthy diet includes: ? Fresh fruits and vegetables. ? Whole grains. ? Low-fat (lean) meats. ? Low-fat dairy products.  Eat extra salt only as told. Do not add extra salt to your diet unless your doctor tells you to.  Eat small meals often.  Avoid standing up quickly after you eat. Medicines  Take over-the-counter and prescription medicines only as told by your doctor. ? Follow instructions from your doctor about changing how much you take of your medicines, if this applies. ? Do not stop or change any of your medicines on your own. General instructions   Wear compression stockings as told by your doctor.  Get up slowly from lying down or sitting.  Avoid hot showers and a lot of heat as told by your doctor.  Return to your normal activities as told by your doctor. Ask what activities are safe for you.  Do not use any products that contain nicotine or tobacco, such as cigarettes, e-cigarettes, and chewing tobacco. If you need help quitting, ask your doctor.  Keep all follow-up visits as told by your doctor. This is important. Contact a doctor if:  You throw up (vomit).  You have watery poop (diarrhea).  You have a fever for more than 2-3 days.  You feel more thirsty than normal.  You feel weak and tired. Get help right away if:  You have chest pain.  You have a fast or uneven heartbeat.  You lose feeling (have numbness) in any   part of your body.  You cannot move your arms or your legs.  You have trouble talking.  You get sweaty or feel light-headed.  You pass out.  You have trouble breathing.  You have trouble staying awake.  You feel mixed up (confused). Summary  Hypotension is also called low blood pressure. It is when the force of blood pumping through your arteries is too weak.  Hypotension may be harmless, or it may cause serious problems.  Treatment may include changing  your diet and medicines, and wearing compression stockings.  In very bad cases, you may need to go to the hospital. This information is not intended to replace advice given to you by your health care provider. Make sure you discuss any questions you have with your health care provider. Document Revised: 04/28/2018 Document Reviewed: 04/28/2018 Elsevier Patient Education  2020 Elsevier Inc.  

## 2020-11-07 DIAGNOSIS — Z9189 Other specified personal risk factors, not elsewhere classified: Secondary | ICD-10-CM

## 2020-11-07 DIAGNOSIS — R55 Syncope and collapse: Secondary | ICD-10-CM | POA: Diagnosis not present

## 2020-11-14 ENCOUNTER — Telehealth: Payer: Self-pay

## 2020-11-14 NOTE — Telephone Encounter (Signed)
Needs to go to ED

## 2020-11-14 NOTE — Telephone Encounter (Signed)
Home health nurse aware she states she will do all she can to get patient to go to ED

## 2020-11-14 NOTE — Telephone Encounter (Signed)
HH nurse there now states pt BP was 80/50 she has her reclining back now with feet elevated it has come up to 90/60. Patient HR is running 122. She is where heart Monitor now. She has increased dizziness. Took all meds including meclizine an hour ago. Patient denies vision changes. She does have headache that has been on coming, but with pain there and with her bottom her pain is 8 out of 10. Please advise covering pcp.

## 2020-11-19 ENCOUNTER — Telehealth: Payer: Self-pay

## 2020-11-19 NOTE — Telephone Encounter (Signed)
Per office policy - patient needs appointment for antibiotic to be sent in  Please schedule appointment

## 2020-11-20 NOTE — Telephone Encounter (Signed)
Tried to call pt to let her know she needs appt but she didn't answer

## 2020-11-21 ENCOUNTER — Telehealth: Payer: Self-pay | Admitting: *Deleted

## 2020-11-21 NOTE — Telephone Encounter (Signed)
TC from [place] w/ [facility] Report abnormal vital HR this morning at rest was 118, HR last couple visits have been good, she did just take her BP meds She is seeing cardiology, wore heart monitor and is getting ready to mail it back in

## 2020-11-26 ENCOUNTER — Telehealth: Payer: Self-pay | Admitting: *Deleted

## 2020-11-26 NOTE — Telephone Encounter (Signed)
Pt did not see GI in December, cancelled and say cardiology first, since she went to the ED for an episode of syncope. Did instruct patient to call back and make appt to see Gastroenterologist. She has been able to keep more down since this morning.

## 2020-11-26 NOTE — Telephone Encounter (Signed)
Erin Donaldson [facility] Vital sign alert call - HR resting 108 today Also pt has had vomiting & not keeping anything down since Friday except sips of lemonade Did eat 1/2 yogurt this morning, has not vomited today. Has no fever, cough or SOB is homebound and not around anyone to be exposed to COVID

## 2020-11-26 NOTE — Telephone Encounter (Signed)
She scheduled a visit with gastroenterology as recommended on her last visit?  I am very concerned about this patient.

## 2020-11-28 ENCOUNTER — Telehealth: Payer: Self-pay | Admitting: Family Medicine

## 2020-11-28 ENCOUNTER — Telehealth: Payer: Self-pay

## 2020-11-28 NOTE — Telephone Encounter (Signed)
Appointment made for appt made

## 2020-11-28 NOTE — Telephone Encounter (Signed)
Erin Donaldson called from [facility] to give patients vitals report.  Heart Rate- 131 resting BP- 90/65  Swollen right side of neck  Pain 7 out of 10  Can reach Erin Donaldson if needed at  [phone]  (pt has an appt at [facility] tomorrow)

## 2020-11-28 NOTE — Telephone Encounter (Signed)
Pt said she woke up and her neck was swollen from ear down, hurts to touch and having a hard time swallowing. No issues with breathing at this time

## 2020-11-28 NOTE — Telephone Encounter (Signed)
Patient has appointment with MMM tomorrow   Erin Donaldson

## 2020-11-29 ENCOUNTER — Inpatient Hospital Stay (HOSPITAL_COMMUNITY)
Admission: EM | Admit: 2020-11-29 | Discharge: 2020-12-17 | DRG: 871 | Disposition: E | Payer: Medicare Other | Attending: Family Medicine | Admitting: Family Medicine

## 2020-11-29 ENCOUNTER — Ambulatory Visit (INDEPENDENT_AMBULATORY_CARE_PROVIDER_SITE_OTHER): Payer: Medicare Other | Admitting: Nurse Practitioner

## 2020-11-29 ENCOUNTER — Encounter (HOSPITAL_COMMUNITY): Payer: Self-pay

## 2020-11-29 ENCOUNTER — Emergency Department (HOSPITAL_COMMUNITY): Payer: Medicare Other

## 2020-11-29 ENCOUNTER — Encounter: Payer: Self-pay | Admitting: Nurse Practitioner

## 2020-11-29 ENCOUNTER — Inpatient Hospital Stay (HOSPITAL_COMMUNITY): Payer: Medicare Other

## 2020-11-29 ENCOUNTER — Other Ambulatory Visit: Payer: Self-pay

## 2020-11-29 VITALS — HR 130 | Temp 95.3°F | Ht 64.0 in

## 2020-11-29 DIAGNOSIS — Z515 Encounter for palliative care: Secondary | ICD-10-CM | POA: Diagnosis not present

## 2020-11-29 DIAGNOSIS — Z841 Family history of disorders of kidney and ureter: Secondary | ICD-10-CM

## 2020-11-29 DIAGNOSIS — F319 Bipolar disorder, unspecified: Secondary | ICD-10-CM | POA: Diagnosis present

## 2020-11-29 DIAGNOSIS — E785 Hyperlipidemia, unspecified: Secondary | ICD-10-CM | POA: Diagnosis present

## 2020-11-29 DIAGNOSIS — D649 Anemia, unspecified: Secondary | ICD-10-CM | POA: Diagnosis present

## 2020-11-29 DIAGNOSIS — R0689 Other abnormalities of breathing: Secondary | ICD-10-CM

## 2020-11-29 DIAGNOSIS — Z888 Allergy status to other drugs, medicaments and biological substances status: Secondary | ICD-10-CM

## 2020-11-29 DIAGNOSIS — Z79899 Other long term (current) drug therapy: Secondary | ICD-10-CM

## 2020-11-29 DIAGNOSIS — A419 Sepsis, unspecified organism: Secondary | ICD-10-CM | POA: Diagnosis present

## 2020-11-29 DIAGNOSIS — Y95 Nosocomial condition: Secondary | ICD-10-CM

## 2020-11-29 DIAGNOSIS — I959 Hypotension, unspecified: Secondary | ICD-10-CM

## 2020-11-29 DIAGNOSIS — Z8249 Family history of ischemic heart disease and other diseases of the circulatory system: Secondary | ICD-10-CM

## 2020-11-29 DIAGNOSIS — N179 Acute kidney failure, unspecified: Secondary | ICD-10-CM | POA: Diagnosis present

## 2020-11-29 DIAGNOSIS — J9601 Acute respiratory failure with hypoxia: Secondary | ICD-10-CM | POA: Diagnosis present

## 2020-11-29 DIAGNOSIS — L89151 Pressure ulcer of sacral region, stage 1: Secondary | ICD-10-CM

## 2020-11-29 DIAGNOSIS — K219 Gastro-esophageal reflux disease without esophagitis: Secondary | ICD-10-CM | POA: Diagnosis present

## 2020-11-29 DIAGNOSIS — R000 Tachycardia, unspecified: Secondary | ICD-10-CM | POA: Diagnosis not present

## 2020-11-29 DIAGNOSIS — Z823 Family history of stroke: Secondary | ICD-10-CM

## 2020-11-29 DIAGNOSIS — F39 Unspecified mood [affective] disorder: Secondary | ICD-10-CM | POA: Diagnosis present

## 2020-11-29 DIAGNOSIS — K589 Irritable bowel syndrome without diarrhea: Secondary | ICD-10-CM | POA: Diagnosis present

## 2020-11-29 DIAGNOSIS — G9341 Metabolic encephalopathy: Secondary | ICD-10-CM | POA: Diagnosis not present

## 2020-11-29 DIAGNOSIS — E119 Type 2 diabetes mellitus without complications: Secondary | ICD-10-CM | POA: Diagnosis present

## 2020-11-29 DIAGNOSIS — Z813 Family history of other psychoactive substance abuse and dependence: Secondary | ICD-10-CM

## 2020-11-29 DIAGNOSIS — F419 Anxiety disorder, unspecified: Secondary | ICD-10-CM | POA: Diagnosis present

## 2020-11-29 DIAGNOSIS — R6521 Severe sepsis with septic shock: Secondary | ICD-10-CM | POA: Diagnosis present

## 2020-11-29 DIAGNOSIS — E860 Dehydration: Secondary | ICD-10-CM | POA: Diagnosis present

## 2020-11-29 DIAGNOSIS — G43909 Migraine, unspecified, not intractable, without status migrainosus: Secondary | ICD-10-CM | POA: Diagnosis present

## 2020-11-29 DIAGNOSIS — I10 Essential (primary) hypertension: Secondary | ICD-10-CM | POA: Diagnosis present

## 2020-11-29 DIAGNOSIS — Z87828 Personal history of other (healed) physical injury and trauma: Secondary | ICD-10-CM

## 2020-11-29 DIAGNOSIS — Z66 Do not resuscitate: Secondary | ICD-10-CM | POA: Diagnosis not present

## 2020-11-29 DIAGNOSIS — J189 Pneumonia, unspecified organism: Secondary | ICD-10-CM | POA: Diagnosis present

## 2020-11-29 DIAGNOSIS — Z885 Allergy status to narcotic agent status: Secondary | ICD-10-CM

## 2020-11-29 DIAGNOSIS — K1121 Acute sialoadenitis: Secondary | ICD-10-CM | POA: Diagnosis not present

## 2020-11-29 DIAGNOSIS — R220 Localized swelling, mass and lump, head: Secondary | ICD-10-CM | POA: Diagnosis not present

## 2020-11-29 DIAGNOSIS — L8990 Pressure ulcer of unspecified site, unspecified stage: Secondary | ICD-10-CM | POA: Insufficient documentation

## 2020-11-29 DIAGNOSIS — Z9884 Bariatric surgery status: Secondary | ICD-10-CM

## 2020-11-29 DIAGNOSIS — R0602 Shortness of breath: Secondary | ICD-10-CM

## 2020-11-29 DIAGNOSIS — E876 Hypokalemia: Secondary | ICD-10-CM | POA: Diagnosis not present

## 2020-11-29 DIAGNOSIS — Z811 Family history of alcohol abuse and dependence: Secondary | ICD-10-CM

## 2020-11-29 DIAGNOSIS — Z818 Family history of other mental and behavioral disorders: Secondary | ICD-10-CM

## 2020-11-29 DIAGNOSIS — Z20822 Contact with and (suspected) exposure to covid-19: Secondary | ICD-10-CM | POA: Diagnosis present

## 2020-11-29 DIAGNOSIS — R0600 Dyspnea, unspecified: Secondary | ICD-10-CM

## 2020-11-29 DIAGNOSIS — Z881 Allergy status to other antibiotic agents status: Secondary | ICD-10-CM

## 2020-11-29 DIAGNOSIS — Z803 Family history of malignant neoplasm of breast: Secondary | ICD-10-CM

## 2020-11-29 DIAGNOSIS — Z833 Family history of diabetes mellitus: Secondary | ICD-10-CM

## 2020-11-29 LAB — LACTIC ACID, PLASMA
Lactic Acid, Venous: 3 mmol/L (ref 0.5–1.9)
Lactic Acid, Venous: 1.9 mmol/L (ref 0.5–1.9)

## 2020-11-29 LAB — APTT: aPTT: 38 seconds — ABNORMAL HIGH (ref 24–36)

## 2020-11-29 LAB — CBC WITH DIFFERENTIAL/PLATELET
Band Neutrophils: 4 %
Basophils Absolute: 0 10*3/uL (ref 0.0–0.1)
Basophils Relative: 0 %
Eosinophils Absolute: 0 10*3/uL (ref 0.0–0.5)
Eosinophils Relative: 0 %
HCT: 36.6 % (ref 36.0–46.0)
Hemoglobin: 11.9 g/dL — ABNORMAL LOW (ref 12.0–15.0)
Lymphocytes Relative: 5 %
Lymphs Abs: 1.2 10*3/uL (ref 0.7–4.0)
MCH: 30.6 pg (ref 26.0–34.0)
MCHC: 32.5 g/dL (ref 30.0–36.0)
MCV: 94.1 fL (ref 80.0–100.0)
Monocytes Absolute: 0.9 10*3/uL (ref 0.1–1.0)
Monocytes Relative: 4 %
Neutro Abs: 21.3 10*3/uL — ABNORMAL HIGH (ref 1.7–7.7)
Neutrophils Relative %: 87 %
Platelets: 349 10*3/uL (ref 150–400)
RBC: 3.89 MIL/uL (ref 3.87–5.11)
RDW: 16.9 % — ABNORMAL HIGH (ref 11.5–15.5)
WBC: 23.4 10*3/uL — ABNORMAL HIGH (ref 4.0–10.5)
nRBC: 0 % (ref 0.0–0.2)

## 2020-11-29 LAB — URINALYSIS, ROUTINE W REFLEX MICROSCOPIC
Bacteria, UA: NONE SEEN
Glucose, UA: NEGATIVE mg/dL
Hgb urine dipstick: NEGATIVE
Ketones, ur: NEGATIVE mg/dL
Leukocytes,Ua: NEGATIVE
Nitrite: NEGATIVE
Protein, ur: 100 mg/dL — AB
Specific Gravity, Urine: 1.029 (ref 1.005–1.030)
pH: 5 (ref 5.0–8.0)

## 2020-11-29 LAB — BRAIN NATRIURETIC PEPTIDE: B Natriuretic Peptide: 149 pg/mL — ABNORMAL HIGH (ref 0.0–100.0)

## 2020-11-29 LAB — TYPE AND SCREEN
ABO/RH(D): O POS
Antibody Screen: NEGATIVE

## 2020-11-29 LAB — COMPREHENSIVE METABOLIC PANEL
ALT: 29 U/L (ref 0–44)
AST: 24 U/L (ref 15–41)
Albumin: 1.9 g/dL — ABNORMAL LOW (ref 3.5–5.0)
Alkaline Phosphatase: 170 U/L — ABNORMAL HIGH (ref 38–126)
Anion gap: 12 (ref 5–15)
BUN: 28 mg/dL — ABNORMAL HIGH (ref 8–23)
CO2: 19 mmol/L — ABNORMAL LOW (ref 22–32)
Calcium: 8.2 mg/dL — ABNORMAL LOW (ref 8.9–10.3)
Chloride: 104 mmol/L (ref 98–111)
Creatinine, Ser: 1.23 mg/dL — ABNORMAL HIGH (ref 0.44–1.00)
GFR, Estimated: 48 mL/min — ABNORMAL LOW (ref >60)
Glucose, Bld: 102 mg/dL — ABNORMAL HIGH (ref 70–99)
Potassium: 3.7 mmol/L (ref 3.5–5.1)
Sodium: 135 mmol/L (ref 135–145)
Total Bilirubin: 1.1 mg/dL (ref 0.3–1.2)
Total Protein: 5.2 g/dL — ABNORMAL LOW (ref 6.5–8.1)

## 2020-11-29 LAB — TROPONIN I (HIGH SENSITIVITY)
Troponin I (High Sensitivity): 16 ng/L (ref <18)
Troponin I (High Sensitivity): 16 ng/L (ref <18)

## 2020-11-29 LAB — CORTISOL-AM, BLOOD: Cortisol - AM: 35.5 ug/dL — ABNORMAL HIGH (ref 6.7–22.6)

## 2020-11-29 LAB — CBG MONITORING, ED: Glucose-Capillary: 101 mg/dL — ABNORMAL HIGH (ref 70–99)

## 2020-11-29 LAB — RESP PANEL BY RT-PCR (FLU A&B, COVID) ARPGX2
Influenza A by PCR: NEGATIVE
Influenza B by PCR: NEGATIVE
SARS Coronavirus 2 by RT PCR: NEGATIVE

## 2020-11-29 LAB — GLUCOSE, CAPILLARY
Glucose-Capillary: 131 mg/dL — ABNORMAL HIGH (ref 70–99)
Glucose-Capillary: 148 mg/dL — ABNORMAL HIGH (ref 70–99)

## 2020-11-29 LAB — POC OCCULT BLOOD, ED: Fecal Occult Bld: POSITIVE — AB

## 2020-11-29 LAB — PROTIME-INR
INR: 1 (ref 0.8–1.2)
Prothrombin Time: 12.7 seconds (ref 11.4–15.2)

## 2020-11-29 LAB — CORTISOL: Cortisol, Plasma: 72.9 ug/dL

## 2020-11-29 MED ORDER — ACETAMINOPHEN 650 MG RE SUPP: 650.0000 mg | Freq: Four times a day (QID) | RECTAL | Status: DC | PRN

## 2020-11-29 MED ORDER — DIPHENHYDRAMINE HCL 50 MG/ML IJ SOLN
25.0000 mg | Freq: Once | INTRAMUSCULAR | Status: AC
  Administered 2020-11-29: 25 mg via INTRAVENOUS
  Filled 2020-11-29: qty 1

## 2020-11-29 MED ORDER — SODIUM CHLORIDE 0.9% FLUSH
3.0000 mL | Freq: Two times a day (BID) | INTRAVENOUS | Status: DC
  Administered 2020-11-30 – 2020-12-02 (×2): 3 mL via INTRAVENOUS

## 2020-11-29 MED ORDER — EPINEPHRINE 0.3 MG/0.3ML IJ SOAJ
INTRAMUSCULAR | Status: AC
  Filled 2020-11-29: qty 0.3

## 2020-11-29 MED ORDER — POLYETHYLENE GLYCOL 3350 17 G PO PACK: 17.0000 g | PACK | Freq: Every day | ORAL | Status: DC

## 2020-11-29 MED ORDER — SODIUM CHLORIDE 0.9 % IV SOLN: INTRAVENOUS | Status: DC

## 2020-11-29 MED ORDER — ADULT MULTIVITAMIN LIQUID CH
15.0000 mL | Freq: Every day | ORAL | Status: DC
  Filled 2020-11-29 (×4): qty 15

## 2020-11-29 MED ORDER — BOOST HIGH PROTEIN PO LIQD
1.0000 | Freq: Three times a day (TID) | ORAL | Status: DC
  Administered 2020-11-30: 237 mL via ORAL
  Filled 2020-11-29 (×10): qty 237

## 2020-11-29 MED ORDER — HEPARIN SODIUM (PORCINE) 5000 UNIT/ML IJ SOLN
5000.0000 [IU] | Freq: Three times a day (TID) | INTRAMUSCULAR | Status: DC
  Administered 2020-11-29 – 2020-12-02 (×9): 5000 [IU] via SUBCUTANEOUS
  Filled 2020-11-29 (×8): qty 1

## 2020-11-29 MED ORDER — SODIUM CHLORIDE 0.9 % IV SOLN
2.0000 g | Freq: Once | INTRAVENOUS | Status: AC
  Administered 2020-11-29: 2 g via INTRAVENOUS
  Filled 2020-11-29: qty 2

## 2020-11-29 MED ORDER — VANCOMYCIN HCL IN DEXTROSE 1-5 GM/200ML-% IV SOLN
1000.0000 mg | Freq: Once | INTRAVENOUS | Status: AC
  Administered 2020-11-29: 1000 mg via INTRAVENOUS
  Filled 2020-11-29: qty 200

## 2020-11-29 MED ORDER — PHENYLEPHRINE HCL-NACL 10-0.9 MG/250ML-% IV SOLN
25.0000 ug/min | INTRAVENOUS | Status: DC
  Administered 2020-11-29: 65 ug/min via INTRAVENOUS
  Administered 2020-11-29: 140 ug/min via INTRAVENOUS
  Administered 2020-11-29: 100 ug/min via INTRAVENOUS
  Filled 2020-11-29 (×5): qty 250
  Filled 2020-11-29: qty 1000

## 2020-11-29 MED ORDER — SODIUM CHLORIDE 0.9 % IV BOLUS
2000.0000 mL | Freq: Once | INTRAVENOUS | Status: AC
  Administered 2020-11-29: 2000 mL via INTRAVENOUS

## 2020-11-29 MED ORDER — ADULT MULTIVITAMIN W/MINERALS CH
1.0000 | ORAL_TABLET | Freq: Every day | ORAL | Status: DC
  Administered 2020-11-30: 1 via ORAL
  Filled 2020-11-29: qty 1

## 2020-11-29 MED ORDER — DULOXETINE HCL 60 MG PO CPEP
60.0000 mg | ORAL_CAPSULE | Freq: Every day | ORAL | Status: DC
  Administered 2020-11-30: 60 mg via ORAL
  Filled 2020-11-29: qty 1

## 2020-11-29 MED ORDER — ALUM & MAG HYDROXIDE-SIMETH 200-200-20 MG/5ML PO SUSP
15.0000 mL | Freq: Four times a day (QID) | ORAL | Status: DC | PRN
  Administered 2020-11-29: 15 mL via ORAL
  Filled 2020-11-29: qty 30

## 2020-11-29 MED ORDER — NOREPINEPHRINE 4 MG/250ML-% IV SOLN
2.0000 ug/min | INTRAVENOUS | Status: DC
  Administered 2020-11-29: 2 ug/min via INTRAVENOUS
  Filled 2020-11-29: qty 250

## 2020-11-29 MED ORDER — FLUTICASONE PROPIONATE 50 MCG/ACT NA SUSP
2.0000 | Freq: Every day | NASAL | Status: DC
  Administered 2020-11-30: 2 via NASAL
  Filled 2020-11-29: qty 16

## 2020-11-29 MED ORDER — SODIUM CHLORIDE 0.9 % IV BOLUS
1000.0000 mL | Freq: Once | INTRAVENOUS | Status: AC
  Administered 2020-11-29: 1000 mL via INTRAVENOUS

## 2020-11-29 MED ORDER — VITAMIN D (ERGOCALCIFEROL) 1.25 MG (50000 UNIT) PO CAPS
50000.0000 [IU] | ORAL_CAPSULE | ORAL | Status: DC
  Administered 2020-11-29: 50000 [IU] via ORAL
  Filled 2020-11-29: qty 1

## 2020-11-29 MED ORDER — SODIUM CHLORIDE 0.9 % IV SOLN: 250.0000 mL | INTRAVENOUS | Status: DC

## 2020-11-29 MED ORDER — NOREPINEPHRINE 4 MG/250ML-% IV SOLN
INTRAVENOUS | Status: AC
  Administered 2020-11-29: 4 ug/min via INTRAVENOUS
  Filled 2020-11-29: qty 250

## 2020-11-29 MED ORDER — GABAPENTIN 100 MG PO CAPS
200.0000 mg | ORAL_CAPSULE | Freq: Three times a day (TID) | ORAL | Status: DC
  Administered 2020-11-29 – 2020-11-30 (×5): 200 mg via ORAL
  Filled 2020-11-29 (×5): qty 2

## 2020-11-29 MED ORDER — SODIUM CHLORIDE 0.9 % IV SOLN
2.0000 g | Freq: Once | INTRAVENOUS | Status: DC
  Filled 2020-11-29: qty 2

## 2020-11-29 MED ORDER — PANTOPRAZOLE SODIUM 40 MG PO TBEC: 40.0000 mg | DELAYED_RELEASE_TABLET | Freq: Every day | ORAL | Status: DC

## 2020-11-29 MED ORDER — LAMOTRIGINE 100 MG PO TABS
200.0000 mg | ORAL_TABLET | Freq: Every day | ORAL | Status: DC
Start: 2020-11-29 — End: 2020-12-03
  Administered 2020-11-30: 200 mg via ORAL
  Filled 2020-11-29: qty 2

## 2020-11-29 MED ORDER — SODIUM CHLORIDE 0.9 % IV SOLN
2.0000 g | Freq: Two times a day (BID) | INTRAVENOUS | Status: DC
  Administered 2020-11-30 – 2020-12-02 (×5): 2 g via INTRAVENOUS
  Filled 2020-11-29 (×5): qty 2

## 2020-11-29 MED ORDER — POLYETHYLENE GLYCOL 3350 17 G PO PACK
17.0000 g | PACK | Freq: Every day | ORAL | Status: DC | PRN
Start: 2020-11-29 — End: 2020-12-03

## 2020-11-29 MED ORDER — ROSUVASTATIN CALCIUM 5 MG PO TABS
5.0000 mg | ORAL_TABLET | Freq: Every day | ORAL | Status: DC
  Administered 2020-11-30: 5 mg via ORAL
  Filled 2020-11-29: qty 1

## 2020-11-29 MED ORDER — NOREPINEPHRINE 4 MG/250ML-% IV SOLN
2.0000 ug/min | INTRAVENOUS | Status: DC
  Administered 2020-11-30: 9 ug/min via INTRAVENOUS
  Administered 2020-11-30: 5 ug/min via INTRAVENOUS
  Administered 2020-12-01: 10 ug/min via INTRAVENOUS
  Administered 2020-12-01: 7 ug/min via INTRAVENOUS
  Filled 2020-11-29 (×5): qty 250

## 2020-11-29 MED ORDER — ALBUTEROL SULFATE HFA 108 (90 BASE) MCG/ACT IN AERS: 2.0000 | INHALATION_SPRAY | RESPIRATORY_TRACT | Status: DC | PRN

## 2020-11-29 MED ORDER — MIDODRINE HCL 5 MG PO TABS
10.0000 mg | ORAL_TABLET | Freq: Three times a day (TID) | ORAL | Status: DC
  Administered 2020-11-29 – 2020-11-30 (×4): 10 mg via ORAL
  Filled 2020-11-29 (×4): qty 2

## 2020-11-29 MED ORDER — QUETIAPINE FUMARATE 100 MG PO TABS
200.0000 mg | ORAL_TABLET | Freq: Every day | ORAL | Status: DC
  Administered 2020-11-30 (×2): 200 mg via ORAL
  Filled 2020-11-29 (×2): qty 2

## 2020-11-29 MED ORDER — ONDANSETRON HCL 4 MG PO TABS: 4.0000 mg | ORAL_TABLET | Freq: Four times a day (QID) | ORAL | Status: DC | PRN

## 2020-11-29 MED ORDER — TRAZODONE HCL 50 MG PO TABS: 50.0000 mg | ORAL_TABLET | Freq: Every evening | ORAL | Status: DC | PRN

## 2020-11-29 MED ORDER — SODIUM CHLORIDE 0.9 % IV SOLN
250.0000 mL | INTRAVENOUS | Status: DC
  Administered 2020-11-29: 250 mL via INTRAVENOUS

## 2020-11-29 MED ORDER — EPINEPHRINE 0.3 MG/0.3ML IJ SOAJ
0.3000 mg | Freq: Once | INTRAMUSCULAR | Status: AC
  Administered 2020-11-29: 0.3 mg via INTRAMUSCULAR

## 2020-11-29 MED ORDER — METHYLPREDNISOLONE SODIUM SUCC 125 MG IJ SOLR
125.0000 mg | Freq: Once | INTRAMUSCULAR | Status: AC
  Administered 2020-11-29: 125 mg via INTRAVENOUS
  Filled 2020-11-29: qty 2

## 2020-11-29 MED ORDER — PANTOPRAZOLE SODIUM 40 MG IV SOLR
40.0000 mg | Freq: Two times a day (BID) | INTRAVENOUS | Status: DC
  Administered 2020-11-30 – 2020-12-02 (×6): 40 mg via INTRAVENOUS
  Filled 2020-11-29 (×6): qty 40

## 2020-11-29 MED ORDER — PANTOPRAZOLE SODIUM 40 MG IV SOLR
40.0000 mg | Freq: Once | INTRAVENOUS | Status: AC
  Administered 2020-11-29: 40 mg via INTRAVENOUS
  Filled 2020-11-29: qty 40

## 2020-11-29 MED ORDER — SODIUM CHLORIDE 0.9% FLUSH
3.0000 mL | Freq: Two times a day (BID) | INTRAVENOUS | Status: DC
  Administered 2020-11-30 – 2020-12-02 (×3): 3 mL via INTRAVENOUS

## 2020-11-29 MED ORDER — BISACODYL 10 MG RE SUPP: 10.0000 mg | Freq: Every day | RECTAL | Status: DC | PRN

## 2020-11-29 MED ORDER — SODIUM CHLORIDE 0.9 % IV SOLN: 250.0000 mL | INTRAVENOUS | Status: DC | PRN

## 2020-11-29 MED ORDER — CHLORHEXIDINE GLUCONATE CLOTH 2 % EX PADS
6.0000 | MEDICATED_PAD | Freq: Every day | CUTANEOUS | Status: DC
  Administered 2020-11-29 – 2020-12-02 (×4): 6 via TOPICAL

## 2020-11-29 MED ORDER — VANCOMYCIN HCL 750 MG/150ML IV SOLN
750.0000 mg | INTRAVENOUS | Status: DC
  Administered 2020-11-30 – 2020-12-01 (×2): 750 mg via INTRAVENOUS
  Filled 2020-11-29 (×5): qty 150

## 2020-11-29 MED ORDER — DONEPEZIL HCL 5 MG PO TABS
10.0000 mg | ORAL_TABLET | Freq: Every day | ORAL | Status: DC
  Administered 2020-11-30: 10 mg via ORAL
  Filled 2020-11-29: qty 2

## 2020-11-29 MED ORDER — ONDANSETRON HCL 4 MG/2ML IJ SOLN
4.0000 mg | Freq: Four times a day (QID) | INTRAMUSCULAR | Status: DC | PRN
  Administered 2020-11-29: 4 mg via INTRAVENOUS
  Filled 2020-11-29: qty 2

## 2020-11-29 MED ORDER — SENNOSIDES-DOCUSATE SODIUM 8.6-50 MG PO TABS
2.0000 | ORAL_TABLET | Freq: Every day | ORAL | Status: DC
  Administered 2020-11-30 (×2): 2 via ORAL
  Filled 2020-11-29 (×2): qty 2

## 2020-11-29 MED ORDER — IOHEXOL 300 MG/ML  SOLN
75.0000 mL | Freq: Once | INTRAMUSCULAR | Status: AC | PRN
  Administered 2020-11-29: 75 mL via INTRAVENOUS

## 2020-11-29 MED ORDER — ACETAMINOPHEN 325 MG PO TABS
650.0000 mg | ORAL_TABLET | Freq: Four times a day (QID) | ORAL | Status: DC | PRN
  Administered 2020-11-30: 650 mg via ORAL
  Filled 2020-11-29: qty 2

## 2020-11-29 MED ORDER — CYCLOBENZAPRINE HCL 10 MG PO TABS
5.0000 mg | ORAL_TABLET | Freq: Two times a day (BID) | ORAL | Status: DC | PRN
  Administered 2020-11-29: 10 mg via ORAL
  Filled 2020-11-29: qty 1

## 2020-11-29 MED ORDER — SODIUM CHLORIDE 0.9% FLUSH: 3.0000 mL | INTRAVENOUS | Status: DC | PRN

## 2020-11-29 MED ORDER — LACTATED RINGERS IV BOLUS
1000.0000 mL | Freq: Once | INTRAVENOUS | Status: AC
  Administered 2020-11-29: 1000 mL via INTRAVENOUS

## 2020-11-29 NOTE — Consult Note (Signed)
NAME:  Erin Donaldson, MRN:  [id number], DOB:  29-Jan-1951, LOS: 0 ADMISSION DATE:  12/07/2020, CONSULTATION DATE:  12/10/2020  REFERRING MD:  EDP, Zammit, CHIEF COMPLAINT:  hypotension   Brief History:  70 year old post gastric bypass, chronic hypotension on midodrine admitted with septic shock due to presumed HAP, treated for left lower lobe CAP 09/2020  History of Present Illness:  She had gastric bypass in 2005.  She has chronic hypertension with a syncopal episode in the past, was evaluated by cardiology 11/01/20 and started on midodrine She went to PCP office 1/14 with right facial swelling and was found to be hypotensive and tachycardic .  ED initial vital signs were temperature of 95, tachycardic to 130, blood pressure 60s, improved to 80s with 2 L IV fluids.  Lactate 3.0 troponin negative, WBC count 23K, fecal occult blood positive, hemoglobin 9.9 Given epinephrine IM x 1 for?  Suspicion of allergic reaction due to parotid swelling  Past Medical History:  Bipolar disorder Diabetes mellitus Status post gastric bypass 2005 Subdural hematoma  Significant Hospital Events:    Consults:    Procedures:    Significant Diagnostic Tests:  CT maxillofacial 1/14 >>  Micro Data:  Blood 1/14 Urine 1/14  Antimicrobials:  Cefepime 1/14 Vancomycin 1/14  Interim History / Subjective:  States feeling better. Blood pressure improved after starting peripheral Levophed  Objective   Blood pressure 112/65, pulse (!) 124, temperature (!) 96.6 F (35.9 C), resp. rate 15, height 5\' 4"  (1.626 m), weight 54 kg, SpO2 97 %.        Intake/Output Summary (Last 24 hours) at 12/01/2020 1331 Last data filed at 12/05/2020 1229 Gross per 24 hour  Intake 2300 ml  Output --  Net 2300 ml   Filed Weights   12/07/2020 1030  Weight: 54 kg    Examination: General: Elderly woman, lying supine, no distress in ED stretcher HENT: No pallor, no icterus , right parotid swelling, mild tenderness, dry  oral mucosa Lungs: Clear breath sounds bilateral, no rhonchi Cardiovascular: S1-S2 tacky, regular, no murmur Abdomen: Soft, nontender abdomen Extremities: No edema, mild erythema right leg Neuro: Nonfocal, alert and oriented x3 GU: Concentrated urine in Foley bag   Chest x-ray dependently reviewed shows bilateral basal patchy infiltrates  Resolved Hospital Problem list     Assessment & Plan:  Septic shock presumably due to pneumonia, although her presenting complaint seems to have been due to parotitis.  She also did not have any symptoms referable to pneumonia Lactate has cleared which is a good prognostic sign  Recommend -Agree with broad-spectrum cefepime/vancomycin -Imaging right parotid gland to rule out obstruction/abscess with maxillofacial CT -Due to significant tachycardia on Levophed , will switch to peripheral Neo-Synephrine.  She seems to have responded with adequate blood pressure on peripheral doses   Chronic hypertension -resume midodrine Check random cortisol for adrenal insufficiency  Upper GI bleed -fecal occult blood positive, stable hemoglobin GI evaluation, this was planned as outpatient but probably needs this as inpatient  Best practice (evaluated daily)  Diet: N.p.o. Mobility: Bedrest Disposition: ICU  Goals of Care:   Code Status: Full  Labs   CBC: Recent Labs  Lab 11/24/2020 1041  WBC 23.4*  NEUTROABS 21.3*  HGB 11.9*  HCT 36.6  MCV 94.1  PLT 349    Basic Metabolic Panel: Recent Labs  Lab 12/13/2020 1041  NA 135  K 3.7  CL 104  CO2 19*  GLUCOSE 102*  BUN 28*  CREATININE 1.23*  CALCIUM  8.2*   GFR: Estimated Creatinine Clearance: 37.8 mL/min (A) (by C-G formula based on SCr of 1.23 mg/dL (H)). Recent Labs  Lab 12/04/20 1041 Dec 04, 2020 1252  WBC 23.4*  --   LATICACIDVEN 3.0* 1.9    Liver Function Tests: Recent Labs  Lab 12/04/2020 1041  AST 24  ALT 29  ALKPHOS 170*  BILITOT 1.1  PROT 5.2*  ALBUMIN 1.9*   No results  for input(s): LIPASE, AMYLASE in the last 168 hours. No results for input(s): AMMONIA in the last 168 hours.  ABG    Component Value Date/Time   TCO2 23 07/07/2020 1538     Coagulation Profile: Recent Labs  Lab 12/04/20 1041  INR 1.0    Cardiac Enzymes: No results for input(s): CKTOTAL, CKMB, CKMBINDEX, TROPONINI in the last 168 hours.  HbA1C: HB A1C (BAYER DCA - WAIVED)  Date/Time Value Ref Range Status  06/14/2020 02:36 PM 6.8 <7.0 % Final    Comment:                                          Diabetic Adult            <7.0                                       Healthy Adult        4.3 - 5.7                                                           (DCCT/NGSP) American Diabetes Association's Summary of Glycemic Recommendations for Adults with Diabetes: Hemoglobin A1c <7.0%. More stringent glycemic goals (A1c <6.0%) may further reduce complications at the cost of increased risk of hypoglycemia.   11/03/2019 10:44 AM 6.1 <7.0 % Final    Comment:                                          Diabetic Adult            <7.0                                       Healthy Adult        4.3 - 5.7                                                           (DCCT/NGSP) American Diabetes Association's Summary of Glycemic Recommendations for Adults with Diabetes: Hemoglobin A1c <7.0%. More stringent glycemic goals (A1c <6.0%) may further reduce complications at the cost of increased risk of hypoglycemia.    Hgb A1c MFr Bld  Date/Time Value Ref Range Status  07/24/2020 03:50 AM 6.5 (H) 4.8 - 5.6 % Final    Comment:    (  NOTE) Pre diabetes:          5.7%-6.4%  Diabetes:              >6.4%  Glycemic control for   <7.0% adults with diabetes     CBG: Recent Labs  Lab 14-Dec-2020 1036  GLUCAP 101*    Review of Systems:   Generalized weakness Right face swelling and pain No dizziness, chest pain, palpitations No dyspnea, cough, wheezing No hematemesis or melena No skin rash,  itching  Past Medical History:  She,  has a past medical history of Anemia, Anxiety, Bipolar 1 disorder (HCC), Bipolar affective (HCC), Depression, Diabetes in pregnancy, Diabetes mellitus, Diabetes mellitus without complication (HCC), Elevated LFTs, GERD (gastroesophageal reflux disease), Hyperlipidemia, Hypertension, IBS (irritable bowel syndrome), and Migraine.   Surgical History:   Past Surgical History:  Procedure Laterality Date  . BURR HOLE [name] 07/24/2020   Procedure: Ines Bloomer hole for evacuation of chronic subdural with scalp laceration repair;  Surgeon: Coletta Memos, MD;  Location: [facility] OR;  Service: Neurosurgery;  Laterality: [name];  Burr hole for evacuation of chronic subdural with scalp laceration repair  . CHOLECYSTECTOMY    . GASTRIC BYPASS  2005  . SCALP LACERATION REPAIR [name] 07/24/2020   Procedure: SCALP LACERATION REPAIR;  Surgeon: Coletta Memos, MD;  Location: [facility] OR;  Service: Neurosurgery;  Laterality: [name];  SCALP LACERATION REPAIR     Social History:   reports that she has never smoked. She has never used smokeless tobacco. She reports current drug use. Drug: Mescaline. She reports that she does not drink alcohol.   Family History:  Her family history includes Alcohol abuse in her brother; Breast cancer in her mother; CAD in her father; Cancer in her mother; Dementia in her brother; Depression in her sister; Diabetes in her daughter, father, maternal aunt, maternal uncle, paternal aunt, and paternal uncle; Drug abuse in her brother and daughter; Heart attack in her maternal grandfather; Heart disease in her brother, maternal aunt, maternal grandfather, maternal uncle, paternal aunt, paternal grandfather, and paternal uncle; Heart disease (age of onset: 64) in her mother; Heart disease (age of onset: 27) in her father; Hypertension in her sister; Kidney disease in her father; Other in her brother; Stroke in her maternal grandfather and maternal grandmother. There is no history of Colon  cancer.   Allergies Allergies  Allergen Reactions  . Bactrim [Sulfamethoxazole-Trimethoprim]     Raw and irritatated in mouth.  Leodis [place] [Propoxyphene N-Acetaminophen]   . Decongestant [Oxymetazoline]   . Elavil [Amitriptyline]   . Lithium Other (See Comments)    Hx toxicity  . Risperidone And Related   . Ultram [Tramadol]   . Cephalosporins Rash and Other (See Comments)    Keflex- Higher dose cause a rash  . Keflex [Cephalexin] Rash  . Omnicef [Cefdinir] Rash     Home Medications  Prior to Admission medications   Medication Sig Start Date End Date Taking? Authorizing Provider  Aspirin-Caffeine (BC FAST PAIN RELIEF PO) Take 1 tablet by mouth daily as needed.   Yes [provider]  cyclobenzaprine (FLEXERIL) 10 MG tablet TAKE 0.5-1 TABLETS (5-10 MG TOTAL) BY MOUTH 2 (TWO) TIMES DAILY AS NEEDED FOR MUSCLE SPASMS. 09/24/20  Yes Gottschalk, Ashly M, DO  donepezil (ARICEPT) 10 MG tablet TAKE 1 TABLET BY MOUTH EVERY DAY 10/21/20  Yes Gottschalk, Ashly M, DO  DULoxetine (CYMBALTA) 60 MG capsule TAKE 1 CAPSULE BY MOUTH EVERY DAY 10/22/20  Yes Cottle, Steva Ready., MD  fluticasone Aleda Grana)  50 MCG/ACT nasal spray SPRAY 2 SPRAYS INTO EACH NOSTRIL EVERY DAY 02/12/20  Yes Rakes, Doralee Albino, FNP  gabapentin (NEURONTIN) 300 MG capsule TAKE 3 CAPSULES BY MOUTH 3 TIMES A DAY Patient taking differently: Take 300 mg by mouth at bedtime. 09/17/20  Yes Delynn Flavin M, DO  HYDROcodone-acetaminophen (NORCO/VICODIN) 5-325 MG tablet Take 1 tablet by mouth every 6 (six) hours as needed for moderate pain.   Yes [provider]  lamoTRIgine (LAMICTAL) 200 MG tablet TAKE 1 TABLET BY MOUTH EVERY DAY 06/21/20  Yes Cottle, Steva Ready., MD  meclizine (ANTIVERT) 25 MG tablet TAKE 1 TABLET BY MOUTH 3 TIMES DAILY AS NEEDED FOR DIZZINESS. 10/04/20  Yes Gottschalk, Ashly M, DO  nystatin (MYCOSTATIN) 100000 UNIT/ML suspension Take 5 mLs (500,000 Units total) by mouth 4 (four) times daily. x10-14 days for  thrush 10/04/20  Yes Gottschalk, Kathie Rhodes M, DO  nystatin cream (MYCOSTATIN) Apply topically 2 (two) times daily. 10/23/20  Yes Gottschalk, Ashly M, DO  QUEtiapine (SEROQUEL) 200 MG tablet TAKE 1 TABLET BY MOUTH EVERY DAY Patient taking differently: 200 mg. In the middle of the night 02/13/20  Yes Cottle, Steva Ready., MD  QUEtiapine (SEROQUEL) 400 MG tablet TAKE 2 TABLETS (800 MG TOTAL) BY MOUTH AT BEDTIME. 04/08/20  Yes Cottle, Steva Ready., MD  Vitamin D, Ergocalciferol, (DRISDOL) 1.25 MG (50000 UNIT) CAPS capsule Take 1 capsule (50,000 Units total) by mouth every 7 (seven) days. 10/23/20  Yes Gottschalk, Ashly M, DO  albuterol (VENTOLIN HFA) 108 (90 Base) MCG/ACT inhaler TAKE 2 PUFFS BY MOUTH EVERY 6 HOURS AS NEEDED FOR WHEEZE OR SHORTNESS OF BREATH Patient not taking: Reported on 2020-12-22 10/22/20   Raliegh Ip, DO  feeding supplement (BOOST HIGH PROTEIN) LIQD Take 237 mLs by mouth 3 (three) times daily between meals. Patient not taking: Reported on 2020-12-22 10/23/20   Raliegh Ip, DO  midodrine (PROAMATINE) 5 MG tablet Take 1 tablet (5 mg total) by mouth 3 (three) times daily with meals. Patient not taking: Reported on 2020-12-22 11/01/20   Meriam Sprague, MD  Multiple Vitamin (MULTIVITAMIN) LIQD Take 15 mLs by mouth daily. Patient not taking: Reported on 22-Dec-2020 06/22/20   Cleora Fleet, MD  pantoprazole (PROTONIX) 40 MG tablet TAKE 2 TABLETS BY MOUTH EVERY DAY Patient not taking: Reported on 12/22/20 10/21/20   Raliegh Ip, DO  rosuvastatin (CRESTOR) 5 MG tablet Take 1 tablet (5 mg total) by mouth daily. Patient not taking: Reported on 2020-12-22 11/01/20 01/30/21  Meriam Sprague, MD       Cyril Mourning MD. FCCP. [facility] See Amion for pager  If no response to pager , please call [phone]  After 7:00 pm call Elink  [phone]   2020/12/22

## 2020-11-29 NOTE — ED Notes (Signed)
Pt sats 90-91% on RA. Placed pt on 2L [place]. Sats 95% at this time.

## 2020-11-29 NOTE — ED Notes (Signed)
Date and time results received: 12/03/2020 1116 (use smartphrase ".now" to insert current time)  Test: Lactic Acid Critical Value: 3.0 Name of Provider Notified:Dr Zammit Orders Received? Or Actions Taken?: NA

## 2020-11-29 NOTE — ED Triage Notes (Signed)
Pt brought to ED by family. Pt sent by Erin Donaldson for tachycardia, unable to obtain BP, and facial swelling on right side.

## 2020-11-29 NOTE — H&P (Signed)
Patient Demographics:    Erin Donaldson, is a 70 y.o. female  MRN: [id number]   DOB - August 28, 1951  Admit Date - 12/04/2020  Outpatient Primary MD for the patient is Raliegh Ip, DO   Assessment & Plan:    Principal Problem:   Severe sepsis with septic shock [facility]) Active Problems:   Sepsis (HCC)   Pneumonia, community acquired   Acute Rt Paratoid sialoadenitis   H/O traumatic subdural hematoma---07/07/2020   Mood disorder/Anxiety   1)Severe Sepsis with Septic Shock--- POA--due to CAP Pneumonia with superimposed Rt Paratoid Sialoadenitis -WBC 23.4, patient with hypotension, tachycardia and hypothermia -Remained hypotensive in the ED despite IV fluid boluses, hypotension persisted with IV Neo-Synephrine -Switched over to IV Levophed -Continue IV fluids, IV cefepime and vancomycin pending culture -PCCM consult from Dr. Vassie Loll appreciated -Lactic acid improved to 1.9 from 3.0 with IV fluids  2) community-acquired pneumonia--- treat as above #1, bronchodilators and mucolytics as ordered -No significant hypoxia at this time -WBCs 23.4  3)H/o hypotensive episodes with prior syncope and prior traumatic subdural hemorrhage secondary to syncope--- PTA was on midodrine -Check cortisol level -Levophed for pressure support as above #1 -Check echocardiogram  4)Generalized weakness/recurrent falls/ambulatory dysfunction-----worse since subdural hemorrhage back in August 2021--- once more hemodynamically stable will need PT eval may need SNF rehab  5)Social/Ethics--patient is a full code -Unable to reach patient daughter Chasity at 13- [phone] -Left voicemail for patient and sister Ms. Marita Kansas- -was able to reach and speak with patient's sister-in-law Ms. Missy Lendell Caprice-   6) chronic anemia--hemoglobin is 11.9  which is actually higher than recent baseline -Stool is Hemoccult positive, stool was actually brown without signs of active bleeding -Start IV Protonix -Patient is too hemodynamically unstable for endoluminal evaluation -Check iron studies, B12 and folate  7)AKI----acute kidney injury --due to dehydration and hypotension, creatinine 1.23 with a baseline usually between 0.7-0.8 -- renally adjust medications, avoid nephrotoxic agents / dehydration  / hypotension -Continue IV fluids on pressure support   Disposition/Need for in-Hospital Stay- patient unable to be discharged at this time due to --severe sepsis with septic shock requiring IV Levophed for pressure support, IV fluids and IV antibiotics*  Status is: Inpatient  Remains inpatient appropriate because:Please see above   Dispo: The patient is from: Home              Anticipated d/c is to: SNF              Anticipated d/c date is: > 3 days              Patient currently is not medically stable to d/c. Barriers: Not Clinically Stable-    With History of - Reviewed by me  Past Medical History:  Diagnosis Date  . Anemia   . Anxiety   . Bipolar 1 disorder (HCC)   . Bipolar affective (HCC)   . Depression   . Diabetes in pregnancy   .  Diabetes mellitus   . Diabetes mellitus without complication (HCC)   . Elevated LFTs   . GERD (gastroesophageal reflux disease)   . Hyperlipidemia   . Hypertension   . IBS (irritable bowel syndrome)   . Migraine       Past Surgical History:  Procedure Laterality Date  . BURR HOLE [name] 07/24/2020   Procedure: Ines BloomerBurr hole for evacuation of chronic subdural with scalp laceration repair;  Surgeon: Coletta Memosabbell, Kyle, MD;  [facility];  Service: Neurosurgery;  Laterality: [name];  Burr hole for evacuation of chronic subdural with scalp laceration repair  . CHOLECYSTECTOMY    . GASTRIC BYPASS  2005  . SCALP LACERATION REPAIR [name] 07/24/2020   Procedure: SCALP LACERATION REPAIR;  Surgeon: Coletta Memosabbell, Kyle,  MD;  Location: [facility] OR;  Service: Neurosurgery;  Laterality: [name];  SCALP LACERATION REPAIR      Chief Complaint  Patient presents with  . Hypotension      HPI:    Erin Donaldson  is a 70 y.o. female with past medical history relevant for history of prior gastric bypass in 2005 orthostatic problems and prior syncope with hypotension and bradycardia attending subdural hemorrhage back in August 2021 currently on midodrine, as well as history of mood disorder and chronic anemia [facility] after being found to have tachycardia and hypotension as well as right-sided facial swelling/parotid gland enlargement -- In the ED patient was hypothermic requiring Bair hugger, -Patient denies chest pains or pleuritic symptoms -Work-up in the ED reveals pneumonia on chest x-ray, elevated lactic acid at 3.0 on improved to 1.9 after IV fluids,, leukocytosis of 23.4 -CT of the facial nodular areas reveals right parotid sialoadenitis -Patient received IV fluids but remained hypotensive, tachycardic and hypothermic -Received cefepime and Vanco and cultures were obtained -Persistent hypotension necessitated PCCM consult, Dr. Romie JumperAlvar input appreciated  -- Hypotension persisted on Neo-Synephrine patient was switched back to IV Levophed  --Unable to reach patient [place] Chasity [place] [phone]- [phone] -Left voicemail for patient and sister Ms. Marita KansasVernon- -was able to reach and speak with patient's sister-in-law Ms. Missy Lendell CapriceSullivan-   -Hemoglobin in the ED is 11.9 patient had brown stool but it was Hemoccult positive  - creatinine 1.23 with a baseline usually between 0.7-0.8    Review of systems:    In addition to the HPI above,   A full Review of  Systems was done, all other systems reviewed are negative except as noted above in HPI , .    Social History:  Reviewed by me    Social History   Tobacco Use  . Smoking status: Never Smoker  . Smokeless tobacco: Never Used   Substance Use Topics  . Alcohol use: No    Alcohol/week: 0.0 standard drinks    Comment: rare     Family History :  Reviewed by me    Family History  Problem Relation Age of Onset  . Kidney disease Father   . Heart disease Father [id number]       CABG  . Diabetes Father   . CAD Father   . Heart disease Mother 6255       CAD  . Breast cancer Mother   . Cancer Mother        bladder  . Heart disease Maternal Grandfather   . Heart attack Maternal Grandfather   . Stroke Maternal Grandfather   . Heart disease Brother   . Dementia Brother   . Heart disease Maternal Aunt   .  Heart disease Maternal Uncle   . Heart disease Paternal Aunt   . Heart disease Paternal Uncle   . Diabetes Maternal Aunt   . Diabetes Maternal Uncle   . Diabetes Paternal Aunt   . Diabetes Paternal Uncle   . Stroke Maternal Grandmother   . Heart disease Paternal Grandfather   . Diabetes Daughter   . Drug abuse Daughter   . Depression Sister   . Hypertension Sister   . Alcohol abuse Brother   . Drug abuse Brother   . Other Brother        accident caused brain swelling   . Colon cancer Neg Hx      Home Medications:   Prior to Admission medications   Medication Sig Start Date End Date Taking? Authorizing Provider  Aspirin-Caffeine (BC FAST PAIN RELIEF PO) Take 1 tablet by mouth daily as needed.   Yes [provider]  cyclobenzaprine (FLEXERIL) 10 MG tablet TAKE 0.5-1 TABLETS (5-10 MG TOTAL) BY MOUTH 2 (TWO) TIMES DAILY AS NEEDED FOR MUSCLE SPASMS. 09/24/20  Yes Gottschalk, Ashly M, DO  donepezil (ARICEPT) 10 MG tablet TAKE 1 TABLET BY MOUTH EVERY DAY 10/21/20  Yes Gottschalk, Ashly M, DO  DULoxetine (CYMBALTA) 60 MG capsule TAKE 1 CAPSULE BY MOUTH EVERY DAY 10/22/20  Yes Cottle, Steva Readyarey G Jr., MD  fluticasone [facility](FLONASE) 50 MCG/ACT nasal spray SPRAY 2 SPRAYS INTO EACH NOSTRIL EVERY DAY 02/12/20  Yes Rakes, Doralee AlbinoLinda M, FNP  gabapentin (NEURONTIN) 300 MG capsule TAKE 3 CAPSULES BY MOUTH 3 TIMES A DAY Patient  taking differently: Take 300 mg by mouth at bedtime. 09/17/20  Yes Delynn FlavinGottschalk, Ashly M, DO  HYDROcodone-acetaminophen (NORCO/VICODIN) 5-325 MG tablet Take 1 tablet by mouth every 6 (six) hours as needed for moderate pain.   Yes [provider]  lamoTRIgine (LAMICTAL) 200 MG tablet TAKE 1 TABLET BY MOUTH EVERY DAY 06/21/20  Yes Cottle, Steva Readyarey G Jr., MD  meclizine (ANTIVERT) 25 MG tablet TAKE 1 TABLET BY MOUTH 3 TIMES DAILY AS NEEDED FOR DIZZINESS. 10/04/20  Yes Gottschalk, Ashly M, DO  nystatin (MYCOSTATIN) 100000 UNIT/ML suspension Take 5 mLs (500,000 Units total) by mouth 4 (four) times daily. x10-14 days for thrush 10/04/20  Yes Gottschalk, Kathie Rhodesshly M, DO  nystatin cream (MYCOSTATIN) Apply topically 2 (two) times daily. 10/23/20  Yes Gottschalk, Ashly M, DO  QUEtiapine (SEROQUEL) 200 MG tablet TAKE 1 TABLET BY MOUTH EVERY DAY Patient taking differently: 200 mg. In the middle of the night 02/13/20  Yes Cottle, Steva Readyarey G Jr., MD  QUEtiapine (SEROQUEL) 400 MG tablet TAKE 2 TABLETS (800 MG TOTAL) BY MOUTH AT BEDTIME. 04/08/20  Yes Cottle, Steva Readyarey G Jr., MD  Vitamin D, Ergocalciferol, (DRISDOL) 1.25 MG (50000 UNIT) CAPS capsule Take 1 capsule (50,000 Units total) by mouth every 7 (seven) days. 10/23/20  Yes Gottschalk, Ashly M, DO  albuterol (VENTOLIN HFA) 108 (90 Base) MCG/ACT inhaler TAKE 2 PUFFS BY MOUTH EVERY 6 HOURS AS NEEDED FOR WHEEZE OR SHORTNESS OF BREATH Patient not taking: Reported on 11/27/2020 10/22/20   Raliegh IpGottschalk, Ashly M, DO  feeding supplement (BOOST HIGH PROTEIN) LIQD Take 237 mLs by mouth 3 (three) times daily between meals. Patient not taking: Reported on 11/26/2020 10/23/20   Raliegh IpGottschalk, Ashly M, DO  midodrine (PROAMATINE) 5 MG tablet Take 1 tablet (5 mg total) by mouth 3 (three) times daily with meals. Patient not taking: Reported on 11/30/2020 11/01/20   Meriam SpraguePemberton, Heather E, MD  Multiple Vitamin (MULTIVITAMIN) LIQD Take 15 mLs by mouth daily. Patient  not taking: Reported on 11/24/2020  06/22/20   Standley Dakins L, MD  pantoprazole (PROTONIX) 40 MG tablet TAKE 2 TABLETS BY MOUTH EVERY DAY Patient not taking: Reported on 12/01/2020 10/21/20   Raliegh Ip, DO  rosuvastatin (CRESTOR) 5 MG tablet Take 1 tablet (5 mg total) by mouth daily. Patient not taking: Reported on 12/15/2020 11/01/20 01/30/21  Meriam Sprague, MD     Allergies:     Allergies  Allergen Reactions  . Bactrim [Sulfamethoxazole-Trimethoprim]     Raw and irritatated in mouth.  Leodis Liverpool [Propoxyphene N-Acetaminophen]   . Decongestant [Oxymetazoline]   . Elavil [Amitriptyline]   . Lithium Other (See Comments)    Hx toxicity  . Risperidone And Related   . Ultram [Tramadol]   . Cephalosporins Rash and Other (See Comments)    Keflex- Higher dose cause a rash  . Keflex [Cephalexin] Rash  . Omnicef [Cefdinir] Rash     Physical Exam:   Vitals  Blood pressure 109/70, pulse (!) 125, temperature 99.14 F (37.3 C), resp. rate 20, height 5\' 4"  (1.626 m), weight 59.7 kg, SpO2 100 %.  Physical Examination: General appearance - alert, ill appearing, and in no distress  Head/Face- Rt Parotid Gland area swelling, tenderness, warmth-no fluctuance Mental status - alert, oriented to person, place, and time,  Eyes - sclera anicteric Neck - supple, no JVD elevation , Chest -diminished in bases, scattered rhonchi Heart - S1 and S2 normal, irregular and tachycardic  abdomen - soft, nontender, nondistended, no CVA area tenderness Neurological - screening mental status exam normal, neck supple without rigidity, cranial nerves II through XII intact, DTR's normal and symmetric -Patient with generalized weakness but no new focal deficits Extremities - no pedal edema noted, intact peripheral pulses  Skin - warm, dry GU-Foley with somewhat bloody urine     Data Review:    CBC Recent Labs  Lab 12/15/2020 1041  WBC 23.4*  HGB 11.9*  HCT 36.6  PLT 349  MCV 94.1  MCH 30.6  MCHC 32.5  RDW 16.9*   LYMPHSABS 1.2  MONOABS 0.9  EOSABS 0.0  BASOSABS 0.0   ------------------------------------------------------------------------------------------------------------------  Chemistries  Recent Labs  Lab 12/11/2020 1041  NA 135  K 3.7  CL 104  CO2 19*  GLUCOSE 102*  BUN 28*  CREATININE 1.23*  CALCIUM 8.2*  AST 24  ALT 29  ALKPHOS 170*  BILITOT 1.1   ------------------------------------------------------------------------------------------------------------------ estimated creatinine clearance is 38.3 mL/min (A) (by C-G formula based on SCr of 1.23 mg/dL (H)). ------------------------------------------------------------------------------------------------------------------ No results for input(s): TSH, T4TOTAL, T3FREE, THYROIDAB in the last 72 hours.  Invalid input(s): FREET3   Coagulation profile Recent Labs  Lab 12/15/2020 1041  INR 1.0   ------------------------------------------------------------------------------------------------------------------- No results for input(s): DDIMER in the last 72 hours. -------------------------------------------------------------------------------------------------------------------  Cardiac Enzymes No results for input(s): CKMB, TROPONINI, MYOGLOBIN in the last 168 hours.  Invalid input(s): CK ------------------------------------------------------------------------------------------------------------------    Component Value Date/Time   BNP 149.0 (H) 12/06/2020 1040    ---------------------------------------------------------------------------------------------------------------  Urinalysis    Component Value Date/Time   COLORURINE AMBER (A) 11/25/2020 1104   APPEARANCEUR CLEAR 11/22/2020 1104   APPEARANCEUR Clear 06/26/2020 1052   LABSPEC 1.029 11/21/2020 1104   PHURINE 5.0 11/22/2020 1104   GLUCOSEU NEGATIVE 12/09/2020 1104   HGBUR NEGATIVE 11/26/2020 1104   BILIRUBINUR SMALL (A) 11/28/2020 1104   BILIRUBINUR  Negative 06/26/2020 1052   KETONESUR NEGATIVE 11/17/2020 1104   PROTEINUR 100 (A) 11/27/2020 1104   UROBILINOGEN negative 08/20/2015 1607  UROBILINOGEN 1.0 02/24/2012 1821   NITRITE NEGATIVE 11/25/2020 1104   LEUKOCYTESUR NEGATIVE 11/23/2020 1104    ----------------------------------------------------------------------------------------------------------------   Imaging Results:    CT Maxillofacial W Contrast  Result Date: 12/14/2020 CLINICAL DATA:  Right facial swelling EXAM: CT MAXILLOFACIAL WITH CONTRAST TECHNIQUE: Multidetector CT imaging of the maxillofacial structures was performed with intravenous contrast. Multiplanar CT image reconstructions were also generated. CONTRAST:  75mL OMNIPAQUE IOHEXOL 300 MG/ML  SOLN COMPARISON:  None. FINDINGS: Osseous: Temporomandibular joints are unremarkable. Included upper cervical spine is unremarkable. There is no acute osseous abnormality. Orbits: Unremarkable. Sinuses: Mild lobular left maxillary sinus mucosal thickening. Patchy right mastoid opacification. Partial right middle ear opacification. Soft tissues: Right periparotid tissue swelling extending to the level of the submandibular gland. The right parotid is relatively enlarged and heterogeneous. Right submandibular gland is also relatively enlarged and heterogeneous. There are no enlarged lymph nodes. Mild calcified plaque at the common carotid bifurcations. Limited intracranial: No abnormal enhancement. IMPRESSION: Enlargement and heterogeneity of the right parotid and submandibular glands suggesting sialoadenitis. Nonspecific patchy right mastoid and partial right middle ear opacification. Electronically Signed   By: Guadlupe Spanish M.D.   On: 12/13/2020 15:31   DG Chest Port 1 View  Result Date: 11/20/2020 CLINICAL DATA:  Tachycardia.  Possible sepsis. EXAM: PORTABLE CHEST 1 VIEW COMPARISON:  10/11/2020 FINDINGS: Heart size is normal. Chronic aortic atherosclerotic calcification. Slight  worsening of patchy pulmonary densities on both sides suggesting mild patchy pneumonia. No dense consolidation or lobar collapse. No effusion. IMPRESSION: Slight worsening of patchy pulmonary densities on both sides suggesting mild patchy pneumonia. No dense consolidation or lobar collapse. Electronically Signed   By: Paulina Fusi M.D.   On: 12/11/2020 11:11   LONG TERM MONITOR (3-14 DAYS)  Result Date: 12/03/2020 Patch Wear Time:  13 days and 6 hours (2021-12-23T12:24:49-0500 to 2022-01-05T18:31:19-0500) Predominant rhythm was NSR with min HR of 86 bpm, max HR of 167 bpm, and avg HR of 103 bpm. There were 7 episodes of SVT, the run with the fastest interval lasting 6 beats with a max rate of 167 bpm, the longest lasting 13 beats with an avg rate of 140 bpm. Isolated SVEs were rare (<1.0%), SVE Couplets were rare (<1.0%), and SVE Triplets were rare (<1.0%). Isolated VEs were rare (<1.0%), and no VE Couplets or VE Triplets were present. Laurance Flatten, MD    Radiological Exams on Admission: CT Maxillofacial W Contrast  Result Date: 11/25/2020 CLINICAL DATA:  Right facial swelling EXAM: CT MAXILLOFACIAL WITH CONTRAST TECHNIQUE: Multidetector CT imaging of the maxillofacial structures was performed with intravenous contrast. Multiplanar CT image reconstructions were also generated. CONTRAST:  75mL OMNIPAQUE IOHEXOL 300 MG/ML  SOLN COMPARISON:  None. FINDINGS: Osseous: Temporomandibular joints are unremarkable. Included upper cervical spine is unremarkable. There is no acute osseous abnormality. Orbits: Unremarkable. Sinuses: Mild lobular left maxillary sinus mucosal thickening. Patchy right mastoid opacification. Partial right middle ear opacification. Soft tissues: Right periparotid tissue swelling extending to the level of the submandibular gland. The right parotid is relatively enlarged and heterogeneous. Right submandibular gland is also relatively enlarged and heterogeneous. There are no enlarged  lymph nodes. Mild calcified plaque at the common carotid bifurcations. Limited intracranial: No abnormal enhancement. IMPRESSION: Enlargement and heterogeneity of the right parotid and submandibular glands suggesting sialoadenitis. Nonspecific patchy right mastoid and partial right middle ear opacification. Electronically Signed   By: Guadlupe Spanish M.D.   On: 11/30/2020 15:31   DG Chest Port 1 View  Result Date: 11/24/2020 CLINICAL DATA:  Tachycardia.  Possible sepsis. EXAM: PORTABLE CHEST 1 VIEW COMPARISON:  10/11/2020 FINDINGS: Heart size is normal. Chronic aortic atherosclerotic calcification. Slight worsening of patchy pulmonary densities on both sides suggesting mild patchy pneumonia. No dense consolidation or lobar collapse. No effusion. IMPRESSION: Slight worsening of patchy pulmonary densities on both sides suggesting mild patchy pneumonia. No dense consolidation or lobar collapse. Electronically Signed   By: Paulina Fusi M.D.   On: 12/12/2020 11:11   LONG TERM MONITOR (3-14 DAYS)  Result Date: 11/22/2020 Patch Wear Time:  13 days and 6 hours (2021-12-23T12:24:49-0500 to 2022-01-05T18:31:19-0500) Predominant rhythm was NSR with min HR of 86 bpm, max HR of 167 bpm, and avg HR of 103 bpm. There were 7 episodes of SVT, the run with the fastest interval lasting 6 beats with a max rate of 167 bpm, the longest lasting 13 beats with an avg rate of 140 bpm. Isolated SVEs were rare (<1.0%), SVE Couplets were rare (<1.0%), and SVE Triplets were rare (<1.0%). Isolated VEs were rare (<1.0%), and no VE Couplets or VE Triplets were present. Laurance Flatten, MD    DVT Prophylaxis -SCD /heparin AM Labs Ordered, also please review Full Orders  Family Communication: Admission, patients condition and plan of care including tests being ordered have been discussed with the patient and sister in law* who indicate understanding and agree with the plan   Code Status - Full Code  Likely DC to  SNF??  After  resolution of acute issues  Condition  Marinus Maw M.D on 12/11/2020 at 5:22 PM Go to [email or web address] -  for contact info  [facility] - Office  [phone]

## 2020-11-29 NOTE — ED Notes (Signed)
Pt temperature dropping. Pt placed on bair hugger. EDP made aware.

## 2020-11-29 NOTE — Progress Notes (Signed)
Pharmacy Antibiotic Note  Erin Donaldson is a 70 y.o. female admitted on 11/28/2020 with sepsis.  Pharmacy has been consulted for Vancomycin and Cefepime dosing.  Plan: Vancomycin 1000 mg IV x 1 dose. Vancomycin 750 mg IV every 24 hours. Expected AUC 530. Cefepime 2000 mg IV every 12 hours. Monitor labs, c/s, and vanco level as indicated.  Height: 5\' 4"  (162.6 cm) Weight: 54 kg (119 lb) IBW/kg (Calculated) : 54.7  Temp (24hrs), Avg:96.3 F (35.7 C), Min:95.3 F (35.2 C), Max:98.1 F (36.7 C)  Recent Labs  Lab 11/17/2020 1041  WBC 23.4*  CREATININE 1.23*  LATICACIDVEN 3.0*    Estimated Creatinine Clearance: 37.8 mL/min (A) (by C-G formula based on SCr of 1.23 mg/dL (H)).    Allergies  Allergen Reactions  . Bactrim [Sulfamethoxazole-Trimethoprim]     Raw and irritatated in mouth.  12/01/20 [Propoxyphene N-Acetaminophen]   . Decongestant [Oxymetazoline]   . Elavil [Amitriptyline]   . Lithium Other (See Comments)    Hx toxicity  . Risperidone And Related   . Ultram [Tramadol]   . Cephalosporins Rash and Other (See Comments)    Keflex- Higher dose cause a rash  . Keflex [Cephalexin] Rash  . Omnicef [Cefdinir] Rash    Antimicrobials this admission: Vanco 1/14 >>  Cefepime 1/14 >>  Microbiology results: 1/14 BCx: pending 1/14 UCx: pending   Thank you for allowing pharmacy to be a part of this patient's care.  2/14 11/18/2020 1:12 PM

## 2020-11-29 NOTE — ED Provider Notes (Signed)
[facility] EMERGENCY DEPARTMENT Provider Note   CSN: [id number] Arrival date & time: 11/23/2020  1024     History Chief Complaint  Patient presents with  . Hypotension    Erin Donaldson is a 70 y.o. female.  Has been having poor p.o. intake and weakness.  She went over to the doctor's office and her blood pressure was low so she was sent to the emergency department  The history is provided by the patient and medical records. No language interpreter was used.  Weakness Severity:  Moderate Onset quality:  Sudden Timing:  Constant Progression:  Worsening Chronicity:  Recurrent Context: not alcohol use   Relieved by:  Nothing Worsened by:  Nothing Ineffective treatments:  None tried Associated symptoms: no abdominal pain, no chest pain, no cough, no diarrhea, no frequency, no headaches and no seizures        Past Medical History:  Diagnosis Date  . Anemia   . Anxiety   . Bipolar 1 disorder (HCC)   . Bipolar affective (HCC)   . Depression   . Diabetes in pregnancy   . Diabetes mellitus   . Diabetes mellitus without complication (HCC)   . Elevated LFTs   . GERD (gastroesophageal reflux disease)   . Hyperlipidemia   . Hypertension   . IBS (irritable bowel syndrome)   . Migraine     Patient Active Problem List   Diagnosis Date Noted  . Sepsis (HCC) 11/26/2020  . Scalp wound 08/09/2020  . Subdural hematoma, chronic (HCC) 07/24/2020  . Subdural hematoma (HCC) 07/23/2020  . Generalized edema 07/16/2020  . Hospital discharge follow-up 07/16/2020  . Acute subdural hematoma (HCC) 07/07/2020  . Acute pain of right shoulder 07/02/2020  . Rash due to allergy 07/02/2020  . AKI (acute kidney injury) (HCC) 06/20/2020  . Hypokalemia 06/20/2020  . Acute lower UTI 06/20/2020  . Vitamin D deficiency 10/03/2018  . Overweight (BMI 25.0-29.9) 04/26/2018  . Senile purpura (HCC) 04/04/2018  . Abnormal EKG 12/29/2017  . Iron deficiency anemia 01/24/2016  . Swelling of right  lower extremity 07/15/2015  . Right hip pain 07/15/2015  . Microcytic anemia 07/15/2015  . Elevated LFTs   . History of gastric bypass 01/15/2014  . Amnestic disorder due to medical condition (HCC) 08/23/2013  . Diarrhea 07/19/2013  . Bipolar affective disorder in remission (HCC) 08/26/2009  . ANXIETY 08/26/2009  . Migraine headache 08/26/2009  . Hyperlipidemia associated with type 2 diabetes mellitus (HCC) 08/15/2009  . Hyperlipidemia 08/15/2009  . Essential hypertension 08/15/2009  . GERD 08/15/2009  . CONSTIPATION 08/15/2009  . IRRITABLE BOWEL SYNDROME 08/15/2009    Past Surgical History:  Procedure Laterality Date  . BURR HOLE [name] 07/24/2020   Procedure: Ines Bloomer hole for evacuation of chronic subdural with scalp laceration repair;  Surgeon: Coletta Memos, MD;  Location: [facility] OR;  Service: Neurosurgery;  Laterality: [name];  Burr hole for evacuation of chronic subdural with scalp laceration repair  . CHOLECYSTECTOMY    . GASTRIC BYPASS  2005  . SCALP LACERATION REPAIR [name] 07/24/2020   Procedure: SCALP LACERATION REPAIR;  Surgeon: Coletta Memos, MD;  Location: [facility] OR;  Service: Neurosurgery;  Laterality: [name];  SCALP LACERATION REPAIR     OB History   No obstetric history on file.     Family History  Problem Relation Age of Onset  . Kidney disease Father   . Heart disease Father 61       CABG  . Diabetes Father   . CAD Father   .  Heart disease Mother [id number]       CAD  . Breast cancer Mother   . Cancer Mother        bladder  . Heart disease Maternal Grandfather   . Heart attack Maternal Grandfather   . Stroke Maternal Grandfather   . Heart disease Brother   . Dementia Brother   . Heart disease Maternal Aunt   . Heart disease Maternal Uncle   . Heart disease Paternal Aunt   . Heart disease Paternal Uncle   . Diabetes Maternal Aunt   . Diabetes Maternal Uncle   . Diabetes Paternal Aunt   . Diabetes Paternal Uncle   . Stroke Maternal Grandmother   . Heart disease Paternal  Grandfather   . Diabetes Daughter   . Drug abuse Daughter   . Depression Sister   . Hypertension Sister   . Alcohol abuse Brother   . Drug abuse Brother   . Other Brother        accident caused brain swelling   . Colon cancer Neg Hx     Social History   Tobacco Use  . Smoking status: Never Smoker  . Smokeless tobacco: Never Used  Vaping Use  . Vaping Use: Never used  Substance Use Topics  . Alcohol use: No    Alcohol/week: 0.0 standard drinks    Comment: rare  . Drug use: Yes    Types: Mescaline    Home Medications Prior to Admission medications   Medication Sig Start Date End Date Taking? Authorizing Provider  Aspirin-Caffeine (BC FAST PAIN RELIEF PO) Take 1 tablet by mouth daily as needed.   Yes [provider]  cyclobenzaprine (FLEXERIL) 10 MG tablet TAKE 0.5-1 TABLETS (5-10 MG TOTAL) BY MOUTH 2 (TWO) TIMES DAILY AS NEEDED FOR MUSCLE SPASMS. 09/24/20  Yes Gottschalk, Ashly M, DO  donepezil (ARICEPT) 10 MG tablet TAKE 1 TABLET BY MOUTH EVERY DAY 10/21/20  Yes Gottschalk, Ashly M, DO  DULoxetine (CYMBALTA) 60 MG capsule TAKE 1 CAPSULE BY MOUTH EVERY DAY 10/22/20  Yes Cottle, Steva Readyarey G Jr., MD  fluticasone [facility](FLONASE) 50 MCG/ACT nasal spray SPRAY 2 SPRAYS INTO EACH NOSTRIL EVERY DAY 02/12/20  Yes Rakes, Doralee AlbinoLinda M, FNP  gabapentin (NEURONTIN) 300 MG capsule TAKE 3 CAPSULES BY MOUTH 3 TIMES A DAY Patient taking differently: Take 300 mg by mouth at bedtime. 09/17/20  Yes Delynn FlavinGottschalk, Ashly M, DO  HYDROcodone-acetaminophen (NORCO/VICODIN) 5-325 MG tablet Take 1 tablet by mouth every 6 (six) hours as needed for moderate pain.   Yes [provider]  lamoTRIgine (LAMICTAL) 200 MG tablet TAKE 1 TABLET BY MOUTH EVERY DAY 06/21/20  Yes Cottle, Steva Readyarey G Jr., MD  meclizine (ANTIVERT) 25 MG tablet TAKE 1 TABLET BY MOUTH 3 TIMES DAILY AS NEEDED FOR DIZZINESS. 10/04/20  Yes Gottschalk, Ashly M, DO  nystatin (MYCOSTATIN) 100000 UNIT/ML suspension Take 5 mLs (500,000 Units total) by mouth 4  (four) times daily. x10-14 days for thrush 10/04/20  Yes Gottschalk, Kathie Rhodesshly M, DO  nystatin cream (MYCOSTATIN) Apply topically 2 (two) times daily. 10/23/20  Yes Gottschalk, Ashly M, DO  QUEtiapine (SEROQUEL) 200 MG tablet TAKE 1 TABLET BY MOUTH EVERY DAY Patient taking differently: 200 mg. In the middle of the night 02/13/20  Yes Cottle, Steva Readyarey G Jr., MD  QUEtiapine (SEROQUEL) 400 MG tablet TAKE 2 TABLETS (800 MG TOTAL) BY MOUTH AT BEDTIME. 04/08/20  Yes Cottle, Steva Readyarey G Jr., MD  Vitamin D, Ergocalciferol, (DRISDOL) 1.25 MG (50000 UNIT) CAPS capsule Take 1 capsule (50,000 Units  total) by mouth every 7 (seven) days. 10/23/20  Yes Gottschalk, Ashly M, DO  albuterol (VENTOLIN HFA) 108 (90 Base) MCG/ACT inhaler TAKE 2 PUFFS BY MOUTH EVERY 6 HOURS AS NEEDED FOR WHEEZE OR SHORTNESS OF BREATH Patient not taking: Reported on 03-Dec-2020 10/22/20   Raliegh Ip, DO  feeding supplement (BOOST HIGH PROTEIN) LIQD Take 237 mLs by mouth 3 (three) times daily between meals. Patient not taking: Reported on 2020-12-03 10/23/20   Raliegh Ip, DO  midodrine (PROAMATINE) 5 MG tablet Take 1 tablet (5 mg total) by mouth 3 (three) times daily with meals. Patient not taking: Reported on 12/03/20 11/01/20   Meriam Sprague, MD  Multiple Vitamin (MULTIVITAMIN) LIQD Take 15 mLs by mouth daily. Patient not taking: Reported on 2020-12-03 06/22/20   Cleora Fleet, MD  pantoprazole (PROTONIX) 40 MG tablet TAKE 2 TABLETS BY MOUTH EVERY DAY Patient not taking: Reported on 12/03/2020 10/21/20   Raliegh Ip, DO  rosuvastatin (CRESTOR) 5 MG tablet Take 1 tablet (5 mg total) by mouth daily. Patient not taking: Reported on 12/03/20 11/01/20 01/30/21  Meriam Sprague, MD    Allergies    Bactrim [sulfamethoxazole-trimethoprim], Darvocet [propoxyphene n-acetaminophen], Decongestant [oxymetazoline], Elavil [amitriptyline], Lithium, Risperidone and related, Ultram [tramadol], Cephalosporins, Keflex [cephalexin],  and Omnicef [cefdinir]  Review of Systems   Review of Systems  Constitutional: Negative for appetite change and fatigue.  HENT: Negative for congestion, ear discharge and sinus pressure.        Patient swelling  Eyes: Negative for discharge.  Respiratory: Negative for cough.   Cardiovascular: Negative for chest pain.  Gastrointestinal: Negative for abdominal pain and diarrhea.  Genitourinary: Negative for frequency and hematuria.  Musculoskeletal: Negative for back pain.  Skin: Negative for rash.  Neurological: Positive for weakness. Negative for seizures and headaches.  Psychiatric/Behavioral: Negative for hallucinations.    Physical Exam Updated Vital Signs BP 93/61   Pulse (!) 122   Temp (!) 96.3 F (35.7 C)   Resp 16   Ht 5\' 4"  (1.626 m)   Wt 54 kg   SpO2 97%   BMI 20.43 kg/m   Physical Exam Vitals and nursing note reviewed.  Constitutional:      Appearance: She is well-developed.  HENT:     Head: Normocephalic.     Comments: Swelling to both cheeks mildly tender    Nose: Nose normal.  Eyes:     General: No scleral icterus.    Extraocular Movements: EOM normal.     Conjunctiva/sclera: Conjunctivae normal.  Neck:     Thyroid: No thyromegaly.  Cardiovascular:     Rate and Rhythm: Normal rate and regular rhythm.     Heart sounds: No murmur heard. No friction rub. No gallop.   Pulmonary:     Breath sounds: No stridor. No wheezing or rales.  Chest:     Chest wall: No tenderness.  Abdominal:     General: There is no distension.     Tenderness: There is no abdominal tenderness. There is no rebound.  Genitourinary:    Comments: Rectal exam Brown stool heme positive Musculoskeletal:        General: No edema. Normal range of motion.     Cervical back: Neck supple.  Lymphadenopathy:     Cervical: No cervical adenopathy.  Skin:    Findings: No erythema or rash.  Neurological:     Mental Status: She is alert and oriented to person, place, and time.     Motor:  No abnormal muscle tone.     Coordination: Coordination normal.  Psychiatric:        Mood and Affect: Mood and affect normal.        Behavior: Behavior normal.     ED Results / Procedures / Treatments   Labs (all labs ordered are listed, but only abnormal results are displayed) Labs Reviewed  LACTIC ACID, PLASMA - Abnormal; Notable for the following components:      Result Value   Lactic Acid, Venous 3.0 (*)    All other components within normal limits  COMPREHENSIVE METABOLIC PANEL - Abnormal; Notable for the following components:   CO2 19 (*)    Glucose, Bld 102 (*)    BUN 28 (*)    Creatinine, Ser 1.23 (*)    Calcium 8.2 (*)    Total Protein 5.2 (*)    Albumin 1.9 (*)    Alkaline Phosphatase 170 (*)    GFR, Estimated 48 (*)    All other components within normal limits  CBC WITH DIFFERENTIAL/PLATELET - Abnormal; Notable for the following components:   WBC 23.4 (*)    Hemoglobin 11.9 (*)    RDW 16.9 (*)    Neutro Abs 21.3 (*)    All other components within normal limits  APTT - Abnormal; Notable for the following components:   aPTT 38 (*)    All other components within normal limits  URINALYSIS, ROUTINE W REFLEX MICROSCOPIC - Abnormal; Notable for the following components:   Color, Urine AMBER (*)    Bilirubin Urine SMALL (*)    Protein, ur 100 (*)    All other components within normal limits  BRAIN NATRIURETIC PEPTIDE - Abnormal; Notable for the following components:   B Natriuretic Peptide 149.0 (*)    All other components within normal limits  CBG MONITORING, ED - Abnormal; Notable for the following components:   Glucose-Capillary 101 (*)    All other components within normal limits  POC OCCULT BLOOD, ED - Abnormal; Notable for the following components:   Fecal Occult Bld POSITIVE (*)    All other components within normal limits  CULTURE, BLOOD (ROUTINE X 2)  CULTURE, BLOOD (ROUTINE X 2)  RESP PANEL BY RT-PCR (FLU A&B, COVID) ARPGX2  URINE CULTURE   PROTIME-INR  LACTIC ACID, PLASMA  OCCULT BLOOD X 1 CARD TO LAB, STOOL  TYPE AND SCREEN  TROPONIN I (HIGH SENSITIVITY)  TROPONIN I (HIGH SENSITIVITY)    EKG EKG Interpretation  Date/Time:  Friday November 29 2020 10:32:37 EST Ventricular Rate:  129 PR Interval:    QRS Duration: 86 QT Interval:  303 QTC Calculation: 444 R Axis:   90 Text Interpretation: Sinus tachycardia Low voltage with right axis deviation Nonspecific T abnormalities, lateral leads Minimal ST elevation, inferior leads Confirmed by Bethann BerkshireZammit, Neeley Sedivy [phone]([id number]) on August 13, 2021 10:51:43 AM   Radiology DG Chest Port 1 View  Result Date: August 13, 2021 CLINICAL DATA:  Tachycardia.  Possible sepsis. EXAM: PORTABLE CHEST 1 VIEW COMPARISON:  10/11/2020 FINDINGS: Heart size is normal. Chronic aortic atherosclerotic calcification. Slight worsening of patchy pulmonary densities on both sides suggesting mild patchy pneumonia. No dense consolidation or lobar collapse. No effusion. IMPRESSION: Slight worsening of patchy pulmonary densities on both sides suggesting mild patchy pneumonia. No dense consolidation or lobar collapse. Electronically Signed   By: Paulina FusiMark  Shogry M.D.   On: 0September 28, 2022 11:11   LONG TERM MONITOR (3-14 DAYS)  Result Date: August 13, 2021 Patch Wear Time:  13 days and 6 hours (2021-12-23T12:24:49-0500 to 2022-01-05T18:31:19-0500)  Predominant rhythm was NSR with min HR of 86 bpm, max HR of 167 bpm, and avg HR of 103 bpm. There were 7 episodes of SVT, the run with the fastest interval lasting 6 beats with a max rate of 167 bpm, the longest lasting 13 beats with an avg rate of 140 bpm. Isolated SVEs were rare (<1.0%), SVE Couplets were rare (<1.0%), and SVE Triplets were rare (<1.0%). Isolated VEs were rare (<1.0%), and no VE Couplets or VE Triplets were present. Laurance Flatten, MD    Procedures Procedures (including critical care time)  Medications Ordered in ED Medications  EPINEPHrine (EPI-PEN) 0.3 mg/0.3 mL injection (has  no administration in time range)  0.9 %  sodium chloride infusion (250 mLs Intravenous New Bag/Given 12/01/2020 1238)  norepinephrine (LEVOPHED) 4mg  in premix infusion (9 mcg/min Intravenous Rate/Dose Change 12/06/2020 1255)  ceFEPIme (MAXIPIME) 2 g in sodium chloride 0.9 % 100 mL IVPB (has no administration in time range)  vancomycin (VANCOCIN) IVPB 1000 mg/200 mL premix (0 mg Intravenous Stopped 12/01/2020 1229)  sodium chloride 0.9 % bolus 2,000 mL (0 mLs Intravenous Stopped 12/16/2020 1229)  methylPREDNISolone sodium succinate (SOLU-MEDROL) 125 mg/2 mL injection 125 mg (125 mg Intravenous Given 12/12/2020 1057)  EPINEPHrine (EPI-PEN) injection 0.3 mg (0.3 mg Intramuscular Given 12/07/2020 1046)  diphenhydrAMINE (BENADRYL) injection 25 mg (25 mg Intravenous Given 12/16/2020 1057)  ceFEPIme (MAXIPIME) 2 g in sodium chloride 0.9 % 100 mL IVPB (0 g Intravenous Stopped 12/14/2020 1143)  pantoprazole (PROTONIX) injection 40 mg (40 mg Intravenous Given 11/30/2020 1111)  sodium chloride 0.9 % bolus 1,000 mL (1,000 mLs Intravenous New Bag/Given 11/17/2020 1119)  sodium chloride 0.9 % bolus 1,000 mL (1,000 mLs Intravenous New Bag/Given 12/15/2020 1211)   CRITICAL CARE Performed by: 12/01/20 Total critical care time: 55 minutes Critical care time was exclusive of separately billable procedures and treating other patients. Critical care was necessary to treat or prevent imminent or life-threatening deterioration. Critical care was time spent personally by me on the following activities: development of treatment plan with patient and/or surrogate as well as nursing, discussions with consultants, evaluation of patient's response to treatment, examination of patient, obtaining history from patient or surrogate, ordering and performing treatments and interventions, ordering and review of laboratory studies, ordering and review of radiographic studies, pulse oximetry and re-evaluation of patient's condition.  ED Course  I have  reviewed the triage vital signs and the nursing notes.  Pertinent labs & imaging results that were available during my care of the patient were reviewed by me and considered in my medical decision making (see chart for details). I spoke with Dr. Bethann Berkshire critical care and he recommended starting peripheral Levophed.  We will admit the patient to medicine and critical care well consult on the patient   MDM Rules/Calculators/A&P                          Patient with sepsis unknown source hypotension.  Peripheral Levophed started admission to ICU by hospitalist with critical care consult Final Clinical Impression(s) / ED Diagnoses Final diagnoses:  Acute sepsis [facility])    Rx / DC Orders ED Discharge Orders    None       [facility], MD 12/05/2020 1313

## 2020-11-29 NOTE — Progress Notes (Signed)
   Subjective:    Patient ID: Erin Donaldson, female    DOB: 1950-12-07, 70 y.o.   MRN: [id number]   Chief Complaint: Facial Swelling (Right jaw)   HPI Patient comes in today c/o right facial swelling. She work up yesterday morning with face swollen. Tender to touch. No fever. She says that it hurts really bad.   Review of Systems  Constitutional: Positive for fatigue. Negative for chills and fever.  HENT: Positive for facial swelling (right jaw).   Respiratory: Negative.   Cardiovascular: Negative.   Genitourinary: Negative.   Neurological: Negative.   Psychiatric/Behavioral: Negative.   All other systems reviewed and are negative.      Objective:   Physical Exam Vitals and nursing note reviewed.  Constitutional:      General: She is in acute distress.     Appearance: She is ill-appearing.  HENT:     Nose: Nose normal.     Mouth/Throat:     Mouth: Mucous membranes are moist.  Eyes:     Extraocular Movements: Extraocular movements intact.     Pupils: Pupils are equal, round, and reactive to light.  Neck:     Comments: Right facial swelling- tender to touch. Cardiovascular:     Rate and Rhythm: Regular rhythm. Tachycardia present.     Pulses: Normal pulses.     Heart sounds: Normal heart sounds.  Pulmonary:     Breath sounds: Normal breath sounds.  Skin:    General: Skin is warm.  Neurological:     General: No focal deficit present.     Mental Status: She is alert and oriented to person, place, and time.  Psychiatric:        Mood and Affect: Mood normal.        Behavior: Behavior normal.    Pulse (!) 130   Temp (!) 95.3 F (35.2 C)   Ht 5\' 4"  (1.626 m)   SpO2 99%   BMI 20.98 kg/m   Unable  To get blood pressure automatically or manually       Assessment & Plan:  in today with chief complaint of Facial Swelling (Right jaw)   1. Tachycardia 2. Hypotension, unspecified hypotension type 3. Right facial swelling  Needs labs May  need IV antibiotics Daughter willn take her.   The above assessment and management plan was discussed with the patient. The patient verbalized understanding of and has agreed to the management plan. Patient is aware to call the clinic if symptoms persist or worsen. Patient is aware when to return to the clinic for a follow-up visit. Patient educated on when it is appropriate to go to the emergency department.   Mary-Margaret [name], FNP

## 2020-11-30 ENCOUNTER — Inpatient Hospital Stay (HOSPITAL_COMMUNITY): Payer: Medicare Other

## 2020-11-30 DIAGNOSIS — R0602 Shortness of breath: Secondary | ICD-10-CM | POA: Diagnosis not present

## 2020-11-30 LAB — ECHOCARDIOGRAM COMPLETE
Height: 64 in
S' Lateral: 1.95 cm
Single Plane A4C EF: 71.6 %
Weight: 2105.83 oz

## 2020-11-30 LAB — GLUCOSE, CAPILLARY
Glucose-Capillary: 182 mg/dL — ABNORMAL HIGH (ref 70–99)
Glucose-Capillary: 188 mg/dL — ABNORMAL HIGH (ref 70–99)
Glucose-Capillary: 158 mg/dL — ABNORMAL HIGH (ref 70–99)
Glucose-Capillary: 166 mg/dL — ABNORMAL HIGH (ref 70–99)

## 2020-11-30 LAB — TSH: TSH: 1.778 u[IU]/mL (ref 0.350–4.500)

## 2020-11-30 NOTE — Progress Notes (Signed)
  Echocardiogram 2D Echocardiogram has been performed.  Erin Donaldson F 11/30/2020, 1:46 PM

## 2020-11-30 NOTE — Progress Notes (Addendum)
Patient Demographics:    Erin Donaldson, is a 70 y.o. female, DOB - [birth year], ZOX:[id number]  Admit date - 2021/05/30   Admitting Physician Jelisha Weed Mariea ClontsEmokpae, MD  Outpatient Primary MD for the patient is Raliegh IpGottschalk, Ashly M, DO  LOS - 1   Chief Complaint  Patient presents with  . Hypotension        Subjective:    Erin Donaldson today has no fevers, no emesis,  No chest pain,   -- Sister at  bedside, questions answered -Hypotension persist continues to require pressors -Patient wants to eat  Assessment  & Plan :    Principal Problem:   Severe sepsis with septic shock [facility](HCC) Active Problems:   Sepsis (HCC)   Pneumonia, community acquired   Acute Rt Paratoid sialoadenitis   H/O traumatic subdural hematoma---07/07/2020   Mood disorder/Anxiety  Brief Summary:- 70 y.o. female with past medical history relevant for history of prior gastric bypass in 2005 orthostatic problems and prior syncope with hypotension and bradycardia attending subdural hemorrhage back in August 2021 currently on midodrine, as well as history of mood disorder and chronic anemia admitted on 2021/05/30 with severe sepsis with septic shock in setting of pneumonia on Rt Paratoid Gland infection-- after [facility] after being found to have tachycardia and hypotension as well as right-sided facial swelling/parotid gland enlargement  A/p 1)Severe Sepsis with Septic Shock--- POA--due to CAP Pneumonia with superimposed Rt Paratoid Sialoadenitis -WBC 23.4, patient with hypotension, tachycardia and hypothermia -Remained hypotensive in the ED despite IV fluid boluses, hypotension persisted with IV Neo-Synephrine, so added IV Levophed -Continue IV fluids, IV cefepime and vancomycin pending culture -PCCM consult from Dr. Vassie LollAlva appreciated -Lactic acid improved to 1.9 from 3.0 with IV  fluids -Successfully weaned off Levophed on 11/30/2020, -We will attempt to wean off Neo-Synephrine  2) community-acquired pneumonia--- treat as above #1, bronchodilators and mucolytics as ordered -No significant hypoxia at this time -WBCs 23.4 -Management as above #1, -Repeat chest x-ray on 12/01/2020  3)H/o hypotensive episodes with prior syncope and prior traumatic subdural hemorrhage secondary to syncope--- PTA was on midodrine -cortisol level not low -Pressors as above #1 - echocardiogram with preserved EF of 60-65 patient  4)Generalized weakness/recurrent falls/ambulatory dysfunction-----worse since subdural hemorrhage back in August 2021---  --She is too hemodynamically unstable to follow-up for  PT eval at this time -- may need SNF rehab  5)Social/Ethics--patient is a full code -Unable to reach patient daughter Chasity at [phone]- [phone] Discussed with sister Ms. Marita KansasVernon- and  sister-in-law Ms. Missy Lendell CapriceSullivan-   6) chronic anemia--hemoglobin is 11.9 which is actually higher than recent baseline -Stool is Hemoccult positive, stool was actually brown without signs of active bleeding -Start IV Protonix -Patient is too hemodynamically unstable for endoluminal evaluation -Check iron studies, B12 and folate  7)AKI----acute kidney injury --due to dehydration and hypotension, creatinine 1.23 with a baseline usually between 0.7-0.8 -- renally adjust medications, avoid nephrotoxic agents / dehydration  / hypotension -Continue IV fluids on pressure support  CRITICAL CARE Performed by: Shon Haleourage Gal Feldhaus   Total critical care time: 42 minutes  Critical care time was exclusive of separately billable procedures and treating other patients.  Critical care was necessary to treat or prevent imminent  or life-threatening deterioration.  -Hypotension persist continues to require pressors -Able to wean off IV Levophed, continues to require IV Neo-Synephrine  Critical care was time spent  personally by me on the following activities: development of treatment plan with patient and/or surrogate as well as nursing, discussions with consultants, evaluation of patient's response to treatment, examination of patient, obtaining history from patient or surrogate, ordering and performing treatments and interventions, ordering and review of laboratory studies, ordering and review of radiographic studies, pulse oximetry and re-evaluation of patient's condition.    Disposition/Need for in-Hospital Stay- patient unable to be discharged at this time due to --severe sepsis with septic shock requiring IV Levophed for pressure support, IV fluids and IV antibiotics*  Status is: Inpatient  Remains inpatient appropriate because:Please see above   Dispo: The patient is from: Home  Anticipated d/c is to: SNF  Anticipated d/c date is: > 3 days  Patient currently is not medically stable to d/c.  Code Status :  -  Code Status: Full Code   Family Communication:    patient is alert, awake and coherent)   -Discussed with patient and sister and patient's sister-in-law  Consults  :  PCCM  DVT Prophylaxis  :   - SCDs   heparin injection 5,000 Units Start: 12-01-2020 1445 SCDs Start: 12/01/20 1440 Place TED hose Start: 12/01/2020 1440    Lab Results  Component Value Date   PLT 349 12-01-20    Inpatient Medications  Scheduled Meds: . Chlorhexidine Gluconate Cloth  6 each Topical Daily  . donepezil  10 mg Oral Daily  . DULoxetine  60 mg Oral Daily  . feeding supplement  1 Container Oral TID BM  . fluticasone  2 spray Each Nare Daily  . gabapentin  200 mg Oral TID  . heparin  5,000 Units Subcutaneous Q8H  . lamoTRIgine  200 mg Oral Daily  . midodrine  10 mg Oral TID WC  . multivitamin with minerals  1 tablet Oral Daily  . pantoprazole (PROTONIX) IV  40 mg Intravenous Q12H  . polyethylene glycol  17 g Oral Daily  . QUEtiapine  200 mg Oral QHS  .  rosuvastatin  5 mg Oral Daily  . senna-docusate  2 tablet Oral QHS  . sodium chloride flush  3 mL Intravenous Q12H  . sodium chloride flush  3 mL Intravenous Q12H  . Vitamin D (Ergocalciferol)  50,000 Units Oral Q7 days   Continuous Infusions: . sodium chloride Stopped (December 01, 2020 1507)  . sodium chloride    . sodium chloride Stopped (12-01-2020 1507)  . sodium chloride 150 mL/hr at 11/30/20 0811  . sodium chloride Stopped (Dec 01, 2020 1723)  . ceFEPime (MAXIPIME) IV 2 g (11/30/20 1032)  . norepinephrine (LEVOPHED) Adult infusion 6 mcg/min (11/30/20 1616)  . phenylephrine (NEO-SYNEPHRINE) Adult infusion Stopped (11/30/20 1116)  . vancomycin 750 mg (11/30/20 1124)   PRN Meds:.sodium chloride, acetaminophen **OR** acetaminophen, albuterol, alum & mag hydroxide-simeth, bisacodyl, cyclobenzaprine, ondansetron **OR** ondansetron (ZOFRAN) IV, polyethylene glycol, sodium chloride flush, traZODone    Anti-infectives (From admission, onward)   Start     Dose/Rate Route Frequency Ordered Stop   11/30/20 1200  vancomycin (VANCOREADY) IVPB 750 mg/150 mL        750 mg 150 mL/hr over 60 Minutes Intravenous Every 24 hours 12-01-20 1311     2020-12-01 2300  ceFEPIme (MAXIPIME) 2 g in sodium chloride 0.9 % 100 mL IVPB        2 g 200 mL/hr over 30 Minutes  Intravenous Every 12 hours 11-30-20 1300     November 30, 2020 1100  ceFEPIme (MAXIPIME) 2 g in sodium chloride 0.9 % 100 mL IVPB        2 g 200 mL/hr over 30 Minutes Intravenous  Once 11-30-2020 1055 Nov 30, 2020 1143   11-30-20 1045  aztreonam (AZACTAM) 2 g in sodium chloride 0.9 % 100 mL IVPB  Status:  Discontinued        2 g 200 mL/hr over 30 Minutes Intravenous  Once November 30, 2020 1041 30-Nov-2020 1055   2020/11/30 1045  vancomycin (VANCOCIN) IVPB 1000 mg/200 mL premix        1,000 mg 200 mL/hr over 60 Minutes Intravenous  Once 11-30-20 1041 11-30-20 1229        Objective:   Vitals:   11/30/20 1545 11/30/20 1600 11/30/20 1610 11/30/20 1615  BP: (!) 106/53 108/65   109/61  Pulse:  (!) 123 (!) 124 (!) 124  Resp: (!) 29 (!) 35 (!) 22 20  Temp: 99.14 F (37.3 C) 98.96 F (37.2 C) 98.96 F (37.2 C) 98.96 F (37.2 C)  TempSrc:   Bladder   SpO2:  93% 93% 92%  Weight:      Height:        Wt Readings from Last 3 Encounters:  2020/11/30 59.7 kg  11/06/20 55.4 kg  11/01/20 55.8 kg     Intake/Output Summary (Last 24 hours) at 11/30/2020 1625 Last data filed at 11/30/2020 1610 Gross per 24 hour  Intake 3279.25 ml  Output --  Net 3279.25 ml    Physical Exam  Gen:- Awake Alert,  In no apparent distress  HEENT:- .AT, No sclera icterus Neck-Supple Neck,No JVD,.  Lungs-diminished breath sounds, no CV- S1, S2 normal, tachycardic and irregular Abd-  +ve B.Sounds, Abd Soft, No tenderness,    Extremity/Skin:-pitting  Edema of upper and lower extremities, pedal pulses present  Psych-affect is appropriate, oriented x3 Neuro-generalized weakness, no new focal deficits, no tremors GU-Foley with dark urine   Data Review:   Micro Results Recent Results (from the past 240 hour(s))  Blood Culture (routine x 2)     Status: None (Preliminary result)   Collection Time: Nov 30, 2020 10:41 AM   Specimen: BLOOD  Result Value Ref Range Status   Specimen Description BLOOD LEFT ANTECUBITAL  Final   Special Requests   Final    Blood Culture adequate volume BOTTLES DRAWN AEROBIC AND ANAEROBIC   Culture   Final    NO GROWTH < 24 HOURS Performed at [facility], [address], [place], [place] [address]    Report Status PENDING  Incomplete  Resp Panel by RT-PCR (Flu A&B, Covid) Nasopharyngeal Swab     Status: None   Collection Time: 11/30/2020 11:05 AM   Specimen: Nasopharyngeal Swab; Nasopharyngeal(NP) swabs in vial transport medium  Result Value Ref Range Status   SARS Coronavirus 2 by RT PCR NEGATIVE NEGATIVE Final    Comment: (NOTE) SARS-CoV-2 target nucleic acids are NOT DETECTED.  The SARS-CoV-2 RNA is generally detectable in upper respiratory specimens  during the acute phase of infection. The lowest concentration of SARS-CoV-2 viral copies this assay can detect is 138 copies/mL. A negative result does not preclude SARS-Cov-2 infection and should not be used as the sole basis for treatment or other patient management decisions. A negative result may occur with  improper specimen collection/handling, submission of specimen other than nasopharyngeal swab, presence of viral mutation(s) within the areas targeted by this assay, and inadequate number of viral copies(<138 copies/mL).  A negative result must be combined with clinical observations, patient history, and epidemiological information. The expected result is Negative.  Fact Sheet for Patients:  BloggerCourse.com  Fact Sheet for Healthcare Providers:  SeriousBroker.it  This test is no t yet approved or cleared by the [place] FDA and  has been authorized for detection and/or diagnosis of SARS-CoV-2 by FDA under an Emergency Use Authorization (EUA). This EUA will remain  in effect (meaning this test can be used) for the duration of the COVID-19 declaration under Section 564(b)(1) of the Act, 21 U.S.C.section 360bbb-3(b)(1), unless the authorization is terminated  or revoked sooner.       Influenza A by PCR NEGATIVE NEGATIVE Final   Influenza B by PCR NEGATIVE NEGATIVE Final    Comment: (NOTE) The Xpert Xpress SARS-CoV-2/FLU/RSV plus assay is intended as an aid in the diagnosis of influenza from Nasopharyngeal swab specimens and should not be used as a sole basis for treatment. Nasal washings and aspirates are unacceptable for Xpert Xpress SARS-CoV-2/FLU/RSV testing.  Fact Sheet for Patients: BloggerCourse.com  Fact Sheet for Healthcare Providers: SeriousBroker.it  This test is not yet approved or cleared by the [place] FDA and has been authorized for detection  and/or diagnosis of SARS-CoV-2 by FDA under an Emergency Use Authorization (EUA). This EUA will remain in effect (meaning this test can be used) for the duration of the COVID-19 declaration under Section 564(b)(1) of the Act, 21 U.S.C. section 360bbb-3(b)(1), unless the authorization is terminated or revoked.  Performed at [facility], [address] [place], [place] [address]   Blood Culture (routine x 2)     Status: None (Preliminary result)   Collection Time: 11/24/2020 11:07 AM   Specimen: BLOOD  Result Value Ref Range Status   Specimen Description BLOOD RIGHT ANTECUBITAL  Final   Special Requests   Final    Blood Culture adequate volume BOTTLES DRAWN AEROBIC AND ANAEROBIC   Culture   Final    NO GROWTH < 24 HOURS Performed at [facility], [address], [place], [place] [address]    Report Status PENDING  Incomplete    Radiology Reports CT Maxillofacial W Contrast  Result Date: 11/28/2020 CLINICAL DATA:  Right facial swelling EXAM: CT MAXILLOFACIAL WITH CONTRAST TECHNIQUE: Multidetector CT imaging of the maxillofacial structures was performed with intravenous contrast. Multiplanar CT image reconstructions were also generated. CONTRAST:  44mL OMNIPAQUE IOHEXOL 300 MG/ML  SOLN COMPARISON:  None. FINDINGS: Osseous: Temporomandibular joints are unremarkable. Included upper cervical spine is unremarkable. There is no acute osseous abnormality. Orbits: Unremarkable. Sinuses: Mild lobular left maxillary sinus mucosal thickening. Patchy right mastoid opacification. Partial right middle ear opacification. Soft tissues: Right periparotid tissue swelling extending to the level of the submandibular gland. The right parotid is relatively enlarged and heterogeneous. Right submandibular gland is also relatively enlarged and heterogeneous. There are no enlarged lymph nodes. Mild calcified plaque at the common carotid bifurcations. Limited intracranial: No abnormal enhancement. IMPRESSION: Enlargement  and heterogeneity of the right parotid and submandibular glands suggesting sialoadenitis. Nonspecific patchy right mastoid and partial right middle ear opacification. Electronically Signed   By: Guadlupe Spanish M.D.   On: 11/30/2020 15:31   DG Chest Port 1 View  Result Date: 11/27/2020 CLINICAL DATA:  Tachycardia.  Possible sepsis. EXAM: PORTABLE CHEST 1 VIEW COMPARISON:  10/11/2020 FINDINGS: Heart size is normal. Chronic aortic atherosclerotic calcification. Slight worsening of patchy pulmonary densities on both sides suggesting mild patchy pneumonia. No dense consolidation or lobar collapse. No effusion. IMPRESSION:  Slight worsening of patchy pulmonary densities on both sides suggesting mild patchy pneumonia. No dense consolidation or lobar collapse. Electronically Signed   By: Paulina FusiMark  Shogry M.D.   On: 12/03/2020 11:11   ECHOCARDIOGRAM COMPLETE  Result Date: 11/30/2020    ECHOCARDIOGRAM REPORT   Patient Name:   Adelfa KohLYNN B Donaldson Date of Exam: 11/30/2020 Medical Rec #:  [id number]       Height:       64.0 in Accession #:    [id number]      Weight:       131.6 lb Date of Birth:  Sep 17, 1951       BSA:          1.638 m Patient Age:    [age] years        BP:           117/66 mmHg Patient Gender: F               HR:           123 bpm. [facility] Procedure: 2D Echo, Color Doppler and Cardiac Doppler Indications:    R06.02 SOB  History:        Patient has no prior history of Echocardiogram examinations.  Sonographer:    Roosvelt Maserachel Lane Referring Phys: [id number] Matheus Spiker IMPRESSIONS  1. Left ventricular ejection fraction, by estimation, is 60 to 65%. The left ventricle has normal function. The left ventricle has no regional wall motion abnormalities. Left ventricular diastolic parameters are indeterminate.  2. Right ventricular systolic function is normal. The right ventricular size is normal.  3. The mitral valve is normal in structure. No evidence of mitral valve regurgitation. No evidence of mitral  stenosis.  4. The aortic valve is tricuspid. Aortic valve regurgitation is not visualized. Mild aortic valve sclerosis is present, with no evidence of aortic valve stenosis.  5. The inferior vena cava is normal in size with greater than 50% respiratory variability, suggesting right atrial pressure of 3 mmHg. FINDINGS  Left Ventricle: Left ventricular ejection fraction, by estimation, is 60 to 65%. The left ventricle has normal function. The left ventricle has no regional wall motion abnormalities. The left ventricular internal cavity size was normal in size. There is  no left ventricular hypertrophy. Left ventricular diastolic parameters are indeterminate. Right Ventricle: The right ventricular size is normal. Right ventricular systolic function is normal. Left Atrium: Left atrial size was normal in size. Right Atrium: Right atrial size was normal in size. Pericardium: Trivial pericardial effusion is present. Mitral Valve: The mitral valve is normal in structure. No evidence of mitral valve regurgitation. No evidence of mitral valve stenosis. Tricuspid Valve: The tricuspid valve is normal in structure. Tricuspid valve regurgitation is trivial. No evidence of tricuspid stenosis. Aortic Valve: The aortic valve is tricuspid. Aortic valve regurgitation is not visualized. Mild aortic valve sclerosis is present, with no evidence of aortic valve stenosis. Pulmonic Valve: The pulmonic valve was normal in structure. Pulmonic valve regurgitation is not visualized. No evidence of pulmonic stenosis. Aorta: The aortic root is normal in size and structure. Venous: The inferior vena cava is normal in size with greater than 50% respiratory variability, suggesting right atrial pressure of 3 mmHg.   LEFT VENTRICLE PLAX 2D LVIDd:         2.75 cm LVIDs:         1.95 cm LV PW:         0.86 cm LV IVS:  0.92 cm LVOT diam:     1.80 cm LVOT Area:     2.54 cm  LV Volumes (MOD) LV vol d, MOD A4C: 49.3 ml LV vol s, MOD A4C: 14.0 ml LV  SV MOD A4C:     49.3 ml LEFT ATRIUM             Index LA diam:        2.90 cm 1.77 cm/m LA Vol (A2C):   30.6 ml 18.69 ml/m LA Vol (A4C):   43.4 ml 26.50 ml/m LA Biplane Vol: 38.7 ml 23.63 ml/m   AORTA Ao Root diam: 2.80 cm  SHUNTS Systemic Diam: 1.80 cm Olga Millers MD Electronically signed by Olga Millers MD Signature Date/Time: 11/30/2020/2:55:22 PM    Final    LONG TERM MONITOR (3-14 DAYS)  Result Date: 11/17/2020 Patch Wear Time:  13 days and 6 hours (2021-12-23T12:24:49-0500 to 2022-01-05T18:31:19-0500) Predominant rhythm was NSR with min HR of 86 bpm, max HR of 167 bpm, and avg HR of 103 bpm. There were 7 episodes of SVT, the run with the fastest interval lasting 6 beats with a max rate of 167 bpm, the longest lasting 13 beats with an avg rate of 140 bpm. Isolated SVEs were rare (<1.0%), SVE Couplets were rare (<1.0%), and SVE Triplets were rare (<1.0%). Isolated VEs were rare (<1.0%), and no VE Couplets or VE Triplets were present. Laurance Flatten, MD     CBC Recent Labs  Lab 11/20/2020 1041  WBC 23.4*  HGB 11.9*  HCT 36.6  PLT 349  MCV 94.1  MCH 30.6  MCHC 32.5  RDW 16.9*  LYMPHSABS 1.2  MONOABS 0.9  EOSABS 0.0  BASOSABS 0.0    Chemistries  Recent Labs  Lab 12/12/2020 1041  NA 135  K 3.7  CL 104  CO2 19*  GLUCOSE 102*  BUN 28*  CREATININE 1.23*  CALCIUM 8.2*  AST 24  ALT 29  ALKPHOS 170*  BILITOT 1.1   ------------------------------------------------------------------------------------------------------------------ No results for input(s): CHOL, HDL, LDLCALC, TRIG, CHOLHDL, LDLDIRECT in the last 72 hours.  Lab Results  Component Value Date   HGBA1C 6.5 (H) 07/24/2020   ------------------------------------------------------------------------------------------------------------------ Recent Labs    11/30/20 0104  TSH 1.778   ------------------------------------------------------------------------------------------------------------------ No results  for input(s): VITAMINB12, FOLATE, FERRITIN, TIBC, IRON, RETICCTPCT in the last 72 hours.  Coagulation profile Recent Labs  Lab 12/14/2020 1041  INR 1.0    No results for input(s): DDIMER in the last 72 hours.  Cardiac Enzymes No results for input(s): CKMB, TROPONINI, MYOGLOBIN in the last 168 hours.  Invalid input(s): CK ------------------------------------------------------------------------------------------------------------------    Component Value Date/Time   BNP 149.0 (H) 11/17/2020 1040     Shon Hale M.D on 11/30/2020 at 4:25 PM  Go to [email or web address] - for contact info  [facility] - Office  [phone]

## 2020-12-01 ENCOUNTER — Inpatient Hospital Stay (HOSPITAL_COMMUNITY): Payer: Medicare Other

## 2020-12-01 ENCOUNTER — Inpatient Hospital Stay: Payer: Self-pay

## 2020-12-01 DIAGNOSIS — L8990 Pressure ulcer of unspecified site, unspecified stage: Secondary | ICD-10-CM | POA: Insufficient documentation

## 2020-12-01 LAB — RENAL FUNCTION PANEL
Albumin: 1.5 g/dL — ABNORMAL LOW (ref 3.5–5.0)
Anion gap: 8 (ref 5–15)
BUN: 22 mg/dL (ref 8–23)
CO2: 15 mmol/L — ABNORMAL LOW (ref 22–32)
Calcium: 7.1 mg/dL — ABNORMAL LOW (ref 8.9–10.3)
Chloride: 109 mmol/L (ref 98–111)
Creatinine, Ser: 0.95 mg/dL (ref 0.44–1.00)
GFR, Estimated: >60 mL/min (ref >60)
Glucose, Bld: 155 mg/dL — ABNORMAL HIGH (ref 70–99)
Phosphorus: 2 mg/dL — ABNORMAL LOW (ref 2.5–4.6)
Potassium: 3.2 mmol/L — ABNORMAL LOW (ref 3.5–5.1)
Sodium: 132 mmol/L — ABNORMAL LOW (ref 135–145)

## 2020-12-01 LAB — BLOOD GAS, ARTERIAL
Acid-base deficit: 11 mmol/L — ABNORMAL HIGH (ref 0.0–2.0)
Acid-base deficit: 16.8 mmol/L — ABNORMAL HIGH (ref 0.0–2.0)
Bicarbonate: 16.3 mmol/L — ABNORMAL LOW (ref 20.0–28.0)
Bicarbonate: 11.5 mmol/L — ABNORMAL LOW (ref 20.0–28.0)
FIO2: 45
FIO2: 100
O2 Saturation: 95.2 %
O2 Saturation: 98.6 %
Patient temperature: 36.9
Patient temperature: 36.8
pCO2 arterial: 23.6 mmHg — ABNORMAL LOW (ref 32.0–48.0)
pCO2 arterial: 32.3 mmHg (ref 32.0–48.0)
pH, Arterial: 7.37 (ref 7.350–7.450)
pH, Arterial: 7.137 — CL (ref 7.350–7.450)
pO2, Arterial: 81.7 mmHg — ABNORMAL LOW (ref 83.0–108.0)
pO2, Arterial: 304 mmHg — ABNORMAL HIGH (ref 83.0–108.0)

## 2020-12-01 LAB — CBC
HCT: 35.5 % — ABNORMAL LOW (ref 36.0–46.0)
HCT: 30.5 % — ABNORMAL LOW (ref 36.0–46.0)
Hemoglobin: 11.3 g/dL — ABNORMAL LOW (ref 12.0–15.0)
Hemoglobin: 9.6 g/dL — ABNORMAL LOW (ref 12.0–15.0)
MCH: 30.7 pg (ref 26.0–34.0)
MCH: 31.2 pg (ref 26.0–34.0)
MCHC: 31.8 g/dL (ref 30.0–36.0)
MCHC: 31.5 g/dL (ref 30.0–36.0)
MCV: 96.5 fL (ref 80.0–100.0)
MCV: 99 fL (ref 80.0–100.0)
Platelets: 298 10*3/uL (ref 150–400)
Platelets: 381 10*3/uL (ref 150–400)
RBC: 3.68 MIL/uL — ABNORMAL LOW (ref 3.87–5.11)
RBC: 3.08 MIL/uL — ABNORMAL LOW (ref 3.87–5.11)
RDW: 17.3 % — ABNORMAL HIGH (ref 11.5–15.5)
RDW: 17.6 % — ABNORMAL HIGH (ref 11.5–15.5)
WBC: 11 10*3/uL — ABNORMAL HIGH (ref 4.0–10.5)
WBC: 18.8 10*3/uL — ABNORMAL HIGH (ref 4.0–10.5)
nRBC: 0 % (ref 0.0–0.2)
nRBC: 0.2 % (ref 0.0–0.2)

## 2020-12-01 LAB — MAGNESIUM: Magnesium: 1.3 mg/dL — ABNORMAL LOW (ref 1.7–2.4)

## 2020-12-01 LAB — COMPREHENSIVE METABOLIC PANEL
ALT: 26 U/L (ref 0–44)
AST: 37 U/L (ref 15–41)
Albumin: 1.5 g/dL — ABNORMAL LOW (ref 3.5–5.0)
Alkaline Phosphatase: 215 U/L — ABNORMAL HIGH (ref 38–126)
Anion gap: 6 (ref 5–15)
BUN: 22 mg/dL (ref 8–23)
CO2: 15 mmol/L — ABNORMAL LOW (ref 22–32)
Calcium: 6.9 mg/dL — ABNORMAL LOW (ref 8.9–10.3)
Chloride: 110 mmol/L (ref 98–111)
Creatinine, Ser: 1.03 mg/dL — ABNORMAL HIGH (ref 0.44–1.00)
GFR, Estimated: 60 mL/min — ABNORMAL LOW (ref >60)
Glucose, Bld: 164 mg/dL — ABNORMAL HIGH (ref 70–99)
Potassium: 3.4 mmol/L — ABNORMAL LOW (ref 3.5–5.1)
Sodium: 131 mmol/L — ABNORMAL LOW (ref 135–145)
Total Bilirubin: 1 mg/dL (ref 0.3–1.2)
Total Protein: 4.3 g/dL — ABNORMAL LOW (ref 6.5–8.1)

## 2020-12-01 LAB — GLUCOSE, CAPILLARY
Glucose-Capillary: 152 mg/dL — ABNORMAL HIGH (ref 70–99)
Glucose-Capillary: 171 mg/dL — ABNORMAL HIGH (ref 70–99)

## 2020-12-01 LAB — MRSA PCR SCREENING: MRSA by PCR: POSITIVE — AB

## 2020-12-01 MED ORDER — FUROSEMIDE 10 MG/ML IJ SOLN
20.0000 mg | Freq: Once | INTRAMUSCULAR | Status: AC
  Administered 2020-12-01: 20 mg via INTRAVENOUS
  Filled 2020-12-01: qty 2

## 2020-12-01 MED ORDER — NOREPINEPHRINE 16 MG/250ML-% IV SOLN
0.0000 ug/min | INTRAVENOUS | Status: DC
  Administered 2020-12-01: 36 ug/min via INTRAVENOUS
  Administered 2020-12-02: 40 ug/min via INTRAVENOUS
  Filled 2020-12-01 (×2): qty 250

## 2020-12-01 MED ORDER — NOREPINEPHRINE 4 MG/250ML-% IV SOLN
0.0000 ug/min | INTRAVENOUS | Status: DC
  Administered 2020-12-01: 30 ug/min via INTRAVENOUS
  Filled 2020-12-01: qty 500

## 2020-12-01 MED ORDER — SODIUM BICARBONATE 8.4 % IV SOLN
50.0000 meq | INTRAVENOUS | Status: AC
  Administered 2020-12-01 – 2020-12-02 (×3): 50 meq via INTRAVENOUS

## 2020-12-01 MED ORDER — SODIUM BICARBONATE 8.4 % IV SOLN
INTRAVENOUS | Status: AC
  Filled 2020-12-01: qty 150

## 2020-12-01 MED ORDER — SODIUM CHLORIDE 0.9% FLUSH
10.0000 mL | INTRAVENOUS | Status: DC | PRN
Start: 2020-12-01 — End: 2020-12-03

## 2020-12-01 MED ORDER — POTASSIUM CHLORIDE 10 MEQ/100ML IV SOLN
10.0000 meq | INTRAVENOUS | Status: AC
  Administered 2020-12-01 (×4): 10 meq via INTRAVENOUS
  Filled 2020-12-01 (×4): qty 100

## 2020-12-01 MED ORDER — MAGNESIUM SULFATE 2 GM/50ML IV SOLN
2.0000 g | Freq: Once | INTRAVENOUS | Status: AC
  Administered 2020-12-01: 2 g via INTRAVENOUS
  Filled 2020-12-01: qty 50

## 2020-12-01 MED ORDER — VASOPRESSIN 20 UNITS/100 ML INFUSION FOR SHOCK
INTRAVENOUS | Status: AC
  Filled 2020-12-01: qty 100

## 2020-12-01 MED ORDER — VASOPRESSIN 20 UNITS/100 ML INFUSION FOR SHOCK
0.0000 [IU]/min | INTRAVENOUS | Status: DC
  Administered 2020-12-02: 0.03 [IU]/min via INTRAVENOUS
  Filled 2020-12-01 (×2): qty 100

## 2020-12-01 MED ORDER — SODIUM CHLORIDE 0.9% FLUSH
10.0000 mL | Freq: Two times a day (BID) | INTRAVENOUS | Status: DC
  Administered 2020-12-01 – 2020-12-02 (×2): 10 mL

## 2020-12-01 NOTE — Progress Notes (Signed)
RN called due to poor urinary output. Patient has received 10 L of IV NS since admission and only 250 mL was recorded during this shift with pending urinary output during this shift (CMP was done and showed improved creatinine at 1.03 from 1.23). She was also reported to be requiring more oxygen, chest x-ray was ordered and showed worsening bilateral hazy airspace opacities with a probable small right sided pleural effusion. IV hydration was temporarily held, IV Lasix 20 mg x 1 and was given. Patient was placed on BiPAP. ABG was ordered and pending.

## 2020-12-01 NOTE — Progress Notes (Signed)
RN called due to patient's BP at 77/51 with a MAP of 57 despite maxing out on IV Levophed.  A second pressor (vasopressin) was started.

## 2020-12-01 NOTE — Progress Notes (Signed)
Patient Demographics:    Erin Donaldson, is a 70 y.o. female, DOB - 26-Oct-1951, ZOX:[id number]  Admit date - 12/15/2020   Admitting Physician Lesleyann Fichter Mariea ClontsEmokpae, MD  Outpatient Primary MD for the patient is Raliegh IpGottschalk, Ashly M, DO  LOS - 2   Chief Complaint  Patient presents with  . Hypotension        Subjective:    Erin Donaldson today has no fevers, no emesis,  No chest pain,   hypotension despite IV Levophed drip -Hypoxic respiratory distress persist with BiPAP requirement -Poor urine output noted  Assessment  & Plan :    Principal Problem:   Severe sepsis with septic shock [facility](HCC) Active Problems:   Sepsis (HCC)   Pneumonia, community acquired   Acute Rt Paratoid sialoadenitis   H/O traumatic subdural hematoma---07/07/2020   Mood disorder/Anxiety   Pressure injury of skin  Brief Summary:- 70 y.o. female with past medical history relevant for history of prior gastric bypass in 2005 orthostatic problems and prior syncope with hypotension and bradycardia attending subdural hemorrhage back in August 2021 currently on midodrine, as well as history of mood disorder and chronic anemia admitted on 12/08/2020 with severe sepsis with septic shock in setting of pneumonia on Rt Paratoid Gland infection-- after [facility] after being found to have tachycardia and hypotension as well as right-sided facial swelling/parotid gland enlargement  A/p 1)Severe Sepsis with Septic Shock--- POA--due to CAP Pneumonia with superimposed Rt Paratoid Sialoadenitis -patient with hypotension, tachycardia and hypothermia and leukocytosis -WBC is down to 11.0 from 23.4 -Remained hypotensive in the ED despite IV fluid boluses, hypotension persisted with IV Neo-Synephrine, so added IV Levophed -Continue IV cefepime and vancomycin pending culture -PCCM consult from Dr. Vassie LollAlva  appreciated -Lactic acid improved to 1.9 from 3.0 with IV fluids -Unable to wean off Levophed -PICC line placed 12/01/2020 -Neo-Synephrine discontinued  2) community-acquired pneumonia--- treat as above #1, bronchodilators and mucolytics as ordered -Hypoxic respiratory failure noted requiring -WBCs down to 11.0 from 23.4 -Management as above #1, -Repeat chest x-ray on 12/01/2020--- consistent with bilateral pneumonia  3)H/o hypotensive episodes with prior syncope and prior traumatic subdural hemorrhage secondary to syncope--- PTA was on midodrine -cortisol level not low -Pressors as above #1 - echocardiogram with preserved EF of 60-65 %  4)Generalized weakness/recurrent falls/ambulatory dysfunction-----worse since subdural hemorrhage back in August 2021---  --She is too hemodynamically unstable to follow-up for  PT eval at this time -- may need SNF rehab  5)Social/Ethics--patient is a full code -Unable to reach patient daughter [place]asity at [phone]- [phone] Discussed with sister Ms. Marita KansasVernon- and  sister-in-law Ms. Missy Lendell CapriceSullivan-   6) chronic anemia--hemoglobin is 11.3 which is actually higher than recent baseline -Stool is Hemoccult positive, stool was actually brown without signs of active bleeding -Continue IV Protonix -Patient is too hemodynamically unstable for endoluminal evaluation -Check iron studies, B12 and folate  7)AKI----acute kidney injury --due to dehydration and hypotension, creatinine down to 0.95 from 1.23  - baseline usually between 0.7-0.8 -- renally adjust medications, avoid nephrotoxic agents / dehydration  / hypotension - 8)Hypokalemia and hypomagnesemia--- replace and recheck  CRITICAL CARE Performed by: Shon Haleourage Tish Begin   Total critical care time: 41 minutes  Critical care time  was exclusive of separately billable procedures and treating other patients.  Critical care was necessary to treat or prevent imminent or life-threatening  deterioration.  -Hypotension persist continues to require pressors -Requiring Levophed for pressure -Requiring continuous BiPAP for acute hypoxic respiratory failure  Critical care was time spent personally by me on the following activities: development of treatment plan with patient and/or surrogate as well as nursing, discussions with consultants, evaluation of patient's response to treatment, examination of patient, obtaining history from patient or surrogate, ordering and performing treatments and interventions, ordering and review of laboratory studies, ordering and review of radiographic studies, pulse oximetry and re-evaluation of patient's condition.    Disposition/Need for in-Hospital Stay- patient unable to be discharged at this time due to --severe sepsis with septic shock requiring IV Levophed for pressure support,  and IV antibiotics*  Status is: Inpatient  Remains inpatient appropriate because:Please see above   Dispo: The patient is from: Home  Anticipated d/c is to: SNF  Anticipated d/c date is: > 3 days  Patient currently is not medically stable to d/c.  Code Status :  -  Code Status: Full Code   Family Communication:    patient is alert, awake and coherent)   -Discussed with patient and sister and patient's sister-in-law  Consults  :  PCCM  DVT Prophylaxis  :   - SCDs   heparin injection 5,000 Units Start: 2020-12-08 1445 SCDs Start: 12/08/20 1440 Place TED hose Start: 08-Dec-2020 1440    Lab Results  Component Value Date   PLT 298 12/01/2020    Inpatient Medications  Scheduled Meds: . Chlorhexidine Gluconate Cloth  6 each Topical Daily  . donepezil  10 mg Oral Daily  . DULoxetine  60 mg Oral Daily  . feeding supplement  1 Container Oral TID BM  . fluticasone  2 spray Each Nare Daily  . furosemide  20 mg Intravenous Once  . gabapentin  200 mg Oral TID  . heparin  5,000 Units Subcutaneous Q8H  . lamoTRIgine  200  mg Oral Daily  . midodrine  10 mg Oral TID WC  . multivitamin with minerals  1 tablet Oral Daily  . pantoprazole (PROTONIX) IV  40 mg Intravenous Q12H  . polyethylene glycol  17 g Oral Daily  . QUEtiapine  200 mg Oral QHS  . rosuvastatin  5 mg Oral Daily  . senna-docusate  2 tablet Oral QHS  . sodium chloride flush  10-40 mL Intracatheter Q12H  . sodium chloride flush  3 mL Intravenous Q12H  . sodium chloride flush  3 mL Intravenous Q12H  . Vitamin D (Ergocalciferol)  50,000 Units Oral Q7 days   Continuous Infusions: . sodium chloride Stopped (12-08-2020 1507)  . sodium chloride    . sodium chloride Stopped (2020-12-08 1507)  . sodium chloride Stopped (12/01/20 1249)  . sodium chloride Stopped (08-Dec-2020 1723)  . ceFEPime (MAXIPIME) IV Stopped (12/01/20 1059)  . magnesium sulfate bolus IVPB    . norepinephrine (LEVOPHED) Adult infusion    . potassium chloride    . vancomycin Stopped (12/01/20 1246)   PRN Meds:.sodium chloride, acetaminophen **OR** acetaminophen, albuterol, alum & mag hydroxide-simeth, bisacodyl, cyclobenzaprine, ondansetron **OR** ondansetron (ZOFRAN) IV, polyethylene glycol, sodium chloride flush, sodium chloride flush, traZODone    Anti-infectives (From admission, onward)   Start     Dose/Rate Route Frequency Ordered Stop   11/30/20 1200  vancomycin (VANCOREADY) IVPB 750 mg/150 mL        750 mg 150 mL/hr over 60  Minutes Intravenous Every 24 hours 2020-12-19 1311     12-19-2020 2300  ceFEPIme (MAXIPIME) 2 g in sodium chloride 0.9 % 100 mL IVPB        2 g 200 mL/hr over 30 Minutes Intravenous Every 12 hours 12-19-20 1300     December 19, 2020 1100  ceFEPIme (MAXIPIME) 2 g in sodium chloride 0.9 % 100 mL IVPB        2 g 200 mL/hr over 30 Minutes Intravenous  Once 12/19/20 1055 12-19-2020 1143   12/19/2020 1045  aztreonam (AZACTAM) 2 g in sodium chloride 0.9 % 100 mL IVPB  Status:  Discontinued        2 g 200 mL/hr over 30 Minutes Intravenous  Once 12/19/20 1041 19-Dec-2020 1055    12-19-2020 1045  vancomycin (VANCOCIN) IVPB 1000 mg/200 mL premix        1,000 mg 200 mL/hr over 60 Minutes Intravenous  Once 2020-12-19 1041 Dec 19, 2020 1229        Objective:   Vitals:   12/01/20 1615 12/01/20 1628 12/01/20 1800 12/01/20 1815  BP: (!) 68/44 (!) 76/54 (!) 81/51 (!) 70/43  Pulse:   (!) 123   Resp: (!) 30 (!) 43 (!) 42 (!) 43  Temp: 98.06 F (36.7 C) 98.06 F (36.7 C) 98.06 F (36.7 C) 98.24 F (36.8 C)  TempSrc:      SpO2:   91%   Weight:      Height:        Wt Readings from Last 3 Encounters:  2020-12-19 59.7 kg  11/06/20 55.4 kg  11/01/20 55.8 kg     Intake/Output Summary (Last 24 hours) at 12/01/2020 1816 Last data filed at 12/01/2020 1346 Gross per 24 hour  Intake 2116.67 ml  Output 425 ml  Net 1691.67 ml    Physical Exam  Gen:- Awake Alert,  In no apparent distress  HEENT:- [place].AT, No sclera icterus Nose/Mouth--BiPAP mask Neck-Supple Neck,No JVD,.  Lungs-diminished breath sounds, scattered rhonchi CV- S1, S2 normal, tachycardic and irregular Abd-  +ve B.Sounds, Abd Soft, No tenderness,    Extremity/Skin:-pitting  Edema of upper and lower extremities, pedal pulses present  Psych-affect is appropriate, oriented x3 Neuro-generalized weakness, no new focal deficits, no tremors GU-Foley with dark urine   Data Review:   Micro Results Recent Results (from the past 240 hour(s))  Blood Culture (routine x 2)     Status: None (Preliminary result)   Collection Time: 12/19/20 10:41 AM   Specimen: BLOOD  Result Value Ref Range Status   Specimen Description BLOOD LEFT ANTECUBITAL  Final   Special Requests   Final    Blood Culture adequate volume BOTTLES DRAWN AEROBIC AND ANAEROBIC   Culture   Final    NO GROWTH 2 DAYS Performed at [facility], [address] [place], [place] [address]    Report Status PENDING  Incomplete  Urine culture     Status: Abnormal (Preliminary result)   Collection Time: 12/19/2020 11:04 AM   Specimen: In/Out Cath Urine   Result Value Ref Range Status   Specimen Description   Final    IN/OUT CATH URINE Performed at [facility], [address] [place], [place] [address]    Special Requests   Final    NONE Performed at [facility], [address] [place], [place] [address]    Culture (A)  Final    30,000 COLONIES/mL ESCHERICHIA COLI SUSCEPTIBILITIES TO FOLLOW Performed at [facility], [address] [place], [place] [address]  Report Status PENDING  Incomplete  Resp Panel by RT-PCR (Flu A&B, Covid) Nasopharyngeal Swab     Status: None   Collection Time: 11/17/2020 11:05 AM   Specimen: Nasopharyngeal Swab; Nasopharyngeal(NP) swabs in vial transport medium  Result Value Ref Range Status   SARS Coronavirus 2 by RT PCR NEGATIVE NEGATIVE Final    Comment: (NOTE) SARS-CoV-2 target nucleic acids are NOT DETECTED.  The SARS-CoV-2 RNA is generally detectable in upper respiratory specimens during the acute phase of infection. The lowest concentration of SARS-CoV-2 viral copies this assay can detect is 138 copies/mL. A negative result does not preclude SARS-Cov-2 infection and should not be used as the sole basis for treatment or other patient management decisions. A negative result may occur with  improper specimen collection/handling, submission of specimen other than nasopharyngeal swab, presence of viral mutation(s) within the areas targeted by this assay, and inadequate number of viral copies(<138 copies/mL). A negative result must be combined with clinical observations, patient history, and epidemiological information. The expected result is Negative.  Fact Sheet for Patients:  BloggerCourse.com  Fact Sheet for Healthcare Providers:  SeriousBroker.it  This test is no t yet approved or cleared by the [place] FDA and  has been authorized for detection and/or diagnosis of SARS-CoV-2 by FDA under an Emergency Use Authorization (EUA).  This EUA will remain  in effect (meaning this test can be used) for the duration of the COVID-19 declaration under Section 564(b)(1) of the Act, 21 U.S.C.section 360bbb-3(b)(1), unless the authorization is terminated  or revoked sooner.       Influenza A by PCR NEGATIVE NEGATIVE Final   Influenza B by PCR NEGATIVE NEGATIVE Final    Comment: (NOTE) The Xpert Xpress SARS-CoV-2/FLU/RSV plus assay is intended as an aid in the diagnosis of influenza from Nasopharyngeal swab specimens and should not be used as a sole basis for treatment. Nasal washings and aspirates are unacceptable for Xpert Xpress SARS-CoV-2/FLU/RSV testing.  Fact Sheet for Patients: BloggerCourse.com  Fact Sheet for Healthcare Providers: SeriousBroker.it  This test is not yet approved or cleared by the [place] FDA and has been authorized for detection and/or diagnosis of SARS-CoV-2 by FDA under an Emergency Use Authorization (EUA). This EUA will remain in effect (meaning this test can be used) for the duration of the COVID-19 declaration under Section 564(b)(1) of the Act, 21 U.S.C. section 360bbb-3(b)(1), unless the authorization is terminated or revoked.  Performed at [facility], [address], [place], [place] [address]   Blood Culture (routine x 2)     Status: None (Preliminary result)   Collection Time: 11/28/2020 11:07 AM   Specimen: BLOOD  Result Value Ref Range Status   Specimen Description BLOOD RIGHT ANTECUBITAL  Final   Special Requests   Final    Blood Culture adequate volume BOTTLES DRAWN AEROBIC AND ANAEROBIC   Culture   Final    NO GROWTH 2 DAYS Performed at [facility], [address] [place], [place] [address]    Report Status PENDING  Incomplete  MRSA PCR Screening     Status: Abnormal   Collection Time: 11/27/2020  3:10 PM   Specimen: Nasal Mucosa; Nasopharyngeal  Result Value Ref Range Status   MRSA by PCR POSITIVE (A) NEGATIVE  Final    Comment:        The GeneXpert MRSA Assay (FDA approved for NASAL specimens only), is one component of a comprehensive MRSA colonization surveillance program. It is not intended to diagnose MRSA infection nor to  guide or monitor treatment for MRSA infections. RESULT CALLED TO, READ BACK BY AND VERIFIED WITH: EVANS,H @ 0056 ON 12/01/20 BY JUW Performed at [facility], [address] [place], [place] [address]     Radiology Reports DG Chest 1 View  Result Date: 12/01/2020 CLINICAL DATA:  Shortness of breath.  Syncope. EXAM: CHEST  1 VIEW COMPARISON:  November 30, 2020 FINDINGS: There worsening bilateral diffuse hazy airspace opacities, greatest on the right. The heart size remains unchanged. Aortic calcifications are noted. There is no pneumothorax. There is a probable small right-sided pleural effusion. There is nonspecific gaseous distention of the colon. IMPRESSION: Worsening bilateral hazy airspace opacities. Electronically Signed   By: Katherine Mantle M.D.   On: 12/01/2020 00:58   CT Maxillofacial W Contrast  Result Date: 12/16/2020 CLINICAL DATA:  Right facial swelling EXAM: CT MAXILLOFACIAL WITH CONTRAST TECHNIQUE: Multidetector CT imaging of the maxillofacial structures was performed with intravenous contrast. Multiplanar CT image reconstructions were also generated. CONTRAST:  32mL OMNIPAQUE IOHEXOL 300 MG/ML  SOLN COMPARISON:  None. FINDINGS: Osseous: Temporomandibular joints are unremarkable. Included upper cervical spine is unremarkable. There is no acute osseous abnormality. Orbits: Unremarkable. Sinuses: Mild lobular left maxillary sinus mucosal thickening. Patchy right mastoid opacification. Partial right middle ear opacification. Soft tissues: Right periparotid tissue swelling extending to the level of the submandibular gland. The right parotid is relatively enlarged and heterogeneous. Right submandibular gland is also relatively enlarged and heterogeneous. There are  no enlarged lymph nodes. Mild calcified plaque at the common carotid bifurcations. Limited intracranial: No abnormal enhancement. IMPRESSION: Enlargement and heterogeneity of the right parotid and submandibular glands suggesting sialoadenitis. Nonspecific patchy right mastoid and partial right middle ear opacification. Electronically Signed   By: Guadlupe Spanish M.D.   On: 12/10/2020 15:31   DG Chest Portable 1 View  Result Date: 12/01/2020 CLINICAL DATA:  PICC line placement. EXAM: PORTABLE CHEST 1 VIEW COMPARISON:  Same day. FINDINGS: The heart size and mediastinal contours are within normal limits. No pneumothorax or pleural effusion is noted. Interval placement of right-sided PICC line with distal tip in expected position of right atrium. Stable bilateral lung opacities are noted concerning for multifocal pneumonia. The visualized skeletal structures are unremarkable. IMPRESSION: Interval placement of right-sided PICC line with distal tip in expected position of right atrium; withdrawal by 3-4 cm is recommended. Stable bilateral lung opacities concerning for multifocal pneumonia. Electronically Signed   By: Lupita Raider M.D.   On: 12/01/2020 11:04   DG CHEST PORT 1 VIEW  Result Date: 11/30/2020 CLINICAL DATA:  Re-evaluation of chest. Patient dx'd with pneumonia. EXAM: PORTABLE CHEST 1 VIEW COMPARISON:  Chest radiograph 11/26/2020 FINDINGS: Stable cardiomediastinal contours. Low lung volumes. Interval slight increase in diffuse bilateral fine interstitial and scattered hazy opacities, some of which may be due to lower lung volumes. No pneumothorax or large pleural effusion. There are multiple air-filled distended loops of bowel in the abdomen. No acute finding in the visualized skeleton. IMPRESSION: 1. Slight interval increase in diffuse bilateral fine interstitial and scattered hazy opacities, some of which may be due to lower lung volumes. 2. Multiple air-filled distended loops of bowel in the  abdomen, consider dedicated abdominal radiographs. Electronically Signed   By: Emmaline Kluver M.D.   On: 11/30/2020 17:17   DG Chest Port 1 View  Result Date: 12/13/2020 CLINICAL DATA:  Tachycardia.  Possible sepsis. EXAM: PORTABLE CHEST 1 VIEW COMPARISON:  10/11/2020 FINDINGS: Heart size is normal. Chronic aortic atherosclerotic calcification. Slight worsening of  patchy pulmonary densities on both sides suggesting mild patchy pneumonia. No dense consolidation or lobar collapse. No effusion. IMPRESSION: Slight worsening of patchy pulmonary densities on both sides suggesting mild patchy pneumonia. No dense consolidation or lobar collapse. Electronically Signed   By: Paulina Fusi M.D.   On: 12/04/2020 11:11   ECHOCARDIOGRAM COMPLETE  Result Date: 11/30/2020    ECHOCARDIOGRAM REPORT   Patient Name:   Erin Donaldson Date of Exam: 11/30/2020 Medical Rec #:  [id number]       Height:       64.0 in Accession #:    [id number]      Weight:       131.6 lb Date of Birth:  Mar 29, 1951       BSA:          1.638 m Patient Age:    70 years        BP:           117/66 mmHg Patient Gender: F               HR:           123 bpm. [facility] Procedure: 2D Echo, Color Doppler and Cardiac Doppler Indications:    R06.02 SOB  History:        Patient has no prior history of Echocardiogram examinations.  Sonographer:    Roosvelt Maser Referring Phys: [id number] Ezme Duch IMPRESSIONS  1. Left ventricular ejection fraction, by estimation, is 60 to 65%. The left ventricle has normal function. The left ventricle has no regional wall motion abnormalities. Left ventricular diastolic parameters are indeterminate.  2. Right ventricular systolic function is normal. The right ventricular size is normal.  3. The mitral valve is normal in structure. No evidence of mitral valve regurgitation. No evidence of mitral stenosis.  4. The aortic valve is tricuspid. Aortic valve regurgitation is not visualized. Mild aortic valve sclerosis  is present, with no evidence of aortic valve stenosis.  5. The inferior vena cava is normal in size with greater than 50% respiratory variability, suggesting right atrial pressure of 3 mmHg. FINDINGS  Left Ventricle: Left ventricular ejection fraction, by estimation, is 60 to 65%. The left ventricle has normal function. The left ventricle has no regional wall motion abnormalities. The left ventricular internal cavity size was normal in size. There is  no left ventricular hypertrophy. Left ventricular diastolic parameters are indeterminate. Right Ventricle: The right ventricular size is normal. Right ventricular systolic function is normal. Left Atrium: Left atrial size was normal in size. Right Atrium: Right atrial size was normal in size. Pericardium: Trivial pericardial effusion is present. Mitral Valve: The mitral valve is normal in structure. No evidence of mitral valve regurgitation. No evidence of mitral valve stenosis. Tricuspid Valve: The tricuspid valve is normal in structure. Tricuspid valve regurgitation is trivial. No evidence of tricuspid stenosis. Aortic Valve: The aortic valve is tricuspid. Aortic valve regurgitation is not visualized. Mild aortic valve sclerosis is present, with no evidence of aortic valve stenosis. Pulmonic Valve: The pulmonic valve was normal in structure. Pulmonic valve regurgitation is not visualized. No evidence of pulmonic stenosis. Aorta: The aortic root is normal in size and structure. Venous: The inferior vena cava is normal in size with greater than 50% respiratory variability, suggesting right atrial pressure of 3 mmHg.   LEFT VENTRICLE PLAX 2D LVIDd:         2.75 cm LVIDs:         1.95 cm  LV PW:         0.86 cm LV IVS:        0.92 cm LVOT diam:     1.80 cm LVOT Area:     2.54 cm  LV Volumes (MOD) LV vol d, MOD A4C: 49.3 ml LV vol s, MOD A4C: 14.0 ml LV SV MOD A4C:     49.3 ml LEFT ATRIUM             Index LA diam:        2.90 cm 1.77 cm/m LA Vol (A2C):   30.6 ml 18.69  ml/m LA Vol (A4C):   43.4 ml 26.50 ml/m LA Biplane Vol: 38.7 ml 23.63 ml/m   AORTA Ao Root diam: 2.80 cm  SHUNTS Systemic Diam: 1.80 cm Olga Millers MD Electronically signed by Olga Millers MD Signature Date/Time: 11/30/2020/2:55:22 PM    Final    LONG TERM MONITOR (3-14 DAYS)  Result Date: 11/17/2020 Patch Wear Time:  13 days and 6 hours (2021-12-23T12:24:49-0500 to 2022-01-05T18:31:19-0500) Predominant rhythm was NSR with min HR of 86 bpm, max HR of 167 bpm, and avg HR of 103 bpm. There were 7 episodes of SVT, the run with the fastest interval lasting 6 beats with a max rate of 167 bpm, the longest lasting 13 beats with an avg rate of 140 bpm. Isolated SVEs were rare (<1.0%), SVE Couplets were rare (<1.0%), and SVE Triplets were rare (<1.0%). Isolated VEs were rare (<1.0%), and no VE Couplets or VE Triplets were present. Laurance Flatten, MD   [place] EKG SITE RITE  Result Date: 12/01/2020 If Site Rite image not attached, placement could not be confirmed due to current cardiac rhythm.    CBC Recent Labs  Lab 11/26/2020 1041 12/01/20 0500  WBC 23.4* 11.0*  HGB 11.9* 11.3*  HCT 36.6 35.5*  PLT 349 298  MCV 94.1 96.5  MCH 30.6 30.7  MCHC 32.5 31.8  RDW 16.9* 17.3*  LYMPHSABS 1.2  --   MONOABS 0.9  --   EOSABS 0.0  --   BASOSABS 0.0  --     Chemistries  Recent Labs  Lab 12/09/2020 1041 12/01/20 0021 12/01/20 0500 12/01/20 0557  NA 135 131* 132*  --   K 3.7 3.4* 3.2*  --   CL 104 110 109  --   CO2 19* 15* 15*  --   GLUCOSE 102* 164* 155*  --   BUN 28* 22 22  --   CREATININE 1.23* 1.03* 0.95  --   CALCIUM 8.2* 6.9* 7.1*  --   MG  --   --   --  1.3*  AST 24 37  --   --   ALT 29 26  --   --   ALKPHOS 170* 215*  --   --   BILITOT 1.1 1.0  --   --    ------------------------------------------------------------------------------------------------------------------ No results for input(s): CHOL, HDL, LDLCALC, TRIG, CHOLHDL, LDLDIRECT in the last 72 hours.  Lab Results   Component Value Date   HGBA1C 6.5 (H) 07/24/2020   ------------------------------------------------------------------------------------------------------------------ Recent Labs    11/30/20 0104  TSH 1.778   ------------------------------------------------------------------------------------------------------------------ No results for input(s): VITAMINB12, FOLATE, FERRITIN, TIBC, IRON, RETICCTPCT in the last 72 hours.  Coagulation profile Recent Labs  Lab 12/05/2020 1041  INR 1.0    No results for input(s): DDIMER in the last 72 hours.  Cardiac Enzymes No results for input(s): CKMB, TROPONINI, MYOGLOBIN in the last 168 hours.  Invalid input(s): CK ------------------------------------------------------------------------------------------------------------------  Component Value Date/Time   BNP 149.0 (H) Jun 16, 2021 1040     Shon Haleourage Onita Pfluger M.D on 12/01/2020 at 6:16 PM  Go to [email or web address] - for contact info  [facility] - Office  [phone]

## 2020-12-01 NOTE — Progress Notes (Signed)
  Discussed worsening overall status with Daughter Chasity -she request we continue Full code status and full scope of treatment -RT and RN at bedside -Oxygen sats improved with Bipap adjustment ABG reflects Metabolic acidosis-----Bicarb ordered--  Shon Hale, MD

## 2020-12-01 NOTE — Progress Notes (Signed)
At 0000 noted increased weeping/edema from bilateral arms. Upon review of chart noted greater than 10 L received. Only 250 output recorded. Patient requiring more oxygen than since beginning of shift. Notified Hospitalist orders for lab and CXR. Awaiting results at this time. Will continue to monitor for changes in condition.

## 2020-12-01 NOTE — Progress Notes (Signed)
RT and Physician in room at shift change due to change in status of patient. Physician spoke with family in reference to code status RT made adjustment to O2 device with improvement noted ABG and orders completed

## 2020-12-01 NOTE — TOC Initial Note (Signed)
Transition of Care [facility]) - Initial/Assessment Note    Patient Details  Name: Erin Donaldson MRN: [id number] Date of Birth: 09-16-1951  Transition of [facility]) CM/SW Contact:    Barry Brunner, LCSW Phone Number: 12/01/2020, 10:53 AM  Clinical Narrative:                 Patient is a 70 year old female admitted for Severe sepsis with septic shock. CSW observed patient's high readmission risk score. CSW conducted risk assessment. PCP to be scheduled closer to d/c. CSW conducted intial assessment. Patient's sister in law reported that patient would be agreeable to SNF if recommended and is currently active with [facility] for PT and OT. Patient's daughter reported that patient was previously denied for SNF. Patient communicated to her sister in law that she is agreeable to a referral with [facility] SNF due to her daughter being employed there. Patient's sister in law reported that patient's daughter would have to discuss and agree to any other SNF referrals. Patient is vaccinated for Covid 19. TOC to follow.   Expected Discharge Plan: Skilled Nursing Facility Barriers to Discharge: Continued Medical Work up   Patient Goals and CMS Choice Patient states their goals for this hospitalization and ongoing recovery are:: Rehab with SNF CMS Medicare.gov Compare Post Acute Care list provided to:: Patient Choice offered to / list presented to : Patient  Expected Discharge Plan and Services Expected Discharge Plan: Skilled Nursing Facility       Living arrangements for the past 2 months: Single Family Home                 DME Arranged: [name] DME Agency: NA       HH Arranged: NA HH Agency: NA        Prior Living Arrangements/Services Living arrangements for the past 2 months: Single Family Home Lives with:: Self,Adult Children,Minor Children Patient language and need for interpreter reviewed:: Yes Do you feel safe going back to the place where you live?: Yes      Need for Family  Participation in Patient Care: Yes (Comment) Care giver support system in place?: Yes (comment) Current home services: Home OT,Home PT (Active with [facility]) Criminal Activity/Legal Involvement Pertinent to Current Situation/Hospitalization: No - Comment as needed  Activities of Daily Living      Permission Sought/Granted   Permission granted to share information with : Yes, Verbal Permission Granted  Share Information with NAME: Missy Angelee, Bahr  Permission granted to share info w AGENCY: SNF agencies  Permission granted to share info w Relationship: Sister in Social worker, Daughter  Permission granted to share info w Contact Information: [phone], [phone]  Emotional Assessment       Orientation: : Oriented to Self Alcohol / Substance Use: Not Applicable Psych Involvement: No (comment)  Admission diagnosis:  Sepsis (HCC) [A41.9] Acute sepsis [facility]) [A41.9] Patient Active Problem List   Diagnosis Date Noted  . Sepsis (HCC) 12/06/2020  . Pneumonia, community acquired 11/28/2020  . Acute Rt Paratoid sialoadenitis 12/09/2020  . Severe sepsis with septic shock (HCC) 12/11/2020  . Mood disorder/Anxiety 12/12/2020  . H/O traumatic subdural hematoma---07/07/2020 12/01/2020  . Scalp wound 08/09/2020  . Subdural hematoma, chronic (HCC) 07/24/2020  . Subdural hematoma (HCC) 07/23/2020  . Generalized edema 07/16/2020  . Hospital discharge follow-up 07/16/2020  . Acute subdural hematoma (HCC) 07/07/2020  . Acute pain of right shoulder 07/02/2020  . Rash due to allergy 07/02/2020  . AKI (acute kidney injury) (HCC) 06/20/2020  .  Hypokalemia 06/20/2020  . Acute lower UTI 06/20/2020  . Vitamin D deficiency 10/03/2018  . Overweight (BMI 25.0-29.9) 04/26/2018  . Senile purpura (HCC) 04/04/2018  . Abnormal EKG 12/29/2017  . Iron deficiency anemia 01/24/2016  . Swelling of right lower extremity 07/15/2015  . Right hip pain 07/15/2015  . Microcytic anemia 07/15/2015  .  Elevated LFTs   . History of gastric bypass 01/15/2014  . Amnestic disorder due to medical condition (HCC) 08/23/2013  . Diarrhea 07/19/2013  . Bipolar affective disorder in remission (HCC) 08/26/2009  . ANXIETY 08/26/2009  . Migraine headache 08/26/2009  . Hyperlipidemia associated with type 2 diabetes mellitus (HCC) 08/15/2009  . Hyperlipidemia 08/15/2009  . Essential hypertension 08/15/2009  . GERD 08/15/2009  . CONSTIPATION 08/15/2009  . IRRITABLE BOWEL SYNDROME 08/15/2009   PCP:  Raliegh Ip, DO Pharmacy:   [id number] - [place], [place] - [address] [place] [address] Phone: [phone] Fax: [phone]     Social Determinants of Health (SDOH) Interventions    Readmission Risk Interventions Readmission Risk Prevention Plan 12/01/2020 08/02/2020 07/09/2020  Post Dischage Appt - - Complete  Medication Screening - - Complete  Transportation Screening Complete Complete Complete  PCP or Specialist Appt within 3-5 Days - Complete -  HRI or Home Care Consult - Complete -  Social Work Consult for Recovery Care Planning/Counseling - Complete -  Palliative Care Screening - Not Applicable -  Medication Review [employer]) Complete Complete -  HRI or Home Care Consult Complete - -  SW Recovery Care/Counseling Consult Complete - -  Palliative Care Screening Not Applicable - -  Skilled Nursing Facility Complete - -  Some recent data might be hidden

## 2020-12-01 NOTE — Progress Notes (Signed)
Peripherally Inserted Central Catheter Placement  The IV Nurse has discussed with the patient and/or persons authorized to consent for the patient, the purpose of this procedure and the potential benefits and risks involved with this procedure.  The benefits include less needle sticks, lab draws from the catheter, and the patient may be discharged home with the catheter. Risks include, but not limited to, infection, bleeding, blood clot (thrombus formation), and puncture of an artery; nerve damage and irregular heartbeat and possibility to perform a PICC exchange if needed/ordered by physician.  Alternatives to this procedure were also discussed.  Bard Power PICC patient education guide, fact sheet on infection prevention and patient information card has been provided to patient /or left at bedside.    PICC Placement Documentation  PICC Double Lumen 12/01/20 PICC Right Basilic 40 cm 0 cm (Active)  Indication for Insertion or Continuance of Line Vasoactive infusions 12/01/20 1017  Exposed Catheter (cm) 0 cm 12/01/20 1017  Site Assessment Clean;Dry;Intact 12/01/20 1017  Lumen #1 Status Blood return noted;Flushed;Saline locked 12/01/20 1017  Lumen #2 Status Blood return noted;Flushed;Saline locked 12/01/20 1017  Dressing Type Transparent;Securing device 12/01/20 1017  Dressing Status Clean;Dry;Intact 12/01/20 1017  Antimicrobial disc in place? Yes 12/01/20 1017  Safety Lock Not Applicable 12/01/20 1017  Line Care Connections checked and tightened 12/01/20 1017  Dressing Change Due 12/08/20 12/01/20 1017       [place]ells M 12/01/2020, 10:20 AM

## 2020-12-02 ENCOUNTER — Inpatient Hospital Stay (HOSPITAL_COMMUNITY): Payer: Medicare Other

## 2020-12-02 DIAGNOSIS — Z515 Encounter for palliative care: Secondary | ICD-10-CM

## 2020-12-02 DIAGNOSIS — A419 Sepsis, unspecified organism: Secondary | ICD-10-CM | POA: Diagnosis not present

## 2020-12-02 DIAGNOSIS — R6521 Severe sepsis with septic shock: Secondary | ICD-10-CM | POA: Diagnosis not present

## 2020-12-02 DIAGNOSIS — J189 Pneumonia, unspecified organism: Secondary | ICD-10-CM | POA: Diagnosis not present

## 2020-12-02 LAB — BLOOD GAS, ARTERIAL
Acid-base deficit: 12.1 mmol/L — ABNORMAL HIGH (ref 0.0–2.0)
Bicarbonate: 15.2 mmol/L — ABNORMAL LOW (ref 20.0–28.0)
FIO2: 60
O2 Saturation: 89.1 %
Patient temperature: 37.5
pCO2 arterial: 27.1 mmHg — ABNORMAL LOW (ref 32.0–48.0)
pH, Arterial: 7.303 — ABNORMAL LOW (ref 7.350–7.450)
pO2, Arterial: 63.2 mmHg — ABNORMAL LOW (ref 83.0–108.0)

## 2020-12-02 LAB — RENAL FUNCTION PANEL
Albumin: 1.5 g/dL — ABNORMAL LOW (ref 3.5–5.0)
Anion gap: 8 (ref 5–15)
BUN: 24 mg/dL — ABNORMAL HIGH (ref 8–23)
CO2: 14 mmol/L — ABNORMAL LOW (ref 22–32)
Calcium: 7.4 mg/dL — ABNORMAL LOW (ref 8.9–10.3)
Chloride: 110 mmol/L (ref 98–111)
Creatinine, Ser: 1.27 mg/dL — ABNORMAL HIGH (ref 0.44–1.00)
GFR, Estimated: 46 mL/min — ABNORMAL LOW (ref >60)
Glucose, Bld: 209 mg/dL — ABNORMAL HIGH (ref 70–99)
Phosphorus: 2.8 mg/dL (ref 2.5–4.6)
Potassium: 4.9 mmol/L (ref 3.5–5.1)
Sodium: 132 mmol/L — ABNORMAL LOW (ref 135–145)

## 2020-12-02 LAB — URINE CULTURE: Culture: 30000 — AB

## 2020-12-02 LAB — COMPREHENSIVE METABOLIC PANEL
ALT: 29 U/L (ref 0–44)
AST: 51 U/L — ABNORMAL HIGH (ref 15–41)
Albumin: 1.5 g/dL — ABNORMAL LOW (ref 3.5–5.0)
Alkaline Phosphatase: 282 U/L — ABNORMAL HIGH (ref 38–126)
Anion gap: 10 (ref 5–15)
BUN: 23 mg/dL (ref 8–23)
CO2: 13 mmol/L — ABNORMAL LOW (ref 22–32)
Calcium: 7.4 mg/dL — ABNORMAL LOW (ref 8.9–10.3)
Chloride: 109 mmol/L (ref 98–111)
Creatinine, Ser: 1.24 mg/dL — ABNORMAL HIGH (ref 0.44–1.00)
GFR, Estimated: 48 mL/min — ABNORMAL LOW (ref >60)
Glucose, Bld: 210 mg/dL — ABNORMAL HIGH (ref 70–99)
Potassium: 4.9 mmol/L (ref 3.5–5.1)
Sodium: 132 mmol/L — ABNORMAL LOW (ref 135–145)
Total Bilirubin: 1 mg/dL (ref 0.3–1.2)
Total Protein: 4.2 g/dL — ABNORMAL LOW (ref 6.5–8.1)

## 2020-12-02 LAB — IRON AND TIBC: Iron: 47 ug/dL (ref 28–170)

## 2020-12-02 LAB — CBC
HCT: 35.9 % — ABNORMAL LOW (ref 36.0–46.0)
Hemoglobin: 11.5 g/dL — ABNORMAL LOW (ref 12.0–15.0)
MCH: 31.1 pg (ref 26.0–34.0)
MCHC: 32 g/dL (ref 30.0–36.0)
MCV: 97 fL (ref 80.0–100.0)
Platelets: 352 10*3/uL (ref 150–400)
RBC: 3.7 MIL/uL — ABNORMAL LOW (ref 3.87–5.11)
RDW: 17.1 % — ABNORMAL HIGH (ref 11.5–15.5)
WBC: 17.4 10*3/uL — ABNORMAL HIGH (ref 4.0–10.5)
nRBC: 0.3 % — ABNORMAL HIGH (ref 0.0–0.2)

## 2020-12-02 LAB — FERRITIN: Ferritin: 702 ng/mL — ABNORMAL HIGH (ref 11–307)

## 2020-12-02 LAB — FOLATE: Folate: 3 ng/mL — ABNORMAL LOW (ref >5.9)

## 2020-12-02 LAB — GLUCOSE, CAPILLARY: Glucose-Capillary: 220 mg/dL — ABNORMAL HIGH (ref 70–99)

## 2020-12-02 LAB — VITAMIN B12: Vitamin B-12: 1301 pg/mL — ABNORMAL HIGH (ref 180–914)

## 2020-12-02 MED ORDER — SODIUM BICARBONATE 8.4 % IV SOLN
100.0000 meq | Freq: Once | INTRAVENOUS | Status: AC
  Administered 2020-12-02: 100 meq via INTRAVENOUS

## 2020-12-02 MED ORDER — MORPHINE SULFATE (PF) 2 MG/ML IV SOLN
2.0000 mg | INTRAVENOUS | Status: DC | PRN
  Administered 2020-12-02: 2 mg via INTRAVENOUS
  Filled 2020-12-02: qty 1

## 2020-12-02 MED ORDER — SODIUM BICARBONATE 8.4 % IV SOLN
INTRAVENOUS | Status: DC
  Filled 2020-12-02 (×3): qty 850

## 2020-12-02 MED ORDER — LORAZEPAM 2 MG/ML IJ SOLN
1.0000 mg | INTRAMUSCULAR | Status: DC | PRN
  Administered 2020-12-02: 1 mg via INTRAVENOUS
  Filled 2020-12-02: qty 1

## 2020-12-03 LAB — LEGIONELLA PNEUMOPHILA SEROGP 1 UR AG: L. pneumophila Serogp 1 Ur Ag: NEGATIVE

## 2020-12-04 ENCOUNTER — Ambulatory Visit: Payer: Medicare Other | Admitting: Family Medicine

## 2020-12-04 LAB — CULTURE, BLOOD (ROUTINE X 2)
Culture: NO GROWTH
Culture: NO GROWTH
Special Requests: ADEQUATE
Special Requests: ADEQUATE

## 2020-12-04 MED FILL — Medication: Qty: 1 | Status: AC

## 2020-12-05 ENCOUNTER — Other Ambulatory Visit (HOSPITAL_COMMUNITY): Payer: Medicare Other

## 2020-12-17 ENCOUNTER — Ambulatory Visit: Payer: Medicare Other | Admitting: Cardiology

## 2020-12-17 NOTE — Progress Notes (Signed)
eLink Physician-Brief Progress Note Patient Name: Erin Donaldson DOB: 1951/02/21 MRN: [id number]   Date of Service  12/08/2020  HPI/Events of Note  this pt is currently on bipap, had ABG's around 7pm yesterday which they pushed amps of Bicarb,  they are sending off an ABG now, she looks concerning,  Levo a t 28 and Vaso .03    123/23  HR 139  Discussed with James/RN.  1. Septic shock from CAP/Parotitis. On BiPAP, Ve at 22 lit  Encephalopathic/AKI. Electrolytes replaced and on 2 pressors and bicarb for metabolic acidosis.   eICU Interventions  - get ABG and call back. Continue current care.  - asp precautions. Intubate if not getting better.  Consider art line, if BP fluctuating or not getting consistent readings. Levo weaning down from 40 to 28 as per RN. UOP 400 since beginning of shift.      Intervention Category Intermediate Interventions: Respiratory distress - evaluation and management;Hypotension - evaluation and management  Ranee Gosselin 12/01/2020, 2:51 AM

## 2020-12-17 NOTE — Death Summary Note (Signed)
DEATH SUMMARY   Patient Details  Name: Erin Donaldson MRN: [id number] DOB: 1951-03-07  Admission/Discharge Information   Admit Date:  12/24/2020  Date of Death: Date of Death: 12/27/2020  Time of Death: Time of Death: 1743  Length of Stay: 3  Referring Physician: Raliegh Ip, DO   Reason(s) for Hospitalization  Severe Sepsis with Septic Shock--- POA--due to CAP Pneumonia with superimposed Rt Paratoid Sialoadenitis  Diagnoses  Preliminary cause of death:  Secondary Diagnoses (including complications and co-morbidities):  Principal Problem:   Severe sepsis with septic shock (HCC) Active Problems:   Sepsis (HCC)   Pneumonia, community acquired   Acute Rt Paratoid sialoadenitis   End of life care   Palliative care encounter   H/O traumatic subdural hematoma---07/07/2020   Mood disorder/Anxiety   Pressure injury of skin   Brief Hospital Course (including significant findings, care, treatment, and services provided and events leading to death)  Erin Donaldson is a 70 y.o. year old female who was admitted with- Severe Sepsis with Septic Shock--- POA--due to CAP Pneumonia with superimposed Rt Paratoid Sialoadenitis  --Please see full progress note dated 12/01/2020 for details of patient's hospital stay  Pertinent Labs and Studies  Significant Diagnostic Studies DG Chest 1 View  Result Date: 12/01/2020 CLINICAL DATA:  Shortness of breath.  Syncope. EXAM: CHEST  1 VIEW COMPARISON:  November 30, 2020 FINDINGS: There worsening bilateral diffuse hazy airspace opacities, greatest on the right. The heart size remains unchanged. Aortic calcifications are noted. There is no pneumothorax. There is a probable small right-sided pleural effusion. There is nonspecific gaseous distention of the colon. IMPRESSION: Worsening bilateral hazy airspace opacities. Electronically Signed   By: Katherine Mantle M.D.   On: 12/01/2020 00:58   CT Maxillofacial W Contrast  Result Date:  Dec 24, 2020 CLINICAL DATA:  Right facial swelling EXAM: CT MAXILLOFACIAL WITH CONTRAST TECHNIQUE: Multidetector CT imaging of the maxillofacial structures was performed with intravenous contrast. Multiplanar CT image reconstructions were also generated. CONTRAST:  66mL OMNIPAQUE IOHEXOL 300 MG/ML  SOLN COMPARISON:  None. FINDINGS: Osseous: Temporomandibular joints are unremarkable. Included upper cervical spine is unremarkable. There is no acute osseous abnormality. Orbits: Unremarkable. Sinuses: Mild lobular left maxillary sinus mucosal thickening. Patchy right mastoid opacification. Partial right middle ear opacification. Soft tissues: Right periparotid tissue swelling extending to the level of the submandibular gland. The right parotid is relatively enlarged and heterogeneous. Right submandibular gland is also relatively enlarged and heterogeneous. There are no enlarged lymph nodes. Mild calcified plaque at the common carotid bifurcations. Limited intracranial: No abnormal enhancement. IMPRESSION: Enlargement and heterogeneity of the right parotid and submandibular glands suggesting sialoadenitis. Nonspecific patchy right mastoid and partial right middle ear opacification. Electronically Signed   By: Guadlupe Spanish M.D.   On: December 24, 2020 15:31   DG Chest Portable 1 View  Result Date: 12/01/2020 CLINICAL DATA:  PICC line placement. EXAM: PORTABLE CHEST 1 VIEW COMPARISON:  Same day. FINDINGS: The heart size and mediastinal contours are within normal limits. No pneumothorax or pleural effusion is noted. Interval placement of right-sided PICC line with distal tip in expected position of right atrium. Stable bilateral lung opacities are noted concerning for multifocal pneumonia. The visualized skeletal structures are unremarkable. IMPRESSION: Interval placement of right-sided PICC line with distal tip in expected position of right atrium; withdrawal by 3-4 cm is recommended. Stable bilateral lung opacities  concerning for multifocal pneumonia. Electronically Signed   By: Lupita Raider M.D.   On: 12/01/2020 11:04   DG  CHEST PORT 1 VIEW  Result Date: 11/30/2020 CLINICAL DATA:  Re-evaluation of chest. Patient dx'd with pneumonia. EXAM: PORTABLE CHEST 1 VIEW COMPARISON:  Chest radiograph 27-Dec-2020 FINDINGS: Stable cardiomediastinal contours. Low lung volumes. Interval slight increase in diffuse bilateral fine interstitial and scattered hazy opacities, some of which may be due to lower lung volumes. No pneumothorax or large pleural effusion. There are multiple air-filled distended loops of bowel in the abdomen. No acute finding in the visualized skeleton. IMPRESSION: 1. Slight interval increase in diffuse bilateral fine interstitial and scattered hazy opacities, some of which may be due to lower lung volumes. 2. Multiple air-filled distended loops of bowel in the abdomen, consider dedicated abdominal radiographs. Electronically Signed   By: Emmaline Kluver M.D.   On: 11/30/2020 17:17   DG Chest Port 1 View  Result Date: 12/27/2020 CLINICAL DATA:  Tachycardia.  Possible sepsis. EXAM: PORTABLE CHEST 1 VIEW COMPARISON:  10/11/2020 FINDINGS: Heart size is normal. Chronic aortic atherosclerotic calcification. Slight worsening of patchy pulmonary densities on both sides suggesting mild patchy pneumonia. No dense consolidation or lobar collapse. No effusion. IMPRESSION: Slight worsening of patchy pulmonary densities on both sides suggesting mild patchy pneumonia. No dense consolidation or lobar collapse. Electronically Signed   By: Paulina Fusi M.D.   On: 12-27-2020 11:11   DG ABD ACUTE 2+V W 1V CHEST  Result Date: 12/06/2020 CLINICAL DATA:  70 year old female with shortness of breath and altered mental status. Negative for COVID-19. This admission. EXAM: DG ABDOMEN ACUTE WITH 1 VIEW CHEST COMPARISON:  Portable chest radiographs 12/01/2020 and earlier. No prior abdominal radiographs. FINDINGS: Portable AP upright  view of the chest at 0536 hours. Stable right PICC line. Mediastinal contours remain normal. Visualized tracheal air column is within normal limits. There is gas in the thoracic esophagus, caliber remains normal. Coarse interstitial bilateral pulmonary opacity is greater in the right lung and stable since 11/30/2020. Portable AP upright and supine views of the abdomen and pelvis at 0535 hours. Multiple bilateral abdominal surgical clips, greater on the left. Gas throughout nondilated small bowel loops, and mildly to moderately distending the transverse colon and especially the splenic flexure. Similar gas-filled splenic flexure in 2021 chest radiographs. Staple line in the left upper quadrant suggesting prior gastric bypass. The stomach does not seem to be distended. No free air. There is a paucity of distal large bowel gas although some gas in the rectum. Midline pelvic catheter in place, probably Foley. Vascular calcifications. No acute osseous abnormality identified. IMPRESSION: 1. Stable chest. Coarse interstitial opacity greater in the right lung. 2. Postoperative changes in the abdomen suggestive of previous gastric bypass. Bowel-gas pattern more suggestive of ileus than a distal large bowel obstruction, with gaseous distension of the splenic flexure similar to although progressed from radiographs last year. 3. Gas-filled thoracic esophagus felt secondary to #2. 4. No free air. Electronically Signed   By: Odessa Fleming M.D.   On: 11/18/2020 05:51   ECHOCARDIOGRAM COMPLETE  Result Date: 11/30/2020    ECHOCARDIOGRAM REPORT   Patient Name:   JERALYN NOLDEN Date of Exam: 11/30/2020 Medical Rec #:  [id number]       Height:       64.0 in Accession #:    [id number]      Weight:       131.6 lb Date of Birth:  [birth year]       BSA:          1.638 m Patient Age:    70 years  BP:           117/66 mmHg Patient Gender: F               HR:           123 bpm. [facility] Procedure: 2D Echo, Color Doppler  and Cardiac Doppler Indications:    R06.02 SOB  History:        Patient has no prior history of Echocardiogram examinations.  Sonographer:    Roosvelt Maserachel Lane Referring Phys: [id number] Dhanush Jokerst IMPRESSIONS  1. Left ventricular ejection fraction, by estimation, is 60 to 65%. The left ventricle has normal function. The left ventricle has no regional wall motion abnormalities. Left ventricular diastolic parameters are indeterminate.  2. Right ventricular systolic function is normal. The right ventricular size is normal.  3. The mitral valve is normal in structure. No evidence of mitral valve regurgitation. No evidence of mitral stenosis.  4. The aortic valve is tricuspid. Aortic valve regurgitation is not visualized. Mild aortic valve sclerosis is present, with no evidence of aortic valve stenosis.  5. The inferior vena cava is normal in size with greater than 50% respiratory variability, suggesting right atrial pressure of 3 mmHg. FINDINGS  Left Ventricle: Left ventricular ejection fraction, by estimation, is 60 to 65%. The left ventricle has normal function. The left ventricle has no regional wall motion abnormalities. The left ventricular internal cavity size was normal in size. There is  no left ventricular hypertrophy. Left ventricular diastolic parameters are indeterminate. Right Ventricle: The right ventricular size is normal. Right ventricular systolic function is normal. Left Atrium: Left atrial size was normal in size. Right Atrium: Right atrial size was normal in size. Pericardium: Trivial pericardial effusion is present. Mitral Valve: The mitral valve is normal in structure. No evidence of mitral valve regurgitation. No evidence of mitral valve stenosis. Tricuspid Valve: The tricuspid valve is normal in structure. Tricuspid valve regurgitation is trivial. No evidence of tricuspid stenosis. Aortic Valve: The aortic valve is tricuspid. Aortic valve regurgitation is not visualized. Mild aortic valve sclerosis  is present, with no evidence of aortic valve stenosis. Pulmonic Valve: The pulmonic valve was normal in structure. Pulmonic valve regurgitation is not visualized. No evidence of pulmonic stenosis. Aorta: The aortic root is normal in size and structure. Venous: The inferior vena cava is normal in size with greater than 50% respiratory variability, suggesting right atrial pressure of 3 mmHg.   LEFT VENTRICLE PLAX 2D LVIDd:         2.75 cm LVIDs:         1.95 cm LV PW:         0.86 cm LV IVS:        0.92 cm LVOT diam:     1.80 cm LVOT Area:     2.54 cm  LV Volumes (MOD) LV vol d, MOD A4C: 49.3 ml LV vol s, MOD A4C: 14.0 ml LV SV MOD A4C:     49.3 ml LEFT ATRIUM             Index LA diam:        2.90 cm 1.77 cm/m LA Vol (A2C):   30.6 ml 18.69 ml/m LA Vol (A4C):   43.4 ml 26.50 ml/m LA Biplane Vol: 38.7 ml 23.63 ml/m   AORTA Ao Root diam: 2.80 cm  SHUNTS Systemic Diam: 1.80 cm Olga MillersBrian Crenshaw MD Electronically signed by Olga MillersBrian Crenshaw MD Signature Date/Time: 11/30/2020/2:55:22 PM    Final  LONG TERM MONITOR (3-14 DAYS)  Result Date: 2020-12-25 Patch Wear Time:  13 days and 6 hours (2021-12-23T12:24:49-0500 to 2022-01-05T18:31:19-0500) Predominant rhythm was NSR with min HR of 86 bpm, max HR of 167 bpm, and avg HR of 103 bpm. There were 7 episodes of SVT, the run with the fastest interval lasting 6 beats with a max rate of 167 bpm, the longest lasting 13 beats with an avg rate of 140 bpm. Isolated SVEs were rare (<1.0%), SVE Couplets were rare (<1.0%), and SVE Triplets were rare (<1.0%). Isolated VEs were rare (<1.0%), and no VE Couplets or VE Triplets were present. Laurance Flatten, MD   [place] EKG SITE RITE  Result Date: 12/01/2020 If Site Rite image not attached, placement could not be confirmed due to current cardiac rhythm.   Microbiology Recent Results (from the past 240 hour(s))  Blood Culture (routine x 2)     Status: None (Preliminary result)   Collection Time: 12-25-20 10:41 AM   Specimen: BLOOD   Result Value Ref Range Status   Specimen Description BLOOD LEFT ANTECUBITAL  Final   Special Requests   Final    Blood Culture adequate volume BOTTLES DRAWN AEROBIC AND ANAEROBIC   Culture   Final    NO GROWTH 3 DAYS Performed at [facility], [address] [place], [place] [address]    Report Status PENDING  Incomplete  Urine culture     Status: Abnormal   Collection Time: 12/25/20 11:04 AM   Specimen: In/Out Cath Urine  Result Value Ref Range Status   Specimen Description   Final    IN/OUT CATH URINE Performed at [facility], [address], [place], [place] [address]    Special Requests   Final    NONE Performed at [facility], [address], [place], [place] [address]    Culture 30,000 COLONIES/mL ESCHERICHIA COLI (A)  Final   Report Status 12/06/2020 FINAL  Final   Organism ID, Bacteria ESCHERICHIA COLI (A)  Final      Susceptibility   Escherichia coli - MIC*    AMPICILLIN >=32 RESISTANT Resistant     CEFAZOLIN <=4 SENSITIVE Sensitive     CEFEPIME <=0.12 SENSITIVE Sensitive     CEFTRIAXONE <=0.25 SENSITIVE Sensitive     CIPROFLOXACIN >=4 RESISTANT Resistant     GENTAMICIN <=1 SENSITIVE Sensitive     IMIPENEM 1 SENSITIVE Sensitive     NITROFURANTOIN <=16 SENSITIVE Sensitive     TRIMETH/SULFA >=320 RESISTANT Resistant     AMPICILLIN/SULBACTAM 4 SENSITIVE Sensitive     PIP/TAZO <=4 SENSITIVE Sensitive     * 30,000 COLONIES/mL ESCHERICHIA COLI  Resp Panel by RT-PCR (Flu A&B, Covid) Nasopharyngeal Swab     Status: None   Collection Time: 12/25/20 11:05 AM   Specimen: Nasopharyngeal Swab; Nasopharyngeal(NP) swabs in vial transport medium  Result Value Ref Range Status   SARS Coronavirus 2 by RT PCR NEGATIVE NEGATIVE Final    Comment: (NOTE) SARS-CoV-2 target nucleic acids are NOT DETECTED.  The SARS-CoV-2 RNA is generally detectable in upper respiratory specimens during the acute phase of infection. The lowest concentration of SARS-CoV-2 viral copies this assay can  detect is 138 copies/mL. A negative result does not preclude SARS-Cov-2 infection and should not be used as the sole basis for treatment or other patient management decisions. A negative result may occur with  improper specimen collection/handling, submission of specimen other than nasopharyngeal swab, presence of viral mutation(s) within the areas targeted by this assay, and inadequate number of  viral copies(<138 copies/mL). A negative result must be combined with clinical observations, patient history, and epidemiological information. The expected result is Negative.  Fact Sheet for Patients:  BloggerCourse.comhttps://www.fda.gov/media/152166/download  Fact Sheet for Healthcare Providers:  SeriousBroker.ithttps://www.fda.gov/media/152162/download  This test is no t yet approved or cleared by the [place] FDA and  has been authorized for detection and/or diagnosis of SARS-CoV-2 by FDA under an Emergency Use Authorization (EUA). This EUA will remain  in effect (meaning this test can be used) for the duration of the COVID-19 declaration under Section 564(b)(1) of the Act, 21 U.S.C.section 360bbb-3(b)(1), unless the authorization is terminated  or revoked sooner.       Influenza A by PCR NEGATIVE NEGATIVE Final   Influenza B by PCR NEGATIVE NEGATIVE Final    Comment: (NOTE) The Xpert Xpress SARS-CoV-2/FLU/RSV plus assay is intended as an aid in the diagnosis of influenza from Nasopharyngeal swab specimens and should not be used as a sole basis for treatment. Nasal washings and aspirates are unacceptable for Xpert Xpress SARS-CoV-2/FLU/RSV testing.  Fact Sheet for Patients: BloggerCourse.comhttps://www.fda.gov/media/152166/download  Fact Sheet for Healthcare Providers: SeriousBroker.ithttps://www.fda.gov/media/152162/download  This test is not yet approved or cleared by the [place] FDA and has been authorized for detection and/or diagnosis of SARS-CoV-2 by FDA under an Emergency Use Authorization (EUA). This EUA will remain in  effect (meaning this test can be used) for the duration of the COVID-19 declaration under Section 564(b)(1) of the Act, 21 U.S.C. section 360bbb-3(b)(1), unless the authorization is terminated or revoked.  Performed at [facility], [address] [place], [place] [address]   Blood Culture (routine x 2)     Status: None (Preliminary result)   Collection Time: 11/23/2020 11:07 AM   Specimen: BLOOD  Result Value Ref Range Status   Specimen Description BLOOD RIGHT ANTECUBITAL  Final   Special Requests   Final    Blood Culture adequate volume BOTTLES DRAWN AEROBIC AND ANAEROBIC   Culture   Final    NO GROWTH 3 DAYS Performed at [facility], [address] [place], [place] [address]    Report Status PENDING  Incomplete  MRSA PCR Screening     Status: Abnormal   Collection Time: 12/01/2020  3:10 PM   Specimen: Nasal Mucosa; Nasopharyngeal  Result Value Ref Range Status   MRSA by PCR POSITIVE (A) NEGATIVE Final    Comment:        The GeneXpert MRSA Assay (FDA approved for NASAL specimens only), is one component of a comprehensive MRSA colonization surveillance program. It is not intended to diagnose MRSA infection nor to guide or monitor treatment for MRSA infections. RESULT CALLED TO, READ BACK BY AND VERIFIED WITH: EVANS,H @ 0056 ON 12/01/20 BY JUW Performed at [facility], [address] [place], [place] [address]     Lab Basic Metabolic Panel: Recent Labs  Lab 11/17/2020 1041 12/01/20 0021 12/01/20 0500 12/01/20 0557 May 04, 2021 0400  NA 135 131* 132*  --  132*  132*  K 3.7 3.4* 3.2*  --  4.9  4.9  CL 104 110 109  --  109  110  CO2 19* 15* 15*  --  13*  14*  GLUCOSE 102* 164* 155*  --  210*  209*  BUN 28* 22 22  --  23  24*  CREATININE 1.23* 1.03* 0.95  --  1.24*  1.27*  CALCIUM 8.2* 6.9* 7.1*  --  7.4*  7.4*  MG  --   --   --  1.3*  --  PHOS  --   --  2.0*  --  2.8   Liver Function Tests: Recent Labs  Lab 12/05/2020 1041 12/01/20 0021 12/01/20 0500  2020/12/27 0400  AST 24 37  --  51*  ALT 29 26  --  29  ALKPHOS 170* 215*  --  282*  BILITOT 1.1 1.0  --  1.0  PROT 5.2* 4.3*  --  4.2*  ALBUMIN 1.9* 1.5* 1.5* 1.5*  1.5*   No results for input(s): LIPASE, AMYLASE in the last 168 hours. No results for input(s): AMMONIA in the last 168 hours. CBC: Recent Labs  Lab 11/23/2020 1041 12/01/20 0500 12/01/20 2138 2020-12-27 0400  WBC 23.4* 11.0* 18.8* 17.4*  NEUTROABS 21.3*  --   --   --   HGB 11.9* 11.3* 9.6* 11.5*  HCT 36.6 35.5* 30.5* 35.9*  MCV 94.1 96.5 99.0 97.0  PLT 349 298 381 352   Cardiac Enzymes: No results for input(s): CKTOTAL, CKMB, CKMBINDEX, TROPONINI in the last 168 hours. Sepsis Labs: Recent Labs  Lab 11/24/2020 1041 12/09/2020 1252 12/01/20 0500 12/01/20 2138 27-Dec-2020 0400  WBC 23.4*  --  11.0* 18.8* 17.4*  LATICACIDVEN 3.0* 1.9  --   --   --     Procedures/Operations   -Please see full progress note dated 12/01/2020 for details of patient's hospital stay   Liora Myles 12/27/2020, 6:48 PM

## 2020-12-17 NOTE — Progress Notes (Signed)
   Family conference with patient's daughter Erin Dandy, RN [name], this writer (Erin Donaldson ), patient's sister Ms [name] and pt's sister-in-law Ms. [name]    Patient's current medical condition discussed, questions answered -Plan of care, overall poor prognosis also discussed - -- Please see pulmonary critical care specialist progress note from Dr. Vassie Loll dated December 11, 2020 at 3:38 PM -- Despite aggressive treatment protocols patient respiratory status remains tenuous, she has been BiPAP dependent for several days now, unable to take any oral intake  -Any attempt to take patient off BiPAP and transition to heated high flow oxygen is typically limited by significant drop in the oxygen saturation within a very short time  Patient's family including patient's daughter, patient's sister and patient's sister-in-law have decided to transition to Full comfort care -They request that we CODE STATUS to DNR/DNI  --We will respect the family's wishes - Total care time over 45 minutes with more than 50% spent with counseling and care coordination  Shon Hale, MD

## 2020-12-17 NOTE — Progress Notes (Signed)
Patient still breathing about 40 times a minute. She is receiving Bicarb to adjust her PH. Will check Blood gas around 3 am. Fio2 has been decreased on BiPAP to 60.

## 2020-12-17 NOTE — Progress Notes (Signed)
Patient to transition to comfort care once all family members arrive and say their good-byes.

## 2020-12-17 NOTE — Discharge Summary (Addendum)
  Problem  End of Life Care  Palliative Care Encounter   Problem  End of Life Care  Palliative Care Encounter     -Please see full progress note dated 12/01/2020 for details of patient's hospital stay  -Please see full death summary dictated 12-26-20  Shon Hale, MD

## 2020-12-17 NOTE — Progress Notes (Addendum)
NAME:  Erin Donaldson, MRN:  [id number], DOB:  09-Jul-1951, LOS: 3 ADMISSION DATE:  12/03/2020, CONSULTATION DATE:  Dec 29, 2020  REFERRING MD:  EDP, Zammit, CHIEF COMPLAINT:  hypotension   Brief History:  70 year old post gastric bypass, chronic hypotension on midodrine admitted with septic shock due to presumed HAP, treated for left lower lobe CAP 09/2020  History of Present Illness:  She had gastric bypass in 2005.  She has chronic hypertension with a syncopal episode in the past, was evaluated by cardiology 11/01/20 and started on midodrine She went to PCP office 1/14 with right facial swelling and was found to be hypotensive and tachycardic .  ED initial vital signs were temperature of 95, tachycardic to 130, blood pressure 60s, improved to 80s with 2 L IV fluids.  Lactate 3.0 troponin negative, WBC count 23K, fecal occult blood positive, hemoglobin 9.9 Given epinephrine IM x 1 for?  Suspicion of allergic reaction due to parotid swelling  Past Medical History:  Bipolar disorder Diabetes mellitus Status post gastric bypass 2005 Subdural hematoma  Significant Hospital Events:    Consults:    Procedures:    Significant Diagnostic Tests:  CT maxillofacial 1/14 >>  Micro Data:  Blood 1/14 >> ng Urine 1/14 >> 30k e coli  Antimicrobials:  Cefepime 1/14 >> Vancomycin 1/14 >>  Interim History / Subjective:  Events over the weekend reviewed. Progressive shock increase doses of Levophed and vasopressin has not Worsening hypoxia since yesterday on BiPAP. Increasing acidosis now improving with bicarbonate.  Objective   Blood pressure (!) 115/57, pulse (!) 31, temperature 99.3 F (37.4 C), resp. rate (!) 29, height 5\' 4"  (1.626 m), weight 59.7 kg, SpO2 (!) 58 %.    FiO2 (%):  [75 %] 75 %   Intake/Output Summary (Last 24 hours) at 2020/12/29 1532 Last data filed at 29-Dec-2020 12/04/2020 Gross per 24 hour  Intake 1361.92 ml  Output 425 ml  Net 936.92 ml   Filed Weights    12/13/2020 1030 11/24/2020 1714  Weight: 54 kg 59.7 kg    Examination: General: Elderly woman, lying supine, on bipAP full face mask HENT: No pallor, no icterus , right parotid swelling, mild tenderness, dry oral mucosa Lungs: Clear breath sounds bilateral, no rhonchi, no accessory muscle use Cardiovascular: S1-S2 tacky, regular, no murmur Abdomen: Soft, nontender abdomen Extremities: No edema, mild erythema right leg Neuro:sedate, appears comfortable GU: Concentrated urine in Foley bag   Chest x-ray 1/17 independently reviewed shows right more than left bilateral patchy infiltrate. Nondilated small bowel loops splenic flexure of colon elevated, not distended stomach, no.  Resolved Hospital Problem list     Assessment & Plan:  Septic shock presumably due to pneumonia, although her presenting complaint seems to have been due to parotitis.  She also did not have any symptoms referable to pneumonia Lactate cleared initially but progressive shock and acidosis Chronic hypertension -on midodrine, random cortisol high, appropriate After discussion with primary attending today, DNR issued  Recommend -DNR now with plan to progress to comfort care -On BiPAP and transition to nasal cannula   Upper GI bleed -fecal occult blood positive, stable hemoglobin GI evaluation deferred  Best practice (evaluated daily)  Diet: N.p.o. Mobility: Bedrest Disposition: ICU >> comfort care  Goals of Care:   Code Status:DNR  Labs   CBC: Recent Labs  Lab 11/17/2020 1041 12/01/20 0500 12/01/20 2138 Dec 29, 2020 0400  WBC 23.4* 11.0* 18.8* 17.4*  NEUTROABS 21.3*  --   --   --   HGB  11.9* 11.3* 9.6* 11.5*  HCT 36.6 35.5* 30.5* 35.9*  MCV 94.1 96.5 99.0 97.0  PLT 349 298 381 352    Basic Metabolic Panel: Recent Labs  Lab 11/26/2020 1041 12/01/20 0021 12/01/20 0500 12/01/20 0557 12-07-20 0400  NA 135 131* 132*  --  132*  132*  K 3.7 3.4* 3.2*  --  4.9  4.9  CL 104 110 109  --  109  110  CO2  19* 15* 15*  --  13*  14*  GLUCOSE 102* 164* 155*  --  210*  209*  BUN 28* 22 22  --  23  24*  CREATININE 1.23* 1.03* 0.95  --  1.24*  1.27*  CALCIUM 8.2* 6.9* 7.1*  --  7.4*  7.4*  MG  --   --   --  1.3*  --   PHOS  --   --  2.0*  --  2.8   GFR: Estimated Creatinine Clearance: 37.1 mL/min (A) (by C-G formula based on SCr of 1.27 mg/dL (H)). Recent Labs  Lab 12/14/2020 1041 12/04/2020 1252 12/01/20 0500 12/01/20 2138 December 07, 2020 0400  WBC 23.4*  --  11.0* 18.8* 17.4*  LATICACIDVEN 3.0* 1.9  --   --   --     Liver Function Tests: Recent Labs  Lab 12/04/2020 1041 12/01/20 0021 12/01/20 0500 12/07/20 0400  AST 24 37  --  51*  ALT 29 26  --  29  ALKPHOS 170* 215*  --  282*  BILITOT 1.1 1.0  --  1.0  PROT 5.2* 4.3*  --  4.2*  ALBUMIN 1.9* 1.5* 1.5* 1.5*  1.5*   No results for input(s): LIPASE, AMYLASE in the last 168 hours. No results for input(s): AMMONIA in the last 168 hours.  ABG    Component Value Date/Time   PHART 7.303 (L) 2020/12/07 0300   PCO2ART 27.1 (L) 12/07/2020 0300   PO2ART 63.2 (L) 12/07/20 0300   HCO3 15.2 (L) Dec 07, 2020 0300   TCO2 23 07/07/2020 1538   ACIDBASEDEF 12.1 (H) 12/07/2020 0300   O2SAT 89.1 12-07-2020 0300     Coagulation Profile: Recent Labs  Lab 12/04/2020 1041  INR 1.0   PCCM available  Cyril Mourning MD. FCCP. [facility] See Amion for pager  If no response to pager , please call [phone]  After 7:00 pm call Elink  [phone]   07-Dec-2020

## 2020-12-17 NOTE — Progress Notes (Signed)
eLink Physician-Brief Progress Note Patient Name: Erin Donaldson DOB: 03-Mar-1951 MRN: [id number]   Date of Service  12/06/2020  HPI/Events of Note  ABG reviewed. On BiPAP metabolic acidosis improving on bicarb gtt.   eICU Interventions  Continue care.      Intervention Category Intermediate Interventions: Other:;Diagnostic test evaluation  Ranee Gosselin 11/29/2020, 5:08 AM

## 2020-12-17 DEATH — deceased

## 2021-01-04 ENCOUNTER — Other Ambulatory Visit: Payer: Self-pay | Admitting: Psychiatry

## 2022-04-01 IMAGING — DX DG CHEST 2V
2 series · 2 of 2 positions shown · non-contrast
Comparison: 06/20/2020

CLINICAL DATA: Cough.

EXAM:
CHEST - 2 VIEW

[chest lat]
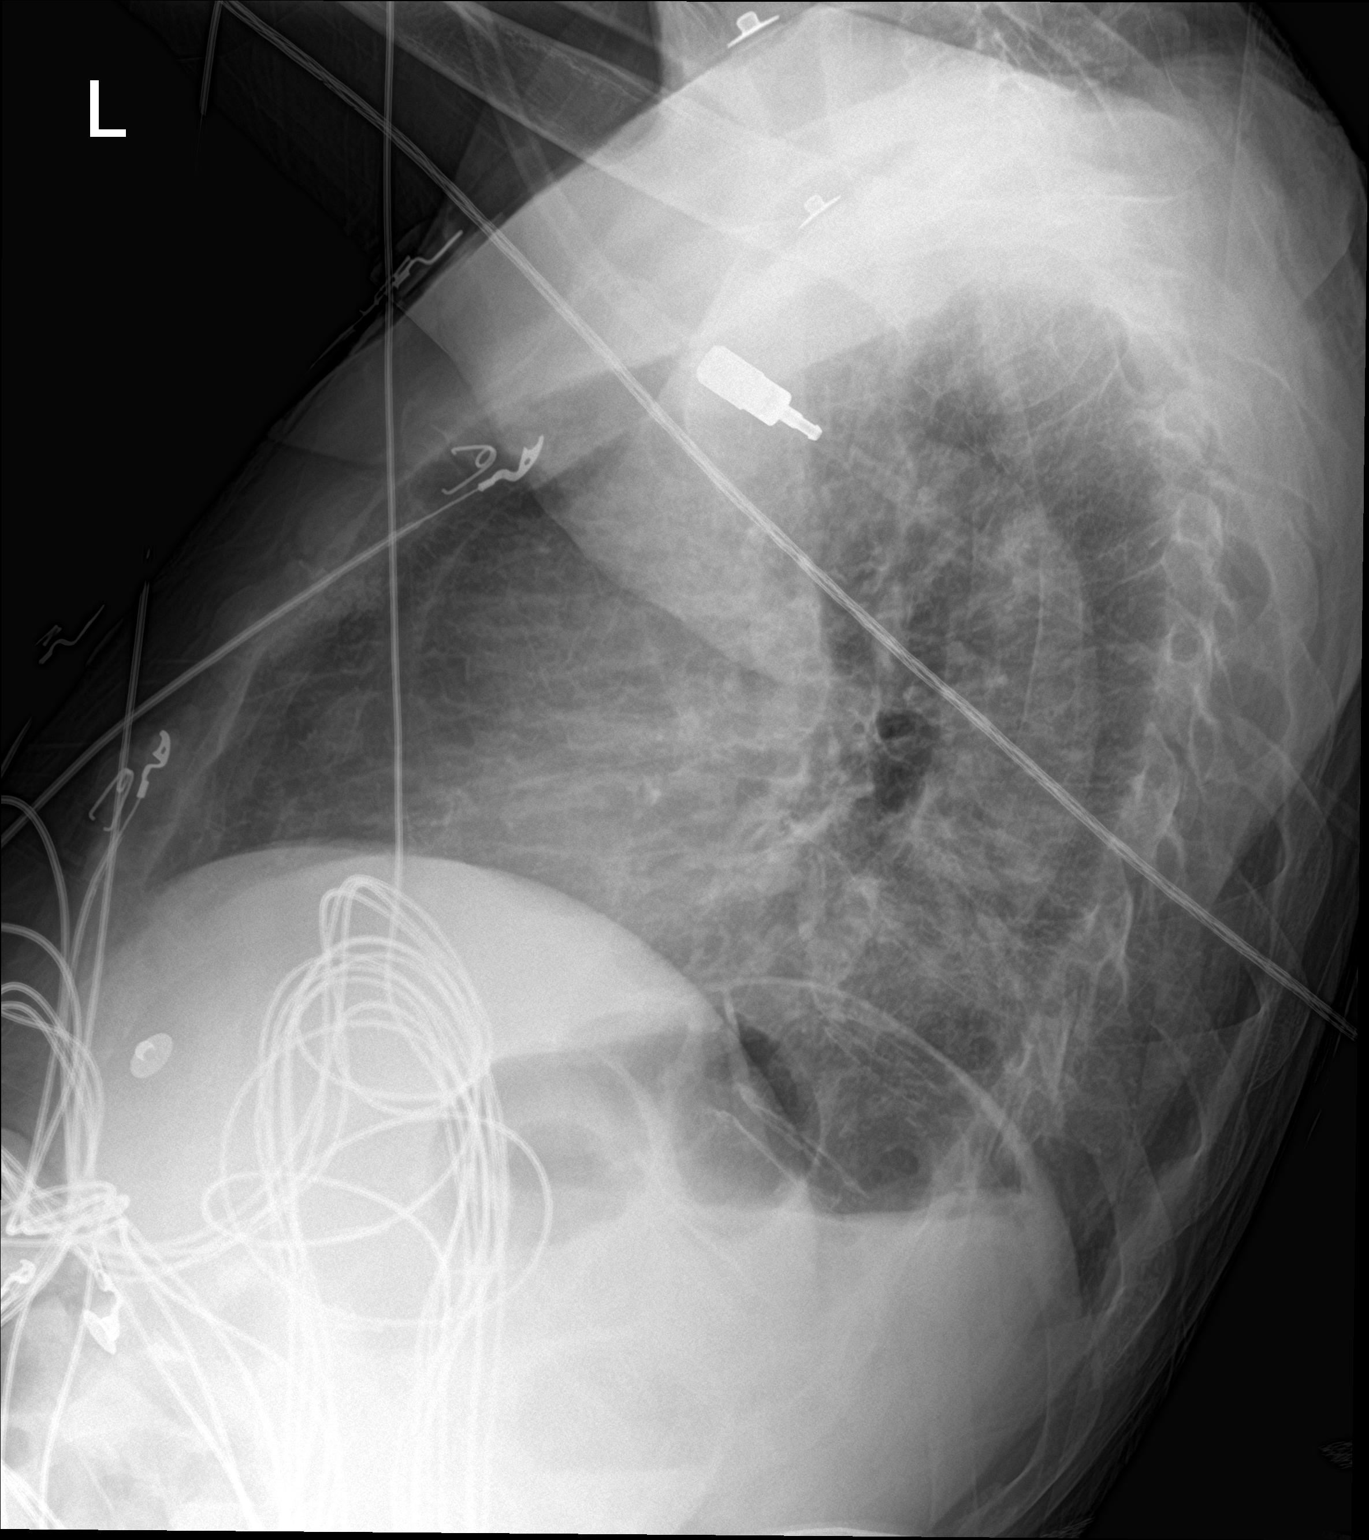

[chest ap]
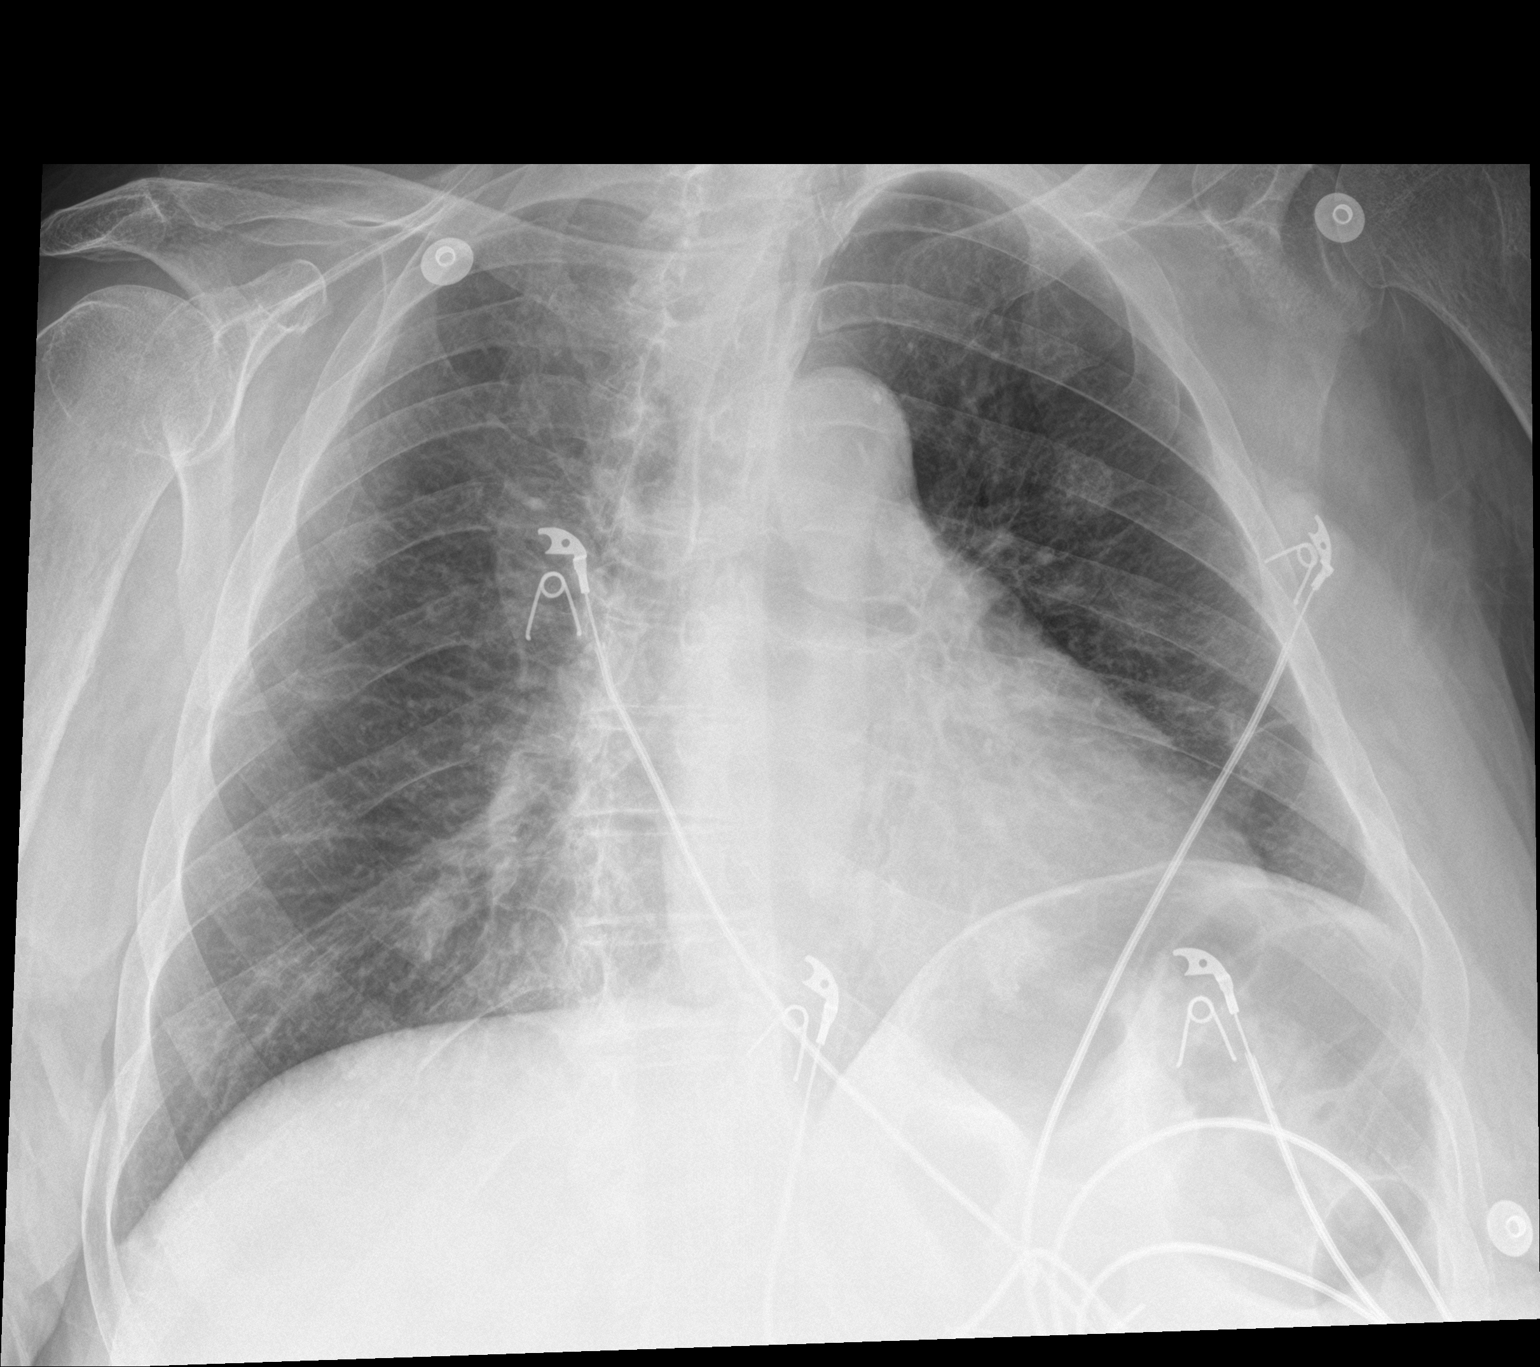

[2 of 2 positions shown; findings below may reference images not displayed]

FINDINGS: The patient is rotated to the left with grossly unchanged
cardiomediastinal silhouette. Mild prominence of the interstitial
markings is similar to the prior study, and there is mild
atelectasis or scarring in the left lung base. Allowing for patient
rotation, no acute airspace consolidation, overt pulmonary edema,
sizable pleural effusion, or pneumothorax is identified. No acute
osseous abnormality is seen.
IMPRESSION: Mild left basilar scarring or atelectasis. No definite evidence of
pneumonia on this rotated examination.
